# Patient Record
Sex: Female | Born: 1960 | Race: White | Hispanic: No | Smoking: Current every day smoker
Health system: Southern US, Community
[De-identification: ages and names within clinical notes are randomized; demographics above are authoritative.]

## PROBLEM LIST (undated history)

## (undated) DIAGNOSIS — F329 Major depressive disorder, single episode, unspecified: Secondary | ICD-10-CM

## (undated) DIAGNOSIS — G8929 Other chronic pain: Secondary | ICD-10-CM

## (undated) DIAGNOSIS — M549 Dorsalgia, unspecified: Secondary | ICD-10-CM

## (undated) DIAGNOSIS — M810 Age-related osteoporosis without current pathological fracture: Secondary | ICD-10-CM

## (undated) DIAGNOSIS — C539 Malignant neoplasm of cervix uteri, unspecified: Secondary | ICD-10-CM

## (undated) DIAGNOSIS — F32A Depression, unspecified: Secondary | ICD-10-CM

## (undated) DIAGNOSIS — F419 Anxiety disorder, unspecified: Secondary | ICD-10-CM

## (undated) DIAGNOSIS — J387 Other diseases of larynx: Secondary | ICD-10-CM

## (undated) DIAGNOSIS — M797 Fibromyalgia: Secondary | ICD-10-CM

## (undated) DIAGNOSIS — K219 Gastro-esophageal reflux disease without esophagitis: Secondary | ICD-10-CM

## (undated) DIAGNOSIS — K649 Unspecified hemorrhoids: Secondary | ICD-10-CM

## (undated) DIAGNOSIS — M1990 Unspecified osteoarthritis, unspecified site: Secondary | ICD-10-CM

## (undated) DIAGNOSIS — R51 Headache: Secondary | ICD-10-CM

## (undated) DIAGNOSIS — J449 Chronic obstructive pulmonary disease, unspecified: Secondary | ICD-10-CM

## (undated) HISTORY — DX: Unspecified hemorrhoids: K64.9

## (undated) HISTORY — DX: Anxiety disorder, unspecified: F41.9

## (undated) HISTORY — PX: ADENOIDECTOMY: SUR15

## (undated) HISTORY — DX: Malignant neoplasm of cervix uteri, unspecified: C53.9

## (undated) HISTORY — DX: Depression, unspecified: F32.A

## (undated) HISTORY — DX: Chronic obstructive pulmonary disease, unspecified: J44.9

## (undated) HISTORY — DX: Unspecified osteoarthritis, unspecified site: M19.90

## (undated) HISTORY — DX: Other chronic pain: G89.29

## (undated) HISTORY — PX: TONSILLECTOMY: SUR1361

## (undated) HISTORY — DX: Headache: R51

## (undated) HISTORY — DX: Major depressive disorder, single episode, unspecified: F32.9

## (undated) HISTORY — PX: CATARACT EXTRACTION W/ INTRAOCULAR LENS  IMPLANT, BILATERAL: SHX1307

## (undated) HISTORY — DX: Age-related osteoporosis without current pathological fracture: M81.0

## (undated) HISTORY — PX: CERVICAL CONE BIOPSY: SUR198

## (undated) HISTORY — DX: Gastro-esophageal reflux disease without esophagitis: K21.9

## (undated) HISTORY — DX: Dorsalgia, unspecified: M54.9

---

## 1998-01-07 ENCOUNTER — Encounter: Admission: RE | Admit: 1998-01-07 | Discharge: 1998-01-07 | Payer: Self-pay | Admitting: Internal Medicine

## 1998-01-21 ENCOUNTER — Encounter: Admission: RE | Admit: 1998-01-21 | Discharge: 1998-01-21 | Payer: Self-pay | Admitting: Internal Medicine

## 1998-02-06 ENCOUNTER — Encounter: Admission: RE | Admit: 1998-02-06 | Discharge: 1998-02-06 | Payer: Self-pay | Admitting: Internal Medicine

## 1998-03-03 ENCOUNTER — Encounter: Admission: RE | Admit: 1998-03-03 | Discharge: 1998-03-03 | Payer: Self-pay | Admitting: Internal Medicine

## 1998-03-28 ENCOUNTER — Emergency Department (HOSPITAL_COMMUNITY): Admission: EM | Admit: 1998-03-28 | Discharge: 1998-03-28 | Payer: Self-pay | Admitting: Emergency Medicine

## 1998-10-19 ENCOUNTER — Encounter: Admission: RE | Admit: 1998-10-19 | Discharge: 1998-10-19 | Payer: Self-pay | Admitting: Internal Medicine

## 1999-01-20 ENCOUNTER — Encounter: Admission: RE | Admit: 1999-01-20 | Discharge: 1999-01-20 | Payer: Self-pay | Admitting: Hematology and Oncology

## 1999-03-23 ENCOUNTER — Encounter: Admission: RE | Admit: 1999-03-23 | Discharge: 1999-03-23 | Payer: Self-pay | Admitting: Internal Medicine

## 1999-04-08 ENCOUNTER — Encounter: Admission: RE | Admit: 1999-04-08 | Discharge: 1999-04-08 | Payer: Self-pay | Admitting: Internal Medicine

## 1999-08-05 ENCOUNTER — Encounter: Admission: RE | Admit: 1999-08-05 | Discharge: 1999-08-05 | Payer: Self-pay | Admitting: Hematology and Oncology

## 1999-08-12 ENCOUNTER — Encounter: Admission: RE | Admit: 1999-08-12 | Discharge: 1999-08-12 | Payer: Self-pay | Admitting: Hematology and Oncology

## 1999-08-13 ENCOUNTER — Inpatient Hospital Stay (HOSPITAL_COMMUNITY): Admission: AD | Admit: 1999-08-13 | Discharge: 1999-08-14 | Payer: Self-pay | Admitting: Internal Medicine

## 1999-08-13 ENCOUNTER — Encounter: Payer: Self-pay | Admitting: Internal Medicine

## 1999-08-14 ENCOUNTER — Encounter: Payer: Self-pay | Admitting: Internal Medicine

## 1999-08-20 ENCOUNTER — Encounter: Admission: RE | Admit: 1999-08-20 | Discharge: 1999-08-20 | Payer: Self-pay | Admitting: Internal Medicine

## 1999-09-07 ENCOUNTER — Encounter: Admission: RE | Admit: 1999-09-07 | Discharge: 1999-09-07 | Payer: Self-pay | Admitting: Internal Medicine

## 1999-09-14 ENCOUNTER — Encounter: Admission: RE | Admit: 1999-09-14 | Discharge: 1999-09-14 | Payer: Self-pay | Admitting: Internal Medicine

## 1999-11-05 ENCOUNTER — Encounter: Admission: RE | Admit: 1999-11-05 | Discharge: 1999-11-05 | Payer: Self-pay | Admitting: Internal Medicine

## 1999-12-14 ENCOUNTER — Emergency Department (HOSPITAL_COMMUNITY): Admission: EM | Admit: 1999-12-14 | Discharge: 1999-12-14 | Payer: Self-pay | Admitting: *Deleted

## 1999-12-15 ENCOUNTER — Encounter: Admission: RE | Admit: 1999-12-15 | Discharge: 1999-12-15 | Payer: Self-pay | Admitting: Internal Medicine

## 1999-12-25 ENCOUNTER — Encounter: Payer: Self-pay | Admitting: Emergency Medicine

## 1999-12-25 ENCOUNTER — Emergency Department (HOSPITAL_COMMUNITY): Admission: EM | Admit: 1999-12-25 | Discharge: 1999-12-25 | Payer: Self-pay | Admitting: Emergency Medicine

## 2000-01-03 ENCOUNTER — Encounter: Payer: Self-pay | Admitting: Internal Medicine

## 2000-01-03 ENCOUNTER — Inpatient Hospital Stay (HOSPITAL_COMMUNITY): Admission: EM | Admit: 2000-01-03 | Discharge: 2000-01-04 | Payer: Self-pay | Admitting: Emergency Medicine

## 2000-01-04 ENCOUNTER — Encounter: Payer: Self-pay | Admitting: Internal Medicine

## 2000-02-07 ENCOUNTER — Encounter: Admission: RE | Admit: 2000-02-07 | Discharge: 2000-02-07 | Payer: Self-pay | Admitting: Internal Medicine

## 2000-03-14 ENCOUNTER — Encounter: Payer: Self-pay | Admitting: Emergency Medicine

## 2000-03-14 ENCOUNTER — Emergency Department (HOSPITAL_COMMUNITY): Admission: EM | Admit: 2000-03-14 | Discharge: 2000-03-14 | Payer: Self-pay | Admitting: Emergency Medicine

## 2000-04-10 ENCOUNTER — Encounter: Admission: RE | Admit: 2000-04-10 | Discharge: 2000-04-10 | Payer: Self-pay | Admitting: Internal Medicine

## 2000-05-17 ENCOUNTER — Emergency Department (HOSPITAL_COMMUNITY): Admission: EM | Admit: 2000-05-17 | Discharge: 2000-05-17 | Payer: Self-pay | Admitting: Emergency Medicine

## 2000-05-25 ENCOUNTER — Encounter: Admission: RE | Admit: 2000-05-25 | Discharge: 2000-05-25 | Payer: Self-pay

## 2000-07-03 ENCOUNTER — Encounter: Admission: RE | Admit: 2000-07-03 | Discharge: 2000-07-03 | Payer: Self-pay | Admitting: Internal Medicine

## 2000-07-10 ENCOUNTER — Encounter: Admission: RE | Admit: 2000-07-10 | Discharge: 2000-07-10 | Payer: Self-pay | Admitting: Internal Medicine

## 2000-07-28 ENCOUNTER — Emergency Department (HOSPITAL_COMMUNITY): Admission: EM | Admit: 2000-07-28 | Discharge: 2000-07-28 | Payer: Self-pay | Admitting: Emergency Medicine

## 2000-08-14 ENCOUNTER — Encounter: Admission: RE | Admit: 2000-08-14 | Discharge: 2000-08-14 | Payer: Self-pay | Admitting: Internal Medicine

## 2000-09-09 ENCOUNTER — Emergency Department (HOSPITAL_COMMUNITY): Admission: EM | Admit: 2000-09-09 | Discharge: 2000-09-09 | Payer: Self-pay | Admitting: Emergency Medicine

## 2000-09-18 ENCOUNTER — Encounter: Admission: RE | Admit: 2000-09-18 | Discharge: 2000-09-18 | Payer: Self-pay

## 2000-09-20 ENCOUNTER — Encounter: Admission: RE | Admit: 2000-09-20 | Discharge: 2000-09-27 | Payer: Self-pay | Admitting: Infectious Diseases

## 2000-09-28 ENCOUNTER — Encounter: Admission: RE | Admit: 2000-09-28 | Discharge: 2000-09-28 | Payer: Self-pay | Admitting: Hematology and Oncology

## 2000-10-05 ENCOUNTER — Encounter: Admission: RE | Admit: 2000-10-05 | Discharge: 2000-10-05 | Payer: Self-pay | Admitting: Hematology and Oncology

## 2000-11-20 ENCOUNTER — Emergency Department (HOSPITAL_COMMUNITY): Admission: EM | Admit: 2000-11-20 | Discharge: 2000-11-20 | Payer: Self-pay | Admitting: Emergency Medicine

## 2000-12-04 ENCOUNTER — Encounter: Admission: RE | Admit: 2000-12-04 | Discharge: 2000-12-04 | Payer: Self-pay | Admitting: Internal Medicine

## 2000-12-21 ENCOUNTER — Encounter: Admission: RE | Admit: 2000-12-21 | Discharge: 2000-12-21 | Payer: Self-pay | Admitting: Internal Medicine

## 2001-04-05 ENCOUNTER — Encounter: Admission: RE | Admit: 2001-04-05 | Discharge: 2001-04-05 | Payer: Self-pay | Admitting: Internal Medicine

## 2001-05-09 ENCOUNTER — Encounter: Admission: RE | Admit: 2001-05-09 | Discharge: 2001-05-09 | Payer: Self-pay | Admitting: Internal Medicine

## 2001-07-12 ENCOUNTER — Encounter: Admission: RE | Admit: 2001-07-12 | Discharge: 2001-07-12 | Payer: Self-pay | Admitting: Internal Medicine

## 2001-08-17 ENCOUNTER — Encounter: Admission: RE | Admit: 2001-08-17 | Discharge: 2001-08-17 | Payer: Self-pay | Admitting: Internal Medicine

## 2001-09-10 ENCOUNTER — Encounter: Admission: RE | Admit: 2001-09-10 | Discharge: 2001-09-10 | Payer: Self-pay | Admitting: Internal Medicine

## 2001-09-11 ENCOUNTER — Encounter: Admission: RE | Admit: 2001-09-11 | Discharge: 2001-09-11 | Payer: Self-pay | Admitting: Internal Medicine

## 2001-10-01 ENCOUNTER — Encounter: Admission: RE | Admit: 2001-10-01 | Discharge: 2001-10-01 | Payer: Self-pay | Admitting: Internal Medicine

## 2001-10-08 ENCOUNTER — Encounter: Admission: RE | Admit: 2001-10-08 | Discharge: 2001-10-08 | Payer: Self-pay

## 2001-11-15 ENCOUNTER — Encounter: Admission: RE | Admit: 2001-11-15 | Discharge: 2001-11-15 | Payer: Self-pay

## 2002-01-04 ENCOUNTER — Encounter: Admission: RE | Admit: 2002-01-04 | Discharge: 2002-01-04 | Payer: Self-pay | Admitting: Internal Medicine

## 2002-02-04 ENCOUNTER — Encounter: Admission: RE | Admit: 2002-02-04 | Discharge: 2002-02-04 | Payer: Self-pay | Admitting: Internal Medicine

## 2002-03-04 ENCOUNTER — Encounter: Admission: RE | Admit: 2002-03-04 | Discharge: 2002-03-04 | Payer: Self-pay | Admitting: Internal Medicine

## 2002-03-04 ENCOUNTER — Ambulatory Visit (HOSPITAL_COMMUNITY): Admission: RE | Admit: 2002-03-04 | Discharge: 2002-03-04 | Payer: Self-pay | Admitting: Internal Medicine

## 2002-04-04 ENCOUNTER — Emergency Department (HOSPITAL_COMMUNITY): Admission: EM | Admit: 2002-04-04 | Discharge: 2002-04-05 | Payer: Self-pay | Admitting: Emergency Medicine

## 2002-04-10 ENCOUNTER — Encounter: Admission: RE | Admit: 2002-04-10 | Discharge: 2002-04-10 | Payer: Self-pay | Admitting: Internal Medicine

## 2002-05-01 ENCOUNTER — Encounter: Admission: RE | Admit: 2002-05-01 | Discharge: 2002-05-01 | Payer: Self-pay | Admitting: Internal Medicine

## 2002-06-24 ENCOUNTER — Encounter: Admission: RE | Admit: 2002-06-24 | Discharge: 2002-06-24 | Payer: Self-pay | Admitting: Internal Medicine

## 2002-08-09 ENCOUNTER — Encounter: Admission: RE | Admit: 2002-08-09 | Discharge: 2002-08-09 | Payer: Self-pay | Admitting: Internal Medicine

## 2002-08-29 ENCOUNTER — Emergency Department (HOSPITAL_COMMUNITY): Admission: EM | Admit: 2002-08-29 | Discharge: 2002-08-30 | Payer: Self-pay | Admitting: Emergency Medicine

## 2002-09-03 ENCOUNTER — Encounter: Admission: RE | Admit: 2002-09-03 | Discharge: 2002-09-03 | Payer: Self-pay | Admitting: Internal Medicine

## 2002-11-19 ENCOUNTER — Emergency Department (HOSPITAL_COMMUNITY): Admission: EM | Admit: 2002-11-19 | Discharge: 2002-11-19 | Payer: Self-pay | Admitting: *Deleted

## 2002-11-19 ENCOUNTER — Encounter: Payer: Self-pay | Admitting: *Deleted

## 2002-11-19 ENCOUNTER — Encounter: Payer: Self-pay | Admitting: Emergency Medicine

## 2003-01-10 ENCOUNTER — Ambulatory Visit (HOSPITAL_COMMUNITY): Admission: RE | Admit: 2003-01-10 | Discharge: 2003-01-10 | Payer: Self-pay | Admitting: Internal Medicine

## 2003-01-10 ENCOUNTER — Encounter: Admission: RE | Admit: 2003-01-10 | Discharge: 2003-01-10 | Payer: Self-pay | Admitting: Internal Medicine

## 2003-01-10 ENCOUNTER — Encounter: Payer: Self-pay | Admitting: Internal Medicine

## 2003-02-14 ENCOUNTER — Emergency Department (HOSPITAL_COMMUNITY): Admission: EM | Admit: 2003-02-14 | Discharge: 2003-02-14 | Payer: Self-pay | Admitting: Emergency Medicine

## 2003-02-14 ENCOUNTER — Encounter: Payer: Self-pay | Admitting: Emergency Medicine

## 2003-04-02 ENCOUNTER — Encounter: Admission: RE | Admit: 2003-04-02 | Discharge: 2003-04-02 | Payer: Self-pay | Admitting: Internal Medicine

## 2003-04-10 ENCOUNTER — Encounter: Admission: RE | Admit: 2003-04-10 | Discharge: 2003-04-10 | Payer: Self-pay | Admitting: Internal Medicine

## 2003-04-29 ENCOUNTER — Encounter: Admission: RE | Admit: 2003-04-29 | Discharge: 2003-04-29 | Payer: Self-pay | Admitting: Internal Medicine

## 2003-05-08 ENCOUNTER — Encounter: Admission: RE | Admit: 2003-05-08 | Discharge: 2003-05-08 | Payer: Self-pay | Admitting: Internal Medicine

## 2003-06-04 ENCOUNTER — Encounter: Admission: RE | Admit: 2003-06-04 | Discharge: 2003-06-04 | Payer: Self-pay | Admitting: Internal Medicine

## 2003-07-17 ENCOUNTER — Encounter: Admission: RE | Admit: 2003-07-17 | Discharge: 2003-07-17 | Payer: Self-pay | Admitting: Internal Medicine

## 2003-08-18 ENCOUNTER — Encounter: Admission: RE | Admit: 2003-08-18 | Discharge: 2003-08-18 | Payer: Self-pay | Admitting: Internal Medicine

## 2003-09-06 ENCOUNTER — Emergency Department (HOSPITAL_COMMUNITY): Admission: EM | Admit: 2003-09-06 | Discharge: 2003-09-06 | Payer: Self-pay | Admitting: Emergency Medicine

## 2003-10-12 ENCOUNTER — Emergency Department (HOSPITAL_COMMUNITY): Admission: AD | Admit: 2003-10-12 | Discharge: 2003-10-12 | Payer: Self-pay | Admitting: Family Medicine

## 2003-10-14 ENCOUNTER — Encounter: Admission: RE | Admit: 2003-10-14 | Discharge: 2003-10-14 | Payer: Self-pay | Admitting: Internal Medicine

## 2003-11-13 ENCOUNTER — Inpatient Hospital Stay (HOSPITAL_COMMUNITY): Admission: EM | Admit: 2003-11-13 | Discharge: 2003-11-14 | Payer: Self-pay | Admitting: Family Medicine

## 2003-12-11 ENCOUNTER — Encounter: Admission: RE | Admit: 2003-12-11 | Discharge: 2003-12-11 | Payer: Self-pay | Admitting: Internal Medicine

## 2004-01-07 ENCOUNTER — Encounter: Admission: RE | Admit: 2004-01-07 | Discharge: 2004-01-07 | Payer: Self-pay | Admitting: Internal Medicine

## 2004-03-16 ENCOUNTER — Encounter: Admission: RE | Admit: 2004-03-16 | Discharge: 2004-03-16 | Payer: Self-pay | Admitting: Internal Medicine

## 2004-03-16 ENCOUNTER — Ambulatory Visit: Payer: Self-pay | Admitting: Internal Medicine

## 2004-05-25 ENCOUNTER — Ambulatory Visit: Payer: Self-pay | Admitting: Internal Medicine

## 2004-07-10 ENCOUNTER — Emergency Department (HOSPITAL_COMMUNITY): Admission: EM | Admit: 2004-07-10 | Discharge: 2004-07-10 | Payer: Self-pay | Admitting: Family Medicine

## 2004-08-10 ENCOUNTER — Inpatient Hospital Stay (HOSPITAL_COMMUNITY): Admission: AD | Admit: 2004-08-10 | Discharge: 2004-08-12 | Payer: Self-pay | Admitting: Internal Medicine

## 2004-08-10 ENCOUNTER — Ambulatory Visit: Payer: Self-pay | Admitting: Internal Medicine

## 2004-08-23 ENCOUNTER — Ambulatory Visit: Payer: Self-pay | Admitting: Internal Medicine

## 2004-08-30 ENCOUNTER — Ambulatory Visit: Payer: Self-pay | Admitting: Internal Medicine

## 2004-11-16 ENCOUNTER — Ambulatory Visit: Payer: Self-pay | Admitting: Internal Medicine

## 2004-11-25 ENCOUNTER — Ambulatory Visit: Payer: Self-pay | Admitting: Internal Medicine

## 2004-12-18 ENCOUNTER — Emergency Department (HOSPITAL_COMMUNITY): Admission: EM | Admit: 2004-12-18 | Discharge: 2004-12-18 | Payer: Self-pay | Admitting: Family Medicine

## 2004-12-19 ENCOUNTER — Emergency Department (HOSPITAL_COMMUNITY): Admission: EM | Admit: 2004-12-19 | Discharge: 2004-12-19 | Payer: Self-pay | Admitting: Family Medicine

## 2005-01-04 ENCOUNTER — Ambulatory Visit: Payer: Self-pay | Admitting: Internal Medicine

## 2005-02-11 ENCOUNTER — Ambulatory Visit: Payer: Self-pay | Admitting: Internal Medicine

## 2005-02-21 ENCOUNTER — Ambulatory Visit: Payer: Self-pay | Admitting: Internal Medicine

## 2005-03-10 ENCOUNTER — Ambulatory Visit: Payer: Self-pay | Admitting: Internal Medicine

## 2005-03-25 ENCOUNTER — Ambulatory Visit: Payer: Self-pay | Admitting: Internal Medicine

## 2005-04-12 ENCOUNTER — Ambulatory Visit: Payer: Self-pay | Admitting: Internal Medicine

## 2005-05-23 ENCOUNTER — Ambulatory Visit: Payer: Self-pay | Admitting: Internal Medicine

## 2005-05-23 ENCOUNTER — Ambulatory Visit (HOSPITAL_COMMUNITY): Admission: RE | Admit: 2005-05-23 | Discharge: 2005-05-23 | Payer: Self-pay | Admitting: Internal Medicine

## 2005-06-03 ENCOUNTER — Encounter: Payer: Self-pay | Admitting: Cardiology

## 2005-06-03 ENCOUNTER — Ambulatory Visit (HOSPITAL_COMMUNITY): Admission: RE | Admit: 2005-06-03 | Discharge: 2005-06-03 | Payer: Self-pay | Admitting: Internal Medicine

## 2005-06-03 ENCOUNTER — Ambulatory Visit: Payer: Self-pay | Admitting: Cardiology

## 2005-06-10 ENCOUNTER — Ambulatory Visit: Payer: Self-pay | Admitting: Internal Medicine

## 2005-06-27 ENCOUNTER — Ambulatory Visit: Payer: Self-pay | Admitting: Internal Medicine

## 2005-08-01 ENCOUNTER — Emergency Department (HOSPITAL_COMMUNITY): Admission: EM | Admit: 2005-08-01 | Discharge: 2005-08-01 | Payer: Self-pay | Admitting: Family Medicine

## 2005-10-02 ENCOUNTER — Emergency Department (HOSPITAL_COMMUNITY): Admission: AD | Admit: 2005-10-02 | Discharge: 2005-10-02 | Payer: Self-pay | Admitting: Family Medicine

## 2005-10-04 ENCOUNTER — Emergency Department (HOSPITAL_COMMUNITY): Admission: EM | Admit: 2005-10-04 | Discharge: 2005-10-04 | Payer: Self-pay | Admitting: Family Medicine

## 2005-10-11 ENCOUNTER — Ambulatory Visit: Payer: Self-pay | Admitting: Hospitalist

## 2006-03-28 ENCOUNTER — Other Ambulatory Visit: Admission: RE | Admit: 2006-03-28 | Discharge: 2006-03-28 | Payer: Self-pay | Admitting: Obstetrics and Gynecology

## 2006-04-05 ENCOUNTER — Encounter: Admission: RE | Admit: 2006-04-05 | Discharge: 2006-04-05 | Payer: Self-pay | Admitting: Obstetrics and Gynecology

## 2006-04-12 ENCOUNTER — Encounter: Admission: RE | Admit: 2006-04-12 | Discharge: 2006-04-12 | Payer: Self-pay | Admitting: Obstetrics and Gynecology

## 2006-06-09 ENCOUNTER — Ambulatory Visit (HOSPITAL_COMMUNITY): Admission: RE | Admit: 2006-06-09 | Discharge: 2006-06-09 | Payer: Self-pay | Admitting: Obstetrics and Gynecology

## 2006-06-09 ENCOUNTER — Encounter (INDEPENDENT_AMBULATORY_CARE_PROVIDER_SITE_OTHER): Payer: Self-pay | Admitting: Specialist

## 2006-08-15 ENCOUNTER — Emergency Department (HOSPITAL_COMMUNITY): Admission: EM | Admit: 2006-08-15 | Discharge: 2006-08-15 | Payer: Self-pay | Admitting: Emergency Medicine

## 2006-10-11 ENCOUNTER — Encounter: Admission: RE | Admit: 2006-10-11 | Discharge: 2006-10-11 | Payer: Self-pay | Admitting: Obstetrics and Gynecology

## 2006-11-09 ENCOUNTER — Emergency Department (HOSPITAL_COMMUNITY): Admission: EM | Admit: 2006-11-09 | Discharge: 2006-11-09 | Payer: Self-pay | Admitting: Family Medicine

## 2006-11-10 ENCOUNTER — Emergency Department (HOSPITAL_COMMUNITY): Admission: EM | Admit: 2006-11-10 | Discharge: 2006-11-10 | Payer: Self-pay | Admitting: Emergency Medicine

## 2006-12-01 ENCOUNTER — Emergency Department (HOSPITAL_COMMUNITY): Admission: EM | Admit: 2006-12-01 | Discharge: 2006-12-01 | Payer: Self-pay | Admitting: Emergency Medicine

## 2007-01-25 ENCOUNTER — Emergency Department (HOSPITAL_COMMUNITY): Admission: EM | Admit: 2007-01-25 | Discharge: 2007-01-25 | Payer: Self-pay | Admitting: Emergency Medicine

## 2007-02-27 ENCOUNTER — Encounter: Payer: Self-pay | Admitting: Internal Medicine

## 2007-02-27 ENCOUNTER — Ambulatory Visit: Payer: Self-pay | Admitting: Hospitalist

## 2007-02-27 DIAGNOSIS — J449 Chronic obstructive pulmonary disease, unspecified: Secondary | ICD-10-CM | POA: Insufficient documentation

## 2007-02-27 DIAGNOSIS — R51 Headache: Secondary | ICD-10-CM

## 2007-02-27 DIAGNOSIS — Z9189 Other specified personal risk factors, not elsewhere classified: Secondary | ICD-10-CM

## 2007-02-27 DIAGNOSIS — R5383 Other fatigue: Secondary | ICD-10-CM

## 2007-02-27 DIAGNOSIS — F418 Other specified anxiety disorders: Secondary | ICD-10-CM

## 2007-02-27 DIAGNOSIS — R519 Headache, unspecified: Secondary | ICD-10-CM | POA: Insufficient documentation

## 2007-02-27 DIAGNOSIS — R5381 Other malaise: Secondary | ICD-10-CM | POA: Insufficient documentation

## 2007-02-27 LAB — CONVERTED CEMR LAB
Basophils Relative: 1 % (ref 0–1)
Eosinophils Absolute: 0.1 10*3/uL (ref 0.0–0.7)
MCHC: 33.6 g/dL (ref 30.0–36.0)
MCV: 96.2 fL (ref 78.0–100.0)
Neutrophils Relative %: 53 % (ref 43–77)
Platelets: 287 10*3/uL (ref 150–400)
RBC Folate: 529 ng/mL (ref 180–600)
RDW: 12.9 % (ref 11.5–14.0)

## 2007-02-28 ENCOUNTER — Ambulatory Visit: Payer: Self-pay | Admitting: Hospitalist

## 2007-02-28 ENCOUNTER — Ambulatory Visit (HOSPITAL_COMMUNITY): Admission: RE | Admit: 2007-02-28 | Discharge: 2007-02-28 | Payer: Self-pay | Admitting: *Deleted

## 2007-02-28 ENCOUNTER — Encounter: Payer: Self-pay | Admitting: Internal Medicine

## 2007-02-28 DIAGNOSIS — E569 Vitamin deficiency, unspecified: Secondary | ICD-10-CM

## 2007-02-28 DIAGNOSIS — Z860100 Personal history of colon polyps, unspecified: Secondary | ICD-10-CM | POA: Insufficient documentation

## 2007-02-28 DIAGNOSIS — D509 Iron deficiency anemia, unspecified: Secondary | ICD-10-CM

## 2007-02-28 LAB — CONVERTED CEMR LAB
AST: 14 units/L (ref 0–37)
Albumin: 4.6 g/dL (ref 3.5–5.2)
BUN: 15 mg/dL (ref 6–23)
Bilirubin Urine: NEGATIVE
CO2: 25 meq/L (ref 19–32)
Calcium: 9.5 mg/dL (ref 8.4–10.5)
Chloride: 106 meq/L (ref 96–112)
Cholesterol: 190 mg/dL (ref 0–200)
HDL: 58 mg/dL (ref >39)
Nitrite: NEGATIVE
Potassium: 5.1 meq/L (ref 3.5–5.3)
Specific Gravity, Urine: 1.02 (ref 1.005–1.03)
TSH: 1.258 microintl units/mL (ref 0.350–5.50)
Urobilinogen, UA: 1 (ref 0.0–1.0)
Vitamin B-12: 268 pg/mL (ref 211–911)

## 2007-03-02 LAB — CONVERTED CEMR LAB: Homocysteine: 10.1 micromoles/L (ref 4.0–15.4)

## 2007-03-05 LAB — CONVERTED CEMR LAB: OCCULT 1: NEGATIVE

## 2007-03-07 ENCOUNTER — Ambulatory Visit: Payer: Self-pay | Admitting: Internal Medicine

## 2007-03-09 ENCOUNTER — Ambulatory Visit: Payer: Self-pay | Admitting: Internal Medicine

## 2007-03-14 ENCOUNTER — Telehealth (INDEPENDENT_AMBULATORY_CARE_PROVIDER_SITE_OTHER): Payer: Self-pay | Admitting: Pharmacy Technician

## 2007-03-14 ENCOUNTER — Ambulatory Visit (HOSPITAL_COMMUNITY): Admission: RE | Admit: 2007-03-14 | Discharge: 2007-03-14 | Payer: Self-pay | Admitting: Internal Medicine

## 2007-03-20 ENCOUNTER — Encounter: Payer: Self-pay | Admitting: Internal Medicine

## 2007-03-27 ENCOUNTER — Telehealth: Payer: Self-pay | Admitting: *Deleted

## 2007-04-05 ENCOUNTER — Ambulatory Visit: Payer: Self-pay | Admitting: Internal Medicine

## 2007-05-15 ENCOUNTER — Telehealth (INDEPENDENT_AMBULATORY_CARE_PROVIDER_SITE_OTHER): Payer: Self-pay | Admitting: *Deleted

## 2007-06-21 ENCOUNTER — Encounter (INDEPENDENT_AMBULATORY_CARE_PROVIDER_SITE_OTHER): Payer: Self-pay | Admitting: Internal Medicine

## 2007-06-21 ENCOUNTER — Ambulatory Visit: Payer: Self-pay | Admitting: Internal Medicine

## 2007-06-22 LAB — CONVERTED CEMR LAB
HCT: 44.8 % (ref 36.0–46.0)
MCV: 98.7 fL (ref 78.0–100.0)
RBC: 4.54 M/uL (ref 3.87–5.11)
Sed Rate: 16 mm/hr (ref 0–22)
WBC: 6.9 10*3/uL (ref 4.0–10.5)

## 2007-07-16 ENCOUNTER — Ambulatory Visit: Payer: Self-pay | Admitting: Internal Medicine

## 2007-07-19 ENCOUNTER — Ambulatory Visit (HOSPITAL_COMMUNITY): Admission: RE | Admit: 2007-07-19 | Discharge: 2007-07-19 | Payer: Self-pay | Admitting: Infectious Diseases

## 2007-07-19 ENCOUNTER — Telehealth: Payer: Self-pay | Admitting: *Deleted

## 2007-07-19 ENCOUNTER — Ambulatory Visit: Payer: Self-pay | Admitting: Hospitalist

## 2007-08-10 ENCOUNTER — Ambulatory Visit: Payer: Self-pay | Admitting: Internal Medicine

## 2007-08-14 ENCOUNTER — Ambulatory Visit (HOSPITAL_COMMUNITY): Admission: RE | Admit: 2007-08-14 | Discharge: 2007-08-14 | Payer: Self-pay | Admitting: Internal Medicine

## 2007-08-15 ENCOUNTER — Encounter (INDEPENDENT_AMBULATORY_CARE_PROVIDER_SITE_OTHER): Payer: Self-pay | Admitting: Internal Medicine

## 2007-08-15 ENCOUNTER — Ambulatory Visit (HOSPITAL_COMMUNITY): Admission: RE | Admit: 2007-08-15 | Discharge: 2007-08-15 | Payer: Self-pay | Admitting: Internal Medicine

## 2007-08-17 ENCOUNTER — Ambulatory Visit: Payer: Self-pay | Admitting: Internal Medicine

## 2007-09-21 ENCOUNTER — Telehealth: Payer: Self-pay | Admitting: *Deleted

## 2007-10-17 ENCOUNTER — Telehealth (INDEPENDENT_AMBULATORY_CARE_PROVIDER_SITE_OTHER): Payer: Self-pay | Admitting: Internal Medicine

## 2007-11-01 ENCOUNTER — Ambulatory Visit: Payer: Self-pay | Admitting: Internal Medicine

## 2007-11-01 ENCOUNTER — Encounter (INDEPENDENT_AMBULATORY_CARE_PROVIDER_SITE_OTHER): Payer: Self-pay | Admitting: Internal Medicine

## 2007-11-01 DIAGNOSIS — IMO0001 Reserved for inherently not codable concepts without codable children: Secondary | ICD-10-CM

## 2007-11-01 LAB — CONVERTED CEMR LAB
ALT: 10 units/L (ref 0–35)
Albumin: 4.5 g/dL (ref 3.5–5.2)
Alkaline Phosphatase: 77 units/L (ref 39–117)
Glucose, Bld: 82 mg/dL (ref 70–99)
Potassium: 4.9 meq/L (ref 3.5–5.3)
Sodium: 145 meq/L (ref 135–145)
Total Protein: 7.3 g/dL (ref 6.0–8.3)

## 2007-11-06 ENCOUNTER — Telehealth: Payer: Self-pay | Admitting: *Deleted

## 2007-11-16 ENCOUNTER — Ambulatory Visit: Payer: Self-pay | Admitting: Internal Medicine

## 2007-11-16 ENCOUNTER — Encounter: Payer: Self-pay | Admitting: *Deleted

## 2007-11-16 ENCOUNTER — Encounter (INDEPENDENT_AMBULATORY_CARE_PROVIDER_SITE_OTHER): Payer: Self-pay | Admitting: Internal Medicine

## 2007-11-16 ENCOUNTER — Telehealth: Payer: Self-pay | Admitting: *Deleted

## 2007-11-16 ENCOUNTER — Encounter: Admission: RE | Admit: 2007-11-16 | Discharge: 2007-11-16 | Payer: Self-pay | Admitting: Internal Medicine

## 2007-11-19 LAB — CONVERTED CEMR LAB
Cholesterol: 212 mg/dL — ABNORMAL HIGH (ref 0–200)
LDL Cholesterol: 118 mg/dL — ABNORMAL HIGH (ref 0–99)
VLDL: 13 mg/dL (ref 0–40)

## 2007-12-11 ENCOUNTER — Emergency Department (HOSPITAL_COMMUNITY): Admission: EM | Admit: 2007-12-11 | Discharge: 2007-12-11 | Payer: Self-pay | Admitting: Emergency Medicine

## 2007-12-13 ENCOUNTER — Ambulatory Visit: Payer: Self-pay | Admitting: Internal Medicine

## 2007-12-13 ENCOUNTER — Encounter (INDEPENDENT_AMBULATORY_CARE_PROVIDER_SITE_OTHER): Payer: Self-pay | Admitting: Internal Medicine

## 2007-12-13 ENCOUNTER — Encounter: Payer: Self-pay | Admitting: Internal Medicine

## 2007-12-14 ENCOUNTER — Encounter: Payer: Self-pay | Admitting: Internal Medicine

## 2007-12-14 LAB — CONVERTED CEMR LAB
Albumin: 4.7 g/dL (ref 3.5–5.2)
BUN: 21 mg/dL (ref 6–23)
CO2: 28 meq/L (ref 19–32)
Calcium: 9.7 mg/dL (ref 8.4–10.5)
Chloride: 105 meq/L (ref 96–112)
Creatinine, Ser: 0.85 mg/dL (ref 0.40–1.20)
Glucose, Bld: 88 mg/dL (ref 70–99)
Hemoglobin: 13.1 g/dL (ref 12.0–15.0)
Lymphocytes Relative: 42 % (ref 12–46)
Lymphs Abs: 5.1 10*3/uL — ABNORMAL HIGH (ref 0.7–4.0)
MCHC: 32.5 g/dL (ref 30.0–36.0)
Monocytes Absolute: 1 10*3/uL (ref 0.1–1.0)
Monocytes Relative: 8 % (ref 3–12)
Neutro Abs: 6 10*3/uL (ref 1.7–7.7)
Potassium: 4.6 meq/L (ref 3.5–5.3)
RBC: 4.12 M/uL (ref 3.87–5.11)
WBC: 12.2 10*3/uL — ABNORMAL HIGH (ref 4.0–10.5)

## 2007-12-18 ENCOUNTER — Telehealth: Payer: Self-pay | Admitting: *Deleted

## 2008-01-04 ENCOUNTER — Ambulatory Visit: Payer: Self-pay | Admitting: Internal Medicine

## 2008-01-04 ENCOUNTER — Encounter (INDEPENDENT_AMBULATORY_CARE_PROVIDER_SITE_OTHER): Payer: Self-pay | Admitting: *Deleted

## 2008-01-12 ENCOUNTER — Emergency Department (HOSPITAL_COMMUNITY): Admission: EM | Admit: 2008-01-12 | Discharge: 2008-01-12 | Payer: Self-pay | Admitting: Family Medicine

## 2008-01-15 ENCOUNTER — Inpatient Hospital Stay (HOSPITAL_COMMUNITY): Admission: EM | Admit: 2008-01-15 | Discharge: 2008-01-19 | Payer: Self-pay | Admitting: Emergency Medicine

## 2008-01-16 ENCOUNTER — Encounter: Payer: Self-pay | Admitting: Internal Medicine

## 2008-01-18 ENCOUNTER — Encounter: Payer: Self-pay | Admitting: Internal Medicine

## 2008-01-24 ENCOUNTER — Ambulatory Visit: Payer: Self-pay | Admitting: Internal Medicine

## 2008-02-08 ENCOUNTER — Ambulatory Visit: Payer: Self-pay | Admitting: *Deleted

## 2008-02-11 ENCOUNTER — Telehealth: Payer: Self-pay | Admitting: Internal Medicine

## 2008-02-19 ENCOUNTER — Encounter: Payer: Self-pay | Admitting: Internal Medicine

## 2008-03-11 ENCOUNTER — Encounter (INDEPENDENT_AMBULATORY_CARE_PROVIDER_SITE_OTHER): Payer: Self-pay | Admitting: *Deleted

## 2008-03-11 ENCOUNTER — Ambulatory Visit: Payer: Self-pay | Admitting: *Deleted

## 2008-03-11 LAB — CONVERTED CEMR LAB
ALT: 11 U/L
AST: 16 U/L
Albumin: 4.3 g/dL
Alkaline Phosphatase: 78 U/L
BUN: 17 mg/dL
Basophils Absolute: 0 K/uL
Basophils Relative: 0 %
Bilirubin Urine: NEGATIVE
Bilirubin Urine: NEGATIVE
Blood in Urine, dipstick: NEGATIVE
CO2: 26 meq/L
Calcium: 9.4 mg/dL
Chloride: 103 meq/L
Creatinine, Ser: 0.78 mg/dL
Eosinophils Absolute: 0.1 K/uL
Eosinophils Relative: 2 %
Ferritin: 96 ng/mL
Free T4: 1.06 ng/dL
Glucose, Bld: 102 mg/dL — ABNORMAL HIGH
Glucose, Urine, Semiquant: NEGATIVE
HCT: 40.4 %
Hemoglobin, Urine: NEGATIVE
Hemoglobin: 12.8 g/dL
Iron: 75 ug/dL
Ketones, ur: NEGATIVE mg/dL
Ketones, urine, test strip: NEGATIVE
Lymphocytes Relative: 39 %
Lymphs Abs: 2.1 K/uL
MCHC: 31.7 g/dL
MCV: 98.3 fL
Monocytes Absolute: 0.5 K/uL
Monocytes Relative: 9 %
Neutro Abs: 2.6 K/uL
Neutrophils Relative %: 50 %
Nitrite: NEGATIVE
Nitrite: NEGATIVE
Platelets: 287 K/uL
Potassium: 4.4 meq/L
Protein, U semiquant: NEGATIVE
Protein, ur: NEGATIVE mg/dL
RBC / HPF: NONE SEEN
RBC: 4.11 M/uL
RDW: 13.7 %
Saturation Ratios: 24 %
Sodium: 140 meq/L
Specific Gravity, Urine: 1.025
Specific Gravity, Urine: 1.022
TIBC: 312 ug/dL
TSH: 1.031 u[IU]/mL
Total Bilirubin: 0.3 mg/dL
Total Protein: 7.1 g/dL
UIBC: 237 ug/dL
Urine Glucose: NEGATIVE mg/dL
Urobilinogen, UA: 0.2
Urobilinogen, UA: 0.2
Vitamin B-12: 349 pg/mL
WBC: 5.3 10*3/microliter
pH: 6
pH: 6

## 2008-03-18 ENCOUNTER — Telehealth: Payer: Self-pay | Admitting: Internal Medicine

## 2008-03-19 ENCOUNTER — Telehealth: Payer: Self-pay | Admitting: Internal Medicine

## 2008-04-18 ENCOUNTER — Telehealth (INDEPENDENT_AMBULATORY_CARE_PROVIDER_SITE_OTHER): Payer: Self-pay | Admitting: Pharmacy Technician

## 2008-04-30 ENCOUNTER — Ambulatory Visit: Payer: Self-pay | Admitting: Internal Medicine

## 2008-04-30 ENCOUNTER — Encounter: Payer: Self-pay | Admitting: Internal Medicine

## 2008-05-07 ENCOUNTER — Telehealth: Payer: Self-pay | Admitting: Internal Medicine

## 2008-05-09 ENCOUNTER — Encounter (INDEPENDENT_AMBULATORY_CARE_PROVIDER_SITE_OTHER): Payer: Self-pay | Admitting: Internal Medicine

## 2008-05-15 ENCOUNTER — Ambulatory Visit: Payer: Self-pay | Admitting: Infectious Disease

## 2008-05-21 ENCOUNTER — Encounter: Payer: Self-pay | Admitting: Licensed Clinical Social Worker

## 2008-06-03 ENCOUNTER — Telehealth: Payer: Self-pay | Admitting: *Deleted

## 2008-06-12 ENCOUNTER — Encounter: Payer: Self-pay | Admitting: Internal Medicine

## 2008-06-28 ENCOUNTER — Emergency Department (HOSPITAL_COMMUNITY): Admission: EM | Admit: 2008-06-28 | Discharge: 2008-06-28 | Payer: Self-pay | Admitting: Emergency Medicine

## 2008-06-29 ENCOUNTER — Emergency Department (HOSPITAL_COMMUNITY): Admission: EM | Admit: 2008-06-29 | Discharge: 2008-06-29 | Payer: Self-pay | Admitting: Family Medicine

## 2008-07-01 ENCOUNTER — Ambulatory Visit: Payer: Self-pay | Admitting: Internal Medicine

## 2008-07-01 DIAGNOSIS — M542 Cervicalgia: Secondary | ICD-10-CM

## 2008-08-11 ENCOUNTER — Ambulatory Visit: Payer: Self-pay | Admitting: Internal Medicine

## 2008-08-11 DIAGNOSIS — G894 Chronic pain syndrome: Secondary | ICD-10-CM

## 2008-08-25 ENCOUNTER — Encounter: Payer: Self-pay | Admitting: Internal Medicine

## 2008-09-08 ENCOUNTER — Telehealth: Payer: Self-pay | Admitting: Internal Medicine

## 2008-09-10 ENCOUNTER — Encounter: Payer: Self-pay | Admitting: Internal Medicine

## 2008-09-17 ENCOUNTER — Ambulatory Visit: Payer: Self-pay | Admitting: Internal Medicine

## 2008-09-29 ENCOUNTER — Encounter: Payer: Self-pay | Admitting: Internal Medicine

## 2008-10-06 ENCOUNTER — Encounter: Payer: Self-pay | Admitting: Internal Medicine

## 2008-10-20 ENCOUNTER — Encounter: Payer: Self-pay | Admitting: Internal Medicine

## 2008-10-27 ENCOUNTER — Ambulatory Visit: Payer: Self-pay | Admitting: Internal Medicine

## 2008-10-27 ENCOUNTER — Encounter: Payer: Self-pay | Admitting: Internal Medicine

## 2008-10-27 DIAGNOSIS — M7989 Other specified soft tissue disorders: Secondary | ICD-10-CM

## 2008-10-27 LAB — CONVERTED CEMR LAB
ALT: <8 units/L (ref 0–35)
CO2: 27 meq/L (ref 19–32)
Calcium: 9.2 mg/dL (ref 8.4–10.5)
Chloride: 104 meq/L (ref 96–112)
Cholesterol: 186 mg/dL (ref 0–200)
Creatinine, Ser: 0.68 mg/dL (ref 0.40–1.20)
GFR calc Af Amer: >60 mL/min (ref >60)
GFR calc non Af Amer: >60 mL/min (ref >60)
Glucose, Bld: 84 mg/dL (ref 70–99)
LDL Cholesterol: 117 mg/dL — ABNORMAL HIGH (ref 0–99)
Rhuematoid fact SerPl-aCnc: <20 intl units/mL (ref 0–20)
Total Bilirubin: 0.3 mg/dL (ref 0.3–1.2)
VLDL: 26 mg/dL (ref 0–40)

## 2008-11-07 ENCOUNTER — Encounter: Payer: Self-pay | Admitting: Internal Medicine

## 2008-12-01 ENCOUNTER — Ambulatory Visit: Payer: Self-pay | Admitting: Internal Medicine

## 2008-12-18 ENCOUNTER — Encounter: Payer: Self-pay | Admitting: Internal Medicine

## 2008-12-23 ENCOUNTER — Emergency Department (HOSPITAL_COMMUNITY): Admission: EM | Admit: 2008-12-23 | Discharge: 2008-12-23 | Payer: Self-pay | Admitting: Family Medicine

## 2008-12-31 ENCOUNTER — Encounter: Payer: Self-pay | Admitting: Internal Medicine

## 2008-12-31 ENCOUNTER — Ambulatory Visit: Payer: Self-pay | Admitting: Internal Medicine

## 2008-12-31 DIAGNOSIS — R928 Other abnormal and inconclusive findings on diagnostic imaging of breast: Secondary | ICD-10-CM

## 2008-12-31 LAB — CONVERTED CEMR LAB
ALT: <8 units/L (ref 0–35)
Albumin: 4.8 g/dL (ref 3.5–5.2)
Alkaline Phosphatase: 71 units/L (ref 39–117)
Basophils Absolute: 0 10*3/uL (ref 0.0–0.1)
Basophils Relative: 0 % (ref 0–1)
Eosinophils Absolute: 0.2 10*3/uL (ref 0.0–0.7)
Eosinophils Relative: 4 % (ref 0–5)
HCT: 43.9 % (ref 36.0–46.0)
Hemoglobin: 14.8 g/dL (ref 12.0–15.0)
MCHC: 33.7 g/dL (ref 30.0–36.0)
MCV: 93.8 fL (ref 78.0–100.0)
Monocytes Absolute: 0.5 10*3/uL (ref 0.1–1.0)
Potassium: 5 meq/L (ref 3.5–5.3)
RDW: 14 % (ref 11.5–15.5)
Sodium: 141 meq/L (ref 135–145)
Total Bilirubin: 0.4 mg/dL (ref 0.3–1.2)
Total Protein: 7.8 g/dL (ref 6.0–8.3)

## 2009-01-07 ENCOUNTER — Encounter: Admission: RE | Admit: 2009-01-07 | Discharge: 2009-01-07 | Payer: Self-pay | Admitting: Internal Medicine

## 2009-01-08 ENCOUNTER — Ambulatory Visit: Payer: Self-pay | Admitting: Internal Medicine

## 2009-01-08 DIAGNOSIS — R1319 Other dysphagia: Secondary | ICD-10-CM

## 2009-01-08 DIAGNOSIS — K219 Gastro-esophageal reflux disease without esophagitis: Secondary | ICD-10-CM | POA: Insufficient documentation

## 2009-01-12 ENCOUNTER — Ambulatory Visit: Payer: Self-pay | Admitting: Internal Medicine

## 2009-03-05 ENCOUNTER — Ambulatory Visit: Payer: Self-pay | Admitting: Internal Medicine

## 2009-03-05 LAB — CONVERTED CEMR LAB
Cyclic Citrullin Peptide Ab: 0.8 units (ref <7)
Sed Rate: 8 mm/hr (ref 0–22)

## 2009-03-18 ENCOUNTER — Telehealth: Payer: Self-pay | Admitting: Internal Medicine

## 2009-03-24 ENCOUNTER — Telehealth: Payer: Self-pay | Admitting: Internal Medicine

## 2009-03-25 ENCOUNTER — Encounter: Payer: Self-pay | Admitting: Internal Medicine

## 2009-04-17 ENCOUNTER — Ambulatory Visit (HOSPITAL_COMMUNITY): Admission: RE | Admit: 2009-04-17 | Discharge: 2009-04-17 | Payer: Self-pay | Admitting: Infectious Diseases

## 2009-04-17 ENCOUNTER — Ambulatory Visit: Payer: Self-pay | Admitting: Infectious Disease

## 2009-04-17 DIAGNOSIS — R05 Cough: Secondary | ICD-10-CM

## 2009-04-17 LAB — CONVERTED CEMR LAB
Eosinophils Relative: 1 % (ref 0–5)
HCT: 42 % (ref 36.0–46.0)
Hemoglobin: 14.2 g/dL (ref 12.0–15.0)
Lymphocytes Relative: 23 % (ref 12–46)
Lymphs Abs: 2.1 10*3/uL (ref 0.7–4.0)
Platelets: 323 10*3/uL (ref 150–400)
RBC: 4.41 M/uL (ref 3.87–5.11)
WBC: 9.4 10*3/uL (ref 4.0–10.5)

## 2009-04-27 ENCOUNTER — Telehealth: Payer: Self-pay | Admitting: *Deleted

## 2009-05-05 ENCOUNTER — Encounter: Payer: Self-pay | Admitting: Internal Medicine

## 2009-05-26 ENCOUNTER — Encounter: Payer: Self-pay | Admitting: Internal Medicine

## 2009-05-26 ENCOUNTER — Telehealth: Payer: Self-pay | Admitting: *Deleted

## 2009-06-04 ENCOUNTER — Encounter (INDEPENDENT_AMBULATORY_CARE_PROVIDER_SITE_OTHER): Payer: Self-pay | Admitting: Dermatology

## 2009-06-04 ENCOUNTER — Telehealth (INDEPENDENT_AMBULATORY_CARE_PROVIDER_SITE_OTHER): Payer: Self-pay | Admitting: *Deleted

## 2009-06-05 ENCOUNTER — Emergency Department (HOSPITAL_COMMUNITY): Admission: EM | Admit: 2009-06-05 | Discharge: 2009-06-05 | Payer: Self-pay | Admitting: Emergency Medicine

## 2009-06-23 ENCOUNTER — Telehealth (INDEPENDENT_AMBULATORY_CARE_PROVIDER_SITE_OTHER): Payer: Self-pay | Admitting: *Deleted

## 2009-06-23 ENCOUNTER — Encounter: Payer: Self-pay | Admitting: Infectious Disease

## 2009-07-01 ENCOUNTER — Telehealth: Payer: Self-pay | Admitting: Internal Medicine

## 2009-07-02 ENCOUNTER — Encounter: Payer: Self-pay | Admitting: Internal Medicine

## 2009-07-02 ENCOUNTER — Telehealth (INDEPENDENT_AMBULATORY_CARE_PROVIDER_SITE_OTHER): Payer: Self-pay | Admitting: *Deleted

## 2009-07-15 ENCOUNTER — Ambulatory Visit: Payer: Self-pay | Admitting: Internal Medicine

## 2009-07-28 ENCOUNTER — Telehealth: Payer: Self-pay | Admitting: *Deleted

## 2009-08-03 ENCOUNTER — Telehealth: Payer: Self-pay | Admitting: *Deleted

## 2009-08-27 ENCOUNTER — Encounter: Payer: Self-pay | Admitting: Internal Medicine

## 2009-09-07 ENCOUNTER — Encounter: Payer: Self-pay | Admitting: Internal Medicine

## 2009-09-10 ENCOUNTER — Ambulatory Visit: Payer: Self-pay | Admitting: Internal Medicine

## 2009-09-10 DIAGNOSIS — L408 Other psoriasis: Secondary | ICD-10-CM

## 2009-09-11 ENCOUNTER — Encounter: Payer: Self-pay | Admitting: Internal Medicine

## 2009-09-18 ENCOUNTER — Ambulatory Visit (HOSPITAL_COMMUNITY): Admission: RE | Admit: 2009-09-18 | Discharge: 2009-09-18 | Payer: Self-pay | Admitting: Internal Medicine

## 2009-09-18 ENCOUNTER — Encounter: Payer: Self-pay | Admitting: Internal Medicine

## 2009-09-28 ENCOUNTER — Encounter: Payer: Self-pay | Admitting: Internal Medicine

## 2009-09-28 DIAGNOSIS — J069 Acute upper respiratory infection, unspecified: Secondary | ICD-10-CM | POA: Insufficient documentation

## 2009-09-30 ENCOUNTER — Ambulatory Visit: Payer: Self-pay | Admitting: Infectious Diseases

## 2009-10-01 ENCOUNTER — Encounter: Payer: Self-pay | Admitting: Internal Medicine

## 2009-10-02 ENCOUNTER — Ambulatory Visit: Payer: Self-pay | Admitting: Internal Medicine

## 2009-10-02 ENCOUNTER — Telehealth: Payer: Self-pay | Admitting: Internal Medicine

## 2009-10-02 ENCOUNTER — Encounter: Payer: Self-pay | Admitting: Internal Medicine

## 2009-10-02 ENCOUNTER — Observation Stay (HOSPITAL_COMMUNITY): Admission: EM | Admit: 2009-10-02 | Discharge: 2009-10-07 | Payer: Self-pay | Admitting: Emergency Medicine

## 2009-10-05 ENCOUNTER — Encounter: Payer: Self-pay | Admitting: Internal Medicine

## 2009-10-08 ENCOUNTER — Telehealth (INDEPENDENT_AMBULATORY_CARE_PROVIDER_SITE_OTHER): Payer: Self-pay | Admitting: Internal Medicine

## 2009-10-08 ENCOUNTER — Ambulatory Visit: Payer: Self-pay | Admitting: Infectious Diseases

## 2009-10-14 ENCOUNTER — Telehealth: Payer: Self-pay | Admitting: Internal Medicine

## 2009-10-19 ENCOUNTER — Telehealth: Payer: Self-pay | Admitting: Internal Medicine

## 2009-10-22 ENCOUNTER — Encounter: Payer: Self-pay | Admitting: Internal Medicine

## 2009-10-28 ENCOUNTER — Ambulatory Visit: Payer: Self-pay | Admitting: Internal Medicine

## 2009-11-26 ENCOUNTER — Ambulatory Visit: Payer: Self-pay | Admitting: Internal Medicine

## 2010-01-14 ENCOUNTER — Telehealth: Payer: Self-pay | Admitting: Internal Medicine

## 2010-01-14 ENCOUNTER — Ambulatory Visit: Payer: Self-pay | Admitting: Internal Medicine

## 2010-01-14 ENCOUNTER — Ambulatory Visit (HOSPITAL_COMMUNITY): Admission: RE | Admit: 2010-01-14 | Discharge: 2010-01-14 | Payer: Self-pay | Admitting: Internal Medicine

## 2010-01-14 DIAGNOSIS — R0602 Shortness of breath: Secondary | ICD-10-CM | POA: Insufficient documentation

## 2010-01-14 DIAGNOSIS — R300 Dysuria: Secondary | ICD-10-CM

## 2010-01-14 DIAGNOSIS — R197 Diarrhea, unspecified: Secondary | ICD-10-CM | POA: Insufficient documentation

## 2010-01-14 LAB — CONVERTED CEMR LAB
ALT: 9 units/L (ref 0–35)
AST: 16 units/L (ref 0–37)
Albumin: 4.8 g/dL (ref 3.5–5.2)
BUN: 12 mg/dL (ref 6–23)
Basophils Absolute: 0 10*3/uL (ref 0.0–0.1)
Calcium: 10 mg/dL (ref 8.4–10.5)
Casts: NONE SEEN /lpf
Chloride: 103 meq/L (ref 96–112)
Free T4: 1.15 ng/dL (ref 0.80–1.80)
Hemoglobin, Urine: NEGATIVE
Ketones, ur: NEGATIVE mg/dL
Lymphocytes Relative: 41 % (ref 12–46)
Neutro Abs: 3.8 10*3/uL (ref 1.7–7.7)
Neutrophils Relative %: 52 % (ref 43–77)
Nitrite: NEGATIVE
Platelets: 315 10*3/uL (ref 150–400)
Potassium: 3.7 meq/L (ref 3.5–5.3)
RDW: 13.1 % (ref 11.5–15.5)
TSH: 0.676 microintl units/mL (ref 0.350–4.5)
Urobilinogen, UA: 1 (ref 0.0–1.0)

## 2010-03-11 ENCOUNTER — Ambulatory Visit: Payer: Self-pay | Admitting: Internal Medicine

## 2010-03-11 DIAGNOSIS — R599 Enlarged lymph nodes, unspecified: Secondary | ICD-10-CM | POA: Insufficient documentation

## 2010-04-02 ENCOUNTER — Telehealth (INDEPENDENT_AMBULATORY_CARE_PROVIDER_SITE_OTHER): Payer: Self-pay | Admitting: *Deleted

## 2010-04-29 ENCOUNTER — Observation Stay (HOSPITAL_COMMUNITY): Admission: EM | Admit: 2010-04-29 | Discharge: 2010-04-30 | Payer: Self-pay | Admitting: Internal Medicine

## 2010-04-29 ENCOUNTER — Ambulatory Visit: Payer: Self-pay | Admitting: Internal Medicine

## 2010-04-29 ENCOUNTER — Encounter: Payer: Self-pay | Admitting: Internal Medicine

## 2010-04-29 DIAGNOSIS — R079 Chest pain, unspecified: Secondary | ICD-10-CM

## 2010-04-30 ENCOUNTER — Encounter: Payer: Self-pay | Admitting: Internal Medicine

## 2010-05-01 DIAGNOSIS — K219 Gastro-esophageal reflux disease without esophagitis: Secondary | ICD-10-CM

## 2010-05-01 HISTORY — DX: Gastro-esophageal reflux disease without esophagitis: K21.9

## 2010-05-03 ENCOUNTER — Encounter: Payer: Self-pay | Admitting: Internal Medicine

## 2010-05-10 ENCOUNTER — Telehealth: Payer: Self-pay | Admitting: Internal Medicine

## 2010-05-19 ENCOUNTER — Ambulatory Visit: Payer: Self-pay | Admitting: Internal Medicine

## 2010-05-21 ENCOUNTER — Ambulatory Visit (HOSPITAL_COMMUNITY): Admission: RE | Admit: 2010-05-21 | Discharge: 2010-05-21 | Payer: Self-pay | Admitting: Internal Medicine

## 2010-05-26 ENCOUNTER — Telehealth (INDEPENDENT_AMBULATORY_CARE_PROVIDER_SITE_OTHER): Payer: Self-pay

## 2010-05-27 ENCOUNTER — Encounter: Payer: Self-pay | Admitting: Cardiology

## 2010-05-27 ENCOUNTER — Ambulatory Visit: Payer: Self-pay | Admitting: Cardiology

## 2010-05-27 ENCOUNTER — Encounter (HOSPITAL_COMMUNITY)
Admission: RE | Admit: 2010-05-27 | Discharge: 2010-07-31 | Payer: Self-pay | Source: Home / Self Care | Attending: Internal Medicine | Admitting: Internal Medicine

## 2010-05-27 ENCOUNTER — Ambulatory Visit: Payer: Self-pay

## 2010-05-31 ENCOUNTER — Telehealth: Payer: Self-pay | Admitting: Internal Medicine

## 2010-06-21 ENCOUNTER — Telehealth: Payer: Self-pay | Admitting: Internal Medicine

## 2010-07-28 ENCOUNTER — Telehealth: Payer: Self-pay | Admitting: Internal Medicine

## 2010-08-05 ENCOUNTER — Telehealth: Payer: Self-pay | Admitting: Internal Medicine

## 2010-08-05 ENCOUNTER — Encounter: Payer: Self-pay | Admitting: Internal Medicine

## 2010-08-26 ENCOUNTER — Ambulatory Visit: Admit: 2010-08-26 | Payer: Self-pay | Admitting: Internal Medicine

## 2010-08-27 ENCOUNTER — Other Ambulatory Visit: Payer: Self-pay | Admitting: Internal Medicine

## 2010-08-27 ENCOUNTER — Ambulatory Visit
Admission: RE | Admit: 2010-08-27 | Discharge: 2010-08-27 | Payer: Self-pay | Source: Home / Self Care | Attending: Internal Medicine | Admitting: Internal Medicine

## 2010-08-27 DIAGNOSIS — R82998 Other abnormal findings in urine: Secondary | ICD-10-CM | POA: Insufficient documentation

## 2010-08-27 DIAGNOSIS — E8809 Other disorders of plasma-protein metabolism, not elsewhere classified: Secondary | ICD-10-CM | POA: Insufficient documentation

## 2010-08-27 DIAGNOSIS — E0590 Thyrotoxicosis, unspecified without thyrotoxic crisis or storm: Secondary | ICD-10-CM

## 2010-08-27 LAB — CONVERTED CEMR LAB
Albumin ELP: 57 % (ref 55.8–66.1)
Albumin: 5.1 g/dL (ref 3.5–5.2)
Alpha-1-Globulin: 5.2 % — ABNORMAL HIGH (ref 2.9–4.9)
Alpha-2-Globulin: 12.9 % — ABNORMAL HIGH (ref 7.1–11.8)
Blood, UA: NEGATIVE
CO2: 25 meq/L (ref 19–32)
Calcium: 10.1 mg/dL (ref 8.4–10.5)
Chloride: 101 meq/L (ref 96–112)
Cholesterol: 177 mg/dL (ref 0–200)
Eosinophils Relative: 1 % (ref 0–5)
Free T4: 1.01 ng/dL (ref 0.80–1.80)
Glucose, Bld: 95 mg/dL (ref 70–99)
HCT: 45.8 % (ref 36.0–46.0)
Hemoglobin: 15.2 g/dL — ABNORMAL HIGH (ref 12.0–15.0)
Lymphocytes Relative: 40 % (ref 12–46)
Lymphs Abs: 3.6 10*3/uL (ref 0.7–4.0)
Neutro Abs: 4.4 10*3/uL (ref 1.7–7.7)
Platelets: 315 10*3/uL (ref 150–400)
Sodium: 139 meq/L (ref 135–145)
Specific Gravity, Urine: 1.029 (ref 1.005–1.03)
Total Bilirubin: 0.5 mg/dL (ref 0.3–1.2)
Total Protein, Serum Electrophoresis: 8.6 g/dL — ABNORMAL HIGH (ref 6.0–8.3)
Total Protein: 8.6 g/dL — ABNORMAL HIGH (ref 6.0–8.3)
Triglycerides: 58 mg/dL (ref <150)
Urine Glucose: NEGATIVE mg/dL
VLDL: 12 mg/dL (ref 0–40)
Vitamin B-12: >2000 pg/mL — ABNORMAL HIGH (ref 211–911)
WBC: 8.9 10*3/uL (ref 4.0–10.5)
pH: 6 (ref 5.0–8.0)

## 2010-08-31 NOTE — Progress Notes (Signed)
Summary: med refill/gp  Phone Note Refill Request Message from:  Fax from Pharmacy on May 10, 2010 10:56 AM  Refills Requested: Medication #1:  HYDROCODONE-ACETAMINOPHEN 10-325 MG TABS take one tablet every 4 to 6 hours by mouth as needed pain   Last Refilled: 04/30/2010 Last apt. 04/29/10.   Method Requested: Telephone to Pharmacy Initial call taken by: Chinita Pester RN,  May 10, 2010 10:56 AM  Follow-up for Phone Call        will address at her visit Follow-up by: Julaine Fusi  DO,  May 19, 2010 2:06 PM

## 2010-08-31 NOTE — Discharge Summary (Signed)
Summary: Hospital Discharge Update    Hospital Discharge Update:  Date of Admission: 04/29/2010 Date of Discharge: 04/30/2010  Brief Summary:  Pt admitted from clinic with sob, chest pain, diaphoresis and chills.  Afebrile during admission and CXR negative for infiltrates, however d/ced on ZPack given above signs. ACS ruled out with negative cardiac markers and EKG w/o ischemic changes. Sob was concerning for COPD exerc, was on IV solumedrol for 24 days and discharged on Prednisone taper.   Lab or other results pending at discharge:  Barium swallow, 2D Echo, blood cultures  Medication list changes:  Added new medication of HYDROCODONE-ACETAMINOPHEN 10-325 MG TABS (HYDROCODONE-ACETAMINOPHEN) take one tablet every 4 to 6 hours by mouth as needed pain - Signed Added new medication of LORAZEPAM 1 MG TABS (LORAZEPAM) take one tablet by mouth at bedtime only - Signed Rx of HYDROCODONE-ACETAMINOPHEN 10-325 MG TABS (HYDROCODONE-ACETAMINOPHEN) take one tablet every 4 to 6 hours by mouth as needed pain;  #60 x 0;  Signed;  Entered by: Jaci Lazier MD;  Authorized by: Jaci Lazier MD;  Method used: Handwritten Rx of LORAZEPAM 1 MG TABS (LORAZEPAM) take one tablet by mouth at bedtime only;  #10 x 0;  Signed;  Entered by: Jaci Lazier MD;  Authorized by: Jaci Lazier MD;  Method used: Handwritten  The medication, problem, and allergy lists have been updated.  Please see the dictated discharge summary for details.  Discharge medications:  ALPRAZOLAM 1 MG  TABS (ALPRAZOLAM) Take 1 tablet by mouth three times a day as needed for anxiety NEXIUM 40 MG  CPDR (ESOMEPRAZOLE MAGNESIUM) Take 1 tablet by mouth once a day NORCO 10-325 MG TABS (HYDROCODONE-ACETAMINOPHEN) Take 1 tablet by mouth every 6 hours POLYETHYLENE GLYCOL 3350  POWD (POLYETHYLENE GLYCOL 3350) use 17 grams in 10 oz of water once or twice daily for constipation  titrate as needed DURAGESIC-75 75 MCG/HR PT72 (FENTANYL) Apply one patch as  directed every 48 hours (new instructions) MEGACE ES 625 MG/5ML SUSP (MEGESTROL ACETATE) 625mg (5ml) by mouth daily as directed SYMBICORT 160-4.5 MCG/ACT AERO (BUDESONIDE-FORMOTEROL FUMARATE) One puff twice daily as directed SPIRIVA HANDIHALER 18 MCG  CAPS (TIOTROPIUM BROMIDE MONOHYDRATE) contents of one capsule inhaled daily PROAIR HFA 108 (90 BASE) MCG/ACT AERS (ALBUTEROL SULFATE) Take 1-2 puffs every 4 hours as needed for shortness of breath ZYRTEC ALLERGY 10 MG TABS (CETIRIZINE HCL) Take 1 tablet by mouth once a day PROMETHAZINE HCL 25 MG TABS (PROMETHAZINE HCL) Take 1 tablet by mouth four times a day as needed NICODERM CQ 21 MG/24HR PT24 (NICOTINE) One patch a day. Do not smoke while on this patch. PREDNISONE (PAK) 10 MG TABS (PREDNISONE) 6 day taper as directed AZITHROMYCIN 500 MG TABS (AZITHROMYCIN) Take 1 tablet by mouth once a day for 7 days PREDNISONE 10 MG TABS (PREDNISONE) Take three tablets for 3 days, 2 tablets for 2 days and 1 tablet for 2 days then stop HYDROCODONE-ACETAMINOPHEN 10-325 MG TABS (HYDROCODONE-ACETAMINOPHEN) take one tablet every 4 to 6 hours by mouth as needed pain LORAZEPAM 1 MG TABS (LORAZEPAM) take one tablet by mouth at bedtime only  Other patient instructions:  Pls follow-up with Dr. Phillips Odor, the clinic will contact you for an appointment date and time. Call the clinic if you have any other questions or concerns.  Note: Hospital Discharge Medications & Other Instructions handout was printed, one copy for patient and a second copy to be placed in hospital chart.

## 2010-08-31 NOTE — Miscellaneous (Signed)
  Clinical Lists Changes  Medications: Added new medication of CYMBALTA 30 MG CPEP (DULOXETINE HCL) Take one tablet once daily for one week than increase as directed. - Signed Rx of CYMBALTA 30 MG CPEP (DULOXETINE HCL) Take one tablet once daily for one week than increase as directed.;  #60 x 1;  Signed;  Entered by: Julaine Fusi  DO;  Authorized by: Julaine Fusi  DO;  Method used: [facility] [address] [phone]*, [address] [place], [place], [place]  [address], Ph: [phone] or [phone], Fax: [phone]    Prescriptions: CYMBALTA 30 MG CPEP (DULOXETINE HCL) Take one tablet once daily for one week than increase as directed.  #60 x 1   Entered and Authorized by:   Julaine Fusi  DO   Signed by:   Julaine Fusi  DO on 10/01/2009   Method used:   [facility]        [facility] [address] [phone]* (retail)       [address]       [place], [place]  [address]       Ph: [phone] or [phone]       Fax: [phone]   RxID:   [id number]

## 2010-08-31 NOTE — Assessment & Plan Note (Signed)
Summary: CHECKUP/SB.   Vital Signs:  Patient profile:   50 year old female Height:      67 inches (170.18 cm) Weight:      125.0 pounds (56.82 kg) BMI:     19.65 O2 Sat:      93 % on REST Temp:     96.8 degrees F (36 degrees C) oral Pulse rate:   75 / minute BP sitting:   112 / 79  (right arm) Cuff size:   regular  Vitals Entered By: Theotis Barrio NT II (March 11, 2010 10:21 AM)  O2 Flow:  REST CC: SOB / COUGH Is Patient Diabetic? No Pain Assessment Patient in pain? no      Nutritional Status BMI of 19 -24 = normal  Have you ever been in a relationship where you felt threatened, hurt or afraid?No   Does patient need assistance? Functional Status Self care Ambulation Normal    Primary Care Provider:  Julaine Fusi  DO  CC:  SOB / COUGH.  History of Present Illness: Neck pain- chronic at baseline, fairly well controlled Dysphagia- chronic  Dyspnea-worsening with activity. uses inhalers intermittently Depression-worse bcause her dog just died. tearful today. Fatigue- chronic but worsening- associated with her depression but she refuses medications for depression.  Preventive Screening-Counseling & Management  Alcohol-Tobacco     Smoking Status: current     Smoking Cessation Counseling: yes     Packs/Day: 1 ppd     Year Started: AT THE AGE OF [age]  Caffeine-Diet-Exercise     Does Patient Exercise: no  Allergies: 1)  ! * Ambien 2)  ! Lyrica 3)  ! Prednisone  Physical Exam  General:  alert and well-developed.   Head:  scalp psoriasis, no other lesions Mouth:  throat erythema, pnd Neck:  bilateral cervical adenopathy- large node tender, movable Lungs:  normal respiratory effort and scattered wheezes.  Heart:  normal rate, regular rhythm, and no murmur.   Abdomen:  no enlarged spleen Msk:  multiple paravertebral tenderpoints, pos spurling, pain radicualr with extention of neck. Extremities:  mild clubbing in fingers, skin is dusky at baseline Psych:   depressed appearing, no SI   Impression & Recommendations:  Problem # 1:  COPD (ICD-496)  Worsening Chronic Obstructive Lung Disease. Has only been on Spiriva. Difficulty using the Advair-will switch to Symbicort. Also gave her an alburterol rescue inhaler. Minimal activity makes her SOB. Still smoking. Discussed quitting cigarettes and in teh mean time significant reduction. her resting o2 sats are 93%- dropped to 90% with activity. She is likely a good candidate for home O2. I will have HH go out and check her night time o2 sats to see if she qualifies for oxygen. Would like to get a baseline ABG.  Her updated medication list for this problem includes:    Symbicort 160-4.5 Mcg/act Aero (Budesonide-formoterol fumarate) ..... One puff twice daily as directed    Spiriva Handihaler 18 Mcg Caps (Tiotropium bromide monohydrate) .Marland Kitchen... Contents of one capsule inhaled daily    Proair Hfa 108 (90 Base) Mcg/act Aers (Albuterol sulfate) .Marland Kitchen... Take 1-2 puffs every 4 hours as needed for shortness of breath  Her updated medication list for this problem includes:    Advair Diskus 100-50 Mcg/dose Misc (Fluticasone-salmeterol) .Marland Kitchen... 1 puff 2 times daily    Spiriva Handihaler 18 Mcg Caps (Tiotropium bromide monohydrate) .Marland Kitchen... Contents of one capsule inhaled daily    Proair Hfa 108 (90 Base) Mcg/act Aers (Albuterol sulfate) .Marland Kitchen... Take 1-2 puffs every  4 hours as needed for shortness of breath  Orders: Home Health Referral (Home Health)  Problem # 2:  UPPER RESPIRATORY INFECTION (ICD-465.9) No active infection-baseline chronic bronchitis. Her updated medication list for this problem includes:    Zyrtec Allergy 10 Mg Tabs (Cetirizine hcl) .Marland Kitchen... Take 1 tablet by mouth once a day    Promethazine Hcl 25 Mg Tabs (Promethazine hcl) .Marland Kitchen... Take 1 tablet by mouth four times a day as needed  Her updated medication list for this problem includes:    Zyrtec Allergy 10 Mg Tabs (Cetirizine hcl) .Marland Kitchen... Take 1 tablet by  mouth once a day    Promethazine Hcl 25 Mg Tabs (Promethazine hcl) .Marland Kitchen... Take 1 tablet by mouth four times a day as needed  Problem # 3:  LYMPHADENOPATHY (ICD-785.6) I am concerned about her persistent cervical, axillary? adenopathy. Multiple lymph nodes- >1CM palpable and tender. May be coming from her bad scalp psoriasis. With her weight loss and fatigue I have done an extensive w/u for underlying malignancy in the recent past. I am inclined to think these nodes are reactive and I will follow them closely.  My next step will be a CT scan of her neck to characterize the lymph nodes- she does have risk factors for laryngeal cancer.  CT Chest in 08 - negative for cancer CT Abd/Pelvis 09- very small liver lesions non-specific, otehrwise normal Colonoscopy 09- normal  May need upper endo-will get records-patient reports that this has been done already.  Problem # 4:  CHRONIC PAIN SYNDROME (ICD-338.4) Severe cervical spinal stenosis-C6. Patient refusiong surgical evaluation at this point. Requiring high doses of opiates to relieve her pain. No change in meds today.  Problem # 5:  DEPRESSION (ICD-311) Continues to refuse medication treatment. Her updated medication list for this problem includes:    Alprazolam 1 Mg Tabs (Alprazolam) .Marland Kitchen... Take 1 tablet by mouth three times a day as needed for anxiety  Complete Medication List: 1)  Alprazolam 1 Mg Tabs (Alprazolam) .... Take 1 tablet by mouth three times a day as needed for anxiety 2)  Nexium 40 Mg Cpdr (Esomeprazole magnesium) .... Take 1 tablet by mouth once a day 3)  Norco 10-325 Mg Tabs (Hydrocodone-acetaminophen) .... Take 1 tablet by mouth every 6 hours 4)  Polyethylene Glycol 3350 Powd (Polyethylene glycol 3350) .... Use 17 grams in 10 oz of water once or twice daily for constipation  titrate as needed 5)  Duragesic-75 75 Mcg/hr Pt72 (Fentanyl) .... Apply one patch as directed every 48 hours (new instructions) 6)  Megace Es 625 Mg/50ml Susp  (Megestrol acetate) .... 625mg (5ml) by mouth daily as directed 7)  Symbicort 160-4.5 Mcg/act Aero (Budesonide-formoterol fumarate) .... One puff twice daily as directed 8)  Spiriva Handihaler 18 Mcg Caps (Tiotropium bromide monohydrate) .... Contents of one capsule inhaled daily 9)  Proair Hfa 108 (90 Base) Mcg/act Aers (Albuterol sulfate) .... Take 1-2 puffs every 4 hours as needed for shortness of breath 10)  Zyrtec Allergy 10 Mg Tabs (Cetirizine hcl) .... Take 1 tablet by mouth once a day 11)  Promethazine Hcl 25 Mg Tabs (Promethazine hcl) .... Take 1 tablet by mouth four times a day as needed 12)  Nicoderm Cq 21 Mg/24hr Pt24 (Nicotine) .... One patch a day. do not smoke while on this patch. 13)  Prednisone (pak) 10 Mg Tabs (Prednisone) .... 6 day taper as directed  Other Orders: 12 Lead EKG (12 Lead EKG)  Patient Instructions: 1)  Please schedule a follow-up appointment  in apx 1 month with Phillips Odor. Prescriptions: SYMBICORT 160-4.5 MCG/ACT AERO (BUDESONIDE-FORMOTEROL FUMARATE) One puff twice daily as directed  #1 x 6   Entered and Authorized by:   Julaine Fusi  DO   Signed by:   Julaine Fusi  DO on 03/11/2010   Method used:   [facility]        [facility] [address] [phone]* (retail)       [address]       [place], [place]  [address]       Ph: [phone] or [phone]       Fax: [phone]   RxID:   [id number]    Prevention & Chronic Care Immunizations   Influenza vaccine: Fluvax MCR  (04/30/2008)   Influenza vaccine deferral: Refused  (09/10/2009)    Tetanus booster: Not documented   Td booster deferral: Deferred  (09/10/2009)    Pneumococcal vaccine: Not documented  Colorectal Screening   Hemoccult: Not documented    Colonoscopy: Not documented  Other Screening   Pap smear: Not documented    Mammogram: ASSESSMENT: Negative - BI-RADS 1^MM DIGITAL SCREENING  (01/07/2009)   Mammogram due: 11/2008   Smoking status: current   (03/11/2010)   Smoking cessation counseling: yes  (03/11/2010)  Lipids   Total Cholesterol: 186  (10/27/2008)   LDL: 117  (10/27/2008)   LDL Direct: Not documented   HDL: 43  (10/27/2008)   Triglycerides: 132  (10/27/2008)

## 2010-08-31 NOTE — Letter (Signed)
Summary: [facility]  [facility]   Imported By: Florinda Marker 09/23/2009 14:17:03  _____________________________________________________________________  External Attachment:    Type:   Image     Comment:   External Document

## 2010-08-31 NOTE — Letter (Signed)
Summary: [facility] [employer] [employer]  [facility] [employer]   Imported By: Florinda Marker 09/03/2009 09:55:12  _____________________________________________________________________  External Attachment:    Type:   Image     Comment:   External Document

## 2010-08-31 NOTE — Progress Notes (Signed)
Summary: pt cancelled home health services/ hla  Phone Note Other Incoming   Summary of Call: [facility] called to say pt had cancelled all services w/ them. Initial call taken by: Marin Roberts RN,  October 19, 2009 11:09 AM

## 2010-08-31 NOTE — Progress Notes (Signed)
Summary: phone/gg  Phone Note Call from Patient  on June 21, 2010 12:42 PM  Refills Requested: Medication #1:  OXYCODONE-ACETAMINOPHEN 5-325 MG TABS Take 1 tablet by mouth every 6 hours as needed for breakthrough pain. **Pt is requesting hydrocodone instead of oxy.  Will you change back.   Method Requested: Telephone to Pharmacy Initial call taken by: Merrie Roof RN,  June 21, 2010 12:42 PM  Follow-up for Phone Call        ok to call in Hydrocodone. Follow-up by: Julaine Fusi  DO,  June 21, 2010 3:49 PM  Additional Follow-up for Phone Call Additional follow up Details #1::        Rx Called In Additional Follow-up by: Angelina Ok RN,  June 21, 2010 4:16 PM    New/Updated Medications: HYDROCODONE-ACETAMINOPHEN 10-325 MG TABS (HYDROCODONE-ACETAMINOPHEN) Take 1 tablet by mouth every 8 hours as needed for pain Prescriptions: HYDROCODONE-ACETAMINOPHEN 10-325 MG TABS (HYDROCODONE-ACETAMINOPHEN) Take 1 tablet by mouth every 8 hours as needed for pain  #90 x 2   Entered and Authorized by:   Julaine Fusi  DO   Signed by:   Julaine Fusi  DO on 06/21/2010   Method used:   Telephoned to ...       [facility] [address] [phone]* (retail)       [address]       [place], [place]  [address]       Ph: [phone] or [phone]       Fax: [phone]   RxID:   [id number]

## 2010-08-31 NOTE — Progress Notes (Signed)
Summary: HHN report, appt/ hla  Phone Note Other Incoming   Summary of Call: [facility] nurse calls saying she is at pt's home now for home health eval and adm, pt's bp is 86/60 down to 76/40 standing, hr 110-128, needs ensure and nicotine patches. pt does report being anxious. paged dr Phillips Odor, she asks that pt be seen today, dr Devion Chriscoe will see, called hhn she has left pt's home but i did relay plan to her, called pt, she seems to be very lethargic in speech and appears to be very weak, she is ask to come in at 1530, states she does not have a ride, i then told her we would need to get ems to transport her to er and she agreed to call someone, i will check back with her in appr 1 hr and confirm transportation Initial call taken by: Marin Roberts RN,  October 08, 2009 12:11 PM  Follow-up for Phone Call        I suspect that she needs to be admitted. May need to go to ED. Follow-up by: Julaine Fusi  DO,  October 08, 2009 12:23 PM
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
## 2010-08-31 NOTE — Assessment & Plan Note (Signed)
Summary: HFU/gg   Vital Signs:  Patient profile:   50 year old female Height:      67 inches (170.18 cm) Weight:      122.04 pounds (55.47 kg) BMI:     19.18 Temp:     97.2 degrees F (36.22 degrees C) oral Pulse rate:   89 / minute BP sitting:   114 / 78  (left arm)  Vitals Entered By: Angelina Ok RN (May 19, 2010 1:33 PM) CC: Depression Is Patient Diabetic? No Pain Assessment Patient in pain? yes     Location: shoulder, upper back, neck Intensity: 6 Type: aching Onset of pain  Constant Nutritional Status BMI of 19 -24 = normal  Have you ever been in a relationship where you felt threatened, hurt or afraid?No   Does patient need assistance? Functional Status Self care Ambulation Normal Comments HFU.  Feeling better.  Wants some changes in a med.   Primary Care Provider:  Julaine Fusi  DO  CC:  Depression.  History of Present Illness: [name] is doing much better today. Dyspnea has improved. Still has intermittent chest pain and tightness. Often associated with food intake. Mild dysphagia. SOB with exhertion but was able to take her grandaughter fishing last weekend. Nausea Vomitting resolved.  Depression History:      The patient denies a depressed mood most of the day and a diminished interest in her usual daily activities.        The patient denies that she feels like life is not worth living, denies that she wishes that she were dead, and denies that she has thought about ending her life.         Preventive Screening-Counseling & Management  Alcohol-Tobacco     Smoking Status: current     Smoking Cessation Counseling: yes     Packs/Day: 1 ppd     Year Started: AT THE AGE OF [age]  Current Medications (verified): 1)  Alprazolam 1 Mg  Tabs (Alprazolam) .... Take 1 Tablet By Mouth Three Times A Day As Needed For Anxiety 2)  Nexium 40 Mg  Cpdr (Esomeprazole Magnesium) .... Take 1 Tablet By Mouth Once A Day 3)  Polyethylene Glycol 3350  Powd (Polyethylene  Glycol 3350) .... Use 17 Grams in 10 Oz of Water Once or Twice Daily For Constipation  Titrate As Needed 4)  Duragesic-75 75 Mcg/hr Pt72 (Fentanyl) .... Apply One Patch As Directed Every 48 Hours (New Instructions) 5)  Symbicort 160-4.5 Mcg/act Aero (Budesonide-Formoterol Fumarate) .... One Puff Twice Daily As Directed 6)  Spiriva Handihaler 18 Mcg  Caps (Tiotropium Bromide Monohydrate) .... Contents of One Capsule Inhaled Daily 7)  Proair Hfa 108 (90 Base) Mcg/act Aers (Albuterol Sulfate) .... Take 1-2 Puffs Every 4 Hours As Needed For Shortness of Breath 8)  Lorazepam 1 Mg Tabs (Lorazepam) .... Take 1 Tablet By Mouth Once A Day At Bedtime 9)  Cyanocobalamin 1000 Mcg/ml Soln (Cyanocobalamin) .... Qmonthly 10)  Docusate Sodium 100 Mg Tabs (Docusate Sodium) .... Take 1 Tablet By Mouth Two Times A Day 11)  Oxycodone-Acetaminophen 5-325 Mg Tabs (Oxycodone-Acetaminophen) .... Take 1 Tablet By Mouth Every 6 Hours As Needed For Breakthrough Pain  Allergies (verified): 1)  ! * Ambien 2)  ! Lyrica 3)  ! Prednisone  Review of Systems      See HPI   Impression & Recommendations:  Problem # 1:  CHEST PAIN (ICD-786.50) Less intense substernal persistent pain. Associated with severe fatigue and dyspnea. Reflux controlled with PPI. On  her hospital admission she had nausea and vomiting associated with intense CP-Atypical. She does have occasional odynophagia- when swallowing some foods she says she feels a "burning pain like hot coffee".   Will start with a barium swallow evaluation for espophageal dysfunction. She also needs stress testing- multiple risk factors for CAD-tobacco, family history. She ruled out in hospital for ACS. EKGs are normal. Very atypical pain but needs stress test to be complete.  Orders: Cardiology Referral (Cardiology)  Problem # 2:  UPPER RESPIRATORY INFECTION (ICD-465.9) Resolved. Completed Prednisone and Azithromycin. The following medications were removed from the  medication list:    Zyrtec Allergy 10 Mg Tabs (Cetirizine hcl) .Marland Kitchen... Take 1 tablet by mouth once a day    Promethazine Hcl 25 Mg Tabs (Promethazine hcl) .Marland Kitchen... Take 1 tablet by mouth four times a day as needed  Problem # 3:  B12 DEFICIENCY (ICD-266.2) IM injections q monthly.  Problem # 4:  CHRONIC PAIN SYNDROME (ICD-338.4) Overall her functional staus is much better on opiate pain medication. She was able to go fishing last weekend and is more active than usual.She may however be having some GI motility side effects. We will continue her Fentanyl Patch and use oxycodone for breakthrough pain. Sleep is a major issue for her as well. She prefers the Lorazapam only at night for sleep and the alprazolam during the day for anxiety-mood problems.   Problem # 5:  WEIGHT LOSS-ABNORMAL (ICD-783.21)  Complete Medication List: 1)  Alprazolam 1 Mg Tabs (Alprazolam) .... Take 1 tablet by mouth three times a day as needed for anxiety 2)  Nexium 40 Mg Cpdr (Esomeprazole magnesium) .... Take 1 tablet by mouth once a day 3)  Polyethylene Glycol 3350 Powd (Polyethylene glycol 3350) .... Use 17 grams in 10 oz of water once or twice daily for constipation  titrate as needed 4)  Duragesic-75 75 Mcg/hr Pt72 (Fentanyl) .... Apply one patch as directed every 48 hours (new instructions) 5)  Symbicort 160-4.5 Mcg/act Aero (Budesonide-formoterol fumarate) .... One puff twice daily as directed 6)  Spiriva Handihaler 18 Mcg Caps (Tiotropium bromide monohydrate) .... Contents of one capsule inhaled daily 7)  Proair Hfa 108 (90 Base) Mcg/act Aers (Albuterol sulfate) .... Take 1-2 puffs every 4 hours as needed for shortness of breath 8)  Lorazepam 1 Mg Tabs (Lorazepam) .... Take 1 tablet by mouth once a day at bedtime 9)  Cyanocobalamin 1000 Mcg/ml Soln (Cyanocobalamin) .... Qmonthly 10)  Docusate Sodium 100 Mg Tabs (Docusate sodium) .... Take 1 tablet by mouth two times a day 11)  Oxycodone-acetaminophen 5-325 Mg Tabs  (Oxycodone-acetaminophen) .... Take 1 tablet by mouth every 6 hours as needed for breakthrough pain  Other Orders: Radiology other (Radiology Other)  Patient Instructions: 1)  Please schedule a follow-up appointment in 3 months. Prescriptions: OXYCODONE-ACETAMINOPHEN 5-325 MG TABS (OXYCODONE-ACETAMINOPHEN) Take 1 tablet by mouth every 6 hours as needed for breakthrough pain  #90 x 0   Entered and Authorized by:   Julaine Fusi  DO   Signed by:   Julaine Fusi  DO on 05/19/2010   Method used:   Print then Give to Patient   RxID:   [id number] LORAZEPAM 1 MG TABS (LORAZEPAM) Take 1 tablet by mouth once a day at bedtime  #30 x 0   Entered and Authorized by:   Julaine Fusi  DO   Signed by:   Julaine Fusi  DO on 05/19/2010   Method used:   Print then Give to Patient  RxID:   [id number] DOCUSATE SODIUM 100 MG TABS (DOCUSATE SODIUM) Take 1 tablet by mouth two times a day  #60 x 11   Entered and Authorized by:   Julaine Fusi  DO   Signed by:   Julaine Fusi  DO on 05/19/2010   Method used:   [facility] to        [facility] [address] [phone]* (retail)       [address]       [place], [place]  [address]       Ph: [phone] or [phone]       Fax: [phone]   RxID:   [id number] PROAIR HFA 108 (90 BASE) MCG/ACT AERS (ALBUTEROL SULFATE) Take 1-2 puffs every 4 hours as needed for shortness of breath  #1 x 11   Entered and Authorized by:   Julaine Fusi  DO   Signed by:   Julaine Fusi  DO on 05/19/2010   Method used:   Electronically to        [facility] [address] [phone]* (retail)       [address]       [place], [place]  [address]       Ph: [phone] or [phone]       Fax: [phone]   RxID:   [id number]    Orders Added: 1)  Radiology other [Radiology Other] 2)  Cardiology Referral [Cardiology] 3)  Est. Patient Level IV [61607]    Prevention & Chronic Care Immunizations   Influenza vaccine:  Fluvax MCR  (04/30/2008)   Influenza vaccine deferral: Refused  (09/10/2009)    Tetanus booster: Not documented   Td booster deferral: Deferred  (09/10/2009)    Pneumococcal vaccine: Not documented  Colorectal Screening   Hemoccult: Not documented    Colonoscopy: Not documented  Other Screening   Pap smear: Not documented    Mammogram: ASSESSMENT: Negative - BI-RADS 1^MM DIGITAL SCREENING  (01/07/2009)   Mammogram due: 11/2008   Smoking status: current  (05/19/2010)   Smoking cessation counseling: yes  (05/19/2010)  Lipids   Total Cholesterol: 186  (10/27/2008)   LDL: 117  (10/27/2008)   LDL Direct: Not documented   HDL: 43  (10/27/2008)   Triglycerides: 132  (10/27/2008)    Vital Signs:  Patient profile:   50 year old female Height:      67 inches (170.18 cm) Weight:      122.04 pounds (55.47 kg) BMI:     19.18 Temp:     97.2 degrees F (36.22 degrees C) oral Pulse rate:   89 / minute BP sitting:   114 / 78  (left arm)  Vitals Entered By: Angelina Ok RN (May 19, 2010 1:33 PM)

## 2010-08-31 NOTE — Miscellaneous (Signed)
[facility]: [facility]   Imported By: Margie Billet 11/09/2009 11:24:04  _____________________________________________________________________  External Attachment:    Type:   Image     Comment:   External Document

## 2010-08-31 NOTE — Initial Assessments (Signed)
INTERNAL MEDICINE ADMISSION HISTORY AND PHYSICAL  Attending: Dr. Doneen Poisson  First contact: Dr. [name] [phone] Second contact: Dr. Eben Burow [phone] [facility], after-hours: [phone]  PCP: Dr. Phillips Odor  CC: SOB and Chest pain  HPI: Pt is a 50 yo female with PMH of COPD, tobacco abuse, depression/anxiety and chronic back pain who went to [facility] for worsening SOB and chest pain. This started yesterday and she had SOB, chill, cough with very mild clear sputum and chest pain with nausea and diaphoresis. She denies fever, chills or sick contacts. Her chesp pain was located in her left side, constant, 7/10 is the worst severity, deep breath made it better, no radiation. She also had chronic epigastric pain, denies any hematemesis, melena, or hematochezia. She denies leg pain, diarrhea, dysuria. Current smoker, denies current drug abuse. Of note she does not use her COPD meds as instructed.    ALLERGIES: ! * AMBIEN: Rash ! LYRICA: Rash ! PREDNISONE: mental status change  PAST MEDICAL HISTORY: Anxiety Depression Headache Cervical Cancer Hx- treated by Dr. Debby Bud Chronic Back Pain C-Spine radiculopathy, DJD COPD Arthritis Myofascial Pain Syndrome History of cervical cancer s/p cone biopsy   MEDICATIONS: ALPRAZOLAM 1 MG  TABS (ALPRAZOLAM) Take 1 tablet by mouth three times a day as needed for anxiety NEXIUM 40 MG  CPDR (ESOMEPRAZOLE MAGNESIUM) Take 1 tablet by mouth once a day NORCO 10-325 MG TABS (HYDROCODONE-ACETAMINOPHEN) Take 1 tablet by mouth every 6 hours POLYETHYLENE GLYCOL 3350  POWD (POLYETHYLENE GLYCOL 3350) use 17 grams in 10 oz of water once or twice daily for constipation  titrate as needed DURAGESIC-75 75 MCG/HR PT72 (FENTANYL) Apply one patch as directed every 48 hours (new instructions) MEGACE ES 625 MG/5ML SUSP (MEGESTROL ACETATE) 625mg (5ml) by mouth daily as directed SYMBICORT 160-4.5 MCG/ACT AERO (BUDESONIDE-FORMOTEROL FUMARATE) One puff twice daily as  directed SPIRIVA HANDIHALER 18 MCG  CAPS (TIOTROPIUM BROMIDE MONOHYDRATE) contents of one capsule inhaled daily PROAIR HFA 108 (90 BASE) MCG/ACT AERS (ALBUTEROL SULFATE) Take 1-2 puffs every 4 hours as needed for shortness of breath ZYRTEC ALLERGY 10 MG TABS (CETIRIZINE HCL) Take 1 tablet by mouth once a day PROMETHAZINE HCL 25 MG TABS (PROMETHAZINE HCL) Take 1 tablet by mouth four times a day as needed NICODERM CQ 21 MG/24HR PT24 (NICOTINE) One patch a day. Do not smoke while on this patch.    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 Dry mucosa with erythematous pharynx, no exudates. NECK: supple, no JVD, no cervical LN LUNGS: clear to auscultation bilaterally, mild wheezing, no crackles.  CVS: RR, tachycardia, no murmur/rubs/gallops. Left chest tender to deep palpation. ABD: Soft, mild epigastric tenderness, no distension/rebounding/guarding, normal bowel sounds. EXTREMITIES: No edema or cyanosis NEURO: Alert & Oriented x3, no focal motor or sensory deficits.     LABS:  WBC  3.9        l      4.0-10.5         K/uL  RBC                                      4.52              3.87-5.11        MIL/uL  Hemoglobin (HGB)                         14.7              12.0-15.0        g/dL  Hematocrit (HCT)                         42.5              36.0-46.0        %  MCV                                      94.0              78.0-100.0       fL  MCH -                                     32.5              26.0-34.0        pg  MCHC                                     34.6              30.0-36.0        g/dL  RDW                                      12.7              11.5-15.5        %  Platelet Count (PLT)                     234               150-400          K/uL  Neutrophils, %                           56                43-77            %  Lymphocytes, %                           36                12-46            %  Monocytes, %  9                 3-12             %  Eosinophils, %                           0                 0-5              %  Basophils, %                             0                 0-1              %  Neutrophils, Absolute                    2.2               1.7-7.7          K/uL  Lymphocytes, Absolute                    1.4               0.7-4.0          K/uL  Monocytes, Absolute                      0.3               0.1-1.0          K/uL  Eosinophils, Absolute                    0.0               0.0-0.7          K/uL  Basophils, Absolute                      0.0               0.0-0.1          K/uL   ASSESSMENT AND PLAN:  1. SOB: Likely due to COPD exaerbation as she has some wheezing and current smoker. But flu is also possible, though she is not sure if she got flu vaccine. Looking back at clinic orders, she didnt get one this season. May consider flu vaccine at discharge. CHF or PNA is also possible but less likely. PE is less likely.    -- Will start solu-medrol IV and bronchodilators including albuterol and atrovent   -- CXR AP and Lat,  BCx   -- Start ABx of rocephin and azithromycin for presumed community acquired PNA   -- BNP  2. Chest pain: Her chest pain is likely due to COPD exacerbation. But EKG shows some change with tall p wave, no significant ischemic change. Will admit to Tele to r/o ACS. Will order CXR to r/o PNA, PTX.    -- EKG, cycle cardiac enzymes x 3 Q6H.             --  2D-Echo to rule out wall motion abnormality and see EF.             -- Start aspirin, Nitroglycerine, morphine for pain.              --  UDS             -- Will not check TSH and free T4 as recent normal value in 12/2009.  3. Chronic back pain: Will continue fentanyl patch and other pain meds.  4. Epigastric pain: Likely due to GERD. Will continue PPI of nexium.   5. ANXIETY: Continue home medications. Denies SI/HI.   6. Tobacco abuse: Provided smoking cessation conseling and nicotine patch.  7. VTE PROPH: lovenox

## 2010-08-31 NOTE — Miscellaneous (Signed)
Summary: HFU APPT [facility] 10/28/2009 WITH DR [facility] Discharge  Date of admission:10/03/2009  Date of discharge:10/06/2009  Brief reason for admission/active problems: 1. Bronchitis --treateed with Doxycycline. Currently on Day #3/10. Continue with Advair, Spiriva and Ventolin. 2. COPD - stable. However, given her young age, will check alpha-1-antitrypsyn defficiency. 3. Opiate WD --due to a discontinued Fentanyl TDS. Experienced a respiratory suppression due to adjustmment of her dose regimen of opiates.  Patient spent 24 hrs in ICU. Currently is doing well. Restarted on her home dose of opiates. 4. Weight loss/failure to thrive. Unclear etiollogy. Will check her TSH/T4. Consider repeat gastric emptying study to evaluate/ nausea/emesis-1.   Followup needed:Please, f/u on TSH/ T4 and alpha1-antitrypsin level results; consider gastric emptying study.  The medication and problem lists have been updated.  Please see the dictated discharge summary for details.    Medications: Added new medication of DOXYCYCLINE HYCLATE 100 MG CAPS (DOXYCYCLINE HYCLATE) Take 1 tablet by mouth two times a day for 7 more days with meals - Signed Added new medication of PROMETHAZINE HCL 25 MG TABS (PROMETHAZINE HCL) Take 1 tablet by mouth four times a day as needed - Signed Rx of DOXYCYCLINE HYCLATE 100 MG CAPS (DOXYCYCLINE HYCLATE) Take 1 tablet by mouth two times a day for 7 more days with meals;  #14 x 0;  Signed;  Entered by: Deatra Robinson MD;  Authorized by: Deatra Robinson MD;  Method used: [facility] [address] [phone]*, [address] [place], [place], [place]  [address], Ph: [phone] or [phone], Fax: [phone] Rx of PROMETHAZINE HCL 25 MG TABS (PROMETHAZINE HCL) Take 1 tablet by mouth four times a day as needed;  #30 x 0;  Signed;  Entered by: Deatra Robinson MD;  Authorized by: Deatra Robinson MD;  Method used: [facility]ically to [facility] [address] [phone]*,  [address] [place], [place], [place]  [address], Ph: [phone] or [phone], Fax: [phone] Rx of PROMETHAZINE HCL 25 MG TABS (PROMETHAZINE HCL) Take 1 tablet by mouth four times a day as needed;  #30 x 0;  Signed;  Entered by: Deatra Robinson MD;  Authorized by: Deatra Robinson MD;  Method used: Electronically to [facility] [address] [phone]*, [address], [place], [place], [place]  [address], Ph: [phone] or [phone], Fax: [phone] Observations: Added new observation of INSTRUCTIONS: Please come to an appointment at the outpatient clinic at [facility] for a followup visit within 1-2 weeks. Ms. Lissa Hoard will contact you with your appointment. Call the clinic with any questions. Please take your medication as prescribed below. (10/05/2009 9:48)    Prescriptions: PROMETHAZINE HCL 25 MG TABS (PROMETHAZINE HCL) Take 1 tablet by mouth four times a day as needed  #30 x 0   Entered and Authorized by:   Deatra Robinson MD   Signed by:   Deatra Robinson MD on 10/06/2009   Method used:   Electronically to        [facility] [address] [phone]* (retail)       [address]       [place], [place]  [address]       Ph: [phone] or [phone]       Fax: [phone]   RxID:   [id number] PROMETHAZINE HCL 25 MG TABS (PROMETHAZINE HCL) Take 1 tablet by mouth four times a day as needed  #30 x 0   Entered and Authorized by:  Deatra Robinson MD   Signed by:   Deatra Robinson MD on 10/06/2009   Method used:   Electronically to        [facility] [address] [phone]* (retail)       [address]       [place], [place]  [address]       Ph: [phone] or [phone]       Fax: [phone]   RxID:   [id number] DOXYCYCLINE HYCLATE 100 MG CAPS (DOXYCYCLINE HYCLATE) Take 1 tablet by mouth two times a day for 7 more days with meals  #14 x 0   Entered and Authorized by:   Deatra Robinson MD   Signed by:   Deatra Robinson MD on 10/05/2009   Method used:   Electronically to        [facility] [address] [phone]* (retail)       [address]       [place], [place]  [address]       Ph: [phone] or [phone]       Fax: [phone]   RxID:   [id number]    Patient Instructions: 1)  Please come to an appointment at the outpatient clinic at [facility] for a followup visit within 1-2 weeks. Ms. Lissa Hoard will contact you with your appointment. 2)  Call the clinic with any questions. 3)  Please take your medication as prescribed below.

## 2010-08-31 NOTE — Progress Notes (Signed)
Summary: med change/gp  Phone Note Refill Request Message from:  Fax from Pharmacy on January 14, 2010 1:46 PM  Refills Requested: Medication #1:  METHYLPREDNISOLONE (PAK) 4 MG TABS Dose pak- 6 day taper Methylpred Pak is not available; on backorder, but Prednisone pak is available - if you want to change. Send new Rx. Thanks   Method Requested: Electronic Initial call taken by: Chinita Pester RN,  January 14, 2010 1:46 PM  Follow-up for Phone Call        Yes will change patient needs this ASAP. Follow-up by: Julaine Fusi  DO,  January 15, 2010 12:18 AM    New/Updated Medications: PREDNISONE (PAK) 10 MG TABS (PREDNISONE) 6 day taper as directed Prescriptions: PREDNISONE (PAK) 10 MG TABS (PREDNISONE) 6 day taper as directed  #1 pak x 0   Entered and Authorized by:   Julaine Fusi  DO   Signed by:   Julaine Fusi  DO on 01/15/2010   Method used:   Electronically to        [facility] [address] [phone]* (retail)       [address]       [place], [place]  [address]       Ph: [phone] or [phone]       Fax: [phone]   RxID:   [id number]

## 2010-08-31 NOTE — Assessment & Plan Note (Signed)
Summary: EST-CK/FU/MES/CFB   Vital Signs:  Patient profile:   50 year old female Height:      67 inches (170.18 cm) Weight:      115 pounds (52.27 kg) BMI:     18.08 Temp:     96.9 degrees F (36.06 degrees C) Pulse rate:   62 / minute BP sitting:   111 / 71  (left arm)  Vitals Entered By: Blenda Mounts (September 10, 2009 10:19 AM) CC: fu, SOB when walk an goes [place] step, no sleep, neck and shoulder pain, hair loss on scalp, congestion, medication question, Depression Is Patient Diabetic? No Pain Assessment Patient in pain? yes     Location: shoulder and neck Intensity: 7 Type: aching Onset of pain  Constant Nutritional Status BMI of < 19 = underweight  Have you ever been in a relationship where you felt threatened, hurt or afraid?No   Does patient need assistance? Functional Status Self care Ambulation Normal   Primary Care Provider:  Julaine Fusi  DO  CC:  fu, SOB when walk an goes [place] step, no sleep, neck and shoulder pain, hair loss on scalp, congestion, medication question, and Depression.  History of Present Illness: Shantay is here for her routine pain management. She is doing well. The Fentanyl patches have been extremely effective at pain control for her. She still has periods of breakthrough pain with minimal relief from the vicodin. She also complains of a non-healing, scaling lesion on her scalp that has been a problems for over a year now. She has been told in the past that this is psoriasis, but shampoos have not worked.  Depression History:      The patient denies a depressed mood most of the day and a diminished interest in her usual daily activities.        Risk factors for depression include a family history of depression, a family history of alcoholism, and chronic illness.  The patient denies that she feels like life is not worth living, denies that she wishes that she were dead, and denies that she has thought about ending her life.          Preventive Screening-Counseling & Management  Alcohol-Tobacco     Smoking Status: current     Smoking Cessation Counseling: yes     Packs/Day: 1 ppd     Year Started: AT THE AGE OF [age]  Caffeine-Diet-Exercise     Does Patient Exercise: no  Current Medications (verified): 1)  Alprazolam 1 Mg  Tabs (Alprazolam) .... Take 1 Tablet By Mouth Three Times A Day As Needed For Anxiety 2)  Nexium 40 Mg  Cpdr (Esomeprazole Magnesium) .... Take 1 Tablet By Mouth Once A Day 3)  Combivent 103-18 Mcg/act Aero (Ipratropium-Albuterol) .... 2 Puffs Three Times A Day 4)  Norco 10-325 Mg Tabs (Hydrocodone-Acetaminophen) .... Take One Tab By Mouth Every 6 Hours As Needed For Pain 5)  Polyethylene Glycol 3350  Powd (Polyethylene Glycol 3350) .... Use 17 Grams in 10 Oz of Water Once or Twice Daily For Constipation  Titrate As Needed 6)  Duragesic-75 75 Mcg/hr Pt72 (Fentanyl) .... Apply One Patch As Directed Every 48 Hours (New Instructions) 7)  Tens Home Unit  (Nerve Stimulator) .... Use As Directed To Affected Areas Two Times A Day As Needed For Muscle Spasm 8)  Megace Es 625 Mg/15ml Susp (Megestrol Acetate) .... 625mg (5ml) By Mouth Daily As Directed  Allergies: 1)  ! * Ambien 2)  ! Lyrica 3)  !  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    Physical Exam  General:  NAD Lungs:  Course breath sounds, Fair air movement, no intercostal retractions, no accessory muscle use, no crackles, and no wheezes.   Heart:  Regular rate and rhythm; no murmurs, rubs,  or bruits. Abdomen:  soft, non-tender, and normal bowel sounds.   Msk:  Multiple tenderpoints in upper back and neck. Pain on flex/ext  of neck.  Pulses:  normal Extremities:  pale fingertips, dp palpable  Neurologic:  alert & oriented X3, cranial nerves II-XII intact, and strength normal in all extremities.   Skin:  no rashes.   Psych:  Oriented X3, memory intact for recent and remote, normally interactive, good eye contact, and not anxious appearing.     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 The lesion appears to have a very thick scale, and is friable and bleeding around teh edges. She has a history of extensive sun exposure and thin hair so I will send her to derm for a skin evaluation and treatment of teh lesion.  Orders: Dermatology Referral (Derma)  Problem # 2:  COPD (ICD-496) Arsenia has not had recent PFTs and I am concerned that she has worsening of her Chronic Obstructive Lung Disease. She is not on optimal therapy for her disease curently but tells me that she used Advair in the past but got thrush infections. She has decreased tolerance for physical activity. WIll start with PFTs and at her next visit optimze her inhalers in hopes of slwoing her disease progression. She is serious today about smoking cessation. We discussed smoking cessation strategies including medications and behavior modification options.   Her updated medication list for this problem includes:    Combivent 103-18 Mcg/act Aero (Ipratropium-albuterol) .Marland Kitchen... 2 puffs three times a day  Orders: PFT Baseline-Pre/Post Bronchodiolator (PFT Baseline-Pre/Pos)  Problem # 3:  CHRONIC PAIN SYNDROME (ICD-338.4) Asna has chronic msk pain mostly from her C-Spine where she sustain multiple past injuries and has fairly extensive DDD. She has been on Duragesic now with excellent pain control for over a year. We have been changing around her pain medication for breakthrough pain often. Today she says that the vicodin is not helpful. I have agreed to switch it to  oxycodone for one month to see if this helps. I reminded her that she should only use this when needed and to bring her pill bottles to her next visit so we can see how much she is needing and adjust her meds accordingly.  Problem # 4:  ANXIETY (ICD-300.00) Will continue Xanax as needed. I again discussed with her how to take this medication and risks of overuse. Her updated medication list for this problem includes:    Alprazolam 1 Mg Tabs (Alprazolam) .Marland Kitchen... Take 1 tablet by mouth three times a day as needed for anxiety  Complete Medication List: 1)  Alprazolam 1 Mg Tabs (Alprazolam) .... Take 1 tablet by mouth three times a day as needed for anxiety 2)  Nexium 40 Mg Cpdr (Esomeprazole magnesium) .... Take 1 tablet by mouth once a day 3)  Combivent 103-18 Mcg/act Aero (Ipratropium-albuterol) .... 2 puffs three times a day 4)  Oxycodone-acetaminophen 5-325 Mg Tabs (Oxycodone-acetaminophen) .... Take 1-2 tabs by mouth q6 hours as needed for breakthrough pain 5)  Polyethylene Glycol 3350 Powd (Polyethylene glycol 3350) .... Use 17 grams in 10 oz of water once or twice daily for constipation  titrate as needed 6)  Duragesic-75 75 Mcg/hr Pt72 (Fentanyl) .... Apply one patch as directed every 48 hours (new instructions) 7)  Tens Home Unit (nerve Stimulator)  .... Use as directed to affected areas two times a day as needed for muscle spasm 8)  Megace Es 625 Mg/70ml Susp (Megestrol acetate) .... 625mg (5ml) by mouth daily as directed  Patient Instructions: 1)  Please schedule a follow-up appointment in 2 months. Prescriptions: DURAGESIC-75 75 MCG/HR PT72 (FENTANYL) Apply one patch as directed every 48 hours (new instructions)  #16 x 0   Entered and Authorized by:   Julaine Fusi  DO   Signed by:   Julaine Fusi  DO on 09/10/2009   Method used:   Print then Give to Patient   RxID:   [id number] DURAGESIC-75 75 MCG/HR PT72 (FENTANYL) Apply one patch as directed every 48 hours (new instructions)  #16  x 0   Entered and Authorized by:   Julaine Fusi  DO   Signed by:   Julaine Fusi  DO on 09/10/2009   Method used:   Print then Give to Patient   RxID:   [id number] OXYCODONE-ACETAMINOPHEN 5-325 MG TABS (OXYCODONE-ACETAMINOPHEN) Take 1-2 tabs by mouth q6 hours as needed for breakthrough pain  #90 x 0   Entered and Authorized by:   Julaine Fusi  DO   Signed by:   Julaine Fusi  DO on 09/10/2009   Method used:   Print then Give to Patient   RxID:   [id number]    Orders Added: 1)  Dermatology Referral [Derma] 2)  PFT Baseline-Pre/Post Bronchodiolator [PFT Baseline-Pre/Pos] 3)  Est. Patient Level IV [[address]]   Prevention & Chronic Care Immunizations   Influenza vaccine: Fluvax MCR  (04/30/2008)   Influenza vaccine deferral: Refused  (09/10/2009)    Tetanus booster: Not documented   Td booster deferral: Deferred  (09/10/2009)    Pneumococcal vaccine: Not documented  Colorectal Screening   Hemoccult: Not documented    Colonoscopy: Not documented  Other Screening   Pap smear: Not documented    Mammogram: ASSESSMENT: Negative - BI-RADS 1^MM DIGITAL SCREENING  (01/07/2009)   Mammogram due: 11/2008   Smoking status: current  (09/10/2009)   Smoking cessation counseling: yes  (09/10/2009)  Lipids   Total Cholesterol: 186  (10/27/2008)   LDL: 117  (10/27/2008)   LDL Direct: Not documented   HDL: 43  (10/27/2008)   Triglycerides: 132  (10/27/2008)

## 2010-08-31 NOTE — Assessment & Plan Note (Signed)
Summary: per dr Phillips Odor, see note/pcp-golding/hla   Vital Signs:  Patient profile:   50 year old female Height:      67 inches (170.18 cm) Weight:      104.1 pounds (48.27 kg) BMI:     16.36 Temp:     99.2 degrees F (37.33 degrees C) oral Pulse rate:   99 / minute BP sitting:   111 / 74  (right arm) Cuff size:   small  Vitals Entered By: Theotis Barrio NT II (October 08, 2009 3:41 PM) CC: PAIN SHOULDER AND BACK  /  MEDICATION REFILL, Depression Is Patient Diabetic? No Pain Assessment Patient in pain? yes     Location: SHOULDERS/BACK Intensity:     6 Type: ACHIG Onset of pain  Chronic Nutritional Status BMI of < 19 = underweight  Does patient need assistance? Functional Status Self care Ambulation Normal Comments PAIN IN SHOULDERS AND BACK  / MEDICATION REFILL   Primary Care Provider:  Julaine Fusi  DO  CC:  PAIN SHOULDER AND BACK  /  MEDICATION REFILL and Depression.  History of Present Illness: [name] comes to the hospital for an urgent review. She went home yesterday after a recent hospital admission, suppposedly had a "sz" in ER (?opiate withdrawal) and COPD exac. She feels weak but doing "fine". Her HH RN noted low BP and tachycardia and hence she is in here for evaluation. She doesn't have any new complaints, thinks she is getting better, eating okay. she took jello and ice cream.  --feels about 75% of her normal self. her breathing is geting better.  --wants her pain meds refilled --wants some nicotine patch to help quitting  Depression History:      The patient denies a depressed mood most of the day and a diminished interest in her usual daily activities.         Preventive Screening-Counseling & Management  Alcohol-Tobacco     Smoking Status: current     Smoking Cessation Counseling: yes     Packs/Day: 1 ppd     Year Started: AT THE AGE OF [age]  Caffeine-Diet-Exercise     Does Patient Exercise: no  Current Medications (verified): 1)  Alprazolam  1 Mg  Tabs (Alprazolam) .... Take 1 Tablet By Mouth Three Times A Day As Needed For Anxiety 2)  Nexium 40 Mg  Cpdr (Esomeprazole Magnesium) .... Take 1 Tablet By Mouth Once A Day 3)  Combivent 103-18 Mcg/act Aero (Ipratropium-Albuterol) .... 2 Puffs Three Times A Day 4)  Oxycodone-Acetaminophen 5-325 Mg Tabs (Oxycodone-Acetaminophen) .... Take 1-2 Tabs By Mouth Q6 Hours As Needed For Breakthrough Pain 5)  Polyethylene Glycol 3350  Powd (Polyethylene Glycol 3350) .... Use 17 Grams in 10 Oz of Water Once or Twice Daily For Constipation  Titrate As Needed 6)  Duragesic-75 75 Mcg/hr Pt72 (Fentanyl) .... Apply One Patch As Directed Every 48 Hours (New Instructions) 7)  Tens Home Unit  (Nerve Stimulator) .... Use As Directed To Affected Areas Two Times A Day As Needed For Muscle Spasm 8)  Megace Es 625 Mg/85ml Susp (Megestrol Acetate) .... 625mg (5ml) By Mouth Daily As Directed 9)  Advair Diskus 100-50 Mcg/dose Misc (Fluticasone-Salmeterol) .Marland Kitchen.. 1 Puff 2 Times Daily 10)  Spiriva Handihaler 18 Mcg  Caps (Tiotropium Bromide Monohydrate) .... Contents of One Capsule Inhaled Daily 11)  Proair Hfa 108 (90 Base) Mcg/act Aers (Albuterol Sulfate) .... Take 1-2 Puffs Every 4 Hours As Needed For Shortness of Breath 12)  Zyrtec Allergy 10 Mg Tabs (  Cetirizine Hcl) .... Take 1 Tablet By Mouth Once A Day 13)  Cymbalta 30 Mg Cpep (Duloxetine Hcl) .... Take One Tablet Once Daily For One Week Than Increase As Directed. 14)  Doxycycline Hyclate 100 Mg Caps (Doxycycline Hyclate) .... Take 1 Tablet By Mouth Two Times A Day For 7 More Days With Meals 15)  Promethazine Hcl 25 Mg Tabs (Promethazine Hcl) .... Take 1 Tablet By Mouth Four Times A Day As Needed 16)  Nicoderm Cq 21 Mg/24hr Pt24 (Nicotine) .... One Patch A Day. Do Not Smoke While On This Patch.  Allergies (verified): 1)  ! * Ambien 2)  ! Lyrica 3)  ! Prednisone  Past History:  Past Medical History: Last updated:  01/02/2009 Anxiety Depression Headache Cervical Cancer Hx- treated by Dr. 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  Lungs:  normal respiratory effort and normal breath sounds.   Heart:  bordelrine tachy, no murmur.   Abdomen:  soft and non-tender.   Pulses:  normal peripheral pulses  Extremities:  no cyanosis, clubbing or edema  Neurologic:  non focal.  Psych:  normally interactive.     Impression & Recommendations:  Problem # 1:  COPD (ICD-496) Exac resolved. Continue with chronic meds.  Her updated medication list for this problem includes:    Combivent 103-18 Mcg/act Aero (Ipratropium-albuterol) .Marland Kitchen... 2 puffs three times a day    Advair Diskus 100-50 Mcg/dose Misc (Fluticasone-salmeterol) .Marland Kitchen... 1 puff 2 times daily    Spiriva Handihaler 18 Mcg Caps (Tiotropium bromide monohydrate) .Marland Kitchen... Contents of one capsule inhaled daily    Proair Hfa 108 (90 Base) Mcg/act Aers (Albuterol sulfate) .Marland Kitchen... Take 1-2 puffs every 4 hours as needed for shortness of breath  Problem # 2:  CHRONIC PAIN SYNDROME (ICD-338.4) Pain meds refilled. She will be reviewed by PCP soon.    Problem # 3:  WEIGHT LOSS-ABNORMAL (ICD-783.21) Will be reviewed by PCP Mar 30. No changes today.   Problem # 4:  TOBACCO ABUSE (ICD-305.1) Willing to quit. Nicoderm prescribed.  Her updated medication list for this problem includes:    Nicoderm Cq 21 Mg/24hr Pt24 (Nicotine) ..... One patch a day. do not smoke while on this patch.  Complete Medication List: 1)  Alprazolam 1 Mg Tabs (Alprazolam) .... Take 1 tablet by mouth three times a day as needed for anxiety 2)  Nexium 40 Mg Cpdr (Esomeprazole magnesium) .... Take 1 tablet by mouth once a day 3)  Combivent 103-18 Mcg/act Aero (Ipratropium-albuterol) .... 2 puffs three times a day 4)  Oxycodone-acetaminophen 5-325 Mg Tabs (Oxycodone-acetaminophen) .... Take 1-2 tabs by mouth q6 hours as needed for breakthrough pain 5)  Polyethylene Glycol 3350 Powd (Polyethylene glycol 3350) .Marland KitchenMarland KitchenMarland Kitchen  Use 17 grams in 10 oz of water once or twice daily for constipation  titrate as needed 6)  Duragesic-75 75 Mcg/hr Pt72 (Fentanyl) .... Apply one patch as directed every 48 hours (new instructions) 7)  Tens Home Unit (nerve Stimulator)  .... Use as directed to affected areas two times a day as needed for muscle spasm 8)  Megace Es 625 Mg/72ml Susp (Megestrol acetate) .... 625mg (5ml) by mouth daily as directed 9)  Advair Diskus 100-50 Mcg/dose Misc (Fluticasone-salmeterol) .Marland Kitchen.. 1 puff 2 times daily 10)  Spiriva Handihaler 18 Mcg Caps (Tiotropium bromide monohydrate) .... Contents of one capsule inhaled daily 11)  Proair Hfa 108 (90 Base) Mcg/act Aers (Albuterol sulfate) .... Take 1-2 puffs every 4 hours as needed for shortness of breath 12)  Zyrtec Allergy 10 Mg Tabs (Cetirizine hcl) .... Take 1 tablet by mouth once a day 13)  Cymbalta 30 Mg Cpep (Duloxetine hcl) .... Take one tablet once daily for one week than increase as directed. 14)  Doxycycline Hyclate 100 Mg Caps (Doxycycline hyclate) .... Take 1 tablet by mouth two times a day for 7 more days with meals 15)   Promethazine Hcl 25 Mg Tabs (Promethazine hcl) .... Take 1 tablet by mouth four times a day as needed 16)  Nicoderm Cq 21 Mg/24hr Pt24 (Nicotine) .... One patch a day. do not smoke while on this patch.  Patient Instructions: 1)  Please keep up your Mar 30 appointment with Dr. Phillips Odor, @ 3 30 PM. 2)  Do let us know if your problem worsens.   Prescriptions: NICODERM CQ 21 MG/24HR PT24 (NICOTINE) One patch a day. Do not smoke while on this patch.  #15 x 2   Entered and Authorized by:   Latoya Council MD   Signed by:   Latoya Council MD on 10/08/2009   Method used:   Print then Give to Patient   RxID:   [id number]    Prevention & Chronic Care Immunizations   Influenza vaccine: Fluvax MCR  (04/30/2008)   Influenza vaccine deferral: Refused  (09/10/2009)    Tetanus booster: Not documented   Td booster deferral: Deferred  (09/10/2009)    Pneumococcal vaccine: Not documented  Colorectal Screening   Hemoccult: Not documented    Colonoscopy: Not documented  Other Screening   Pap smear: Not documented    Mammogram: ASSESSMENT: Negative - BI-RADS 1^MM DIGITAL SCREENING  (01/07/2009)   Mammogram due: 11/2008   Smoking status: current  (10/08/2009)   Smoking cessation counseling: yes  (10/08/2009)  Lipids   Total Cholesterol: 186  (10/27/2008)   LDL: 117  (10/27/2008)   LDL Direct: Not documented   HDL: 43  (10/27/2008)   Triglycerides: 132  (10/27/2008)

## 2010-08-31 NOTE — Miscellaneous (Signed)
Summary: Hospital Admission  INTERNAL MEDICINE ADMISSION HISTORY AND PHYSICAL PCP:Dr. Phillips Odor  R1:Ataya Murdy #[phone] R2Cena Benton [phone] Attending: Dr. Doneen Poisson   CC:SOB for 1 week  [name] is a 50 year old Female with COPD and 33 pack year history of smoking, presented to ED with increased SOB and dry cough that started 7 days ago.  Has been feeling cold, hot, tired for the past 4-5 days. No sick contact / travel. Dog passed away recently and she is feeling very depressed about all this.  Yesterday she had 100 degrees fever. Has runny nose, and feels real hot and sweaty. No chest pain or shortness of breath. She was given Z-pack on the 09/30/2009. She is on day#3/5 of antibiotic therapy. over the phone two days ago, she is taking them since yesterday. She was also started on Spiriva, Advair and Albuterol since 09/30/2009. Denies CP, palpitations, orthopnea, PND, leg swelling or prolonged immobilization. Received influenza vaccination this season. She also reports poor appetite and a weight loss of 40 lbs since 09/2008. Patient had a full GI workup including EGD and colonoscopy that were normal.  ALLERGIES: * AMBIEN ->hives ! LYRICA ! PREDNISONE  PAST MEDICAL HISTORY: Past Medical History: Anxiety Depression Headache Cervical Cancer Hx- treated by Dr. Debby Bud Chronic Back Pain, goes to see orthopedic doctor, who also prescribes pain medicines to her COPD Arthritis   MEDICATIONS: Current Meds:  ALPRAZOLAM 1 MG  TABS (ALPRAZOLAM) Take 1 tablet by mouth three times a day as needed for anxiety NEXIUM 40 MG  CPDR (ESOMEPRAZOLE MAGNESIUM) Take 1 tablet by mouth once a day COMBIVENT 103-18 MCG/ACT AERO (IPRATROPIUM-ALBUTEROL) 2 puffs three times a day OXYCODONE-ACETAMINOPHEN 5-325 MG TABS (OXYCODONE-ACETAMINOPHEN) Take 1-2 tabs by mouth q6 hours as needed for breakthrough pain POLYETHYLENE GLYCOL 3350  POWD (POLYETHYLENE GLYCOL 3350) use 17 grams in 10 oz of water once  or twice daily for constipation  titrate as needed DURAGESIC-75 75 MCG/HR PT72 (FENTANYL) Apply one patch as directed every 48 hours (new instructions) * TENS HOME UNIT  (NERVE STIMULATOR) Use as directed to affected areas two times a day as needed for muscle spasm MEGACE ES 625 MG/5ML SUSP (MEGESTROL ACETATE) 625mg (5ml) by mouth daily as directed ADVAIR DISKUS 100-50 MCG/DOSE MISC (FLUTICASONE-SALMETEROL) 1 puff 2 times daily SPIRIVA HANDIHALER 18 MCG  CAPS (TIOTROPIUM BROMIDE MONOHYDRATE) contents of one capsule inhaled daily PROAIR HFA 108 (90 BASE) MCG/ACT AERS (ALBUTEROL SULFATE) Take 1-2 puffs every 4 hours as needed for shortness of breath AZITHROMYCIN 250 MG  TABS (AZITHROMYCIN) 2 by  mouth today and then 1 daily for 4 days ZYRTEC ALLERGY 10 MG TABS (CETIRIZINE HCL) Take 1 tablet by mouth once a day CYMBALTA 30 MG CPEP (DULOXETINE HCL) Take one tablet once daily for one week than increase as directed.   SOCIAL HISTORY: Social History: Disabled- Worked at a [employer] for 10 years 10 grade completed Live alone Smoke 1 ppd, 33years No Alcohol use Cocaine abuse- quit 14+ years Active in her Church Daily Caffeine Use: 2 cups daily    FAMILY HISTORY Family History: Mother - Stroke at 56- deceased Brother - Lung Cancer- 66- deceased Father- MI -31- deceased No FH of Colon Cancer: Family History of Diabetes: Mother   ROS: per HPI  VITALS:  T:98.1  P:105  BP:114/73  R:  18 O2SAT: 96% ON:RA PHYSICAL EXAM: General:  alert, well-developed, and cooperative to examination.   Head:  normocephalic and atraumatic.   Eyes:  vision grossly intact, pupils equal,  pupils round, pupils reactive to light, no injection and anicteric.   Mouth:  pharynx pink and moist, no erythema, and no exudates. Poor dentition.  Neck:  supple, full ROM, no thyromegaly, no JVD, and no carotid bruits.   Lungs:  normal respiratory effort, no accessory muscle use, mild expiratory wheezing in upper lobes, no  crackles. CV: RRR, no M, S3, S4, no lifts or rubs. Abdomen: ND; BS+, NTTP, no HSM MSK: FROM of all joints; no joint effusion, erythema or increase in warmth with touch. Neurologic:  alert & oriented X3, cranial nerves II-XII intact, strength normal in all extremities, sensation intact to light touch, and gait normal.   Skin:  turgor normal and no rashes. Right shoulder tattoo.  Psych:  Oriented X3, memory intact for recent and remote, normally interactive, good eye contact, not anxious appearing, and not depressed appearing.  LABS: CKMB, POC                                <1.0       l      1.0-8.0          ng/mL  Troponin I, POC                          <0.05             0.00-0.09        ng/mL  Myoglobin, POC                           69.4              12-200           ng/mL  pH, Blood Gas                            7.450      h      7.350-7.400  pCO2                                     42.8              35.0-45.0        mmHg  pO2, Blood Gas                           172.0      h      80.0-100.0       mmHg  Bicarbonate                              29.8       h      20.0-24.0        mEq/L  TCO2                                     31                0-100            mmol/L  Acid-Base Excess  5.0        h      0.0-2.0          mmol/L  Oxygen Saturation                        100.0                              %  Protime ( Prothrombin Time)              12.5              11.6-15.2        seconds INR                                      0.94              0.00-1.49  WBC                                      5.0               4.0-10.5         K/uL  RBC                                      4.06              3.87-5.11        MIL/uL  Hemoglobin (HGB)                         12.9              12.0-15.0        g/dL  Hematocrit (HCT)                         39.6              36.0-46.0        %  MCV                                      97.7              78.0-100.0       fL  MCHC                                      32.7              30.0-36.0        g/dL  RDW                                      12.7              11.5-15.5        %  Platelet Count (PLT)  280               150-400          K/uL  Neutrophils, %                           53                43-77            %  Lymphocytes, %                           37                12-46            %  Monocytes, %                             8                 3-12             %  Eosinophils, %                           2                 0-5              %  Basophils, %                             1                 0-1              %  Neutrophils, Absolute                    2.6               1.7-7.7          K/uL  Lymphocytes, Absolute                    1.8               0.7-4.0          K/uL  Monocytes, Absolute                      0.4               0.1-1.0          K/uL  Eosinophils, Absolute                    0.1               0.0-0.7          K/uL  Basophils, Absolute                      0.0               0.0-0.1          K/uL   TCO2  30                0-100            mmol/L  Ionized Calcium                          1.15              1.12-1.32        mmol/L  Hemoglobin (HGB)                         12.9              12.0-15.0        g/dL  Hematocrit (HCT)                         38.0              36.0-46.0        %  Sodium (NA)                              140               135-145          mEq/L  Potassium (K)                            3.7               3.5-5.1          mEq/L  Chloride                                 102               96-112           mEq/L  Glucose                                  95                70-99            mg/dL  BUN                                      14                6-23             mg/dL  Creatinine                               0.4               0.4-1.2          mg/dL  CHEST - 2 VIEW    Comparison: Chest x-ray of  04/17/2009    Findings: The lungs remain clear and hyperaerated.  Mediastinal   contours are stable.  The heart is within normal limits in size.   No bony abnormality is seen.    IMPRESSION:   Stable  chest x-ray with hyperaeration.  No active lung disease.  ASSESSMENT AND PLAN: (1) COPD exacerbation, mild -- Duoneb Tx q 6 hrs; continue with Advair, Spiriva. Patient is alergic to steroids. Will start  on doxycycline by mouth. Will recheck ABG's (2) Dehydration. Will replete with IVF. (3) Metabolic alkalosis, likely 2/2 problem #2. will check Urine Chloride. (4) Failure to thrive, unclear etiology. GI work up negative as of 12/2008. Megace for appetitie; high protein diet. (5) Depression/anxiety. Stable. Will continueCymbalta and Xanax.(6)Tobacco abuse: smoking cessation counseling and Nicotine TD. (7) GERD: will treat with Protonix. Continue home medications ()VTE PROPH: lovenox

## 2010-08-31 NOTE — Progress Notes (Signed)
Summary: Nuc. Pre-Procedure  Phone Note Outgoing Call Call back at [facility] Phone [phone]   Call placed by: Irean Hong, RN,  May 26, 2010 1:18 PM Summary of Call: Left message with information on [employer] Information Sheet (see scanned document for details).      Nuclear Med Background Indications for Stress Test: Evaluation for Ischemia, Post Hospital  Indications Comments: 04/29/10 [facility]: CP,SOB (-) enzymes.  History: COPD, Echo  History Comments: 04/30/10 Echo: EF=60-65%.  Symptoms: Chest Pain, SOB    Nuclear Pre-Procedure Cardiac Risk Factors: Family History - CAD, Smoker Height (in): 67

## 2010-08-31 NOTE — Assessment & Plan Note (Signed)
Summary: EST-CK/FU/MEDS/CFB   Vital Signs:  Patient profile:   50 year old female Height:      67 inches (170.18 cm) Weight:      121 pounds (55.00 kg) BMI:     19.02 Temp:     96.9 degrees F (36.06 degrees C) oral Pulse rate:   65 / minute BP sitting:   130 / 81  (right arm)  Vitals Entered By: Angelina Ok RN (January 14, 2010 11:00 AM) CC: Depression Is Patient Diabetic? No Pain Assessment Patient in pain? yes     Location: joints Intensity: 7-8 Type: aching Onset of pain  Constant Nutritional Status BMI of 19 -24 = normal  Have you ever been in a relationship where you felt threatened, hurt or afraid?No   Does patient need assistance? Functional Status Self care Ambulation Normal Comments increased joint pain since Sunday, also complains that lymph nodes feel swollen.  Congestion with cough, none productive.  Has had chills for 2 days.   Primary Care Provider:  Julaine Fusi  DO  CC:  Depression.  History of Present Illness: [name] comes in today c/o chills, cold sweating, and diffuse muscle aches and pains- acute onset Sunday, profuse night sweats, swollen Lymph nodes in neck, diarrhea started yesterday, pain "all over", and weakness. Decreased appetite. Weight loss has stabilized. Was doing well for about the last 2-3 months. Noticed an increase in dyspnea even with short distances. No sick contacts. No recent travel. Until this illness her chronic pain has been well controlled and functional staus improved. She denies any chest pain, dizziness or nausea vommitting. No blood in stools. She has notices swollen lymph nodes in her neck.  Depression History:      The patient denies a depressed mood most of the day but notes a diminished interest in her usual daily activities.        The patient denies that she feels like life is not worth living, denies that she wishes that she were dead, and denies that she has thought about ending her life.        Comments:  due to illness  and pain.   Preventive Screening-Counseling & Management  Alcohol-Tobacco     Smoking Status: current     Smoking Cessation Counseling: yes     Packs/Day: 1 ppd     Year Started: AT THE AGE OF [age]  Current Medications (verified): 1)  Alprazolam 1 Mg  Tabs (Alprazolam) .... Take 1 Tablet By Mouth Three Times A Day As Needed For Anxiety 2)  Nexium 40 Mg  Cpdr (Esomeprazole Magnesium) .... Take 1 Tablet By Mouth Once A Day 3)  Norco 10-325 Mg Tabs (Hydrocodone-Acetaminophen) .... Take 1 Tablet By Mouth Every 6 Hours 4)  Polyethylene Glycol 3350  Powd (Polyethylene Glycol 3350) .... Use 17 Grams in 10 Oz of Water Once or Twice Daily For Constipation  Titrate As Needed 5)  Duragesic-75 75 Mcg/hr Pt72 (Fentanyl) .... Apply One Patch As Directed Every 48 Hours (New Instructions) 6)  Megace Es 625 Mg/84ml Susp (Megestrol Acetate) .... 625mg (5ml) By Mouth Daily As Directed 7)  Advair Diskus 100-50 Mcg/dose Misc (Fluticasone-Salmeterol) .Marland Kitchen.. 1 Puff 2 Times Daily 8)  Spiriva Handihaler 18 Mcg  Caps (Tiotropium Bromide Monohydrate) .... Contents of One Capsule Inhaled Daily 9)  Proair Hfa 108 (90 Base) Mcg/act Aers (Albuterol Sulfate) .... Take 1-2 Puffs Every 4 Hours As Needed For Shortness of Breath 10)  Zyrtec Allergy 10 Mg Tabs (Cetirizine Hcl) .... Take 1  Tablet By Mouth Once A Day 11)  Promethazine Hcl 25 Mg Tabs (Promethazine Hcl) .... Take 1 Tablet By Mouth Four Times A Day As Needed 12)  Nicoderm Cq 21 Mg/24hr Pt24 (Nicotine) .... One Patch A Day. Do Not Smoke While On This Patch. 13)  Avelox 400 Mg Tabs (Moxifloxacin Hcl) .... Take 1 Tablet By Mouth Once A Day 14)  Methylprednisolone (Pak) 4 Mg Tabs (Methylprednisolone) .... Dose Pak- 6 Day Taper, Take As Directed  Allergies (verified): 1)  ! * Ambien 2)  ! Lyrica 3)  ! Prednisone  Past History:  Family History: Last updated: 01/08/2009 Mother - Stroke at 2- deceased Brother - Lung Cancer- 22- deceased Father- MI -39-  deceased No FH of Colon Cancer: Family History of Diabetes: Mother  Past Medical History: Anxiety Depression Headache Cervical Cancer Hx- treated by Dr. Debby Bud Chronic Back Pain C-Spine radiculopathy, DJD COPD Arthritis Myofascial Pain Syndrome  Review of Systems      See HPI  Physical Exam  General:  Wrapped in blanket in exam room. Appears uncomfortable and ill but not in deistress. Head:  normocephalic and no abnormalities observed. Scalp psoraisis.   Nose:  clear Mouth:  Vvery poor dentition, no throat erythema Neck:  Large anterior cervical lymph node on right, small left anterior lymph nodes, mildly tender to palpation, thyroid galnd does not seem enlarged Chest Wall:  tender to palpation diffusely Lungs:  Scattered weezing. Faster than normal respirations. Crakles in right lung base, but cleared with deep coughing and expectoration. sputum light yellow. Heart:  Mily tachy. But regular, no mrg Abdomen:  soft, non-tender, normal bowel sounds, no distention, and no masses.   Msk:  Multiple tenderpoints located in upper back, neck and thorco-scapular region. Pulses:  normal. Extremities:  no cyanosis or pitting edema Neurologic:  alert & oriented X3 and strength normal in all extremities.   Skin:  Dark skin with solar damage, scattered areas of mild psoriasis. No other rashes. Cervical Nodes:  postive see neck exam Axillary Nodes:  none Psych:  depressed appearing   Impression & Recommendations:  Problem # 1:  DYSPNEA (ICD-786.05) This is likely a mild Chronic Obstructive Lung Disease exacerbation with an acute bronchitis. I favor viral etiology given her diffuse muscle pains, diarrhea and otehr systemic complaints. The acute onset suggests infection. I am concerned about her night sweats, chills, and tremulousness today for a more ominous process. She has been losing weight for many months and I have been unable to demonstarte a cause other than prgressive  Chronic Obstructive Lung Disease and Chronic pain/Depression.  She continues to smoke and has a family  history of MI so other sources of her illness such as cardio-pulmonary dx need to be considered. Additionally I will get blood work to check for thyroid dx and also check CBC for anmiea or hematologic problems. May be reasonable to check and HIV, even though patient denies risk factors. Prior CT scanning of chest has been neg for malignancy. Autoimmune disease work-ups have also been negative. Will get CXR to r/o pna. She is not febrile today, and is actually hypotheric by our reading today at 96.7.   O2 Sats are 98 %. Will treat with steroid dose pak, 7 days of avelox to cover for bacterial bronchitis early PNA. F/u in 2 weekis sooner if not better. Will get CXR and lab work stat.   If lab work is unrevealing and she does not improve will need to do more comprehensive ID, automimmune,malignancy  w/u.  Orders: T-CBC w/Diff ([id number]) T-Comprehensive Metabolic Panel ([id number]) T-Chest x-ray, 2 views (30865)  Problem # 2:  COUGH (ICD-786.2) as above.  Problem # 3:  CHRONIC PAIN SYNDROME (ICD-338.4) Refilled her pain meds today. IM Toradol for pain. Orders: Admin of Therapeutic Inj  intramuscular or subcutaneous (78469) Ketorolac-Toradol 15mg  (G2952)  Complete Medication List: 1)  Alprazolam 1 Mg Tabs (Alprazolam) .... Take 1 tablet by mouth three times a day as needed for anxiety 2)  Nexium 40 Mg Cpdr (Esomeprazole magnesium) .... Take 1 tablet by mouth once a day 3)  Norco 10-325 Mg Tabs (Hydrocodone-acetaminophen) .... Take 1 tablet by mouth every 6 hours 4)  Polyethylene Glycol 3350 Powd (Polyethylene glycol 3350) .... Use 17 grams in 10 oz of water once or twice daily for constipation  titrate as needed 5)  Duragesic-75 75 Mcg/hr Pt72 (Fentanyl) .... Apply one patch as directed every 48 hours (new instructions) 6)  Megace Es 625 Mg/33ml Susp (Megestrol acetate) .... 625mg (5ml) by  mouth daily as directed 7)  Advair Diskus 100-50 Mcg/dose Misc (Fluticasone-salmeterol) .Marland Kitchen.. 1 puff 2 times daily 8)  Spiriva Handihaler 18 Mcg Caps (Tiotropium bromide monohydrate) .... Contents of one capsule inhaled daily 9)  Proair Hfa 108 (90 Base) Mcg/act Aers (Albuterol sulfate) .... Take 1-2 puffs every 4 hours as needed for shortness of breath 10)  Zyrtec Allergy 10 Mg Tabs (Cetirizine hcl) .... Take 1 tablet by mouth once a day 11)  Promethazine Hcl 25 Mg Tabs (Promethazine hcl) .... Take 1 tablet by mouth four times a day as needed 12)  Nicoderm Cq 21 Mg/24hr Pt24 (Nicotine) .... One patch a day. do not smoke while on this patch. 13)  Avelox 400 Mg Tabs (Moxifloxacin hcl) .... Take 1 tablet by mouth once a day 14)  Methylprednisolone (pak) 4 Mg Tabs (Methylprednisolone) .... Dose pak- 6 day taper, take as directed  Other Orders: T-Urinalysis ([id number]) T-Culture, Urine ([id number]) T-TSH ([id number]) T-T4, Free (87564-33295) Albuterol Sulfate Sol 1mg  unit dose (J8841) Levaalbuterol inhalation sol. unit dose 0.5 MG (Y6063) Levaalbuterol inhalation sol. unit dose 0.5 MG (K1601) Nebulizer Tx (09323)  Patient Instructions: 1)  Needs re-evaluation in 1-2 weeks if symtoms not improved or condition get worse. 2)  Please schedule a follow-up appointment in 3 months with [facility] for routine care. Prescriptions: NORCO 10-325 MG TABS (HYDROCODONE-ACETAMINOPHEN) Take 1 tablet by mouth every 6 hours  #90 x 0   Entered and Authorized by:   Julaine Fusi  DO   Signed by:   Julaine Fusi  DO on 01/14/2010   Method used:   Print then Give to Patient   RxID:   [id number] DURAGESIC-75 75 MCG/HR PT72 (FENTANYL) Apply one patch as directed every 48 hours (new instructions)  #16 x 0   Entered and Authorized by:   Julaine Fusi  DO   Signed by:   Julaine Fusi  DO on 01/14/2010   Method used:   Print then Give to Patient   RxID:   [id number] AVELOX 400 MG TABS (MOXIFLOXACIN HCL)  Take 1 tablet by mouth once a day  #7 x 0   Entered and Authorized by:   Julaine Fusi  DO   Signed by:   Julaine Fusi  DO on 01/14/2010   Method used:   Electronically to        [facility] [address] [phone]* (retail)       [address]       [place]  [place], [place]  [address]       Ph: [phone] or [phone]       Fax: [phone]   RxID:   [id number] METHYLPREDNISOLONE (PAK) 4 MG TABS (METHYLPREDNISOLONE) Dose pak- 6 day taper, take as directed  #1 pak x 0   Entered and Authorized by:   Julaine Fusi  DO   Signed by:   Julaine Fusi  DO on 01/14/2010   Method used:   [facility] to        [facility] [address] [phone]* (retail)       [address]       [place], [place]  [address]       Ph: [phone] or [phone]       Fax: [phone]   RxID:   [id number] DURAGESIC-75 75 MCG/HR PT72 (FENTANYL) Apply one patch as directed every 48 hours (new instructions)  #16 x 0   Entered and Authorized by:   Julaine Fusi  DO   Signed by:   Julaine Fusi  DO on 01/14/2010   Method used:   Print then Give to Patient   RxID:   [id number] NORCO 10-325 MG TABS (HYDROCODONE-ACETAMINOPHEN) Take 1 tablet by mouth every 6 hours  #90 x 0   Entered and Authorized by:   Julaine Fusi  DO   Signed by:   Julaine Fusi  DO on 01/14/2010   Method used:   Print then Give to Patient   RxID:   [id number] AVELOX 400 MG TABS (MOXIFLOXACIN HCL) Take 1 tablet by mouth once a day  #7 x 0   Entered and Authorized by:   Julaine Fusi  DO   Signed by:   Julaine Fusi  DO on 01/14/2010   Method used:   Print then Give to Patient   RxID:   [id number]   Prevention & Chronic Care Immunizations   Influenza vaccine: Fluvax MCR  (04/30/2008)   Influenza vaccine deferral: Refused  (09/10/2009)    Tetanus booster: Not documented   Td booster deferral: Deferred  (09/10/2009)    Pneumococcal vaccine: Not documented  Colorectal Screening    Hemoccult: Not documented    Colonoscopy: Not documented  Other Screening   Pap smear: Not documented    Mammogram: ASSESSMENT: Negative - BI-RADS 1^MM DIGITAL SCREENING  (01/07/2009)   Mammogram due: 11/2008   Smoking status: current  (01/14/2010)   Smoking cessation counseling: yes  (01/14/2010)  Lipids   Total Cholesterol: 186  (10/27/2008)   LDL: 117  (10/27/2008)   LDL Direct: Not documented   HDL: 43  (10/27/2008)   Triglycerides: 132  (10/27/2008)  Process Orders Check Orders Results:     [facility]: Check successful Tests Sent for requisitioning (January 15, 2010 12:18 AM):     01/14/2010: [facility] -- T-CBC w/Diff [[id number]] (signed)     01/14/2010: [facility] -- T-Comprehensive Metabolic Panel [[id number]] (signed)     01/14/2010: [facility] -- T-Urinalysis [81003-65000] (signed)     01/14/2010: [facility] -- T-Culture, Urine [[id number]] (signed)     01/14/2010: [facility] -- T-TSH [phone] (signed)     01/14/2010: [facility] -- T-T4, Tandy Gaw [[id number]] (signed)     Vital Signs:  Patient profile:   50 year old female Height:      67 inches (170.18 cm) Weight:      121 pounds (55.00 kg) BMI:     19.02 Temp:  96.9 degrees F (36.06 degrees C) oral Pulse rate:   65 / minute BP sitting:   130 / 81  (right arm)  Vitals Entered By: Angelina Ok RN (January 14, 2010 11:00 AM)      Medication Administration  Injection # 1:    Medication: Ketorolac-Toradol 15mg     Diagnosis: CHRONIC PAIN SYNDROME (ICD-338.4)    Route: IM    Site: LUOQ gluteus    Exp Date: 01/2011    Lot #: [id number]    Mfr: Aleene Davidson LTD    Comments: Pt given Toradol 30 mg IM    Patient tolerated injection without complications    Given by: Angelina Ok RN (January 14, 2010 2:18 PM)  Medication # 1:    Medication: Ipratropium inhalation sol. unit dose     Diagnosis: COPD (ICD-496)    Dose: .5 mg    Route: inhaled    Exp Date: 10/2010    Lot #: [id number]    Mfr: Nephron    Patient tolerated medication without complications    Given by: Angelina Ok RN (January 14, 2010 2:22 PM)  Medication # 2:    Medication: Albuterol Sulfate Sol 1mg  unit dose    Diagnosis: COPD (ICD-496)    Dose: 2.5mg /3 ml    Route: inhaled    Exp Date: 06/2011    Lot #: [id number]    Mfr: Nephron    Patient tolerated medication without complications    Given by: Angelina Ok RN (January 14, 2010 2:24 PM)  Orders Added: 1)  T-CBC w/Diff [[id number]] 2)  Est. Patient Level IV [58099] 3)  T-Comprehensive Metabolic Panel [80053-22900] 4)  T-Urinalysis [81003-65000] 5)  T-Culture, Urine [[id number]] 6)  T-TSH [76734-19379] 7)  T-T4, Free [02409-73532] 8)  Admin of Therapeutic Inj  intramuscular or subcutaneous [96372] 9)  Ketorolac-Toradol 15mg  [J1885] 10)  Albuterol Sulfate Sol 1mg  unit dose [J7613] 11)  Levaalbuterol inhalation sol. unit dose 0.5 MG [J7614] 12)  Levaalbuterol inhalation sol. unit dose 0.5 MG [J7614] 13)  Nebulizer Tx [94640] 14)  T-Chest x-ray, 2 views [71020]

## 2010-08-31 NOTE — Assessment & Plan Note (Signed)
Summary: cough, cold, weak, tired x 1 wk/pcp-golding/hla   Vital Signs:  Patient profile:   50 year old female Height:      67 inches (170.18 cm) Weight:      106.2 pounds (52.27 kg) BMI:     16.69 Temp:     97.6 degrees F (36.44 degrees C) oral Pulse rate:   110 / minute BP sitting:   128 / 95  (right arm) Cuff size:   small  Vitals Entered By: Theotis Barrio NT II (September 30, 2009 1:39 PM) CC: DEPRESSED FOR THE LAST TWO DAYS  / COUGH -COLD WITH CONGESTION /  FEELS WEAK AND TIRED Is Patient Diabetic? No Pain Assessment Patient in pain? yes     Location: ALL OVER Intensity:        8 Type: ACHE Onset of pain  SINCE LAST WEEK Nutritional Status BMI of < 19 = underweight  Have you ever been in a relationship where you felt threatened, hurt or afraid?No   Does patient need assistance? Functional Status Self care Ambulation Normal Comments DEPRESSED FOR THE LAST TWO DAYS / COUGH - COLD WITH CONGESTION / FEELS TIRES AND WEAK   Primary Care Provider:  Julaine Fusi  DO  CC:  DEPRESSED FOR THE LAST TWO DAYS  / COUGH -COLD WITH CONGESTION /  FEELS WEAK AND TIRED.  History of Present Illness: [name] is a 50 year old Female with PMH/problems as outlined in the EMR.   Has been feeling cold, hot, tired for the past 4-5 days. No sick contact / travel. Dog passed away recently. Her dog had heart worm and she is feeling very depressed about all this.  Yesterday she had 100 degrees fever. Has runny nose, and feels real hot and sweaty. No chest pain or shortness of breath. She was given some meds over the phone two days ago, she is taking them since yesterday. She is taking the abx pills, but she is confused about how to do the inhaler.     Depression History:      The patient is having a depressed mood most of the day and has a diminished interest in her usual daily activities.        The patient denies that she feels like life is not worth living, denies that she wishes that she  were dead, and denies that she has thought about ending her life.        Comments:  FOR THE LAST TWO DAYS.   Preventive Screening-Counseling & Management  Alcohol-Tobacco     Smoking Status: current     Smoking Cessation Counseling: yes     Packs/Day: 1 ppd     Year Started: AT THE AGE OF [age]  Caffeine-Diet-Exercise     Does Patient Exercise: no  Current Medications (verified): 1)  Alprazolam 1 Mg  Tabs (Alprazolam) .... Take 1 Tablet By Mouth Three Times A Day As Needed For Anxiety 2)  Nexium 40 Mg  Cpdr (Esomeprazole Magnesium) .... Take 1 Tablet By Mouth Once A Day 3)  Combivent 103-18 Mcg/act Aero (Ipratropium-Albuterol) .... 2 Puffs Three Times A Day 4)  Oxycodone-Acetaminophen 5-325 Mg Tabs (Oxycodone-Acetaminophen) .... Take 1-2 Tabs By Mouth Q6 Hours As Needed For Breakthrough Pain 5)  Polyethylene Glycol 3350  Powd (Polyethylene Glycol 3350) .... Use 17 Grams in 10 Oz of Water Once or Twice Daily For Constipation  Titrate As Needed 6)  Duragesic-75 75 Mcg/hr Pt72 (Fentanyl) .... Apply One Patch As Directed  Every 48 Hours (New Instructions) 7)  Tens Home Unit  (Nerve Stimulator) .... Use As Directed To Affected Areas Two Times A Day As Needed For Muscle Spasm 8)  Megace Es 625 Mg/52ml Susp (Megestrol Acetate) .... 625mg (5ml) By Mouth Daily As Directed 9)  Advair Diskus 100-50 Mcg/dose Misc (Fluticasone-Salmeterol) .Marland Kitchen.. 1 Puff 2 Times Daily 10)  Spiriva Handihaler 18 Mcg  Caps (Tiotropium Bromide Monohydrate) .... Contents of One Capsule Inhaled Daily 11)  Proair Hfa 108 (90 Base) Mcg/act Aers (Albuterol Sulfate) .... Take 1-2 Puffs Every 4 Hours As Needed For Shortness of Breath 12)  Azithromycin 250 Mg  Tabs (Azithromycin) .... 2 By  Mouth Today and Then 1 Daily For 4 Days 13)  Zyrtec Allergy 10 Mg Tabs (Cetirizine Hcl) .... Take 1 Tablet By Mouth Once A Day  Allergies: 1)  ! * Ambien 2)  ! Lyrica 3)  ! Prednisone  Past History:  Past Medical History: Last updated:  01/02/2009 Anxiety Depression Headache Cervical Cancer Hx- treated by Dr. 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  The patient denies chest pain, syncope, dyspnea on exertion, peripheral edema, headaches, hemoptysis, abdominal pain, melena, hematochezia, hematuria, incontinence, and abnormal bleeding.    Physical Exam  General:  alert and underweight appearing.   Head:  normocephalic and atraumatic.   Eyes:  vision grossly intact, pupils equal, pupils round, and pupils reactive to light.   Nose:  congested Mouth:  pharynx pink and moist, coated tongue.  Neck:  supple.   Lungs:  normal respiratory effort and normal breath sounds.   Heart:  normal rate and regular rhythm.   Abdomen:  soft and non-tender.   Pulses:  normal peripheral pulses  Neurologic:  non focal Psych:  normally interactive, mildly anxious, denies suicidal ideation   Impression & Recommendations:  Problem # 1:  UPPER RESPIRATORY INFECTION (ICD-465.9) Her symptomatology is consistent with uri, advised her to continue with the abx prescribed earlier. I will give her zyrtec to help with her nasal secretions. We  also helped her with the inhaler techniques.   Her updated medication list for this problem includes:    Zyrtec Allergy 10 Mg Tabs (Cetirizine hcl) .Marland Kitchen... Take 1 tablet by mouth once a day  Problem # 2:  DEPRESSION (ICD-311) No suicidal ideation. She is going through bereavement. Will review her on next visit.  Her updated medication list for this problem includes:    Alprazolam 1 Mg Tabs (Alprazolam) .Marland Kitchen... Take 1 tablet by mouth three times a day as needed for anxiety  Problem # 3:  WEIGHT LOSS-ABNORMAL (ICD-783.21) I reviewed notes from Dr. Phillips Odor and Dr. Meredith Pel. Her weight is further down  ~10lb since last visit  last month. Certainly her recent bereavement could have a role. She will be reviewed in this regard on follow up visit after this acute illness and bereavement have settled.   Complete Medication List: 1)  Alprazolam 1 Mg Tabs (Alprazolam) .... Take 1 tablet by mouth three times a day as needed for anxiety 2)  Nexium 40 Mg Cpdr (Esomeprazole magnesium) .... Take 1 tablet by mouth once a day 3)  Combivent 103-18 Mcg/act Aero (Ipratropium-albuterol) .... 2 puffs three times a day 4)  Oxycodone-acetaminophen 5-325 Mg Tabs (Oxycodone-acetaminophen) .... Take 1-2 tabs by mouth q6 hours as needed for breakthrough pain 5)  Polyethylene Glycol 3350 Powd (Polyethylene glycol 3350) .... Use 17 grams in 10 oz of water once or twice daily for constipation  titrate as needed 6)  Duragesic-75 75 Mcg/hr Pt72 (Fentanyl) .... Apply one patch as directed every 48 hours (new instructions) 7)  Tens Home Unit (nerve Stimulator)  .... Use as directed to affected areas two times a day as needed for muscle spasm 8)  Megace Es 625 Mg/76ml Susp (Megestrol acetate) .... 625mg (5ml) by mouth daily as directed 9)  Advair Diskus 100-50 Mcg/dose Misc (Fluticasone-salmeterol) .Marland Kitchen.. 1 puff 2 times daily 10)  Spiriva Handihaler 18 Mcg Caps (Tiotropium bromide monohydrate) .... Contents of one capsule inhaled daily 11)   Proair Hfa 108 (90 Base) Mcg/act Aers (Albuterol sulfate) .... Take 1-2 puffs every 4 hours as needed for shortness of breath 12)  Azithromycin 250 Mg Tabs (Azithromycin) .... 2 by  mouth today and then 1 daily for 4 days 13)  Zyrtec Allergy 10 Mg Tabs (Cetirizine hcl) .... Take 1 tablet by mouth once a day  Patient Instructions: 1)  Please keep the follow up appointment as previously arranged. 2)  Do let us know if your problem worsens.   Prescriptions: ZYRTEC ALLERGY 10 MG TABS (CETIRIZINE HCL) Take 1 tablet by mouth once a day  #7 x 1   Entered and Authorized by:   Zara Council MD   Signed by:   Zara Council MD on 09/30/2009   Method used:   Electronically to        [facility] [address] [phone]* (retail)       [address]       [place], [place]  [address]       Ph: [phone] or [phone]       Fax: [phone]   RxID:   [id number]    Prevention & Chronic Care Immunizations   Influenza vaccine: Fluvax MCR  (04/30/2008)   Influenza vaccine deferral: Refused  (09/10/2009)    Tetanus booster: Not documented   Td booster deferral: Deferred  (09/10/2009)    Pneumococcal vaccine: Not documented  Colorectal Screening   Hemoccult: Not documented    Colonoscopy: Not documented  Other Screening   Pap smear: Not documented    Mammogram: ASSESSMENT: Negative - BI-RADS 1^MM DIGITAL SCREENING  (01/07/2009)   Mammogram due: 11/2008   Smoking status: current  (09/30/2009)   Smoking cessation counseling: yes  (09/30/2009)  Lipids   Total Cholesterol: 186  (10/27/2008)   LDL: 117  (10/27/2008)   LDL Direct: Not documented   HDL: 43  (10/27/2008)   Triglycerides: 132  (10/27/2008)

## 2010-08-31 NOTE — Assessment & Plan Note (Signed)
Summary: EST-1 MONTH RECHECK OB PER Sakai Wolford/[facility]   Vital Signs:  Patient profile:   50 year old female Height:      67 inches (170.18 cm) Weight:      122.06 pounds (55.48 kg) BMI:     19.19 Temp:     98.7 degrees F (37.06 degrees C) oral Pulse rate:   84 / minute BP sitting:   109 / 74  (left arm)  Vitals Entered By: Angelina Ok RN (November 26, 2009 9:50 AM) Is Patient Diabetic? No Pain Assessment Patient in pain? no      Nutritional Status BMI of < 19 = underweight  Have you ever been in a relationship where you felt threatened, hurt or afraid?No   Does patient need assistance? Functional Status Self care Ambulation Normal Comments Check up.  Forms   Primary Care Provider:  Julaine Fusi  DO   History of Present Illness: Aden come in today for routine follow-up and chronic pain management. Her pain levels are very well controlled on current dose of fentanyl and Hydrocodone for break through pain. Her depression as improved since teh winter and she is able to get outside more. She still complains of worsening SOB with exhertion but obverall her tolerance for physical actitivty is imporved. She enjoys spending time with her grandaughter.   Depression History:      The patient denies a depressed mood most of the day and a diminished interest in her usual daily activities.        The patient denies that she feels like life is not worth living, denies that she wishes that she were dead, and denies that she has thought about ending her life.         Preventive Screening-Counseling & Management  Alcohol-Tobacco     Smoking Status: current     Smoking Cessation Counseling: yes     Packs/Day: 1 ppd     Year Started: AT THE AGE OF [age]  Current Medications (verified): 1)  Alprazolam 1 Mg  Tabs (Alprazolam) .... Take 1 Tablet By Mouth Three Times A Day As Needed For Anxiety 2)  Nexium 40 Mg  Cpdr (Esomeprazole Magnesium) .... Take 1 Tablet By Mouth Once A Day 3)  Norco  10-325 Mg Tabs (Hydrocodone-Acetaminophen) .... Take 1 Tablet By Mouth Every 6 Hours 4)  Polyethylene Glycol 3350  Powd (Polyethylene Glycol 3350) .... Use 17 Grams in 10 Oz of Water Once or Twice Daily For Constipation  Titrate As Needed 5)  Duragesic-75 75 Mcg/hr Pt72 (Fentanyl) .... Apply One Patch As Directed Every 48 Hours (New Instructions) 6)  Megace Es 625 Mg/66ml Susp (Megestrol Acetate) .... 625mg (5ml) By Mouth Daily As Directed 7)  Advair Diskus 100-50 Mcg/dose Misc (Fluticasone-Salmeterol) .Marland Kitchen.. 1 Puff 2 Times Daily 8)  Spiriva Handihaler 18 Mcg  Caps (Tiotropium Bromide Monohydrate) .... Contents of One Capsule Inhaled Daily 9)  Proair Hfa 108 (90 Base) Mcg/act Aers (Albuterol Sulfate) .... Take 1-2 Puffs Every 4 Hours As Needed For Shortness of Breath 10)  Zyrtec Allergy 10 Mg Tabs (Cetirizine Hcl) .... Take 1 Tablet By Mouth Once A Day 11)  Promethazine Hcl 25 Mg Tabs (Promethazine Hcl) .... Take 1 Tablet By Mouth Four Times A Day As Needed 12)  Nicoderm Cq 21 Mg/24hr Pt24 (Nicotine) .... One Patch A Day. Do Not Smoke While On This Patch.  Allergies (verified): 1)  ! * Ambien 2)  ! Lyrica 3)  ! Prednisone  Review of Systems  See HPI  Physical Exam  General:  alert and underweight appearing, not in distress.  Neck:  supple.   Lungs:  normal respiratory effort and normal breath sounds-scattered wheezes-late.   Heart:  bordelrine tachy, no murmur.   Abdomen:  soft and non-tender.   Msk:  Multiple trigger points in trap/C-Spine upper back. SI joint tenderness.  Extremities:  no edema Skin:  scalp rash has improved. Psych:  Oriented X3, memory intact for recent and remote, normally interactive, not anxious appearing, and not suicidal.     Impression & Recommendations:  Problem # 1:  COPD (ICD-496) Will luikely need repeat PFTs to document diosease progression- discussed using inhalers daily despite symptoms.  Her updated medication list for this problem includes:     Advair Diskus 100-50 Mcg/dose Misc (Fluticasone-salmeterol) .Marland Kitchen... 1 puff 2 times daily    Spiriva Handihaler 18 Mcg Caps (Tiotropium bromide monohydrate) .Marland Kitchen... Contents of one capsule inhaled daily    Proair Hfa 108 (90 Base) Mcg/act Aers (Albuterol sulfate) .Marland Kitchen... Take 1-2 puffs every 4 hours as needed for shortness of breath  Problem # 2:  CHRONIC PAIN SYNDROME (ICD-338.4) Will continue wcurrent pain regimen. Highly functional and controlled with current dosing and delivery.  Problem # 3:  GERD (ICD-530.81) Continue PPI. Her updated medication list for this problem includes:    Nexium 40 Mg Cpdr (Esomeprazole magnesium) .Marland Kitchen... Take 1 tablet by mouth once a day  Problem # 4:  WEIGHT LOSS-ABNORMAL (ICD-783.21) Improved -weight gain better.  Complete Medication List: 1)  Alprazolam 1 Mg Tabs (Alprazolam) .... Take 1 tablet by mouth three times a day as needed for anxiety 2)  Nexium 40 Mg Cpdr (Esomeprazole magnesium) .... Take 1 tablet by mouth once a day 3)  Norco 10-325 Mg Tabs (Hydrocodone-acetaminophen) .... Take 1 tablet by mouth every 6 hours 4)  Polyethylene Glycol 3350 Powd (Polyethylene glycol 3350) .... Use 17 grams in 10 oz of water once or twice daily for constipation  titrate as needed 5)  Duragesic-75 75 Mcg/hr Pt72 (Fentanyl) .... Apply one patch as directed every 48 hours (new instructions) 6)  Megace Es 625 Mg/40ml Susp (Megestrol acetate) .... 625mg (5ml) by mouth daily as directed 7)  Advair Diskus 100-50 Mcg/dose Misc (Fluticasone-salmeterol) .Marland Kitchen.. 1 puff 2 times daily 8)  Spiriva Handihaler 18 Mcg Caps (Tiotropium bromide monohydrate) .... Contents of one capsule inhaled daily 9)  Proair Hfa 108 (90 Base) Mcg/act Aers (Albuterol sulfate) .... Take 1-2 puffs every 4 hours as needed for shortness of breath 10)  Zyrtec Allergy 10 Mg Tabs (Cetirizine hcl) .... Take 1 tablet by mouth once a day 11)  Promethazine Hcl 25 Mg Tabs (Promethazine hcl) .... Take 1 tablet by mouth four  times a day as needed 12)  Nicoderm Cq 21 Mg/24hr Pt24 (Nicotine) .... One patch a day. do not smoke while on this patch.  Patient Instructions: 1)  Please schedule a follow-up appointment in 3 months. Prescriptions: NORCO 10-325 MG TABS (HYDROCODONE-ACETAMINOPHEN) Take 1 tablet by mouth every 6 hours  #90 x 0   Entered and Authorized by:   Julaine Fusi  DO   Signed by:   Julaine Fusi  DO on 11/26/2009   Method used:   Print then Give to Patient   RxID:   [id number] ALPRAZOLAM 1 MG  TABS (ALPRAZOLAM) Take 1 tablet by mouth three times a day as needed for anxiety  #90 x 3   Entered and Authorized by:   Julaine Fusi  DO  Signed by:   Julaine Fusi  DO on 11/26/2009   Method used:   Print then Give to Patient   RxID:   [id number] DURAGESIC-75 75 MCG/HR PT72 (FENTANYL) Apply one patch as directed every 48 hours (new instructions)  #16 x 0   Entered and Authorized by:   Julaine Fusi  DO   Signed by:   Julaine Fusi  DO on 11/26/2009   Method used:   Print then Give to Patient   RxID:   [id number]   Prevention & Chronic Care Immunizations   Influenza vaccine: Fluvax MCR  (04/30/2008)   Influenza vaccine deferral: Refused  (09/10/2009)    Tetanus booster: Not documented   Td booster deferral: Deferred  (09/10/2009)    Pneumococcal vaccine: Not documented  Colorectal Screening   Hemoccult: Not documented    Colonoscopy: Not documented  Other Screening   Pap smear: Not documented    Mammogram: ASSESSMENT: Negative - BI-RADS 1^MM DIGITAL SCREENING  (01/07/2009)   Mammogram due: 11/2008   Smoking status: current  (11/26/2009)   Smoking cessation counseling: yes  (11/26/2009)  Lipids   Total Cholesterol: 186  (10/27/2008)   LDL: 117  (10/27/2008)   LDL Direct: Not documented   HDL: 43  (10/27/2008)   Triglycerides: 132  (10/27/2008)    Vital Signs:  Patient profile:   50 year old female Height:      67 inches (170.18 cm) Weight:      122.06 pounds  (55.48 kg) BMI:     19.19 Temp:     98.7 degrees F (37.06 degrees C) oral Pulse rate:   84 / minute BP sitting:   109 / 74  (left arm)  Vitals Entered By: Angelina Ok RN (November 26, 2009 9:50 AM)

## 2010-08-31 NOTE — Medication Information (Signed)
Summary: Tax adviser   Imported By: Florinda Marker 09/07/2009 15:21:09  _____________________________________________________________________  External Attachment:    Type:   Image     Comment:   External Document

## 2010-08-31 NOTE — Progress Notes (Signed)
Summary: refill/ hla  Phone Note Refill Request Message from:  Patient on August 03, 2009 4:16 PM  Refills Requested: Medication #1:  valtrex Initial call taken by: Marin Roberts RN,  August 03, 2009 4:16 PM  Follow-up for Phone Call        Refill approved-nurse to complete Follow-up by: Julaine Fusi  DO,  August 06, 2009 3:12 PM    Prescriptions: VALTREX 1 GM TABS (VALACYCLOVIR HCL) Take 1 tablet by mouth once a day  #5 x 2   Entered and Authorized by:   Julaine Fusi  DO   Signed by:   Julaine Fusi  DO on 08/06/2009   Method used:   [facility]ron[facility] to        [facility] [address] [phone]* (retail)       [address]       [place], [place]  [address]       Ph: [phone] or [phone]       Fax: [phone]   RxID:   [id number]

## 2010-08-31 NOTE — Assessment & Plan Note (Signed)
Summary: Cardiology Nuclear Testing  Nuclear Med Background Indications for Stress Test: Evaluation for Ischemia, Post Hospital  Indications Comments: 04/29/10 [facility]: CP,SOB (-) enzymes.  History: COPD, Echo  History Comments: 04/30/10 Echo: EF=60-65%.  Symptoms: Chest Pain, DOE, Palpitations, SOB    Nuclear Pre-Procedure Cardiac Risk Factors: Family History - CAD, Smoker Caffeine/Decaff Intake: none NPO After: 8:00 PM Lungs: clear IV 0.9% NS with Angio Cath: 22g     IV Site: R Hand IV Started by: Cathlyn Parsons, RN Chest Size (in) 32     Cup Size A     Height (in): 67 Weight (lb): 122 BMI: 19.18 Tech Comments: This patient started on the treadmill and was unable to keep up. Lexiscan was given while walking.  Nuclear Med Study 1 or 2 day study:  1 day     Stress Test Type:  Treadmill/Lexiscan Reading MD:  Olga Millers, MD     Referring MD:  E.Golding Resting Radionuclide:  Technetium 84m Tetrofosmin     Resting Radionuclide Dose:  11 mCi  Stress Radionuclide:  Technetium 71m Tetrofosmin     Stress Radionuclide Dose:  33 mCi   Stress Protocol Exercise Time (min):  3:15 min     Max HR:  144 bpm     Predicted Max HR:  169 bpm  Max Systolic BP: 150 mm Hg     Percent Max HR:  85.21 %     METS: 4.40 Rate Pressure Product:  11914  Lexiscan: 0.4 mg   Stress Test Technologist:  Milana Na, EMT-P     Nuclear Technologist:  Domenic Polite, CNMT  Rest Procedure  Myocardial perfusion imaging was performed at rest 45 minutes following the intravenous administration of Technetium 40m Tetrofosmin.  Stress Procedure  The patient received IV Lexiscan 0.4 mg over 15-seconds with concurrent low level exercise and then Technetium 25m Tetrofosmin was injected at 30-seconds while the patient continued walking one more minute.  There were non specific changes with Lexiscan.  Quantitative spect images were obtained after a 45 minute delay.  QPS Raw Data Images:  Acquisition  technically good; normal left ventricular size. Stress Images:  Normal homogeneous uptake in all areas of the myocardium. Rest Images:  Normal homogeneous uptake in all areas of the myocardium. Subtraction (SDS):  No evidence of ischemia. Transient Ischemic Dilatation:  1.04  (Normal <1.22)  Lung/Heart Ratio:  .32  (Normal <0.45)  Quantitative Gated Spect Images QGS EDV:  61 ml QGS ESV:  18 ml QGS EF:  71 % QGS cine images:  Normal wall motion.   Overall Impression  Exercise Capacity: Lexiscan with no exercise. BP Response: Normal blood pressure response. Clinical Symptoms: No chest pain ECG Impression: No significant ST segment change suggestive of ischemia. Overall Impression: Normal stress nuclear study with no ischemia or infarction.

## 2010-08-31 NOTE — Progress Notes (Signed)
Summary: BP questions/ hla  Phone Note Call from Patient   Summary of Call: rec'd message from [facility] that pt had called and left voicemail for her about her bp, i called pt, she was concerned about normal bp and hr, she stated the hhn had taken them and said they" were a little low" ask her about symptoms, denied except for some weakness but that it was better than last week, encouraged her to keep her appt, to rest, not move suddenly and to call if she felt her condition had changed for the worse, she is agreeable Initial call taken by: Marin Roberts RN,  October 14, 2009 3:57 PM  Follow-up for Phone Call        Evaluated patient. Follow-up by: Julaine Fusi  DO,  October 29, 2009 1:30 PM

## 2010-08-31 NOTE — Miscellaneous (Signed)
[facility]: [facility]   Imported By: Shon Hough 05/03/2010 17:04:20  _____________________________________________________________________  External Attachment:    Type:   Image     Comment:   External Document

## 2010-08-31 NOTE — Miscellaneous (Signed)
Clinical Lists Changes Spoke with patient re: Spirometry results. WIll start her meds for severe Chronic Obstructive Lung Disease. She also c/o URI. WIll treat. Told her to go to ED if symptoms got worse-cough and SOB.  Problems: Added new problem of UPPER RESPIRATORY INFECTION (ICD-465.9) Assessed COPD as comment only - Advanced disease. FEV1 09/2009 is 36% indicating severe obstruction. Will start on and Spiriva with rescue inhaler.   Her updated medication list for this problem includes:    Combivent 103-18 Mcg/act Aero (Ipratropium-albuterol) .Marland Kitchen... 2 puffs three times a day    Advair Diskus 100-50 Mcg/dose Misc (Fluticasone-salmeterol) .Marland Kitchen... 1 puff 2 times daily    Spiriva Handihaler 18 Mcg Caps (Tiotropium bromide monohydrate) .Marland Kitchen... Contents of one capsule inhaled daily    Proair Hfa 108 (90 Base) Mcg/act Aers (Albuterol sulfate) .Marland Kitchen... Take 1-2 puffs every 4 hours as needed for shortness of breath  Assessed UPPER RESPIRATORY INFECTION as comment only - Spoke with patient today who has had worsening respiratory symptoms for 4- days. Concern for Chronic Obstructive Lung Disease exac with URI will call in abx and inhalers. She is refusing steroids. She has an appt tomorrow in clinic. She will need to be assessed for wheezing to determine if steroids are needed. Medications: Added new medication of ADVAIR DISKUS 100-50 MCG/DOSE MISC (FLUTICASONE-SALMETEROL) 1 puff 2 times daily - Signed Added new medication of SPIRIVA HANDIHALER 18 MCG  CAPS (TIOTROPIUM BROMIDE MONOHYDRATE) contents of one capsule inhaled daily - Signed Added new medication of PROAIR HFA 108 (90 BASE) MCG/ACT AERS (ALBUTEROL SULFATE) Take 1-2 puffs every 4 hours as needed for shortness of breath - Signed Added new medication of AZITHROMYCIN 250 MG  TABS (AZITHROMYCIN) 2 by  mouth today and then 1 daily for 4 days - Signed Rx of ADVAIR DISKUS 100-50 MCG/DOSE MISC (FLUTICASONE-SALMETEROL) 1 puff 2 times daily;  #1 x 3;  Signed;   Entered by: Julaine Fusi  DO;  Authorized by: Julaine Fusi  DO;  Method used: [facility] [address] [phone]*, [address] [place], [place], [place]  [address], Ph: [phone] or [phone], Fax: [phone] Rx of SPIRIVA HANDIHALER 18 MCG  CAPS (TIOTROPIUM BROMIDE MONOHYDRATE) contents of one capsule inhaled daily;  #1 x 3;  Signed;  Entered by: Julaine Fusi  DO;  Authorized by: Julaine Fusi  DO;  Method used: [facility] to [facility] [address] [phone]*, [address] [place], [place], [place]  [address], Ph: [phone] or [phone], Fax: [phone] Rx of PROAIR HFA 108 (90 BASE) MCG/ACT AERS (ALBUTEROL SULFATE) Take 1-2 puffs every 4 hours as needed for shortness of breath;  #1 x 11;  Signed;  Entered by: Julaine Fusi  DO;  Authorized by: Julaine Fusi  DO;  Method used: Electronically to [facility] [address] [phone]*, [address] [place], [place], [place]  [address], Ph: [phone] or [phone], Fax: [phone] Rx of AZITHROMYCIN 250 MG  TABS (AZITHROMYCIN) 2 by  mouth today and then 1 daily for 4 days;  #6 x 0;  Signed;  Entered by: Julaine Fusi  DO;  Authorized by: Julaine Fusi  DO;  Method used: Electronically to [facility] [address] [phone]*, [address], [place], [place], [place]  [address], Ph: [phone] or [phone], Fax: [phone]    Prescriptions: AZITHROMYCIN 250 MG  TABS (AZITHROMYCIN) 2 by  mouth today and then 1 daily for 4 days  #6 x 0   Entered and Authorized by:   Julaine Fusi  DO   Signed by:   Julaine Fusi  DO on 09/29/2009   Method used:   Electronically to        [facility] [address] [phone]* (retail)       [address]       [place], [place]  [address]       Ph: [phone] or [phone]       Fax: [phone]   RxID:   [id number] PROAIR HFA 108 (90 BASE) MCG/ACT AERS (ALBUTEROL SULFATE) Take 1-2 puffs every 4 hours as needed for shortness of breath  #1 x 11   Entered  and Authorized by:   Julaine Fusi  DO   Signed by:   Julaine Fusi  DO on 09/29/2009   Method used:   Electronically to        [facility] [address] [phone]* (retail)       [address]       [place], [place]  [address]       Ph: [phone] or [phone]       Fax: [phone]   RxID:   [id number] SPIRIVA HANDIHALER 18 MCG  CAPS (TIOTROPIUM BROMIDE MONOHYDRATE) contents of one capsule inhaled daily  #1 x 3   Entered and Authorized by:   Julaine Fusi  DO   Signed by:   Julaine Fusi  DO on 09/29/2009   Method used:   Electronically to        [facility] [address] [phone]* (retail)       [address]       [place], [place]  [address]       Ph: [phone] or [phone]       Fax: [phone]   RxID:   [id number] ADVAIR DISKUS 100-50 MCG/DOSE MISC (FLUTICASONE-SALMETEROL) 1 puff 2 times daily  #1 x 3   Entered and Authorized by:   Julaine Fusi  DO   Signed by:   Julaine Fusi  DO on 09/29/2009   Method used:   Electronically to        [facility] [address] [phone]* (retail)       [address]       [place], [place]  [address]       Ph: [phone] or [phone]       Fax: [phone]   RxID:   [id number]    Assessment & Plan:  Status of Existing Problems: 1)  Assessed Copd As Comment Only - Julaine Fusi DO  2)  Assessed Upper Respiratory Infection As Comment Only - Julaine Fusi DO   Medical Problems Added: 1)  Dx of Upper Respiratory Infection  (ICD-465.9)  Updated Medical Problems: 1)  Dx of Upper Respiratory Infection  (ICD-465.9) 2)  Dx of Psoriasis, Scalp  (ICD-696.1) 3)  Dx of Cough  (ICD-786.2) 4)  Dx of Weight Loss-abnormal  (ICD-783.21) 5)  Dx of Dysphagia  (ICD-787.29) 6)  Dx of Gerd  (ICD-530.81) 7)  Dx of Dysphagia, Oropharyngeal Phase  (NLZ-767.34) 8)  Dx of Other Abnormal Finding Radiological Exam Breast  (ICD-793.89) 9)  Dx of Swelling of Limb  (ICD-729.81) 10)  Dx of  Pain Management Contract  (10/27/2008) 11)  Dx of Chronic Pain Syndrome  (ICD-338.4) 12)  Dx of Neck Pain  (ICD-723.1) 13)  Dx of Health Maintenance  Exam  (ICD-V70.0) 14)  Dx of Fibromyalgia  (ICD-729.1) 15)  Dx of B12 Deficiency  (ICD-266.2) 16)  Dx of Cervical Cancer, Hx of  (ICD-V10.41) 17)  Dx of Colonic Polyps, Hx of  (ICD-V12.72) 18)  Dx of Anemia-iron Deficiency  (ICD-280.9) 19)  Dx of Chest Pain, Atypical, Hx of  (ICD-V15.89) 20)  Dx of COPD  (ICD-496) 21)  Dx of Fatigue, Acute  (ICD-780.79) 22)  Dx of Tobacco Abuse  (ICD-305.1) 23)  Dx of Headache  (ICD-784.0) 24)  Dx of Depression  (ICD-311) 25)  Dx of Anxiety  (ICD-300.00)  New Prescriptions/Refills: 1)  Advair Diskus 100-50 Mcg/dose Misc (Fluticasone-salmeterol) .Marland Kitchen.. 1 puff 2 times daily 2)  Spiriva Handihaler 18 Mcg Caps (Tiotropium bromide monohydrate) .... Contents of one capsule inhaled daily 3)  Proair Hfa 108 (90 Base) Mcg/act Aers (Albuterol sulfate) .... Take 1-2 puffs every 4 hours as needed for shortness of breath 4)  Azithromycin 250 Mg Tabs (Azithromycin) .... 2 by  mouth today and then 1 daily for 4 days  Current Medication List: 1)  Alprazolam 1 Mg Tabs (Alprazolam) .... Take 1 tablet by mouth three times a day as needed for anxiety 2)  Nexium 40 Mg Cpdr (Esomeprazole magnesium) .... Take 1 tablet by mouth once a day 3)  Combivent 103-18 Mcg/act Aero (Ipratropium-albuterol) .... 2 puffs three times a day 4)  Oxycodone-acetaminophen 5-325 Mg Tabs (Oxycodone-acetaminophen) .... Take 1-2 tabs by mouth q6 hours as needed for breakthrough pain 5)  Polyethylene Glycol 3350 Powd (Polyethylene glycol 3350) .... Use 17 grams in 10 oz of water once or twice daily for constipation  titrate as needed 6)  Duragesic-75 75 Mcg/hr Pt72 (Fentanyl) .... Apply one patch as directed every 48 hours (new instructions) 7)  Tens Home Unit (nerve Stimulator)  .... Use as directed to affected areas two times a day as needed for muscle  spasm 8)  Megace Es 625 Mg/11ml Susp (Megestrol acetate) .... 625mg (5ml) by mouth daily as directed 9)  Advair Diskus 100-50 Mcg/dose Misc (Fluticasone-salmeterol) .Marland Kitchen.. 1 puff 2 times daily 10)  Spiriva Handihaler 18 Mcg Caps (Tiotropium bromide monohydrate) .... Contents of one capsule inhaled daily 11)  Proair Hfa 108 (90 Base) Mcg/act Aers (Albuterol sulfate) .... Take 1-2 puffs every 4 hours as needed for shortness of breath 12)  Azithromycin 250 Mg Tabs (Azithromycin) .... 2 by  mouth today and then 1 daily for 4 days  Impression & Recommendations:  Problem # 1:  COPD (ICD-496) Advanced disease. FEV1 09/2009 is 36% indicating severe obstruction. Will start on and Spiriva with rescue inhaler.   Her updated medication list for this problem includes:    Combivent 103-18 Mcg/act Aero (Ipratropium-albuterol) .Marland Kitchen... 2 puffs three times a day    Advair Diskus 100-50 Mcg/dose Misc (Fluticasone-salmeterol) .Marland Kitchen... 1 puff 2 times daily    Spiriva Handihaler 18 Mcg Caps (Tiotropium bromide monohydrate) .Marland Kitchen... Contents of one capsule inhaled daily    Proair Hfa 108 (90 Base) Mcg/act Aers (Albuterol sulfate) .Marland Kitchen... Take 1-2 puffs every 4 hours as needed for shortness of breath  Problem # 2:  UPPER RESPIRATORY INFECTION (ICD-465.9) Spoke with patient today who has had worsening respiratory symptoms for 4- days. Concern for Chronic Obstructive Lung Disease exac with URI will call in abx and inhalers. She is refusing steroids. She has an appt tomorrow in clinic. She will need to be assessed for wheezing to determine if steroids are needed.  Complete Medication List: 1)  Alprazolam 1 Mg Tabs (  Alprazolam) .... Take 1 tablet by mouth three times a day as needed for anxiety 2)  Nexium 40 Mg Cpdr (Esomeprazole magnesium) .... Take 1 tablet by mouth once a day 3)  Combivent 103-18 Mcg/act Aero (Ipratropium-albuterol) .... 2 puffs three times a day 4)  Oxycodone-acetaminophen 5-325 Mg Tabs (Oxycodone-acetaminophen)  .... Take 1-2 tabs by mouth q6 hours as needed for breakthrough pain 5)  Polyethylene Glycol 3350 Powd (Polyethylene glycol 3350) .... Use 17 grams in 10 oz of water once or twice daily for constipation  titrate as needed 6)  Duragesic-75 75 Mcg/hr Pt72 (Fentanyl) .... Apply one patch as directed every 48 hours (new instructions) 7)  Tens Home Unit (nerve Stimulator)  .... Use as directed to affected areas two times a day as needed for muscle spasm 8)  Megace Es 625 Mg/80ml Susp (Megestrol acetate) .... 625mg (5ml) by mouth daily as directed 9)  Advair Diskus 100-50 Mcg/dose Misc (Fluticasone-salmeterol) .Marland Kitchen.. 1 puff 2 times daily 10)  Spiriva Handihaler 18 Mcg Caps (Tiotropium bromide monohydrate) .... Contents of one capsule inhaled daily 11)  Proair Hfa 108 (90 Base) Mcg/act Aers (Albuterol sulfate) .... Take 1-2 puffs every 4 hours as needed for shortness of breath 12)  Azithromycin 250 Mg Tabs (Azithromycin) .... 2 by  mouth today and then 1 daily for 4 days

## 2010-08-31 NOTE — Assessment & Plan Note (Signed)
Summary: 81month f/u/est/vs   Vital Signs:  Patient profile:   50 year old female Height:      67 inches (170.18 cm) Weight:      115.01 pounds (52.28 kg) BMI:     18.08 O2 Sat:      99 % Temp:     97.3 degrees F (36.28 degrees C) oral Pulse rate:   107 / minute BP sitting:   130 / 76  (right arm)  Vitals Entered By: Angelina Ok RN (October 28, 2009 2:17 PM) CC: Depression Is Patient Diabetic? No Pain Assessment Patient in pain? yes     Location: back Intensity: 8 Type: aching Onset of pain  Constant Nutritional Status BMI of < 19 = underweight  Have you ever been in a relationship where you felt threatened, hurt or afraid?No   Does patient need assistance? Functional Status Self care Ambulation Normal Comments 3-4 weeks ago went for Chect congestion.  Told them about the [facility] Patch.  Something like a seizure happened.  Said she was unresponsive.  Went to ICU.  2-3 days did not know what was going on.  Father had seizures.  Worried it will happen again.  Has not been right in the past 2-3 weeks.   Primary Care Provider:  Julaine Fusi  DO  CC:  Depression.  History of Present Illness: [name] comes in for routine follow-up on her depression, weight-loss, and chronic pain management. She stopped taking the Cymbalta several weeks ago because she had suicidal ideation and "thought about death more than usual". She has been acutely worse in terms of her depression since her Dog died several months ago and tells me that "she just cant shake it". She is feeling better in terms of her severe UTI/COPD exacerbation that landed her in the hospitral several weeks ago. He pain is unchanged and steady at about a 3-4/10 which is pretty good for her- she reports she is funtional this level. Still coomplains of severe fatigue.  Depression History:      The patient is having a depressed mood most of the day but denies diminished interest in her usual daily activities.  Positive alarm  features for depression include significant weight loss, insomnia, psychomotor agitation, fatigue (loss of energy), feelings of worthlessness (guilt), impaired concentration (indecisiveness), and recurrent thoughts of death or suicide.        Psychosocial stress factors include a recent traumatic event and major life changes.  Risk factors for depression include a family history of depression, a family history of alcoholism, a personal history of depression, a recent loss, and chronic illness.  Suicide risk questions reveal that she wishes that she were dead.  The patient denies that she feels like life is not worth living and denies that she has thought about ending her life.        Comments:  Cymbalta made it worse.  Has stopped taking it.  Crying off and on.  Last couple of weeks has been rough. No energy.  Unable to go to [place].  Thinks it is having idle time.  .   Preventive Screening-Counseling & Management  Alcohol-Tobacco     Smoking Status: current     Smoking Cessation Counseling: yes     Packs/Day: 1 ppd     Year Started: AT THE AGE OF [age]  Comments: Tries to slow down on the smoking.  Current Medications (verified): 1)  Alprazolam 1 Mg  Tabs (Alprazolam) .... Take 1 Tablet By Mouth Three Times  A Day As Needed For Anxiety 2)  Nexium 40 Mg  Cpdr (Esomeprazole Magnesium) .... Take 1 Tablet By Mouth Once A Day 3)  Norco 10-325 Mg Tabs (Hydrocodone-Acetaminophen) .... Take 1 Tablet By Mouth Every 6 Hours 4)  Polyethylene Glycol 3350  Powd (Polyethylene Glycol 3350) .... Use 17 Grams in 10 Oz of Water Once or Twice Daily For Constipation  Titrate As Needed 5)  Duragesic-75 75 Mcg/hr Pt72 (Fentanyl) .... Apply One Patch As Directed Every 48 Hours (New Instructions) 6)  Megace Es 625 Mg/52ml Susp (Megestrol Acetate) .... 625mg (5ml) By Mouth Daily As Directed 7)  Advair Diskus 100-50 Mcg/dose Misc (Fluticasone-Salmeterol) .Marland Kitchen.. 1 Puff 2 Times Daily 8)  Spiriva Handihaler 18 Mcg  Caps  (Tiotropium Bromide Monohydrate) .... Contents of One Capsule Inhaled Daily 9)  Proair Hfa 108 (90 Base) Mcg/act Aers (Albuterol Sulfate) .... Take 1-2 Puffs Every 4 Hours As Needed For Shortness of Breath 10)  Zyrtec Allergy 10 Mg Tabs (Cetirizine Hcl) .... Take 1 Tablet By Mouth Once A Day 11)  Promethazine Hcl 25 Mg Tabs (Promethazine Hcl) .... Take 1 Tablet By Mouth Four Times A Day As Needed 12)  Nicoderm Cq 21 Mg/24hr Pt24 (Nicotine) .... One Patch A Day. Do Not Smoke While On This Patch.  Allergies (verified): 1)  ! * Ambien 2)  ! Lyrica 3)  ! Prednisone  Review of Systems      See HPI  Physical Exam  General:  alert and underweight appearing, not in distress.  Neck:  supple.   Lungs:  normal respiratory effort and normal breath sounds.   Heart:  bordelrine tachy, no murmur.   Abdomen:  soft and non-tender.   Msk:  Multiple tenderpoints in upper back and neck. Pain on flex/ext of neck.  Pulses:  R and L carotid,radial,femoral,dorsalis pedis and posterior tibial pulses are full and equal bilaterally Extremities:  No clubbing, cyanosis, edema, or deformity noted with normal full range of motion of all joints.   Neurologic:  alert & oriented X3, cranial nerves II-XII intact, strength normal in all extremities, and sensation intact to light touch.   Skin:  no rashes.   Psych:  depressed affect.  tearful. poor posture but makes good eye contact-apeas to have PSMR-mild.   Impression & Recommendations:  Problem # 1:  DEPRESSION (ICD-311) Cannot tolerate Cymbalta. Irritability. I have recmmended counseling, yoga and mediation-long discussion on non-medical treatment options. Essentially her depression is med resistant. History of past SI on meds. I have encouraged her to see a psychitrist but she ADAMANTLY refuses.  The following medications were removed from the medication list:    Cymbalta 30 Mg Cpep (Duloxetine hcl) .Marland Kitchen... Take one tablet once daily for one week than increase as  directed. Her updated medication list for this problem includes:    Alprazolam 1 Mg Tabs (Alprazolam) .Marland Kitchen... Take 1 tablet by mouth three times a day as needed for anxiety  Problem # 2:  CHRONIC PAIN SYNDROME (ICD-338.4) Multiple factors- Anaijah's physical pain is made much worse by her inability to treat and deal with her depression. She has come a long way since I first met her in terms of dealing with her pain. I have treated her with OMT on several occasion with good relief but switching her to long acting transdermal opiate has been a good choice for her. She does require q 48 hour dosing of Duragesic- at 72 hours she looses her pain control and then has to rely heavilty on breakthrough  medications.  Problem # 3:  WEIGHT LOSS-ABNORMAL (ICD-783.21) Imporved on Megace. I think its a combination of Severe Depression and Chronic Obstructive Lung Disease.  Complete Medication List: 1)  Alprazolam 1 Mg Tabs (Alprazolam) .... Take 1 tablet by mouth three times a day as needed for anxiety 2)  Nexium 40 Mg Cpdr (Esomeprazole magnesium) .... Take 1 tablet by mouth once a day 3)  Norco 10-325 Mg Tabs (Hydrocodone-acetaminophen) .... Take 1 tablet by mouth every 6 hours 4)  Polyethylene Glycol 3350 Powd (Polyethylene glycol 3350) .... Use 17 grams in 10 oz of water once or twice daily for constipation  titrate as needed 5)  Duragesic-75 75 Mcg/hr Pt72 (Fentanyl) .... Apply one patch as directed every 48 hours (new instructions) 6)  Megace Es 625 Mg/8ml Susp (Megestrol acetate) .... 625mg (5ml) by mouth daily as directed 7)  Advair Diskus 100-50 Mcg/dose Misc (Fluticasone-salmeterol) .Marland Kitchen.. 1 puff 2 times daily 8)  Spiriva Handihaler 18 Mcg Caps (Tiotropium bromide monohydrate) .... Contents of one capsule inhaled daily 9)  Proair Hfa 108 (90 Base) Mcg/act Aers (Albuterol sulfate) .... Take 1-2 puffs every 4 hours as needed for shortness of breath 10)  Zyrtec Allergy 10 Mg Tabs (Cetirizine hcl) .... Take 1  tablet by mouth once a day 11)  Promethazine Hcl 25 Mg Tabs (Promethazine hcl) .... Take 1 tablet by mouth four times a day as needed 12)  Nicoderm Cq 21 Mg/24hr Pt24 (Nicotine) .... One patch a day. do not smoke while on this patch.  Patient Instructions: 1)  Please schedule a follow-up appointment in 1 month ok to add on to Golding's schedule. Prescriptions: NORCO 10-325 MG TABS (HYDROCODONE-ACETAMINOPHEN) Take 1 tablet by mouth every 6 hours  #90 x 0   Entered and Authorized by:   Julaine Fusi  DO   Signed by:   Julaine Fusi  DO on 10/28/2009   Method used:   Print then Give to Patient   RxID:   [id number] DURAGESIC-75 75 MCG/HR PT72 (FENTANYL) Apply one patch as directed every 48 hours (new instructions)  #16 x 0   Entered and Authorized by:   Julaine Fusi  DO   Signed by:   Julaine Fusi  DO on 10/28/2009   Method used:   Print then Give to Patient   RxID:   [id number]   Prevention & Chronic Care Immunizations   Influenza vaccine: Fluvax MCR  (04/30/2008)   Influenza vaccine deferral: Refused  (09/10/2009)    Tetanus booster: Not documented   Td booster deferral: Deferred  (09/10/2009)    Pneumococcal vaccine: Not documented  Colorectal Screening   Hemoccult: Not documented    Colonoscopy: Not documented  Other Screening   Pap smear: Not documented    Mammogram: ASSESSMENT: Negative - BI-RADS 1^MM DIGITAL SCREENING  (01/07/2009)   Mammogram due: 11/2008   Smoking status: current  (10/28/2009)   Smoking cessation counseling: yes  (10/28/2009)  Lipids   Total Cholesterol: 186  (10/27/2008)   LDL: 117  (10/27/2008)   LDL Direct: Not documented   HDL: 43  (10/27/2008)   Triglycerides: 132  (10/27/2008)     Vital Signs:  Patient profile:   50 year old female Height:      67 inches (170.18 cm) Weight:      115.01 pounds (52.28 kg) BMI:     18.08 O2 Sat:      99 % Temp:     97.3 degrees F (36.28 degrees C) oral Pulse rate:   107 /  minute BP  sitting:   130 / 76  (right arm)  Vitals Entered By: Angelina Ok RN (October 28, 2009 2:17 PM)

## 2010-08-31 NOTE — Progress Notes (Signed)
Summary: Refill/gh  Phone Note Refill Request Message from:  Fax from Pharmacy on May 31, 2010 11:24 AM  Refills Requested: Medication #1:  HYDROCODONE-ACETAMINOPHEN 10-325 MG TABS take one tablet every 4 to 6 hours by mouth as needed pain   Last Refilled: 04/30/2010 Last office vivit was 05/19/2010.  Recent Hospitalization.   Method Requested: Electronic Initial call taken by: Angelina Ok RN,  May 31, 2010 11:24 AM  Follow-up for Phone Call        Rx denied because Dr. Phillips Odor just provided oxycodone-ACTMN 5/325 #90 on 05/19/10.  Follow-up by: Mariea Stable MD,  May 31, 2010 11:30 AM     Appended Document: [facility] per Dr. Onalee Hua.

## 2010-08-31 NOTE — Progress Notes (Signed)
Summary: Call fr appointment  Phone Note Call from Patient   Caller: Patient Call For: Latoya Fusi  DO Summary of Call: Call from pt stating that she spoke with Dr. Phillips Odor who advised her to come to the Clinics for Admission if she continuues to have shortness of breath Initial call taken by: Angelina Ok RN,  October 02, 2009 10:02 AM

## 2010-08-31 NOTE — Progress Notes (Signed)
Summary: Refill/gh  Phone Note Refill Request Message from:  Fax from Pharmacy on April 02, 2010 12:56 PM  Refills Requested: Medication #1:  ALPRAZOLAM 1 MG  TABS Take 1 tablet by mouth three times a day as needed for anxiety   Last Refilled: 03/02/2010 Last office visit 03/11/2010.  Last labs were 01/14/2010.   Method Requested: Electronic Initial call taken by: Angelina Ok RN,  April 02, 2010 12:56 PM  Follow-up for Phone Call       Follow-up by: Blanch Media MD,  April 02, 2010 1:56 PM  Additional Follow-up for Phone Call Additional follow up Details #1::        Rx called to pharmacy Additional Follow-up by: Angelina Ok RN,  April 02, 2010 2:44 PM    Prescriptions: ALPRAZOLAM 1 MG  TABS (ALPRAZOLAM) Take 1 tablet by mouth three times a day as needed for anxiety  #90 x 3   Entered and Authorized by:   Blanch Media MD   Signed by:   Blanch Media MD on 04/02/2010   Method used:   [facility]phoned to ...       [facility] [address] [phone]* (retail)       [address]       [place], [place]  [address]       Ph: [phone] or [phone]       Fax: [phone]   RxID:   [id number]

## 2010-08-31 NOTE — Progress Notes (Signed)
Summary: REfill/gh  Phone Note Refill Request Message from:  Fax from Pharmacy on June 21, 2010 2:53 PM  Refills Requested: Medication #1:  LORAZEPAM 1 MG TABS Take 1 tablet by mouth once a day at bedtime   Last Refilled: 05/19/2010  Method Requested: Electronic Initial call taken by: Angelina Ok RN,  June 21, 2010 2:53 PM  Follow-up for Phone Call        Refill approved-nurse to complete Follow-up by: Julaine Fusi  DO,  June 21, 2010 3:35 PM  Additional Follow-up for Phone Call Additional follow up Details #1::        Rx faxed to pharmacy Additional Follow-up by: Angelina Ok RN,  June 21, 2010 3:57 PM    Prescriptions: LORAZEPAM 1 MG TABS (LORAZEPAM) Take 1 tablet by mouth once a day at bedtime  #30 x 2   Entered and Authorized by:   Julaine Fusi  DO   Signed by:   Julaine Fusi  DO on 06/21/2010   Method used:   Telephoned to ...       [facility] [address] [phone][facility] (retail)       [address]       [place], [place]  [address]       Ph: [phone] or [phone]       Fax: [phone]   RxID:   [id number]

## 2010-09-02 NOTE — Progress Notes (Signed)
Summary: Refill/gh  Phone Note Refill Request Message from:  Fax from Pharmacy on July 28, 2010 11:24 AM  Refills Requested: Medication #1:  ALPRAZOLAM 1 MG  TABS Take 1 tablet by mouth three times a day as needed for anxiety   Last Refilled: 06/29/2010  Method Requested: Electronic Initial call taken by: Angelina Ok RN,  July 28, 2010 11:24 AM  Follow-up for Phone Call        Refill approved-nurse to complete Follow-up by: Julaine Fusi  DO,  July 29, 2010 2:02 PM  Additional Follow-up for Phone Call Additional follow up Details #1::        Rx called to pharmacy Additional Follow-up by: Angelina Ok RN,  July 29, 2010 2:29 PM    Prescriptions: ALPRAZOLAM 1 MG  TABS (ALPRAZOLAM) Take 1 tablet by mouth three times a day as needed for anxiety  #90 x 3   Entered and Authorized by:   Julaine Fusi  DO   Signed by:   Julaine Fusi  DO on 07/29/2010   Method used:   Telephoned to ...       [facility] [address] [phone]* (retail)       [address]       [place], [place]  [address]       Ph: [phone] or [phone]       Fax: [phone]   RxID:   [id number]

## 2010-09-02 NOTE — Progress Notes (Signed)
Summary: refill/gg  Phone Note Refill Request  on August 05, 2010 10:28 AM  Refills Requested: Medication #1:  DURAGESIC-75 48 MCG/HR PT72 Apply one patch as directed every 48 hours (new instructions)   Last Refilled: 04/30/2010 call pt when ready [phone]   Method Requested: Pick up at Office Initial call taken by: Merrie Roof RN,  August 05, 2010 10:28 AM  Follow-up for Phone Call        Refill approved-nurse to complete Follow-up by: Julaine Fusi  DO,  August 05, 2010 2:19 PM  Additional Follow-up for Phone Call Additional follow up Details #1::        pt informed Rx is ready Additional Follow-up by: Merrie Roof RN,  August 05, 2010 5:30 PM    Prescriptions: DURAGESIC-75 75 MCG/HR PT72 (FENTANYL) Apply one patch as directed every 48 hours (new instructions)  #16 x 0   Entered and Authorized by:   Julaine Fusi  DO   Signed by:   Julaine Fusi  DO on 08/05/2010   Method used:   Print then Give to Patient   RxID:   [id number]

## 2010-09-02 NOTE — Medication Information (Signed)
Summary: DURAGESIC/  DURAGESIC/   Imported By: Margie Billet 08/11/2010 11:34:52  _____________________________________________________________________  External Attachment:    Type:   Image     Comment:   External Document

## 2010-09-08 ENCOUNTER — Other Ambulatory Visit: Payer: Self-pay | Admitting: Internal Medicine

## 2010-09-10 ENCOUNTER — Other Ambulatory Visit: Payer: Self-pay | Admitting: *Deleted

## 2010-09-10 ENCOUNTER — Telehealth: Payer: Self-pay | Admitting: *Deleted

## 2010-09-10 DIAGNOSIS — M79606 Pain in leg, unspecified: Secondary | ICD-10-CM

## 2010-09-10 MED ORDER — DICLOFENAC SODIUM 1 % TD GEL: TRANSDERMAL | Status: DC

## 2010-09-10 NOTE — Telephone Encounter (Signed)
[name] called and states pharmacy called her and said that diclofenac sodium (VOLTAREN) 1 % GEL Is on backorder.  Can you please call in something else?

## 2010-09-10 NOTE — Telephone Encounter (Signed)
Script sent electronically 

## 2010-09-10 NOTE — Telephone Encounter (Signed)
Will write her for Voltaren Gel. Can you find out if it is muscle spasm, deep pain?, or and lesions. Let her know she may have to be seen by one of the residents while I am here in clinic as an attending.

## 2010-09-10 NOTE — Telephone Encounter (Signed)
Called pt, informed her to pick up at [facility]

## 2010-09-10 NOTE — Telephone Encounter (Signed)
Pt calls and c/o bil upper leg pain for several days, would like for dr Phillips Odor to order her something or see her. Please advise

## 2010-09-10 NOTE — Telephone Encounter (Signed)
I am not going to go up on her opiates. Have her try heat- if it is spasm we could try a low dose med for that. Probably

## 2010-09-15 NOTE — Telephone Encounter (Signed)
Pt called and the pain in legs has improved.  She will try heat if it returns.

## 2010-09-16 ENCOUNTER — Ambulatory Visit (HOSPITAL_COMMUNITY)
Admission: RE | Admit: 2010-09-16 | Discharge: 2010-09-16 | Disposition: A | Discharge status: Home / Self Care | Payer: Medicaid Other | Source: Ambulatory Visit | Attending: Internal Medicine | Admitting: Internal Medicine

## 2010-09-16 DIAGNOSIS — E0590 Thyrotoxicosis, unspecified without thyrotoxic crisis or storm: Secondary | ICD-10-CM

## 2010-09-17 ENCOUNTER — Ambulatory Visit (HOSPITAL_COMMUNITY)
Admission: RE | Admit: 2010-09-17 | Discharge: 2010-09-17 | Disposition: A | Discharge status: Home / Self Care | Payer: Medicare Other | Source: Ambulatory Visit | Attending: Internal Medicine | Admitting: Internal Medicine

## 2010-09-17 DIAGNOSIS — E0590 Thyrotoxicosis, unspecified without thyrotoxic crisis or storm: Secondary | ICD-10-CM | POA: Insufficient documentation

## 2010-09-17 MED ORDER — SODIUM IODIDE I 131 CAPSULE: 0.0100 | Freq: Once | INTRAVENOUS | Status: AC | PRN

## 2010-09-17 MED ORDER — SODIUM PERTECHNETATE TC 99M INJECTION
10.0000 | Freq: Once | INTRAVENOUS | Status: AC | PRN
  Administered 2010-09-17: 10 via INTRAVENOUS

## 2010-09-22 NOTE — Assessment & Plan Note (Signed)
Summary: EST-CK/FU/MEDS/CFB   Vital Signs:  Patient profile:   50 year old female Height:      67 inches (170.18 cm) Weight:      120.2 pounds (54.64 kg) BMI:     18.89 Temp:     97.9 degrees F (36.61 degrees C) oral Pulse rate:   96 / minute BP sitting:   128 / 80  (left arm)  Vitals Entered By: Stanton Kidney Ditzler RN (August 27, 2010 9:53 AM) Is Patient Diabetic? No Pain Assessment Patient in pain? yes     Location: neck and shoulders Intensity: 9 Type: aching Onset of pain  long time Nutritional Status BMI of < 19 = underweight Nutritional Status Detail appetite down  Have you ever been in a relationship where you felt threatened, hurt or afraid?denies   Does patient need assistance? Functional Status Self care Ambulation Normal Comments Discuss neck and shoulder pain.   Primary Care Provider:  Julaine Fusi  DO   History of Present Illness: [name] comes in with continued chronic pain complaints. Flare of upper back pain, tenderpoints. Has not been sleeping well. More SOB with exertion. Generally fatigued. Depression stable no major changes.   Depression History:      The patient denies a depressed mood most of the day and a diminished interest in her usual daily activities.         Preventive Screening-Counseling & Management  Alcohol-Tobacco     Smoking Status: current     Smoking Cessation Counseling: yes     Packs/Day: 1 ppd     Year Started: AT THE AGE OF [age]  Caffeine-Diet-Exercise     Does Patient Exercise: no     Exercise (avg: min/session): 3:15  Allergies: 1)  ! * Ambien 2)  ! Lyrica 3)  ! Prednisone  Physical Exam  General:  alert and well-developed.  Mildly uncomfortable appearing. Head:  Scalp lesion improved. Neck:  calcified right cervical lymph node, stable. Lungs:  normal respiratory effort and scattered wheezes.  Heart:  normal rate, regular rhythm, and no murmur.   Abdomen:  soft and non-tender.   Msk:  Multipl tenderpoints worse  in scapulaothoracic region. Pulses:  R and L carotid,radial,femoral,dorsalis pedis and posterior tibial pulses are full and equal bilaterally Extremities:  mild clubbing in fingers, skin is dusky at baseline Neurologic:  alert & oriented X3 and strength normal in all extremities.   Skin:  Dark skin with solar damage, scattered areas of mild psoriasis. No other rashes. Psych:  depressed appearing, no SI   Impression & Recommendations:  Problem # 1:  LYMPHADENOPATHY (ICD-785.6) Stable no change.  Problem # 2:  HYPERTHYROIDISM (ICD-242.90) Will repeat thyroid studies. She has an elevated T4-borderline- she is symptomatic so I will proceed with uptake scan. Orders: i-131 Thyroid uptake and scan (Capsule & Scan) Nuc Med Diagnostic (thyroid uptake & sca) T-Comprehensive Metabolic Panel (09811-91478) T-Lipid Profile (29562-13086) T-CBC w/Diff (57846-96295) T-TSH (28413-24401) T-T4, Free (02725-36644)  Problem # 3:  VAGINAL DISCHARGE (ICD-623.5) Will check UA. Mild symptoms reported. Orders: T-Urinalysis (03474-25956)  Problem # 4:  DYSPNEA (ICD-786.05) PFTs ordered-will f/u results. Chronic Obstructive Lung Disease.  Problem # 5:  WEIGHT LOSS-ABNORMAL (ICD-783.21) Elevated serum protien. Will check SPEP. Occult malignancy still high on differential.  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   Orders: T-Prot. Elect. (Serum) [phone])  Problem # 7:  GERD (ICD-530.81) Refilled her meds. May need repeat endoscopy. Her updated medication list for this problem  includes:    Nexium 40 Mg Cpdr (Esomeprazole magnesium) .Marland Kitchen... Take 1 tablet by mouth once a day  Problem # 8:  CHRONIC PAIN SYNDROME (ICD-338.4) May be underlying rhuematoid problem. consistently ANA negative, RA w/u negative.  Complete Medication List: 1)  Alprazolam 1 Mg Tabs (Alprazolam) .... Take 1 tablet by mouth three times a day as needed for anxiety 2)  Nexium 40 Mg  Cpdr (Esomeprazole magnesium) .... Take 1 tablet by mouth once a day 3)  Polyethylene Glycol 3350 Powd (Polyethylene glycol 3350) .... Use 17 grams in 10 oz of water once or twice daily for constipation  titrate as needed 4)  Fentanyl 100 Mcg/hr Pt72 (Fentanyl) .... Apply one patch every 48 hours as directed 5)  Symbicort 160-4.5 Mcg/act Aero (Budesonide-formoterol fumarate) .... One puff twice daily as directed 6)  Spiriva Handihaler 18 Mcg Caps (Tiotropium bromide monohydrate) .... Contents of one capsule inhaled daily 7)  Proair Hfa 108 (90 Base) Mcg/act Aers (Albuterol sulfate) .... Take 1-2 puffs every 4 hours as needed for shortness of breath 8)  Lorazepam 1 Mg Tabs (Lorazepam) .... Take 1 tablet by mouth once a day at bedtime 9)  Cyanocobalamin 1000 Mcg/ml Soln (Cyanocobalamin) .... Qmonthly 10)  Docusate Sodium 100 Mg Tabs (Docusate sodium) .... Take 1 tablet by mouth two times a day 11)  Hydrocodone-acetaminophen 10-325 Mg Tabs (Hydrocodone-acetaminophen) .... Take 1 tablet by mouth every 8 hours as needed for pain 12)  Clonidine Hcl 0.1 Mg Tabs (Clonidine hcl) .... Take 1 tablet by mouth once a day at bedtime  Other Orders: Admin of Therapeutic Inj  intramuscular or subcutaneous (04540) Vit B12 1000 mcg (J8119) T-Culture, Urine (14782-95621) T-Vitamin B12 (30865-78469)   Patient Instructions: 1)  Please schedule a follow-up appointment in 3 months. Prescriptions: CLONIDINE HCL 0.1 MG TABS (CLONIDINE HCL) Take 1 tablet by mouth once a day at bedtime  #30 x 0   Entered and Authorized by:   Julaine Fusi  DO   Signed by:   Julaine Fusi  DO on 08/30/2010   Method used:   Electronically to        [facility] [address] [phone]* (retail)       [address]       [place], [place]  [address]       Ph: [phone] or [phone]       Fax: [phone]   RxID:   [id number] HYDROCODONE-ACETAMINOPHEN 10-325 MG TABS (HYDROCODONE-ACETAMINOPHEN) Take 1 tablet by  mouth every 8 hours as needed for pain  #90 x 3   Entered and Authorized by:   Julaine Fusi  DO   Signed by:   Julaine Fusi  DO on 08/27/2010   Method used:   Print then Give to Patient   RxID:   [id number] FENTANYL 100 MCG/HR PT72 (FENTANYL) Apply one patch every 48 hours as directed  #15 x 0   Entered and Authorized by:   Julaine Fusi  DO   Signed by:   Julaine Fusi  DO on 08/27/2010   Method used:   Print then Give to Patient   RxID:   [id number]    Medication Administration  Injection # 1:    Medication: Vit B12 1000 mcg    Diagnosis: B12 DEFICIENCY (ICD-266.2)    Route: IM    Site: L deltoid    Exp Date: 01/2012    Lot #: [id number]    Mfr: [employer]  Patient tolerated injection without complications    Given by: Stanton Kidney Ditzler RN (August 27, 2010 12:13 PM)  Orders Added: 1)  Est. Patient Level V [54098] 2)  Admin of Therapeutic Inj  intramuscular or subcutaneous [96372] 3)  Vit B12 1000 mcg [J3420] 4)  i-131 Thyroid uptake and scan (Capsule & Scan) Nuc Med Diagnostic [thyroid uptake & sca] 5)  T-Culture, Urine [11914-78295] 6)  T-Comprehensive Metabolic Panel [80053-22900] 7)  T-Lipid Profile [80061-22930] 8)  T-CBC w/Diff [62130-86578] 9)  T-Vitamin B12 [82607-23330] 10)  T-Urinalysis [81003-65000] 11)  T-TSH [46962-95284] 12)  T-T4, Free [13244-01027] 13)  T-Prot. Elect. (Serum) [[id number]]   Process Orders Check Orders Results:     [facility]: Check successful Tests Sent for requisitioning (September 13, 2010 10:34 AM):     08/27/2010: [facility] -- T-Culture, Urine [[id number]] (signed)     08/27/2010: [facility] -- T-Comprehensive Metabolic Panel [80053-22900] (signed)     08/27/2010: [facility] -- T-Lipid Profile [phone] (signed)     08/27/2010: [facility] -- T-CBC w/Diff [84166-06301] (signed)     08/27/2010: [facility] --  T-Vitamin B12 [phone] (signed)     08/27/2010: [facility] -- T-Urinalysis [81003-65000] (signed)     08/27/2010: [facility] -- T-TSH [phone] (signed)     08/27/2010: [facility] -- [place], [place] [[id number]] (signed)     08/27/2010: [facility] -- T-Prot. Elect. (Serum) [id number] (signed)     Prevention & Chronic Care Immunizations   Influenza vaccine: Fluvax MCR  (04/30/2008)   Influenza vaccine deferral: Refused  (09/10/2009)    Tetanus booster: Not documented   Td booster deferral: Deferred  (09/10/2009)    Pneumococcal vaccine: Not documented  Colorectal Screening   Hemoccult: Not documented    Colonoscopy: Not documented  Other Screening   Pap smear: Not documented    Mammogram: ASSESSMENT: Negative - BI-RADS 1^MM DIGITAL SCREENING  (01/07/2009)   Mammogram due: 11/2008   Smoking status: current  (08/27/2010)   Smoking cessation counseling: yes  (08/27/2010)  Lipids   Total Cholesterol: 186  (10/27/2008)   LDL: 117  (10/27/2008)   LDL Direct: Not documented   HDL: 43  (10/27/2008)   Triglycerides: 132  (10/27/2008)      Resource handout printed.  Process Orders Check Orders Results:     [facility]: Check successful Tests Sent for requisitioning (September 13, 2010 10:34 AM):     08/27/2010: [facility] -- T-Culture, Urine [[id number]] (signed)     08/27/2010: [facility] -- T-Comprehensive Metabolic Panel [80053-22900] (signed)     08/27/2010: [facility] -- T-Lipid Profile [phone] (signed)     08/27/2010: [facility] -- T-CBC w/Diff [81829-93716] (signed)     08/27/2010: [facility] -- T-Vitamin B12 [phone] (signed)     08/27/2010: [facility] -- T-Urinalysis [81003-65000] (signed)     08/27/2010: [facility] -- T-TSH [id number]  (signed)     08/27/2010: [facility] -- [place], [place] [[id number]] (signed)     08/27/2010: [facility] -- T-Prot. Elect. (Serum) [phone] (signed)     Medication Administration  Injection # 1:    Medication: Vit B12 1000 mcg    Diagnosis: B12 DEFICIENCY (ICD-266.2)    Route: IM    Site: L deltoid    Exp Date: 01/2012    Lot #: [id number]    Mfr: [employer]    Patient tolerated injection without complications  Given by: Stanton Kidney Ditzler RN (August 27, 2010 12:13 PM)  Orders Added: 1)  Est. Patient Level V [09811] 2)  Admin of Therapeutic Inj  intramuscular or subcutaneous [96372] 3)  Vit B12 1000 mcg [J3420] 4)  i-131 Thyroid uptake and scan (Capsule & Scan) Nuc Med Diagnostic [thyroid uptake & sca] 5)  T-Culture, Urine [91478-29562] 6)  T-Comprehensive Metabolic Panel [80053-22900] 7)  T-Lipid Profile [80061-22930] 8)  T-CBC w/Diff [13086-57846] 9)  T-Vitamin B12 [82607-23330] 10)  T-Urinalysis [81003-65000] 11)  T-TSH [96295-28413] 12)  T-T4, Free [24401-02725] 13)  T-Prot. Elect. (Serum) [[id number]]

## 2010-09-24 ENCOUNTER — Telehealth: Payer: Self-pay | Admitting: *Deleted

## 2010-09-24 ENCOUNTER — Encounter: Payer: Self-pay | Admitting: Internal Medicine

## 2010-09-24 ENCOUNTER — Ambulatory Visit: Payer: Self-pay | Admitting: Internal Medicine

## 2010-09-24 DIAGNOSIS — F419 Anxiety disorder, unspecified: Secondary | ICD-10-CM

## 2010-09-24 MED ORDER — LORAZEPAM 1 MG PO TABS: 1.0000 mg | ORAL_TABLET | Freq: Every day | ORAL | Status: DC

## 2010-09-24 NOTE — Telephone Encounter (Signed)
Approved. See prior request.

## 2010-09-27 ENCOUNTER — Other Ambulatory Visit: Payer: Self-pay | Admitting: *Deleted

## 2010-09-27 NOTE — Telephone Encounter (Signed)
Called to pharm 

## 2010-09-27 NOTE — Telephone Encounter (Signed)
I will need to see her to before I make a change like that.

## 2010-09-27 NOTE — Telephone Encounter (Signed)
Last refill of fentanyl 1/27,  Pt is asking for 100 mcg.

## 2010-09-28 ENCOUNTER — Other Ambulatory Visit: Payer: Self-pay | Admitting: Internal Medicine

## 2010-09-28 ENCOUNTER — Telehealth: Payer: Self-pay | Admitting: Internal Medicine

## 2010-09-28 DIAGNOSIS — F419 Anxiety disorder, unspecified: Secondary | ICD-10-CM

## 2010-09-28 MED ORDER — LORAZEPAM 1 MG PO TABS: 1.0000 mg | ORAL_TABLET | Freq: Every day | ORAL | Status: AC

## 2010-09-28 MED ORDER — FENTANYL 100 MCG/HR TD PT72: 1.0000 | MEDICATED_PATCH | TRANSDERMAL | Status: DC

## 2010-09-28 NOTE — Telephone Encounter (Signed)
Message copied by Anderson Malta on Tue Sep 28, 2010  2:11 PM ------      Message from: Lucrezia Europe [facility])      Created: Tue Sep 28, 2010 12:07 PM      Contact: Patient       Patient needs more lorazepam and fentanyl.  Please confirm [phone]

## 2010-09-28 NOTE — Telephone Encounter (Signed)
I got a note that her insurance does not cover lorazepam so I sent a phone script note to rx nurse for diazepam which is covered.

## 2010-09-28 NOTE — Telephone Encounter (Signed)
Message copied by Anderson Malta on Tue Sep 28, 2010  2:16 PM ------      Message from: Lucrezia Europe [facility])      Created: Tue Sep 28, 2010 12:07 PM      Contact: Patient       Patient needs more lorazepam and fentanyl.  Please confirm [phone]

## 2010-09-28 NOTE — Telephone Encounter (Signed)
Ok to call in Lorazepam- I got a note that her insurance would not cover it and they would only cover diazepam. I can call in either.

## 2010-09-28 NOTE — Telephone Encounter (Signed)
i have called the pharm again, and it is ready to be picked up

## 2010-10-14 LAB — CARDIAC PANEL(CRET KIN+CKTOT+MB+TROPI)
CK, MB: 0.6 ng/mL (ref 0.3–4.0)
CK, MB: 0.6 ng/mL (ref 0.3–4.0)
Relative Index: INVALID (ref 0.0–2.5)
Relative Index: INVALID (ref 0.0–2.5)
Relative Index: INVALID (ref 0.0–2.5)
Total CK: 46 U/L (ref 7–177)
Total CK: 51 U/L (ref 7–177)
Troponin I: 0.01 ng/mL (ref 0.00–0.06)

## 2010-10-14 LAB — OPIATE, QUANTITATIVE, URINE
Hydrocodone: 1379 NG/ML — ABNORMAL HIGH
Hydromorphone GC/MS Conf: NEGATIVE NG/ML
Morphine, Confirm: NEGATIVE NG/ML
Oxycodone, ur: NEGATIVE NG/ML

## 2010-10-14 LAB — URINALYSIS, ROUTINE W REFLEX MICROSCOPIC
Bilirubin Urine: NEGATIVE
Glucose, UA: NEGATIVE mg/dL
Hgb urine dipstick: NEGATIVE
Ketones, ur: NEGATIVE mg/dL
Nitrite: NEGATIVE
Protein, ur: NEGATIVE mg/dL
Specific Gravity, Urine: 1.022 (ref 1.005–1.030)
Urobilinogen, UA: 1 mg/dL (ref 0.0–1.0)
pH: 6 (ref 5.0–8.0)

## 2010-10-14 LAB — CULTURE, BLOOD (SINGLE)
Culture  Setup Time: 201109300139
Culture: NO GROWTH

## 2010-10-14 LAB — COMPREHENSIVE METABOLIC PANEL
ALT: 11 U/L (ref 0–35)
AST: 21 U/L (ref 0–37)
Albumin: 4.1 g/dL (ref 3.5–5.2)
Calcium: 9.6 mg/dL (ref 8.4–10.5)
Creatinine, Ser: 0.73 mg/dL (ref 0.4–1.2)
GFR calc Af Amer: >60 mL/min (ref >60)
Sodium: 138 mEq/L (ref 135–145)
Total Protein: 7.7 g/dL (ref 6.0–8.3)

## 2010-10-14 LAB — MAGNESIUM: Magnesium: 2.2 mg/dL (ref 1.5–2.5)

## 2010-10-14 LAB — BENZODIAZEPINE, QUANTITATIVE, URINE
Alprazolam (GC/LC/MS), ur confirm: 545 ng/mL — ABNORMAL HIGH
Flurazepam GC/MS Conf: NEGATIVE ng/mL
Lorazepam UR QT: NEGATIVE ng/mL
Nordiazepam GC/MS Conf: NEGATIVE ng/mL
Oxazepam GC/MS Conf: NEGATIVE ng/mL
Temazepam GC/MS Conf: NEGATIVE ng/mL

## 2010-10-14 LAB — CBC
HCT: 42.5 % (ref 36.0–46.0)
Hemoglobin: 14.7 g/dL (ref 12.0–15.0)
MCH: 32.5 pg (ref 26.0–34.0)
MCHC: 34.6 g/dL (ref 30.0–36.0)

## 2010-10-14 LAB — URINE DRUGS OF ABUSE SCREEN W ALC, ROUTINE (REF LAB)
Amphetamine Screen, Ur: NEGATIVE
Barbiturate Quant, Ur: NEGATIVE
Benzodiazepines.: POSITIVE — AB
Cocaine Metabolites: NEGATIVE
Marijuana Metabolite: POSITIVE — AB
Phencyclidine (PCP): NEGATIVE
Propoxyphene: NEGATIVE

## 2010-10-14 LAB — DIFFERENTIAL
Basophils Relative: 0 % (ref 0–1)
Eosinophils Absolute: 0 10*3/uL (ref 0.0–0.7)
Monocytes Absolute: 0.3 10*3/uL (ref 0.1–1.0)
Monocytes Relative: 9 % (ref 3–12)
Neutrophils Relative %: 56 % (ref 43–77)

## 2010-10-14 LAB — T4, FREE: Free T4: 1.11 ng/dL (ref 0.80–1.80)

## 2010-10-14 LAB — TECHNOLOGIST SMEAR REVIEW

## 2010-10-14 LAB — VITAMIN B12: Vitamin B-12: 308 pg/mL (ref 211–911)

## 2010-10-14 LAB — THC (MARIJUANA), URINE, CONFIRMATION: Marijuana, Ur-Confirmation: 689 ng/mL — ABNORMAL HIGH

## 2010-10-14 LAB — PROTIME-INR
INR: 0.95 (ref 0.00–1.49)
Prothrombin Time: 12.9 s (ref 11.6–15.2)

## 2010-10-14 LAB — BRAIN NATRIURETIC PEPTIDE: Pro B Natriuretic peptide (BNP): 30 pg/mL (ref 0.0–100.0)

## 2010-10-25 LAB — POCT I-STAT 3, ART BLOOD GAS (G3+)
Acid-Base Excess: 1 mmol/L (ref 0.0–2.0)
Acid-base deficit: 8 mmol/L — ABNORMAL HIGH (ref 0.0–2.0)
Bicarbonate: 29.8 mEq/L — ABNORMAL HIGH (ref 20.0–24.0)
O2 Saturation: 100 %
pCO2 arterial: 23 mmHg — ABNORMAL LOW (ref 35.0–45.0)
pCO2 arterial: 42.8 mmHg (ref 35.0–45.0)
pH, Arterial: 7.411 — ABNORMAL HIGH (ref 7.350–7.400)
pH, Arterial: 7.476 — ABNORMAL HIGH (ref 7.350–7.400)
pO2, Arterial: 120 mmHg — ABNORMAL HIGH (ref 80.0–100.0)
pO2, Arterial: 172 mmHg — ABNORMAL HIGH (ref 80.0–100.0)
pO2, Arterial: 175 mmHg — ABNORMAL HIGH (ref 80.0–100.0)

## 2010-10-25 LAB — LIPASE, BLOOD: Lipase: 17 U/L (ref 11–59)

## 2010-10-25 LAB — CBC
HCT: 39.6 % (ref 36.0–46.0)
Hemoglobin: 11.5 g/dL — ABNORMAL LOW (ref 12.0–15.0)
Hemoglobin: 12.9 g/dL (ref 12.0–15.0)
MCHC: 32.7 g/dL (ref 30.0–36.0)
MCHC: 33.9 g/dL (ref 30.0–36.0)
MCV: 97.7 fL (ref 78.0–100.0)
MCV: 97.7 fL (ref 78.0–100.0)
Platelets: 254 10*3/uL (ref 150–400)
Platelets: 280 10*3/uL (ref 150–400)
RBC: 3.46 MIL/uL — ABNORMAL LOW (ref 3.87–5.11)
RDW: 12.7 % (ref 11.5–15.5)
RDW: 12.7 % (ref 11.5–15.5)
WBC: 11.1 10*3/uL — ABNORMAL HIGH (ref 4.0–10.5)

## 2010-10-25 LAB — POCT I-STAT, CHEM 8
Calcium, Ion: 1.15 mmol/L (ref 1.12–1.32)
Creatinine, Ser: 0.4 mg/dL (ref 0.4–1.2)
Glucose, Bld: 95 mg/dL (ref 70–99)
HCT: 38 % (ref 36.0–46.0)
Hemoglobin: 12.9 g/dL (ref 12.0–15.0)
Potassium: 3.7 mEq/L (ref 3.5–5.1)

## 2010-10-25 LAB — TSH: TSH: 1.153 u[IU]/mL (ref 0.350–4.500)

## 2010-10-25 LAB — BASIC METABOLIC PANEL
BUN: 9 mg/dL (ref 6–23)
BUN: 9 mg/dL (ref 6–23)
Calcium: 9 mg/dL (ref 8.4–10.5)
Calcium: 9.6 mg/dL (ref 8.4–10.5)
Creatinine, Ser: 0.66 mg/dL (ref 0.4–1.2)
Creatinine, Ser: 0.73 mg/dL (ref 0.4–1.2)
GFR calc Af Amer: >60 mL/min (ref >60)
GFR calc non Af Amer: >60 mL/min (ref >60)
GFR calc non Af Amer: >60 mL/min (ref >60)
Glucose, Bld: 115 mg/dL — ABNORMAL HIGH (ref 70–99)
Potassium: 4.4 mEq/L (ref 3.5–5.1)
Sodium: 132 mEq/L — ABNORMAL LOW (ref 135–145)

## 2010-10-25 LAB — COMPREHENSIVE METABOLIC PANEL
BUN: 14 mg/dL (ref 6–23)
CO2: 27 mEq/L (ref 19–32)
Calcium: 9.3 mg/dL (ref 8.4–10.5)
Creatinine, Ser: 0.57 mg/dL (ref 0.4–1.2)
GFR calc non Af Amer: >60 mL/min (ref >60)
Glucose, Bld: 134 mg/dL — ABNORMAL HIGH (ref 70–99)

## 2010-10-25 LAB — BLOOD GAS, ARTERIAL
Acid-base deficit: 0.4 mmol/L (ref 0.0–2.0)
Bicarbonate: 23.4 mEq/L (ref 20.0–24.0)
FIO2: 0.21 %
O2 Saturation: 98.8 %
Patient temperature: 98.6
pO2, Arterial: 116 mmHg — ABNORMAL HIGH (ref 80.0–100.0)

## 2010-10-25 LAB — GLUCOSE, CAPILLARY
Glucose-Capillary: 114 mg/dL — ABNORMAL HIGH (ref 70–99)
Glucose-Capillary: 156 mg/dL — ABNORMAL HIGH (ref 70–99)

## 2010-10-25 LAB — DIFFERENTIAL
Basophils Absolute: 0 10*3/uL (ref 0.0–0.1)
Basophils Relative: 1 % (ref 0–1)
Eosinophils Absolute: 0.1 10*3/uL (ref 0.0–0.7)
Eosinophils Relative: 2 % (ref 0–5)
Lymphocytes Relative: 37 % (ref 12–46)
Monocytes Absolute: 0.4 10*3/uL (ref 0.1–1.0)

## 2010-10-25 LAB — POCT CARDIAC MARKERS
CKMB, poc: <1 ng/mL — ABNORMAL LOW (ref 1.0–8.0)
CKMB, poc: <1 ng/mL — ABNORMAL LOW (ref 1.0–8.0)
Myoglobin, poc: 69.4 ng/mL (ref 12–200)
Troponin i, poc: <0.05 ng/mL (ref 0.00–0.09)

## 2010-10-25 LAB — ALPHA-1-ANTITRYPSIN: A-1 Antitrypsin, Ser: 162 mg/dL (ref 83–200)

## 2010-10-25 LAB — PROTIME-INR: Prothrombin Time: 12.5 seconds (ref 11.6–15.2)

## 2010-10-25 LAB — MRSA PCR SCREENING: MRSA by PCR: NEGATIVE

## 2010-10-26 ENCOUNTER — Other Ambulatory Visit: Payer: Self-pay | Admitting: *Deleted

## 2010-10-26 DIAGNOSIS — G8929 Other chronic pain: Secondary | ICD-10-CM

## 2010-10-26 NOTE — Telephone Encounter (Signed)
Last filled 2/28

## 2010-10-26 NOTE — Telephone Encounter (Signed)
[name],   I cannot tell from the EPIC or Centricity notes why this patient is on a huge opiate dose.  All I could find was "chronic pain syndrome; may be rheumatologic but labs are negative".

## 2010-10-28 MED ORDER — FENTANYL 100 MCG/HR TD PT72: 1.0000 | MEDICATED_PATCH | TRANSDERMAL | Status: DC

## 2010-10-28 NOTE — Telephone Encounter (Signed)
I am trying to refer this to Dr. Phillips Odor.  I cannot find a reason for this med at this dose.

## 2010-10-28 NOTE — Telephone Encounter (Signed)
Pt calling again, upset med has not been refilled

## 2010-10-28 NOTE — Telephone Encounter (Signed)
If she is literally out, and we cannot get an answer from [facility] , I will refill for 4 days (2 patches).

## 2010-10-28 NOTE — Telephone Encounter (Signed)
Reviewed patient records. Dose increased on 09/10/2010 due to worsening pain control. C-Spine radiculopathy, OA, myofascial syndrome. Will refill until I can see her in follow-up. Will continue to do opiate/chronic pain education and management. Random UDS today.

## 2010-11-17 ENCOUNTER — Telehealth: Payer: Self-pay | Admitting: *Deleted

## 2010-11-17 NOTE — Telephone Encounter (Addendum)
I called patient  She is on Fentanyl Patches, hydrocodone, and I advised her we really were not going to add any additional pain medications-use heat, rest, let it get better on its own  Additionaly, on brief review of med-problem list a little difficult to sort out why she is on such large doses of chronic narcotics (presume her DDD of C-spine?)

## 2010-11-17 NOTE — Telephone Encounter (Signed)
Pt called with c/o hip pain and  Yesterday she was mowing and pulled muscle in rt hip.  Pain was immediate. She c/o pain level 9/10. Hurts to cough or get up and down and is continuous. She wants more pain meds.  She is on fentanyl  and she tried vicodin without relief.  Can she get something else?  Muscle relaxant?  Or do you want her to be seen? She slept on heating pad without relief.  Pt # [id number]

## 2010-11-24 ENCOUNTER — Other Ambulatory Visit: Payer: Self-pay | Admitting: *Deleted

## 2010-11-24 DIAGNOSIS — G8929 Other chronic pain: Secondary | ICD-10-CM

## 2010-11-24 NOTE — Telephone Encounter (Signed)
I will approve. Will drop of script in clinic as soon as possible.

## 2010-11-25 ENCOUNTER — Other Ambulatory Visit: Payer: Self-pay | Admitting: *Deleted

## 2010-11-26 MED ORDER — FENTANYL 100 MCG/HR TD PT72: 1.0000 | MEDICATED_PATCH | TRANSDERMAL | Status: DC

## 2010-11-26 MED ORDER — ALPRAZOLAM 1 MG PO TABS: 1.0000 mg | ORAL_TABLET | Freq: Three times a day (TID) | ORAL | Status: DC | PRN

## 2010-11-26 NOTE — Telephone Encounter (Signed)
Attending filled Rx as it's Friday and pt needs meds for week end

## 2010-11-26 NOTE — Telephone Encounter (Signed)
Rx given to pt. 

## 2010-12-09 ENCOUNTER — Ambulatory Visit (INDEPENDENT_AMBULATORY_CARE_PROVIDER_SITE_OTHER): Payer: Medicare Other | Admitting: Internal Medicine

## 2010-12-09 ENCOUNTER — Ambulatory Visit: Payer: Self-pay | Admitting: Internal Medicine

## 2010-12-09 ENCOUNTER — Encounter: Payer: Self-pay | Admitting: Internal Medicine

## 2010-12-09 VITALS — BP 101/62 | HR 79 | Temp 98.7°F | Ht 67.0 in | Wt 127.5 lb

## 2010-12-09 DIAGNOSIS — G8929 Other chronic pain: Secondary | ICD-10-CM

## 2010-12-09 MED ORDER — FENTANYL 100 MCG/HR TD PT72: 1.0000 | MEDICATED_PATCH | TRANSDERMAL | Status: DC

## 2010-12-09 MED ORDER — OXYMORPHONE HCL 10 MG PO TABS: 10.0000 mg | ORAL_TABLET | Freq: Four times a day (QID) | ORAL | Status: DC | PRN

## 2010-12-09 MED ORDER — ALPRAZOLAM 1 MG PO TABS: 1.0000 mg | ORAL_TABLET | Freq: Three times a day (TID) | ORAL | Status: DC | PRN

## 2010-12-10 ENCOUNTER — Telehealth: Payer: Self-pay | Admitting: *Deleted

## 2010-12-10 NOTE — Telephone Encounter (Signed)
Pt called left message that she needed to explain about her opana, called the # left [phone], no answer, vmail full.

## 2010-12-13 NOTE — Telephone Encounter (Signed)
I will contact her-should be used for breakthrough pain. Not an extended release med.

## 2010-12-14 ENCOUNTER — Telehealth: Payer: Self-pay | Admitting: Internal Medicine

## 2010-12-14 MED ORDER — LORAZEPAM 2 MG PO TABS: ORAL_TABLET | ORAL | Status: DC

## 2010-12-14 NOTE — Telephone Encounter (Signed)
Called to pharmacy 

## 2010-12-14 NOTE — Assessment & Plan Note (Signed)
[facility]                         GASTROENTEROLOGY OFFICE NOTE   NAME:[name], [name]                      MRN:          [id number]  DATE:08/10/2007                            DOB:          [birth year]    REASON FOR CONSULTATION:  Chronic constipation, request colonoscopy.   HISTORY:  This is a 50 year old white female with a history of anxiety  disorder, chronic back pain, and chronic constipation.  She is referred  regarding the later and request of colonoscopy.  The patient reports a  many year history of problems with constipation.  She states she cannot  have a bowel movement without taking over-the-counter products.  Her  last bowel movement was 4 days ago.  She had been prescribed MiraLax  previously but had only been using is sporadically.  She also complains  of bloating and gas.  When her constipation is bad, she will have some  abdominal discomfort which she describes as spasm.  She has had no  nausea, vomiting, melena, hematochezia, or weight loss.  Actually, she  has had some weight gain.  She thinks she may have had a history of  ulcer disease remotely and undergone upper endoscopy remotely.  No  records available for review.   PAST MEDICAL HISTORY:  1. Anxiety/depression.  2. Remote history of cervical cancer, treated with local measures.  3. Chronic back pain.   ALLERGIES:  AMBIEN CAUSES HIVES.   CURRENT MEDICATIONS:  1. Alprazolam 1 mg t.i.d.  2. Symbicort 80/4.5 two puffs daily.  3. Flexeril 10 mg at night.  4. Also multiple laxatives and stool softeners p.r.n.   FAMILY HISTORY:  No family history of gastrointestinal malignancy.   SOCIAL HISTORY:  The patient is divorced with 1 daughter.  She lives  alone, 10th grade education, disabled due to psychiatric and back  issues.  She smokes a pack of cigarettes per day, does not use alcohol.   REVIEW OF SYSTEMS:  Per diagnostic evaluation form.   PHYSICAL EXAMINATION:   GENERAL:  A well-appearing female in no acute  distress.  VITAL SIGNS:  Blood pressure is 102/64, heart rate is 80 and regular,  weight 149.2 pounds.  She is 5 feet 7 inches in height.  HEENT:  Sclerae are anicteric.  Conjunctivae are pink.  Oral mucosa is  intact.  No adenopathy.  LUNGS:  Clear.  HEART:  Regular.  ABDOMEN:  Soft and minimally protuberant.  No mass felt.  Good bowel  sounds heard.  No tenderness.  EXTREMITIES:  Without edema.   IMPRESSION:  1. Chronic constipation.  2. Bloating, gas, abdominal discomfort due to the same.  3. Colon cancer screening baseline risk.   RECOMMENDATIONS:  1. Recommend MiraLax daily for constipation.  She may titrate to need.  2. Scheduled colonoscopy, principally to provide colorectal neoplasia      screening and to provide reassurance regarding symptoms.  The      nature of the procedure as well as the risks, benefits, and      alternatives have been reviewed.  She understood and agrees to      proceed.  3. Ongoing general medical care with the outpatient clinic and Dr.      Phillips Odor.     Wilhemina Bonito. Marina Goodell, MD  Electronically Signed    JNP/MedQ  DD: 08/10/2007  DT: 08/10/2007  Job #: [id number]   cc:   Edsel Petrin, D.O.

## 2010-12-15 ENCOUNTER — Ambulatory Visit (HOSPITAL_COMMUNITY)
Admission: RE | Admit: 2010-12-15 | Discharge: 2010-12-15 | Disposition: A | Discharge status: Home / Self Care | Payer: Medicare Other | Source: Ambulatory Visit | Attending: Internal Medicine | Admitting: Internal Medicine

## 2010-12-15 ENCOUNTER — Other Ambulatory Visit: Payer: Self-pay | Admitting: Internal Medicine

## 2010-12-15 DIAGNOSIS — M5020 Other cervical disc displacement, unspecified cervical region: Secondary | ICD-10-CM | POA: Insufficient documentation

## 2010-12-15 DIAGNOSIS — M542 Cervicalgia: Secondary | ICD-10-CM | POA: Insufficient documentation

## 2010-12-15 DIAGNOSIS — M47812 Spondylosis without myelopathy or radiculopathy, cervical region: Secondary | ICD-10-CM | POA: Insufficient documentation

## 2010-12-15 DIAGNOSIS — G8929 Other chronic pain: Secondary | ICD-10-CM

## 2010-12-15 DIAGNOSIS — M5030 Other cervical disc degeneration, unspecified cervical region: Secondary | ICD-10-CM | POA: Insufficient documentation
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
## 2010-12-17 NOTE — Op Note (Signed)
[name], [name]               ACCOUNT NO.:  [id number]   MEDICAL RECORD NO.:  [id number]          PATIENT TYPE:  AMB   LOCATION:  SDC                           FACILITY:  [facility]   PHYSICIAN:  Naima A. Dillard, M.D. DATE OF BIRTH:  10-16-1960   DATE OF PROCEDURE:  06/09/2006  DATE OF DISCHARGE:                                 OPERATIVE REPORT   PREOPERATIVE DIAGNOSIS:  Postmenopausal vaginal bleeding.   POSTOPERATIVE DIAGNOSIS:  Postmenopausal vaginal bleeding.   OPERATION/PROCEDURE:  1. Fractional dilatation and curettage.  2. Endometrial biopsy.  3. Attempted hysteroscopy.  4. Cervical polypectomy.   SURGEON:  Naima A. Normand Sloop, M.D.   ANESTHESIA:  General.   SPECIMENS:  Endometrial curettings and endocervical curettings, endometrial  biopsies sent to pathology.   ESTIMATED BLOOD LOSS:  Minimal.   COMPLICATIONS:  None.   FLUID DEFICIT:  110 mL of 3% sorbitol.   DISPOSITION:  The patient went to PACU in stable condition.   DESCRIPTION OF PROCEDURE:  The patient was taken to the operating room where  she was given general anesthesia, placed in the dorsal lithotomy position,  prepped and draped in the normal sterile fashion.  The patient was given  Cytotec four hours before the procedure.  The patient was noted to be  anteverted with no adnexal masses on exam.  A bivalve speculum was placed  into the vagina.  A single-tooth tenaculum was placed on the anterior lip of  the cervix.  There was a small polyp. This small polyp in the cervix was  removed and sent to pathology.  The cervix appeared dilated.  When I tried  to sound it, it was not so I did cervical dilation and then I was able to  put the sound in to 6.5 cm.  Cervix was dilated with Pratt dilators to 21.  Hysteroscope was placed in the cervix but because of the angle, I could not  get the hysteroscope into the canal.  I then put the tenaculum on the  posterior side of the cervix and further dilated up to 25, but  still was  unable to see inside the uterus.  I then tried to dilate again and still was  unable to do it.  I then obtained endocervical curettings and did obtain  some endometrial curettings.  Because of the angle of the cerival uterine  junction I was only able to use a small curett.  I just did an endometrial  biopsy with three passes with the endometrial Pipelle and sent that to  pathology.  I then tried to hook up the hysteroscope again and dilated with  25 again and still was unable to look into the uterine cavity.  At this  point in order to avoid injury or perforation, procedure was stopped.  All  instruments were removed.  Tenaculum removed.  Vagina with good hemostasis.  There was 110 mL of deficit of 3% sorbitol.  The patient went to the  recovery room in stable condition.   Before the procedure, the patient said that she was interested in hormone  replacement therapy for  hot flashes with health initiative with increased  risk of breast cancer and DVT and PE for review with the patient in detail,  especially with her history of smoking.  The patient understood the risks  and still wanted to proceed.  She was prescribed Femhrt 2.5 and 0.5. She  will follow up with me in two weeks.      Naima A. Normand Sloop, M.D.  Electronically Signed     NAD/MEDQ  D:  06/09/2006  T:  06/09/2006  Job:  [id number]

## 2010-12-17 NOTE — H&P (Signed)
[name], [name]               ACCOUNT NO.:  [id number]   MEDICAL RECORD NO.:  [id number]          PATIENT TYPE:  AMB   LOCATION:  SDC                           FACILITY:  [facility]   PHYSICIAN:  Naima A. Dillard, M.D. DATE OF BIRTH:  October 15, 1960   DATE OF ADMISSION:  DATE OF DISCHARGE:                                HISTORY & PHYSICAL   CHIEF COMPLAINT:  Postmenopausal vaginal bleeding.  The patient is a 50-year-  old Caucasian female, gravida 1, para 1, whose last normal menstrual period  was in 2001.  She presented to me in August of 2007 stating that she had  some vaginal bleeding twice.  Each episode lasted about three days.  She  denied being on any blood thinners or having any bleeding disorders.  The  patient denied having any fibroids.  She has not been on any contraception  or hormone therapy.  She does have mood swings, hot flashes, and vaginal  dryness.  The patient denies having any vaginal discharge, abdominal pain,  or increased stress.  She just started a new medication and when she first  saw me she started Celestone about one week ago.   PAST MEDICAL HISTORY:  1. Memory loss.  2. Panic attacks.  3. Depression.   FAMILY HISTORY:  Significant for mother with diabetes.  No GYN CA.   PAST GYN HISTORY:  The patient reports that she has a history of cervical  cancer, had a cone biopsy.   SOCIAL HISTORY:  She smokes a pack a day for 20 years, denies any alcohol or  illicit drug use.   ALLERGIES:  AMBIEN.   PAST SURGICAL HISTORY:  1. Tonsils.  2. Cone biopsy.   REVIEW OF SYSTEMS:  CARDIOVASCULAR:  No complications.  PSYCHIATRIC:  Significant for panic disorder and depression.  GASTROINTESTINAL:  No  irritable bowel.  MUSCULOSKELETAL:  No weakness.  NEURO:  Significant for  memory loss.  ENDOCRINE:  Unremarkable.   PHYSICAL EXAMINATION:  VITAL SIGNS:  The patient weighs 129 pounds, blood  pressure is 112/62.  HEENT:  Pupils are equal.  Hearing is normal.  Throat  is clear.  NECK:  Thyroid is not enlarged.  HEART:  Has regular rate and rhythm.  CHEST:  Clear to auscultation bilaterally.  BREASTS:  Have no masses, discharge, bilaterally.  BACK:  No CVA tenderness.  ABDOMEN:  Nontender without any masses or organomegaly.  EXTREMITIES:  Have no cyanosis, clubbing, or edema.  NEUROLOGIC:  Within normal limits.  PELVIC:  vulva and vagina within normal limits.  Cervix is nontender without  any lesions.  Uterus is normal shape, size, and consistency and nontender.  Adnexa has no masses.   PERTINENT LABORATORY DATA:  fsh 62.1.  She did actually have a small  cervical polyp but her cervix was biopsied and looked to be normal.  On  ultrasound on April 25, 2006 her uterus measured 6.08 x 2.44 x 4.21 with  normal-appearing ovaries.  The patient had a normal Pap smear on March 28, 2006 and GC and Chlamydia were negative.   ASSESSMENT:  Postmenopausal  vaginal bleeding.   PLAN:  Tried to attempt to do an endometrial biopsy but the patient did not  tolerate the dilation of the cervix.  She decided to proceed with a  D&C/hysteroscopy.  She understands the risk of bleeding, infection,  perforation of the uterus, damage to internal organs.  Her daughter was  present at the time of preoperative visit.      Naima A. Normand Sloop, M.D.  Electronically Signed     NAD/MEDQ  D:  06/08/2006  T:  06/09/2006  Job:  [id number]

## 2010-12-17 NOTE — Discharge Summary (Signed)
NAMEBRENA, [name] NO.:  [id number]   MEDICAL RECORD NO.:  [id number]          PATIENT TYPE:  INP   LOCATION:  5123                         FACILITY:  [facility]   PHYSICIAN:  Eliseo Gum, M.D.   DATE OF BIRTH:  [birth year]   DATE OF ADMISSION:  01/15/2008  DATE OF DISCHARGE:  01/19/2008                               DISCHARGE SUMMARY   CONTINUITY DOCTOR:  Hartley Barefoot, MD   CONSULTANTS:  Wilhemina Bonito. Marina Goodell, MD, gastroenterologist/Carl Sena Slate, MD,  [facility]   DISCHARGE DIAGNOSES:  1. Chest pain of unclear etiology.  2. Duodenitis.  3. History of chronic constipation.  4. History of depression.  5. History of cervical cancer.  6. Chronic obstructive pulmonary disease.  7. Substance abuse.  8. Tobacco abuse.   DISCHARGE MEDICATIONS:  1. Nexium 40 mg by mouth twice a day.  2. Alprazolam 1 mg t.i.d. as needed for anxiety.  3. Combivent 120/21 two puffs inhale daily as needed.  4. Amoxicillin 500 mg by mouth twice a day.  5. MiraLax 1 p.o. daily for constipation as needed.  6. Percocet 5/325 one p.o. every 6 hours for pain as needed.   DISPOSITION AND FOLLOW UP:  The Outpatient Clinic will call her to  schedule an appointment.  She was discharged over the weekend. The  resolution or improvement of her chest pain need to be follow up.   PROCEDURES PERFORMED:  1. CT pelvis.  No acute pelvic CT finding.  2. CT abdomen.  Pancreas is normal.  The adrenal glands are normal.      The spleen is normal.  Kidneys are normal.  No free fluid or      abnormal fluid collection.  The liver has small hypodensity area.  3. Abdominal ultrasound with no cholelithiasis, bile duct normal in      caliber.Two small hyperechoic focal areas are seen within the      liver.  4. Endoscopy which show duodenitis.   BRIEF HISTORY OF PRESENT ILLNESS:  Ms. Gorgas is a 50 year old woman  with past medical history significant for substance abuse.  She has been  clean for the last 14  years.  She has a history of duodenal ulcer as per  the patient.  She also has a history of COPD.  She is presently  complaining of epigastric pain radiating to the chest and to her back.  She relates the pain is burning in quality 9/10, then was 7/10  intermittent last 3-4 hours, worse with food.  She has been taking  amoxicillin and ibuprofen for the last 3 days after a tooth abscess.  She also relates nausea but denies vomiting or any change in the color  of her stool.  She relates some odynophagia for the last month.   PHYSICAL EXAMINATION:  VITAL SIGNS:  Temperature 97.2, blood pressure  130/68, pulse 59, O2 saturation 99% on room air, and respirations 20.  GENERAL:  She was in no acute distress.  RESPIRATORY:  Clear breath sounds.  CARDIOVASCULAR:  S1 and S2 normal.  Regular rhythm and rate.  GASTROINTESTINAL:  Bowel sounds  are positive and hyperactive.  Mild  epigastric tenderness.  No guarding.  No rigidity.  No Murphy sign.  EXTREMITIES:  Pulse positive.  No edema.   LABORATORY DATA:  Sodium 140, potassium 3.9, chloride 103, bicarb 27,  BUN 15, creatinine 0.84, and glucose 88.  White blood cell of 7.1,  hemoglobin 14.8, hematocrit 42.8, and platelets 380.  ANC 4.4 and MCV  94.  She has a lipase of 157.  UA was negative.  Chest x-ray was normal.  Anion gap 0.9, bilirubin 0.9, alkaline phosphatase 89, AST 26, ALT 12,  protein 7.8, albumin 4.3, and calcium 9.8.   HOSPITAL COURSE:  1. Epigastric/chest pain.  We admitted her to the regular floor.  She      was n.p.o.  CT abdomen was ordered to check for pancreatitis.  Her      lipase was slightly elevated at 154. We also order a lipid profile      to check for hypertriglyceridemia, it was normal, 100 triglyceride.      and calcium was also normal at 9.1.  UDS was positive for opiates      and benzodiazepines.  We were thinking that she might have some mild pancreatitis, but her  symptoms did not improve with fluid, pain  medication, and bowel rest.  So, we consulted GI for an endoscopy because she was complaining of  odynophagia also.  An endoscopy was performed and it was negative for  esophagitis, negative for peptic ulcer disease, or gastritis.  She has  had a mild duodenitis, but this was not consistent with her symptoms of  chest  and epigastric pain.  During hospitalization, her chest pain  improved and later we continued with Protonix twice a day.  She was also  started on regular diet and she was able to tolerate it.  We do not have  a clear reason for her chest pain and may be it was anxiety related.  Cardiac enzimes times 3 negative and EKG was normal. unlikely  pericarditis no difuse ST elevation, no relieved by sitting foward.   1. COPD.  During hospitalization, she was in no distress.  Her COPD      was controlled.  We will continue with Combivent.   1. She has a history of constipation.  She was supposed to have a      colonoscopy, but this could not be performed because of poor bowel      preparation at that time. She will need a colonoscopy outpatien.      We started Fleet Enema and these have helped her.   On the day of discharge, Ms. Schubach was tolerating fluids.  Her chest  pain was a little bit better.  She was still complaining of this, but  she wanted to go home also on pain medication.  Her vitals were stable.  Her temperature 98.5, blood pressure 129/90, pulse 83, respirations 20,  and saturation 98% on room air.  White blood cell 5.0,  hemoglobin 12.7, hematocrit 37.2, and platelets 244.  Sodium 140,  potassium 3.9, chloride 102, bicarb 29, BUN 2, and creatinine 0.87.  HIV  was nonreactive.  Repeated lipase was 14.   She was discharged in a stable condition.      Hartley Barefoot, MD  Electronically Signed      Eliseo Gum, M.D.  Electronically Signed    BR/MEDQ  D:  01/21/2008  T:  01/22/2008  Job:  [id number]

## 2010-12-25 ENCOUNTER — Other Ambulatory Visit: Payer: Self-pay | Admitting: Internal Medicine

## 2010-12-30 ENCOUNTER — Telehealth: Payer: Self-pay | Admitting: *Deleted

## 2011-01-03 ENCOUNTER — Other Ambulatory Visit: Payer: Self-pay | Admitting: *Deleted

## 2011-01-03 NOTE — Telephone Encounter (Signed)
Pt states it is due Sunday 6/10; wants to pick up rx Friday.  Thanks

## 2011-01-04 NOTE — Telephone Encounter (Signed)
I will print and sign script this am

## 2011-01-07 MED ORDER — OXYMORPHONE HCL 10 MG PO TABS: 10.0000 mg | ORAL_TABLET | Freq: Four times a day (QID) | ORAL | Status: DC | PRN

## 2011-01-07 NOTE — Telephone Encounter (Signed)
Tried to call pt, vmail had not been setup, unable to leave message or speak w/ her

## 2011-01-31 ENCOUNTER — Other Ambulatory Visit: Payer: Self-pay | Admitting: Internal Medicine

## 2011-02-07 ENCOUNTER — Other Ambulatory Visit: Payer: Self-pay | Admitting: *Deleted

## 2011-02-07 MED ORDER — ESOMEPRAZOLE MAGNESIUM 40 MG PO CPDR: 40.0000 mg | DELAYED_RELEASE_CAPSULE | Freq: Every day | ORAL | Status: DC

## 2011-02-07 MED ORDER — OXYMORPHONE HCL 10 MG PO TABS: 10.0000 mg | ORAL_TABLET | Freq: Four times a day (QID) | ORAL | Status: DC | PRN

## 2011-02-07 NOTE — Progress Notes (Signed)
  Subjective:    Patient ID: [name], female    DOB: 1960/08/03, 50 y.o.   MRN: [id number]  HPI    Review of Systems     Objective:   Physical Exam        Assessment & Plan:

## 2011-02-07 NOTE — Telephone Encounter (Signed)
Pt # [id number] Pt is out of meds. Lat opana 01/03/11

## 2011-02-07 NOTE — Telephone Encounter (Signed)
Pt informed Rx is ready 

## 2011-02-17 ENCOUNTER — Telehealth: Payer: Self-pay | Admitting: *Deleted

## 2011-02-17 NOTE — Telephone Encounter (Signed)
Pt called states she has had a hard time getting in to see Dr Phillips Odor. This pt refuses to see anyone else. She request that Dr Phillips Odor call her at [phone] any time to talk about  The anxiety she is having.  I told pt I would send this to Dr Phillips Odor but to please call back anytime and I  Will set up appointment with clinic doctor.  Pt voices understanding.

## 2011-02-23 NOTE — Telephone Encounter (Signed)
Schedule her in my next clinic

## 2011-02-24 ENCOUNTER — Encounter: Payer: Self-pay | Admitting: Internal Medicine

## 2011-02-24 ENCOUNTER — Ambulatory Visit (INDEPENDENT_AMBULATORY_CARE_PROVIDER_SITE_OTHER): Payer: Medicare Other | Admitting: Internal Medicine

## 2011-02-24 VITALS — BP 114/79 | HR 110 | Temp 97.0°F | Ht 67.0 in | Wt 109.4 lb

## 2011-02-24 DIAGNOSIS — M542 Cervicalgia: Secondary | ICD-10-CM

## 2011-02-24 DIAGNOSIS — G8929 Other chronic pain: Secondary | ICD-10-CM

## 2011-02-24 DIAGNOSIS — E0590 Thyrotoxicosis, unspecified without thyrotoxic crisis or storm: Secondary | ICD-10-CM

## 2011-02-24 DIAGNOSIS — J449 Chronic obstructive pulmonary disease, unspecified: Secondary | ICD-10-CM

## 2011-02-24 DIAGNOSIS — F329 Major depressive disorder, single episode, unspecified: Secondary | ICD-10-CM

## 2011-02-24 DIAGNOSIS — E538 Deficiency of other specified B group vitamins: Secondary | ICD-10-CM

## 2011-02-24 LAB — CBC WITH DIFFERENTIAL/PLATELET
Basophils Relative: 0 % (ref 0–1)
Eosinophils Absolute: 0.2 10*3/uL (ref 0.0–0.7)
Eosinophils Relative: 3 % (ref 0–5)
Hemoglobin: 16.4 g/dL — ABNORMAL HIGH (ref 12.0–15.0)
Lymphs Abs: 2.1 10*3/uL (ref 0.7–4.0)
MCH: 33.3 pg (ref 26.0–34.0)
MCHC: 33.5 g/dL (ref 30.0–36.0)
MCV: 99.6 fL (ref 78.0–100.0)
Monocytes Relative: 8 % (ref 3–12)
Platelets: 275 10*3/uL (ref 150–400)
RBC: 4.92 MIL/uL (ref 3.87–5.11)

## 2011-02-24 LAB — COMPREHENSIVE METABOLIC PANEL
AST: 14 U/L (ref 0–37)
Albumin: 4.9 g/dL (ref 3.5–5.2)
Alkaline Phosphatase: 82 U/L (ref 39–117)
Glucose, Bld: 81 mg/dL (ref 70–99)
Potassium: 4.1 mEq/L (ref 3.5–5.3)
Sodium: 138 mEq/L (ref 135–145)
Total Bilirubin: 0.6 mg/dL (ref 0.3–1.2)
Total Protein: 8.6 g/dL — ABNORMAL HIGH (ref 6.0–8.3)

## 2011-02-24 MED ORDER — FENTANYL 100 MCG/HR TD PT72: 1.0000 | MEDICATED_PATCH | TRANSDERMAL | Status: DC

## 2011-02-24 MED ORDER — OXYMORPHONE HCL 10 MG PO TABS: 10.0000 mg | ORAL_TABLET | ORAL | Status: DC | PRN

## 2011-02-24 MED ORDER — KETOROLAC TROMETHAMINE 30 MG/ML IJ SOLN
30.0000 mg | Freq: Once | INTRAMUSCULAR | Status: AC
  Administered 2011-02-24: 30 mg via INTRAMUSCULAR

## 2011-02-24 MED ORDER — AZITHROMYCIN 250 MG PO TABS: 250.0000 mg | ORAL_TABLET | Freq: Every day | ORAL | Status: DC

## 2011-02-24 MED ORDER — ALPRAZOLAM 1 MG PO TABS: 1.0000 mg | ORAL_TABLET | Freq: Three times a day (TID) | ORAL | Status: DC | PRN

## 2011-02-24 MED ORDER — KETOROLAC TROMETHAMINE 30 MG/ML IJ SOLN: 30.0000 mg | Freq: Once | INTRAMUSCULAR | Status: DC

## 2011-02-24 MED ORDER — ARIPIPRAZOLE 15 MG PO TABS: 15.0000 mg | ORAL_TABLET | Freq: Every day | ORAL | Status: DC

## 2011-02-24 MED ORDER — OXYMORPHONE HCL 10 MG PO TABS: 10.0000 mg | ORAL_TABLET | Freq: Four times a day (QID) | ORAL | Status: DC | PRN

## 2011-02-24 MED ORDER — CYANOCOBALAMIN 1000 MCG/ML IJ SOLN
1000.0000 ug | INTRAMUSCULAR | Status: DC
  Administered 2011-02-24: 1000 ug via INTRAMUSCULAR

## 2011-02-24 NOTE — Progress Notes (Signed)
  Subjective:    Patient ID: [name], female    DOB: 11-07-1960, 50 y.o.   MRN: [id number]  HPI    Review of Systems     Objective:   Physical Exam        Assessment & Plan:

## 2011-03-10 ENCOUNTER — Ambulatory Visit: Payer: Medicare Other | Admitting: Internal Medicine

## 2011-03-11 ENCOUNTER — Ambulatory Visit (INDEPENDENT_AMBULATORY_CARE_PROVIDER_SITE_OTHER): Payer: Medicare Other | Admitting: Internal Medicine

## 2011-03-11 ENCOUNTER — Encounter: Payer: Self-pay | Admitting: Internal Medicine

## 2011-03-11 DIAGNOSIS — M542 Cervicalgia: Secondary | ICD-10-CM

## 2011-03-11 DIAGNOSIS — F329 Major depressive disorder, single episode, unspecified: Secondary | ICD-10-CM

## 2011-03-11 DIAGNOSIS — J449 Chronic obstructive pulmonary disease, unspecified: Secondary | ICD-10-CM

## 2011-03-11 NOTE — Assessment & Plan Note (Signed)
Will check a TSH.

## 2011-03-11 NOTE — Assessment & Plan Note (Signed)
Worsening depression. She has been resistant to starting SSRI so I have offered Abilify today as an alternative. Will need to bring her back in 2-3 weeks we discussed discontinuation and immediate help if she develops any suicidal ideation while taking the new medication. Depression is a major player in her pain and coping with chronic disease.

## 2011-03-11 NOTE — Progress Notes (Signed)
  Subjective:    Patient ID: [name], female    DOB: Dec 31, 1960, 50 y.o.   MRN: [id number]  HPI  The patient comes in today for followup and medication adjustments. Had major problems with the Abilify-took for three days. Had dizziness black outs and low motivation. Did not tolerate well. Pain slightly increased today but she relates it to the weather and coldness. Chronic pain as an ongoing problem. Pain is made worse by depression. She has been refractory to SSRIs and other treatments. Pain consists of multiple trigger points tender points and areas of joint involvement. She's been on chronic opiate therapy for several years. I have not recently escalated her dose. She's also have worsening fatigue and shortness of breath with activity. Has knon baseline COPD and continues to smoke. Recurrent upper respiratory infections and chronic bronchitis major feature. Associated weight loss-likely multifactorial.  Review of Systems Per HPI    Objective:   Physical Exam  Nursing note and vitals reviewed. Constitutional: She is oriented to person, place, and time. She appears well-developed. No distress.       Thin, dark sun-damaged skin, tobacco stains   HENT:  Head: Normocephalic and atraumatic.       Deep course voice, no overt horeseness  Neck:       Chronic calcified right SM lymph node. Non-tender.  Cardiovascular: Normal rate, regular rhythm and normal heart sounds.   Pulmonary/Chest: Effort normal. She has wheezes.       Thin, barrel chest  Abdominal: Soft. Bowel sounds are normal. She exhibits no distension. There is no tenderness.  Musculoskeletal: She exhibits no edema and no tenderness.       Multiple tenderpoints and trigger points, scapulothoracic joints, trapezius and paraspinals.  Neurological: She is alert and oriented to person, place, and time.  Skin: Skin is warm and dry. No rash noted. No erythema.  Psychiatric: She has a normal mood and affect. Her behavior is normal.            Assessment & Plan:

## 2011-03-11 NOTE — Assessment & Plan Note (Addendum)
Her COPD is clearly progressing probably contributing to fatigue and weight loss as well. She keeps a chronic cough and have frequent low level exacerbations. I will do a trial of once daily azithromycin and see if this improves things. We can consider repeat PFTs at next viosit. Continue spiriva and symbicort

## 2011-03-11 NOTE — Assessment & Plan Note (Signed)
IM today 

## 2011-03-11 NOTE — Assessment & Plan Note (Signed)
Will continue current pain regimen. Her functional status is stable. No red flag behavior.

## 2011-03-15 ENCOUNTER — Encounter: Payer: Self-pay | Admitting: Internal Medicine

## 2011-03-15 NOTE — Assessment & Plan Note (Addendum)
COPD is clearly playing a major role in her illness. I think this can attribute to her fatigue her reduced functional status and overall poor health. I reiterated today that she must consider smoking cessation is being her top priority. I think we need to consider getting overnight O2 sats as per and always appear somewhat hypoxic to me although on her SpO2 testing this is not indicated. Her skin is usually very dark and gray. She has reduced capillary refill. No anemia. I will try to obtain her last pulmonary function tests and we can reconsider repeating these as an indication of her disease progression. She is currently on both Spiriva and symbicort. I again discussed today appropriate use of her inhalers. Place a referral for advanced care to do a respiratory overnight O2 sat monitoring and consider for home O2. I have elected to keep her on daily azithromycin for chronic bronchitis recurrence.

## 2011-03-15 NOTE — Assessment & Plan Note (Signed)
Did not tolerate Abilify we'll stop this medication. She has not been able to tolerate any trial of SSRI it is associated with mood swings and worsening of her depression as well as suicidal ideation. I have offered counseling and nonpharmacologic options to her in the past I reoffered these today and she is going to consider this.
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
## 2011-03-15 NOTE — Progress Notes (Signed)
  Subjective:    Patient ID: [name], female    DOB: 11/27/1960, 50 y.o.   MRN: [id number]  HPI    Review of Systems     Objective:   Physical Exam        Assessment & Plan:   

## 2011-03-18 ENCOUNTER — Other Ambulatory Visit: Payer: Self-pay | Admitting: Internal Medicine

## 2011-03-18 ENCOUNTER — Telehealth: Payer: Self-pay | Admitting: *Deleted

## 2011-03-18 DIAGNOSIS — J449 Chronic obstructive pulmonary disease, unspecified: Secondary | ICD-10-CM
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
## 2011-03-18 NOTE — Assessment & Plan Note (Signed)
Order put in on prescription for [facility] for overnight 02 saturation monitoring in consideration for home 02.

## 2011-03-18 NOTE — Telephone Encounter (Signed)
I agree

## 2011-03-21 ENCOUNTER — Telehealth: Payer: Self-pay | Admitting: *Deleted

## 2011-03-21 NOTE — Telephone Encounter (Signed)
Pt calls and can barely talk, very hoarse, also states she has not been seen for the fall yet, will see ortho in am, she was offered 3 appts this week in clinic but refuses, states if she changes her mind tomorrow she will call back

## 2011-03-24 ENCOUNTER — Other Ambulatory Visit: Payer: Self-pay | Admitting: Specialist

## 2011-03-24 DIAGNOSIS — R531 Weakness: Secondary | ICD-10-CM

## 2011-03-24 DIAGNOSIS — M545 Low back pain: Secondary | ICD-10-CM

## 2011-03-26 ENCOUNTER — Ambulatory Visit
Admission: RE | Admit: 2011-03-26 | Discharge: 2011-03-26 | Disposition: A | Discharge status: Home / Self Care | Payer: Medicare Other | Source: Ambulatory Visit | Attending: Specialist | Admitting: Specialist

## 2011-03-26 DIAGNOSIS — R531 Weakness: Secondary | ICD-10-CM

## 2011-03-26 DIAGNOSIS — M545 Low back pain: Secondary | ICD-10-CM

## 2011-03-31 ENCOUNTER — Other Ambulatory Visit: Payer: Self-pay | Admitting: Specialist

## 2011-03-31 DIAGNOSIS — M5126 Other intervertebral disc displacement, lumbar region: Secondary | ICD-10-CM

## 2011-04-01 ENCOUNTER — Ambulatory Visit
Admission: RE | Admit: 2011-04-01 | Discharge: 2011-04-01 | Disposition: A | Discharge status: Home / Self Care | Payer: Medicare Other | Source: Ambulatory Visit | Attending: Specialist | Admitting: Specialist

## 2011-04-01 DIAGNOSIS — M5126 Other intervertebral disc displacement, lumbar region: Secondary | ICD-10-CM

## 2011-04-01 MED ORDER — IOHEXOL 180 MG/ML  SOLN
1.0000 mL | Freq: Once | INTRAMUSCULAR | Status: AC | PRN
  Administered 2011-04-01: 1 mL via EPIDURAL

## 2011-04-01 MED ORDER — METHYLPREDNISOLONE ACETATE 40 MG/ML INJ SUSP (RADIOLOG
120.0000 mg | Freq: Once | INTRAMUSCULAR | Status: AC
  Administered 2011-04-01: 120 mg via EPIDURAL

## 2011-04-14 ENCOUNTER — Other Ambulatory Visit: Payer: Self-pay | Admitting: Specialist

## 2011-04-14 DIAGNOSIS — M549 Dorsalgia, unspecified: Secondary | ICD-10-CM

## 2011-04-15 ENCOUNTER — Ambulatory Visit: Admission: RE | Admit: 2011-04-15 | Payer: Medicare Other | Source: Ambulatory Visit

## 2011-04-15 ENCOUNTER — Other Ambulatory Visit: Payer: Self-pay | Admitting: Specialist

## 2011-04-15 ENCOUNTER — Ambulatory Visit
Admission: RE | Admit: 2011-04-15 | Discharge: 2011-04-15 | Disposition: A | Discharge status: Home / Self Care | Payer: Medicare Other | Source: Ambulatory Visit | Attending: Specialist | Admitting: Specialist

## 2011-04-15 DIAGNOSIS — M549 Dorsalgia, unspecified: Secondary | ICD-10-CM

## 2011-04-15 MED ORDER — IOHEXOL 180 MG/ML  SOLN
1.0000 mL | Freq: Once | INTRAMUSCULAR | Status: AC | PRN
  Administered 2011-04-15: 1 mL via EPIDURAL

## 2011-04-15 MED ORDER — METHYLPREDNISOLONE ACETATE 40 MG/ML INJ SUSP (RADIOLOG
120.0000 mg | Freq: Once | INTRAMUSCULAR | Status: AC
  Administered 2011-04-15: 120 mg via EPIDURAL

## 2011-04-21 ENCOUNTER — Ambulatory Visit (INDEPENDENT_AMBULATORY_CARE_PROVIDER_SITE_OTHER): Payer: Medicare Other | Admitting: Internal Medicine

## 2011-04-21 ENCOUNTER — Encounter: Payer: Self-pay | Admitting: Internal Medicine

## 2011-04-21 VITALS — BP 107/68 | HR 69 | Temp 98.4°F | Ht 67.0 in | Wt 108.9 lb

## 2011-04-21 DIAGNOSIS — M5106 Intervertebral disc disorders with myelopathy, lumbar region: Secondary | ICD-10-CM

## 2011-04-21 DIAGNOSIS — G8929 Other chronic pain: Secondary | ICD-10-CM

## 2011-04-21 DIAGNOSIS — J449 Chronic obstructive pulmonary disease, unspecified: Secondary | ICD-10-CM

## 2011-04-21 MED ORDER — OXYMORPHONE HCL 10 MG PO TABS: 10.0000 mg | ORAL_TABLET | ORAL | Status: DC | PRN

## 2011-04-21 MED ORDER — FENTANYL 100 MCG/HR TD PT72: 1.0000 | MEDICATED_PATCH | TRANSDERMAL | Status: DC

## 2011-04-21 MED ORDER — ALPRAZOLAM 1 MG PO TABS: 2.0000 mg | ORAL_TABLET | Freq: Three times a day (TID) | ORAL | Status: DC | PRN

## 2011-04-21 MED ORDER — ALPRAZOLAM 2 MG PO TABS: 2.0000 mg | ORAL_TABLET | Freq: Three times a day (TID) | ORAL | Status: DC | PRN

## 2011-04-28 LAB — COMPREHENSIVE METABOLIC PANEL
ALT: 11
ALT: 12
AST: 14
AST: 18
AST: 20
AST: 26
Albumin: 3.3 — ABNORMAL LOW
Albumin: 4.3
Alkaline Phosphatase: 67
BUN: 11
CO2: 27
CO2: 27
CO2: 28
Calcium: 9.1
Calcium: 9.2
Chloride: 103
Chloride: 105
Chloride: 106
Creatinine, Ser: 0.78
Creatinine, Ser: 0.81
GFR calc Af Amer: >60
GFR calc Af Amer: >60
GFR calc Af Amer: >60
GFR calc Af Amer: >60
GFR calc non Af Amer: >60
GFR calc non Af Amer: >60
GFR calc non Af Amer: >60
GFR calc non Af Amer: >60
Glucose, Bld: 104 — ABNORMAL HIGH
Potassium: 3.9
Sodium: 139
Sodium: 140
Total Bilirubin: 0.6
Total Bilirubin: 0.6
Total Bilirubin: 0.9

## 2011-04-28 LAB — APTT: aPTT: 28

## 2011-04-28 LAB — CBC
HCT: 35.4 — ABNORMAL LOW
HCT: 37.2
Hemoglobin: 12.5
Hemoglobin: 12.7
MCHC: 35
MCHC: 35
MCV: 93.9
MCV: 95
Platelets: 246
Platelets: 380
RBC: 3.93
RBC: 3.96
RBC: 4.52
RDW: 12.8
RDW: 12.9
WBC: 5
WBC: 5.4
WBC: 7.1

## 2011-04-28 LAB — LIPID PANEL
HDL: 43
LDL Cholesterol: 117 — ABNORMAL HIGH
Triglycerides: 100

## 2011-04-28 LAB — LACTATE DEHYDROGENASE: LDH: 122

## 2011-04-28 LAB — POCT CARDIAC MARKERS
CKMB, poc: <1 — ABNORMAL LOW
Troponin i, poc: <0.05

## 2011-04-28 LAB — BASIC METABOLIC PANEL
GFR calc non Af Amer: >60
Potassium: 3.9
Sodium: 140

## 2011-04-28 LAB — DIFFERENTIAL
Basophils Absolute: 0
Basophils Absolute: 0
Basophils Absolute: 0.1
Basophils Relative: 1
Eosinophils Absolute: 0.1
Eosinophils Absolute: 0.2
Eosinophils Relative: 2
Eosinophils Relative: 3
Eosinophils Relative: 3
Lymphocytes Relative: 55 — ABNORMAL HIGH
Lymphs Abs: 2.1
Lymphs Abs: 2.3
Lymphs Abs: 2.5
Monocytes Absolute: 0.4
Monocytes Absolute: 0.5
Neutro Abs: 1.5 — ABNORMAL LOW

## 2011-04-28 LAB — URINALYSIS, ROUTINE W REFLEX MICROSCOPIC
Bilirubin Urine: NEGATIVE
Nitrite: NEGATIVE
Specific Gravity, Urine: 1.024
Urobilinogen, UA: 1
pH: 5.5

## 2011-04-28 LAB — LIPASE, BLOOD
Lipase: 14
Lipase: 157 — ABNORMAL HIGH

## 2011-04-28 LAB — TROPONIN I: Troponin I: <0.01

## 2011-04-28 LAB — RAPID URINE DRUG SCREEN, HOSP PERFORMED
Amphetamines: NOT DETECTED
Opiates: POSITIVE — AB
Tetrahydrocannabinol: POSITIVE — AB

## 2011-04-28 LAB — HIV ANTIBODY (ROUTINE TESTING W REFLEX): HIV: NONREACTIVE

## 2011-04-28 LAB — URINE MICROSCOPIC-ADD ON

## 2011-04-28 LAB — CARDIAC PANEL(CRET KIN+CKTOT+MB+TROPI)
Relative Index: INVALID
Relative Index: INVALID
Total CK: 93
Troponin I: <0.01

## 2011-04-28 LAB — ETHANOL: Alcohol, Ethyl (B): <5

## 2011-04-28 LAB — CK TOTAL AND CKMB (NOT AT ARMC)
CK, MB: 0.9
Relative Index: 0.8
Total CK: 109

## 2011-04-28 LAB — PROTIME-INR: Prothrombin Time: 12.3

## 2011-04-30 ENCOUNTER — Other Ambulatory Visit: Payer: Self-pay | Admitting: Internal Medicine

## 2011-05-02 ENCOUNTER — Other Ambulatory Visit: Payer: Self-pay | Admitting: *Deleted

## 2011-05-02 MED ORDER — VALACYCLOVIR HCL 1 G PO TABS: 1000.0000 mg | ORAL_TABLET | Freq: Two times a day (BID) | ORAL | Status: DC

## 2011-05-02 NOTE — Telephone Encounter (Signed)
Also would like zovirax cream

## 2011-05-06 MED ORDER — ACYCLOVIR 5 % EX OINT: TOPICAL_OINTMENT | CUTANEOUS | Status: DC

## 2011-05-18 LAB — I-STAT 8, (EC8 V) (CONVERTED LAB)
Bicarbonate: 23.4
HCT: 40
Hemoglobin: 13.6
Operator id: 272551
Sodium: 139
TCO2: 24
pCO2, Ven: 34.2 — ABNORMAL LOW

## 2011-05-18 LAB — CBC
HCT: 36.2
MCHC: 33.9
MCV: 94.2
RBC: 3.84 — ABNORMAL LOW
WBC: 5.9

## 2011-05-18 LAB — POCT CARDIAC MARKERS
CKMB, poc: <1 — ABNORMAL LOW
Troponin i, poc: <0.05

## 2011-05-18 LAB — POCT I-STAT CREATININE: Creatinine, Ser: 0.8

## 2011-07-21 ENCOUNTER — Encounter: Payer: Self-pay | Admitting: Internal Medicine

## 2011-07-21 ENCOUNTER — Ambulatory Visit (INDEPENDENT_AMBULATORY_CARE_PROVIDER_SITE_OTHER): Payer: Medicare Other | Admitting: Internal Medicine

## 2011-07-21 VITALS — BP 108/94 | HR 80 | Temp 97.3°F | Ht 67.0 in | Wt 116.6 lb

## 2011-07-21 DIAGNOSIS — G8929 Other chronic pain: Secondary | ICD-10-CM

## 2011-07-21 MED ORDER — OXYMORPHONE HCL 10 MG PO TABS: 10.0000 mg | ORAL_TABLET | ORAL | Status: DC | PRN

## 2011-07-21 MED ORDER — FENTANYL 100 MCG/HR TD PT72: 1.0000 | MEDICATED_PATCH | TRANSDERMAL | Status: DC

## 2011-07-21 MED ORDER — ALPRAZOLAM 2 MG PO TABS: 2.0000 mg | ORAL_TABLET | Freq: Three times a day (TID) | ORAL | Status: DC | PRN

## 2011-07-21 MED ORDER — TAPENTADOL HCL 50 MG PO TABS: 50.0000 mg | ORAL_TABLET | Freq: Four times a day (QID) | ORAL | Status: DC | PRN

## 2011-07-28 ENCOUNTER — Other Ambulatory Visit: Payer: Self-pay | Admitting: Internal Medicine

## 2011-08-17 ENCOUNTER — Other Ambulatory Visit: Payer: Self-pay | Admitting: Internal Medicine

## 2011-08-19 ENCOUNTER — Encounter: Payer: Self-pay | Admitting: Internal Medicine

## 2011-08-19 NOTE — Progress Notes (Signed)
[name] comes in for routine pain management appointment and chronic disease management. Current level of pain control is adequate. She has a new lumbar disc hernation last month and has been getting epidual injections, she has a new foot drop, requiring a splint. No new complaints. She continues to smoke and has had increasing dyspnea, limiting her functional status. Has been contemplating quitting smoking.

## 2011-08-19 NOTE — Assessment & Plan Note (Signed)
Still needs home eval for O2, COPD progressing. Requested last PFTs for review.

## 2011-09-19 ENCOUNTER — Encounter: Payer: Self-pay | Admitting: *Deleted

## 2011-09-19 ENCOUNTER — Other Ambulatory Visit: Payer: Medicare Other

## 2011-09-19 ENCOUNTER — Other Ambulatory Visit: Payer: Self-pay | Admitting: Internal Medicine

## 2011-09-19 DIAGNOSIS — F329 Major depressive disorder, single episode, unspecified: Secondary | ICD-10-CM

## 2011-09-19 MED ORDER — CLONAZEPAM 1 MG PO TABS: 1.0000 mg | ORAL_TABLET | Freq: Two times a day (BID) | ORAL | Status: DC | PRN

## 2011-09-19 NOTE — Progress Notes (Signed)
[name] came to me regarding medicaid no longer paying for benzos. Medicare will pay for three benzos - none of which is Xanax. Since [name] on for long time, will change to clonazepam 1 mg BID. [name] will instruct [name] not to expect the immediate rush like with Xanax. [name] also on Duragesic. Will check UDS. [name] also on Megace with a h/o cancer - high VTE risk. [name] has appt with Dr Manson Passey end of March that she needs to keep as she has a lot of chronic issues that need investigating. Gavce enough to last until appt.

## 2011-09-19 NOTE — Progress Notes (Unsigned)
Pt presents at front desk c/o that her insurance would not pay for her xanax, after speaking w/ [facility] twice and speaking w/ another person re: medicare and medicaid and the payment process this year i called [facility] and spoke w/ another pharmacist, she ran the medication through and asked for additional information, her particular medicare plan will only pay for diazepam, clonazepam or clorazepate. This was discussed w/ dr Midwife and decided that today uds would be done, pt will be given clonazepam for 1 month and she must keep her march appt w/ dr brown. uds was obtained and script was given, explained to pt that effects may not be so immediate but it should keep her from withdrawal and help with anxiety. She was agreeable.

## 2011-09-20 ENCOUNTER — Telehealth: Payer: Self-pay | Admitting: *Deleted

## 2011-09-20 ENCOUNTER — Other Ambulatory Visit: Payer: Self-pay | Admitting: Internal Medicine

## 2011-09-20 LAB — PRESCRIPTION ABUSE MONITORING 15P, URINE
Amphetamine/Meth: NEGATIVE ng/mL
Barbiturate Screen, Urine: NEGATIVE ng/mL
Benzodiazepine Screen, Urine: POSITIVE ng/mL — ABNORMAL HIGH
Cannabinoid Scrn, Ur: POSITIVE ng/mL — ABNORMAL HIGH
Carisoprodol, Urine: NEGATIVE ng/mL
Fentanyl, Ur: POSITIVE ng/mL — ABNORMAL HIGH
Meperidine, Ur: NEGATIVE ng/mL
Opiate Screen, Urine: NEGATIVE ng/mL
Tramadol Scrn, Ur: NEGATIVE ng/mL
Zolpidem, Urine: NEGATIVE ng/mL

## 2011-09-20 MED ORDER — DIAZEPAM 5 MG PO TABS: 5.0000 mg | ORAL_TABLET | Freq: Three times a day (TID) | ORAL | Status: DC | PRN

## 2011-09-20 NOTE — Telephone Encounter (Signed)
Pt calls and states since insurance will not pay for xanax anymore she needs diazepam called to the pharm, when she was informed that she was given a script yesterday for clonazepam she states " oh, yeah but those aren't for the same thing, the pharmacist even told me they are not even close", i informed her that i gave her the script yesterday and explained that they would not give her the immediate "rush, kick, reaction" as the xanax but they should help with her anxiety and that she could discuss w/ dr brown at her appt the direction her care would need to go and what meds would be best for her but that the clonazepam would need to be used until the appt. She then stated that she knew diazepam would be a lot better, i informed her i would send this note to the physician. She then said ok and hung up

## 2011-09-20 NOTE — Progress Notes (Signed)
Patient contacted me regarding concerns about her transition of care. I have been managing Kristyana's pain and anxiety for several years now and she has been on a fairly stable regimen of Fentanyl Patch, Opana for breakthrough and alprazolam. No red flag behaviors and I usually gave her 3 months of scripts in advance. She does endorse the use of marijuana, no illicit drugs. I have educated on MJ use, and advised her to stop using- I personally never considered MJ in her urine to be a deal breaker for her pain and anxiety management but I did inform her of our clinic policy about drug screening and controlled prescription drugs.  She is unable to see Dr. Manson Passey until March. I have also discussed her care and this transition with him in person. Preeya has high anxiety about changing doctors and has anticipatory anxiety about losing her pain and anxiety medications. I provided reassurance that her pain and anxiety meds would be continued as long as there were no problems with illicit drugs, inappropriate refills or behavior- she has had past bad experiences when transitioning between residents in the clinic.   IIt is my opinion that having her on a stable pain and anxiety regimen over the past few years has improved her functional status and overall QOL.   Until she can be seen by Dr. Manson Passey I called in Diazapam 5mg  #120 to her pharmacy with no refills, awaiting xanax authorization.Klonopin has not been effective for her in the past as I have tried previous benzo-descalations which have led to seizure and near psychosis/suicidal ideation.  Please page me if I can be of assistance or provide additional information in this care transition.

## 2011-09-20 NOTE — Telephone Encounter (Signed)
Noted. Dr Manson Passey has a full sch otherwise I would try to get two appts together as this is shaping up to be an interesting visit. Prelim UDS results ABNL. Will wait for final results.

## 2011-09-22 ENCOUNTER — Encounter: Payer: Self-pay | Admitting: Internal Medicine

## 2011-09-22 DIAGNOSIS — F12980 Cannabis use, unspecified with anxiety disorder: Secondary | ICD-10-CM | POA: Insufficient documentation

## 2011-09-22 LAB — CANNABANOIDS (GC/LC/MS), URINE: THC-COOH (GC/LC/MS), ur confirm: 2966 NG/ML — ABNORMAL HIGH

## 2011-09-22 LAB — BENZODIAZEPINES (GC/LC/MS), URINE
Clonazepam metabolite (GC/LC/MS), ur confirm: NEGATIVE NG/ML
Diazepam (GC/LC/MS), ur confirm: NEGATIVE NG/ML
Estazolam (GC/LC/MS), ur confirm: NEGATIVE NG/ML
Lorazepam (GC/LC/MS), ur confirm: NEGATIVE NG/ML
Midazolam (GC/LC/MS), ur confirm: NEGATIVE NG/ML
Nordiazepam (GC/LC/MS), ur confirm: NEGATIVE NG/ML
Oxazepam (GC/LC/MS), ur confirm: NEGATIVE NG/ML
Triazolam metabolite (GC/LC/MS), ur confirm: NEGATIVE NG/ML

## 2011-09-22 LAB — OPIATES/OPIOIDS (LC/MS-MS)
Codeine Urine: NEGATIVE NG/ML
Hydrocodone: NEGATIVE NG/ML
Hydromorphone: NEGATIVE NG/ML
Oxycodone, ur: NEGATIVE NG/ML
Oxymorphone: 8430 NG/ML — ABNORMAL HIGH

## 2011-09-22 LAB — METHADONE (GC/LC/MS), URINE: EDDP (GC/LC/MS), ur confirm: 2120 NG/ML — ABNORMAL HIGH

## 2011-09-23 ENCOUNTER — Telehealth: Payer: Self-pay | Admitting: *Deleted

## 2011-09-23 ENCOUNTER — Encounter: Payer: Medicare Other | Admitting: Internal Medicine

## 2011-09-23 ENCOUNTER — Ambulatory Visit (INDEPENDENT_AMBULATORY_CARE_PROVIDER_SITE_OTHER): Payer: Medicare Other | Admitting: Internal Medicine

## 2011-09-23 ENCOUNTER — Encounter: Payer: Self-pay | Admitting: Internal Medicine

## 2011-09-23 VITALS — BP 123/78 | HR 90 | Temp 97.6°F | Ht 67.0 in | Wt 108.5 lb

## 2011-09-23 DIAGNOSIS — F411 Generalized anxiety disorder: Secondary | ICD-10-CM

## 2011-09-23 DIAGNOSIS — F419 Anxiety disorder, unspecified: Secondary | ICD-10-CM

## 2011-09-23 MED ORDER — ALPRAZOLAM 1 MG PO TABS: 1.0000 mg | ORAL_TABLET | Freq: Three times a day (TID) | ORAL | Status: DC | PRN
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
## 2011-09-23 NOTE — Telephone Encounter (Signed)
We have appt opening this afternoon. Please have her make appt and bring bottles. This appt is NOT to go over chronic pain, anxiety, depression. It is just to address and document this acute issue. She needs to bring back both valium and clonazepam in their original bottles. Please do not inform her of the abnl UDS result.

## 2011-09-23 NOTE — Telephone Encounter (Signed)
Thank you :)

## 2011-09-23 NOTE — Patient Instructions (Addendum)
1. Restart the Xanax 1 mg tablets.  Take 1 tablet 3 times daily for your anxiety  2.  Make sure you keep your appointment with Dr. Manson Passey on 10/19/11 so you can discuss your long term medications.

## 2011-09-23 NOTE — Progress Notes (Signed)
Diazepam - 108 tablets disposed of in sharps container; witnessed by Trixie Dredge.

## 2011-09-23 NOTE — Telephone Encounter (Signed)
Pt states she lives in [place] and will not be to get here until about 4:00PM. Last appt is 3:45PM. Pt finally agreed to 3:15PM appt.

## 2011-09-23 NOTE — Telephone Encounter (Signed)
Call from pt. States she is "shaking all over" and "itching" from the Diazepam 5mg .  She wants to be changed back to Xanax 2mg .  And her insurance will pay for the Xanax now. Also she is willing to bring in the bottle of remaining Diazepam tablets. Please advise.  Thanks

## 2011-10-14 ENCOUNTER — Other Ambulatory Visit: Payer: Self-pay | Admitting: *Deleted

## 2011-10-14 ENCOUNTER — Emergency Department (HOSPITAL_COMMUNITY)
Admission: EM | Admit: 2011-10-14 | Discharge: 2011-10-14 | Disposition: A | Discharge status: Home / Self Care | Payer: Medicare Other | Attending: Emergency Medicine | Admitting: Emergency Medicine

## 2011-10-14 DIAGNOSIS — F419 Anxiety disorder, unspecified: Secondary | ICD-10-CM

## 2011-10-14 DIAGNOSIS — Z0389 Encounter for observation for other suspected diseases and conditions ruled out: Secondary | ICD-10-CM | POA: Insufficient documentation

## 2011-10-14 NOTE — Telephone Encounter (Signed)
Last refill 2/22

## 2011-10-14 NOTE — ED Notes (Addendum)
Pt hasn't felt well x 1 week.  Reports dry cough, nausea, vomited this am, and no BM x 2-3 days.  Has fentanyl patch on for chronic back pain.  Reports not having eaten x few days.  124/70, 112, 99% RA.

## 2011-10-17 MED ORDER — ALPRAZOLAM 1 MG PO TABS: 1.0000 mg | ORAL_TABLET | Freq: Three times a day (TID) | ORAL | Status: DC | PRN

## 2011-10-17 NOTE — Telephone Encounter (Signed)
Rx called in 

## 2011-10-19 ENCOUNTER — Encounter: Payer: Self-pay | Admitting: Internal Medicine

## 2011-10-19 ENCOUNTER — Ambulatory Visit (INDEPENDENT_AMBULATORY_CARE_PROVIDER_SITE_OTHER): Payer: Medicare Other | Admitting: Internal Medicine

## 2011-10-19 VITALS — BP 129/78 | HR 86 | Temp 97.5°F | Ht 67.0 in | Wt 107.7 lb

## 2011-10-19 DIAGNOSIS — Z0000 Encounter for general adult medical examination without abnormal findings: Secondary | ICD-10-CM

## 2011-10-19 DIAGNOSIS — F419 Anxiety disorder, unspecified: Secondary | ICD-10-CM

## 2011-10-19 DIAGNOSIS — G894 Chronic pain syndrome: Secondary | ICD-10-CM

## 2011-10-19 DIAGNOSIS — F411 Generalized anxiety disorder: Secondary | ICD-10-CM

## 2011-10-19 MED ORDER — FENTANYL 100 MCG/HR TD PT72: 1.0000 | MEDICATED_PATCH | TRANSDERMAL | Status: DC

## 2011-10-19 MED ORDER — OXYMORPHONE HCL 10 MG PO TABS: 10.0000 mg | ORAL_TABLET | ORAL | Status: DC | PRN

## 2011-10-19 NOTE — Assessment & Plan Note (Addendum)
The patient's current back pain likely reflects a combination of true musculoskeletal back pain (given disc fragment, disc protrusion, and cord flattening on cervical and lumbar MRI), plus aspects of chronic pain syndrome and narcotic medication tolerance (given her long-term use of narcotics, gradually escalating doses, history of substance addiction).  I am highly concerned about the use of Methadone along with her current medication regimen, and we discussed at length that another documented use of non-prescribed methadone will result in immediate termination of the patient's pain contract.  The patient expressed understanding, and stated that it was a "stupid mistake" that she promises not to make again.  I am less concerned about the patient's use of marijuana, but we discussed at length that a positive UDS for marijuana can prevent her from accessing other resources such as a Pain Clinic that could help her symptoms, and that using any further sedating substance with her already large doses of narcotics can be hazardous to her health.   However, I cannot deny that the patient has received significant benefit from her current pain regimen, crafted with great care and deliberation by Dr. Phillips Odor.  With Fentanyl and Hydromorphone, the patient is able to perform ADL's, run errands, and play with her granddaughter.  Equally importantly in my mind, through the use of these medications and a therapeutic relationship with her PCP, we have been able to treat the patient's other significant health problems, such as COPD, without just wasting time arguing over pain medications at each office visit.  As physicians, we try to prolong life and improve quality of life, and I feel that we have been able to do these things for this patient because of the way we've managed her pain medications to date.  I've decided to treat her use of Methadone as a violation of her pain contract with Dr. Phillips Odor, resulting in termination  of that contract.  Since the patient will henceforth be under my care, I have decided to give her a second chance to uphold a pain contract with our clinic, and I have signed a new pain contract with the patient for Fentanyl 100 mcg q48hrs, 15 patches/month, and Oxymorphone 10 mg q4hrs prn, 120 tabs/month (ie her current medication dosages).  Since these medication doses are so high, and since the patient has benefited from procedural interventions such as nerve blocks in the past, I am requiring that the patient establish care with a Pain Clinic as well.  The patient is reluctant to take this step, citing the prohibitively high cost of gas to drive to [facility] from [employer], but agrees to this measure if it is a requirement to continue receiving her medications.  A Pain Clinic may not accept her, or may determine that there is nothing else they would do for her, but I will be calling Clinics tomorrow to try to get her an appointment.
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
## 2011-10-19 NOTE — Patient Instructions (Signed)
We will call you with an appointment for the pain clinic.  It is very important to keep this visit.  We discussed the new pain medication contract at this visit.  Please keep a copy for your records, and understand the the terms of the contract are absolute.  Please return in 1 month for a follow-up visit about your other health issues.

## 2011-10-19 NOTE — Assessment & Plan Note (Signed)
I believe the patient's current symptoms of anxiety stem from a combination of true underlying Axis I anxiety disorder, plus aspects of benzodiazepine dependence and tolerance.  The patient has previously tried SSRI's (Paxil, Lexapro), SNRI's (Effexor, Cymbalta), and an A-2 antagonist (Remeron), but was unable to tolerate side effects, including increased suicidal ideation.  Additionally, the patient has listed allergies to Abilify (SI) and Pregabalin (hives), and should not be prescribed TCA's due to her high risk of suicidal ideation.  Furthermore, the patient appears very mistrustful of psychiatrists, and after our conversation I get the unclear feeling that perhaps seeing a psychiatrist produces some feelings of mental conflict of religion (ie if she sees this as showing mistrust in [employer] plan?) vs social stigma.  The patient has a history of substance abuse, and as such benzos are a poor choice of treatment.  However, the patient is already taking Xanax, and has a history of severe adverse withdrawal symptoms.  I have decided to sign a new medication contract with the patient for Xanax, 2 mg TID prn, 90 tabs/month  This quantity should not be increased in the future.  I would ideally also like to have the patient see a psychologist or psychiatrist, but given the prohibitive cost of transportation, and her strong feelings of mistrust towards such providers, added to the fact that we are already requiring her to seek care with a pain clinic, I feel that making this a requirement for her at this time would be setting her up for failure.  In the future, we can discuss such referrals, and the possibility of adding Buspirone (which I believe she has not yet tried) to her anxiety regimen.

## 2011-10-19 NOTE — Assessment & Plan Note (Signed)
We were unable to discuss any other aspects of the patient's health today, due to the need to clarify the patient's controlled substance use.  I hope that with this plan in place, we will now be able to move forward with our care for this patient.

## 2011-11-07 ENCOUNTER — Telehealth: Payer: Self-pay | Admitting: *Deleted

## 2011-11-07 NOTE — Telephone Encounter (Signed)
Pt called with c/o cough and feeling tired.  She wants to be seen. I called pt back and no answer.  No voice mail.  Will try again.

## 2011-11-07 NOTE — Telephone Encounter (Signed)
If [name] feels she needs to be seen I agree with the above recommendation.  It sounds like she has a viral illness that she is spontaneously recovering from and she should conitinue with symptomatic therapy.  She is probably a better candidate for Urgent Care than the Emergency Department given the description of her symptoms.

## 2011-11-07 NOTE — Telephone Encounter (Signed)
Pt called with c/o 4 days of  Fever, ( states she was reaching for things that were not there). She also fell off bed. This lasted for 2 days, now better. She still has productive cough and congestion. She took meds, OTC for cough and decongestant. She is able to drink some. No fever at present. She feels she is dehydrated, aches and no energy.  Has been in bed for 3 days.  We have no appointments today or tomorrow so I advised UCC or ED today for evaluation today. Pt voices understanding and will get a ride.

## 2011-11-14 ENCOUNTER — Encounter: Payer: Self-pay | Admitting: Physical Medicine & Rehabilitation

## 2011-11-15 ENCOUNTER — Telehealth: Payer: Self-pay | Admitting: *Deleted

## 2011-11-15 NOTE — Telephone Encounter (Signed)
Pt called asking for you to write a new Rx for a small oxygen tank to use during the day.  She uses oxygen at night and has a large tank. During the day she gets out of breath with ambulation. [facility] #  is [phone]  Would you like the pt to come in for evaluation for this?

## 2011-11-17 ENCOUNTER — Telehealth: Payer: Self-pay | Admitting: *Deleted

## 2011-11-17 NOTE — Telephone Encounter (Signed)
Pt called again today to see if she could get a smaller portable O2 tank. Dr. Manson Passey spoke with pt about the fact that insurance might not cover a small tank just at night. Dr. Manson Passey then spoke to [facility] and determined pt could be evaluated for the smaller portable tank, written Rx for this evaluation faxed approx. 12 noon to [facility] - pt notified that order had been sent from Dr. Manson Passey to [facility] and that [facility] would evaluate her needs and appropriate treatment, including insurance coverage and Dr. Manson Passey or the clinic would be back in touch with her after [facility] eval completed and sent to her dr. Pt verbalized understanding of this plan.

## 2011-11-17 NOTE — Telephone Encounter (Signed)
Spoke with patient.  She has recently had a viral URI, and still notes some dyspnea.  She has been using oxygen during the day as needed to cope with these symptoms, but the only oxygen tanks she has are the large tanks that she uses at night while sleeping.  I told her it was fine to use oxygen for symptomatic relief during the day as she recovers from this URI.  I called [facility], and I'll fax over an order for a portable oxygen evaluation, to see if she qualifies for a portable tank.

## 2011-11-21 ENCOUNTER — Telehealth: Payer: Self-pay | Admitting: *Deleted

## 2011-11-21 NOTE — Telephone Encounter (Signed)
[facility] needs an order how to use O2 during the day - [facility] has order to elvalute for portable O2. Needs to be written so we can fax Rx to [facility]. Stanton Kidney Rollyn Scialdone RN 11/21/11 3:40PM

## 2011-11-22 ENCOUNTER — Telehealth: Payer: Self-pay | Admitting: *Deleted

## 2011-11-22 NOTE — Telephone Encounter (Signed)
Talked with pt - potable O2 is only for use during the day  while getting over URI per Dr Manson Passey. Has Appt 12/07/11 - if pt needs daytime O2 _ will elvalute at next appt. Stanton Kidney Batoul Limes RN 11/22/11 3PM

## 2011-11-22 NOTE — Telephone Encounter (Signed)
Notes faxed to [facility]  - att: Jessica  on pt 11/15/11, 11/17/11 and office visit 07/21/11 - looking for PSO2. Still trying to address getting portable O2.

## 2011-11-23 NOTE — Telephone Encounter (Signed)
Stanton Kidney and I spoke in triage about this issue yesterday.  The patient does not meet oxygen desaturation criteria to qualify for continuous oxygen usage during the day.  She has been requesting a portable oxygen tank to help her symptomatically get over a viral URI.  I wrote a prescription detailing the situation, which Stanton Kidney faxed to home health.  If they are able to provide this for her, I believe it would give her symptomatic relief.  If they are unable to do so, and the patient believes her symptoms are appropriately severe, the next step would be to get her a clinic visit, walk with and without supplemental oxygen, and record her oxygen saturation levels.

## 2011-12-03 NOTE — Assessment & Plan Note (Signed)
She has been on Xanax for sometime and has several previous SSRIs listed on her list but state that none work for her.  She has not seen her PCP Dr. Manson Passey before so we will refill her medication today and have her follow up with him for long term control of her anxiety since she was added to my schedule today for a 15 min acute visit and this will likely take much longer to work out.

## 2011-12-07 ENCOUNTER — Encounter: Payer: Medicare Other | Admitting: Internal Medicine

## 2011-12-07 ENCOUNTER — Encounter: Payer: Self-pay | Admitting: Internal Medicine

## 2011-12-07 ENCOUNTER — Ambulatory Visit (INDEPENDENT_AMBULATORY_CARE_PROVIDER_SITE_OTHER): Payer: Medicare Other | Admitting: Internal Medicine

## 2011-12-07 VITALS — BP 146/83 | HR 116 | Temp 97.3°F | Ht 67.0 in | Wt 102.2 lb

## 2011-12-07 DIAGNOSIS — Z1231 Encounter for screening mammogram for malignant neoplasm of breast: Secondary | ICD-10-CM

## 2011-12-07 DIAGNOSIS — F17200 Nicotine dependence, unspecified, uncomplicated: Secondary | ICD-10-CM

## 2011-12-07 DIAGNOSIS — Z720 Tobacco use: Secondary | ICD-10-CM

## 2011-12-07 DIAGNOSIS — Z0000 Encounter for general adult medical examination without abnormal findings: Secondary | ICD-10-CM

## 2011-12-07 DIAGNOSIS — Z1239 Encounter for other screening for malignant neoplasm of breast: Secondary | ICD-10-CM

## 2011-12-07 DIAGNOSIS — F1910 Other psychoactive substance abuse, uncomplicated: Secondary | ICD-10-CM

## 2011-12-07 DIAGNOSIS — J449 Chronic obstructive pulmonary disease, unspecified: Secondary | ICD-10-CM

## 2011-12-07 DIAGNOSIS — Z8541 Personal history of malignant neoplasm of cervix uteri: Secondary | ICD-10-CM

## 2011-12-07 DIAGNOSIS — Z780 Asymptomatic menopausal state: Secondary | ICD-10-CM

## 2011-12-07 DIAGNOSIS — R636 Underweight: Secondary | ICD-10-CM

## 2011-12-07 DIAGNOSIS — G894 Chronic pain syndrome: Secondary | ICD-10-CM

## 2011-12-07 DIAGNOSIS — F1190 Opioid use, unspecified, uncomplicated: Secondary | ICD-10-CM

## 2011-12-07 MED ORDER — FENTANYL 100 MCG/HR TD PT72: 1.0000 | MEDICATED_PATCH | TRANSDERMAL | Status: DC

## 2011-12-07 MED ORDER — DRONABINOL 2.5 MG PO CAPS: 2.5000 mg | ORAL_CAPSULE | Freq: Two times a day (BID) | ORAL | Status: AC

## 2011-12-07 MED ORDER — OXYMORPHONE HCL 10 MG PO TABS: 10.0000 mg | ORAL_TABLET | ORAL | Status: DC | PRN

## 2011-12-07 MED ORDER — NICOTINE POLACRILEX 2 MG MT GUM: 2.0000 mg | CHEWING_GUM | OROMUCOSAL | Status: DC | PRN

## 2011-12-07 NOTE — Patient Instructions (Signed)
Given your history of cervical cancer, it is absolutely essential that you get a pap smear as soon as you are able.  We are referring you to OB/GYN, they will call you with an appointment.  We are scheduling you for a Mammogram as well.  We will call you with this appointment.  Quitting smoking is the best thing you can do for your health at this time.  We are prescribing you nicotine gum.  Chew 1 piece whenever you get a craving, up to every 1-2 hours.  The cravings should go away over the course of 3 months.  If you relapse, we can set up an appointment with our Social Worker to give you tips on smoking cessation.  For your appetite, we are prescribing Marinol.  Take 1 tablet twice per day before meals (before lunch and dinner).

## 2011-12-08 ENCOUNTER — Telehealth: Payer: Self-pay | Admitting: *Deleted

## 2011-12-08 DIAGNOSIS — Z720 Tobacco use: Secondary | ICD-10-CM | POA: Insufficient documentation

## 2011-12-08 DIAGNOSIS — R634 Abnormal weight loss: Secondary | ICD-10-CM | POA: Insufficient documentation

## 2011-12-08 LAB — PRESCRIPTION ABUSE MONITORING 15P, URINE
Barbiturate Screen, Urine: NEGATIVE ng/mL
Buprenorphine, Urine: NEGATIVE ng/mL
Cocaine Metabolites: NEGATIVE ng/mL
Creatinine, Urine: 86.85 mg/dL (ref >20.0)
Meperidine, Ur: NEGATIVE ng/mL
Methadone Screen, Urine: NEGATIVE ng/mL
Opiate Screen, Urine: NEGATIVE ng/mL
Oxycodone Screen, Ur: POSITIVE ng/mL — ABNORMAL HIGH
Propoxyphene: NEGATIVE ng/mL

## 2011-12-08 NOTE — Progress Notes (Signed)
HPI The patient is a 51 yo woman, history of anxiety, depression, cervical cancer, COPD, and GERD, presenting for a follow-up visit.  The patient notes a reported history of cervical cancer "10-15 years ago", s/p what she describes as a likely LEEP procedure, with no known recurrence.  However, the patient notes she has not had a pap smear in at least 5 years.  The patient notes that she would be amenable to pap smear, but refuses a pap smear by a female practitioner.  She notes that she has previously been seen by OB/GYN.  The patient notes a history of tobacco abuse.  She started smoking at age 44, has tried to quit several times, and now tries to quit "about every month", with attempts lasting 1-3 days.  Triggers include anxiety and craving a morning cigarette.  She has tried using nicotine patches in the past, with little success.  Now she just "prays to God" to give her strength.  She is very suspicious of the medical establishment, and states that she doesn't want help quitting, relying predominantly on faith.  The patient is underweight.  She notes that she has had a low appetite for years.  Chart review reveals that her weight has fluctuated between 100-130 pounds over the last 2 years, though with a downward trend.  She notes no dysphagia, odynophagia, abdominal pain, or night sweats.  She has tried megace in the past with some success and asks for a refill.  The patient has a history of COPD.  She uses O2 at night, and requests a portable tank to use as needed during the day.  Nurse Joyce Gross walked with the patient on room air, and documented O2 sats as low as 91% on RA with ambulation.  She is currently managed on advair and spiriva, using albuterol 1-2 times/month.  ROS: General: no fevers, chills, changes in appetite Skin: no rash HEENT: no blurry vision, hearing changes, sore throat Pulm: see HPI CV: no chest pain, palpitations, shortness of breath Abd: no abdominal pain, nausea/vomiting,  diarrhea/constipation GU: no dysuria, hematuria, polyuria Ext: no arthralgias, myalgias Neuro: no weakness, numbness, or tingling  Filed Vitals:   12/07/11 1658  BP:   Pulse: 116  Temp:     PEX General: alert, cooperative, and in no apparent distress HEENT: pupils equal round and reactive to light, vision grossly intact, oropharynx clear and non-erythematous  Neck: supple, no lymphadenopathy Lungs: clear to ascultation bilaterally, normal work of respiration, no wheezes, rales, ronchi Heart: regular rate and rhythm, no murmurs, gallops, or rubs Abdomen: soft, non-tender, non-distended, normal bowel sounds Msk: no joint edema, warmth, or erythema Extremities: no cyanosis, clubbing, or edema Neurologic: alert & oriented X3, cranial nerves II-XII intact, strength grossly intact, sensation intact to light touch  Assessment/Plan:

## 2011-12-08 NOTE — Assessment & Plan Note (Addendum)
See prior office visit for extensive discussion of chronic pain.  Patient is thinking about changing to [facility] pharmacy, as she believes the [facility] staff is "judging her".  She asks my permission to change pharmacies.  I grant it.  I let her know that if she does change pharmacies, she'll need to stick with that one, and we will need to sign a new medication contract at her next visit. -UDS today -medication refills -I'll be monitoring the narcotics database to ensure no abuse

## 2011-12-08 NOTE — Assessment & Plan Note (Addendum)
History of COPD, well-controlled on advair and spiriva.  Patient wants portable O2 for comfort, but does not qualify via ambulatory O2 sats of 91%. -continue advair and spiriva, with albuterol prn -PFT's located in PFT labs, being faxed to clinic now

## 2011-12-08 NOTE — Assessment & Plan Note (Signed)
-  last mammogram result we have was normal in 2010.  Chart review reveals possible mammogram 1 year ago, but result not in system.  Will refer for mammogram -patient reports colonoscopy within the last 1-2 years.  Will attempt to obtain records.  If unable, will address colonoscopy vs FOBC's at next visit

## 2011-12-08 NOTE — Assessment & Plan Note (Signed)
Patient's weight has fluctuated, but has generally trended down over the last 2 years.  She has tried megace for appetite stimulation, but also has DVT risk factors of smoking and prior cancer, so I will discontinue megace today.  Cancer screening for her will be key given her weight loss and cancer history (see above).  Normal EGD in 2010. -discontinue megace -start marinol for appetite stimulation

## 2011-12-08 NOTE — Assessment & Plan Note (Addendum)
The patient has a self-reported history of cervical cancer (no records available), but has had no PAP smear in several years.  She has weight loss as described, but no night sweats, no vaginal pain/discharge.  I am VERY concerned about cervical cancer screening in this patient, given her history.  The patient declines a PAP today, preferring a female provider.  Since I am the patient's PCP (and a female), I will refer to OB/GYN, as the patient reports being seen by OB/GYN in the past, and given the patient's history of prior cancer. -referral to OB/GYN for pap smear -I will continue to be this patient's PCP for the next 2 years.  When I leave the program, I will request that the patient be assigned a female PCP.  I believe it would be detrimental to the patient's care to transfer care to one of my female colleagues at this time, given the patient's difficulties with her controlled substances when transitioning care in the past.

## 2011-12-08 NOTE — Assessment & Plan Note (Signed)
The patient has a history of tobacco abuse, and currently wants to quit.  She appears mistrustful of medical interventions, refusing oral medications to help her quit.  She has tried nicotine patches in the past without success.  She agrees to try nicotine gum.  The patient was made aware of our Social Worker and her ability to counsel the patient on smoking cessation, as well as medications that may be useful for smoking cessation. -prescribed nicotine gum -follow-up at next appointment

## 2011-12-08 NOTE — Telephone Encounter (Signed)
There is a fax note from [facility] that Dronabinol is not covered by pt's Medicare part D, so Medicaid will also not pay. Are there any alternative med, same applies for nicotine gum. Stanton Kidney Cordie Buening RN 12/08/11 4PM

## 2011-12-12 ENCOUNTER — Telehealth: Payer: Self-pay | Admitting: Licensed Clinical Social Worker

## 2011-12-12 ENCOUNTER — Encounter: Payer: Self-pay | Admitting: Obstetrics and Gynecology

## 2011-12-12 LAB — OPIATES/OPIOIDS (LC/MS-MS)
Heroin (6-AM), UR: NEGATIVE NG/ML
Hydrocodone: NEGATIVE NG/ML
Oxymorphone: 8050 NG/ML — ABNORMAL HIGH

## 2011-12-12 LAB — BENZODIAZEPINES (GC/LC/MS), URINE
Alprazolam metabolite (GC/LC/MS), ur confirm: 1870 NG/ML — ABNORMAL HIGH
Clonazepam metabolite (GC/LC/MS), ur confirm: NEGATIVE NG/ML
Estazolam (GC/LC/MS), ur confirm: NEGATIVE NG/ML
Flurazepam metabolite (GC/LC/MS), ur confirm: NEGATIVE NG/ML
Midazolam (GC/LC/MS), ur confirm: NEGATIVE NG/ML
Oxazepam (GC/LC/MS), ur confirm: NEGATIVE NG/ML
Temazepam (GC/LC/MS), ur confirm: NEGATIVE NG/ML

## 2011-12-12 LAB — CANNABANOIDS (GC/LC/MS), URINE: THC-COOH (GC/LC/MS), ur confirm: 21 NG/ML — ABNORMAL HIGH

## 2011-12-12 LAB — FENTANYL (GC/LC/MS), URINE: Norfentanyl, confirm: 522 NG/ML — ABNORMAL HIGH

## 2011-12-12 MED ORDER — NICOTINE POLACRILEX 2 MG MT GUM: 2.0000 mg | CHEWING_GUM | OROMUCOSAL | Status: AC | PRN

## 2011-12-12 NOTE — Telephone Encounter (Signed)
Spoke with Clinic Social Worker, [place].  Per [facility], we believe the specific nicotine gum that I prescribed was not covered under insurance, but that other brands of nicotine gum will be covered.  I prescribed one of the gum prescriptions from the [facility] list of acceptable nicotine gum prescriptions, which will hopefully work.  I will follow-up to ensure pharmacy was able to fill prescription.  In terms of Marinol, we will have to try another medication for appetite stimulation.  Megace is a poor choice, given her history of smoking and prior reported cancer (DVT risk).  We will reassess at next appointment.

## 2011-12-12 NOTE — Progress Notes (Signed)
Addended by: Linward Headland on: 12/12/2011 04:11 PM   Modules accepted: Orders

## 2011-12-12 NOTE — Telephone Encounter (Signed)
Pt referred to CSW for smoking cessation and discounts for Nicotine Replacement Therapy, gum.  Pt's pharmacy informed RN, pt's Medicare D will not cover current Rx for NRT and therefore Medicaid will not.  CSW placed call to pt to discuss smoking cessation and offer CSW services.  Pt declines at the time, but would like to utilized NRT.  CSW informed pt there are specifics NRT Medicaid will cover and will need to have RN/MD confirm her Rx is on that listing.  CSW also informed pt, [place] 800-QUIT-NOW line is currently offering NRT free or discounted on a first come/first serve basis.

## 2011-12-16 ENCOUNTER — Other Ambulatory Visit: Payer: Self-pay | Admitting: *Deleted

## 2011-12-16 ENCOUNTER — Encounter: Payer: Self-pay | Admitting: Internal Medicine

## 2011-12-16 DIAGNOSIS — F1190 Opioid use, unspecified, uncomplicated: Secondary | ICD-10-CM

## 2011-12-16 MED ORDER — OXYMORPHONE HCL 10 MG PO TABS: 10.0000 mg | ORAL_TABLET | ORAL | Status: DC | PRN

## 2011-12-16 MED ORDER — FENTANYL 100 MCG/HR TD PT72: 1.0000 | MEDICATED_PATCH | TRANSDERMAL | Status: DC

## 2011-12-16 NOTE — Telephone Encounter (Signed)
Pt aware Rx to be picked up.
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
## 2011-12-16 NOTE — Telephone Encounter (Signed)
I discussed with patient's PCP Dr. Manson Passey (see his note) and refilled #40 Opana and #3 Fentanyl patches to cover for loss of meds.  Prescriptions printed and signed - nurse to complete.  (Please note that I had to reprint both prescriptions; I destroyed the first copy of each.)

## 2011-12-28 ENCOUNTER — Telehealth: Payer: Self-pay | Admitting: Licensed Clinical Social Worker

## 2011-12-28 ENCOUNTER — Encounter: Payer: Medicare Other | Attending: Physical Medicine & Rehabilitation | Admitting: Physical Medicine & Rehabilitation

## 2011-12-28 NOTE — Telephone Encounter (Signed)
CSW placed call to [facility] to inquire if pt has been opened by their agency.  Left message indicated referral was faxed on 5/16, pt in need of assistance from pharmacist.  CSW will await return call from [facility].

## 2011-12-29 NOTE — Progress Notes (Signed)
Addended by: Neomia Dear on: 12/29/2011 08:09 PM   Modules accepted: Orders

## 2012-01-02 NOTE — Telephone Encounter (Signed)
Refaxed referral to [facility] as note from Ms. Stang stated referral was not received.

## 2012-01-06 ENCOUNTER — Ambulatory Visit (HOSPITAL_COMMUNITY): Payer: Medicare Other

## 2012-01-06 ENCOUNTER — Ambulatory Visit (HOSPITAL_COMMUNITY): Admission: RE | Admit: 2012-01-06 | Payer: Medicare Other | Source: Ambulatory Visit

## 2012-01-09 ENCOUNTER — Telehealth: Payer: Self-pay | Admitting: *Deleted

## 2012-01-09 ENCOUNTER — Encounter: Payer: Medicare Other | Admitting: Physician Assistant

## 2012-01-09 NOTE — Telephone Encounter (Signed)
Contacted [employer] after receiving prior authorization request from [employer].  At this time request has been denied as pt does not have a current cancer diagnosis, no anorexia with aids, or surgery or chemo induced nausea and vomiting.  Monia Pouch was informed that pt has been slowing losing weight over the past 2 years, and has a BMI of 16. She has tried megace for appetite stimulation, but also has DVT risk factors of smoking and prior cancer, so MD discontinued it.  Again medication has been denied, no alternatives was given, but it will be sent to a review board where the final decision will be made.  Monia Pouch was also made aware of pt's mental health problems (depression, anxiety) in an effort to get medication approved.  Review may take up to 3 days.  Will await final decision.Criss Alvine, Chastelyn Athens Cassady6/10/20139:21 AM

## 2012-01-13 ENCOUNTER — Encounter: Payer: Medicare Other | Admitting: Obstetrics and Gynecology

## 2012-01-18 NOTE — Addendum Note (Signed)
Addended by: Neomia Dear on: 01/18/2012 05:38 PM   Modules accepted: Orders

## 2012-01-23 ENCOUNTER — Other Ambulatory Visit: Payer: Self-pay | Admitting: *Deleted

## 2012-01-23 MED ORDER — ESOMEPRAZOLE MAGNESIUM 40 MG PO CPDR: 40.0000 mg | DELAYED_RELEASE_CAPSULE | Freq: Every day | ORAL | Status: DC

## 2012-01-31 NOTE — Progress Notes (Signed)
Patient ID: [name], female   DOB: April 05, 1961, 51 y.o.   MRN: [id number] Patient in for follow up on chronic pain. Ain in upper back, neck, also has worsening lumbar radiculopathy and foot drop. High levels of anxiety at baseline. On high dose chronic opiate therapy for pain. We discussed our new prescription drug monitoring program in the clinic. Her breathing is stable but she has moderate COPD, continues to smoke.  General appearance: NAD, conversant  Eyes: anicteric sclerae, moist conjunctivae; no lid-lag; PERRLA HENT: Atraumatic; oropharynx clear with moist mucous membranes and no mucosal ulcerations; normal hard and soft palate Neck: Trachea midline; FROM, supple, no thyromegaly or lymphadenopathy Lungs: CTA, with normal respiratory effort and no intercostal retractions CV: RRR, no MRGs  Abdomen: Soft, non-tender; no masses or HSM Extremities: No peripheral edema or extremity lymphadenopathy Skin: Normal temperature, turgor and texture; no rash, ulcers or subcutaneous nodules Psych: Appropriate affect, alert and oriented to person, place and time  A/P: see problem based charting

## 2012-02-01 NOTE — Telephone Encounter (Signed)
Contacted [employer] to get final decision on dronabinol rx.  Once again I was informed that pt did not meet criteria for this medication and it has been denied.  Will forward decision to pcp and inform pt of decision.Latoya Spittle Cassady7/3/20139:42 AM

## 2012-02-10 ENCOUNTER — Telehealth: Payer: Self-pay | Admitting: *Deleted

## 2012-02-10 NOTE — Telephone Encounter (Signed)
Patient should be seen and evaluated for nausea; the best option is urgent care.

## 2012-02-10 NOTE — Telephone Encounter (Signed)
Pt called with c/o nausea each time she eats.  Onset 4 days ago.  No vomiting. She is asking for phenergan, states she has taken in past.  I don't find it listed on med list. No other c/o.  Good BM today.  Pt goes to [employer].

## 2012-02-10 NOTE — Telephone Encounter (Signed)
Pt informed and voices understanding 

## 2012-02-21 ENCOUNTER — Ambulatory Visit (HOSPITAL_COMMUNITY)
Admission: RE | Admit: 2012-02-21 | Discharge: 2012-02-21 | Disposition: A | Discharge status: Home / Self Care | Payer: Medicare Other | Source: Ambulatory Visit | Attending: Internal Medicine | Admitting: Internal Medicine

## 2012-02-21 DIAGNOSIS — Z13820 Encounter for screening for osteoporosis: Secondary | ICD-10-CM | POA: Insufficient documentation

## 2012-02-21 DIAGNOSIS — Z1231 Encounter for screening mammogram for malignant neoplasm of breast: Secondary | ICD-10-CM | POA: Insufficient documentation

## 2012-02-21 DIAGNOSIS — Z780 Asymptomatic menopausal state: Secondary | ICD-10-CM | POA: Insufficient documentation

## 2012-03-07 ENCOUNTER — Encounter: Payer: Medicare Other | Admitting: Internal Medicine

## 2012-03-07 ENCOUNTER — Ambulatory Visit (INDEPENDENT_AMBULATORY_CARE_PROVIDER_SITE_OTHER): Payer: Medicare Other | Admitting: Internal Medicine

## 2012-03-07 ENCOUNTER — Encounter: Payer: Self-pay | Admitting: Internal Medicine

## 2012-03-07 ENCOUNTER — Other Ambulatory Visit (HOSPITAL_COMMUNITY)
Admission: RE | Admit: 2012-03-07 | Discharge: 2012-03-07 | Disposition: A | Discharge status: Home / Self Care | Payer: Medicare Other | Source: Ambulatory Visit | Attending: Internal Medicine | Admitting: Internal Medicine

## 2012-03-07 VITALS — BP 108/74 | HR 97 | Temp 97.2°F | Wt 94.0 lb

## 2012-03-07 DIAGNOSIS — Z0000 Encounter for general adult medical examination without abnormal findings: Secondary | ICD-10-CM

## 2012-03-07 DIAGNOSIS — G894 Chronic pain syndrome: Secondary | ICD-10-CM

## 2012-03-07 DIAGNOSIS — F1910 Other psychoactive substance abuse, uncomplicated: Secondary | ICD-10-CM

## 2012-03-07 DIAGNOSIS — F411 Generalized anxiety disorder: Secondary | ICD-10-CM

## 2012-03-07 DIAGNOSIS — F419 Anxiety disorder, unspecified: Secondary | ICD-10-CM

## 2012-03-07 DIAGNOSIS — Z124 Encounter for screening for malignant neoplasm of cervix: Secondary | ICD-10-CM | POA: Insufficient documentation

## 2012-03-07 DIAGNOSIS — K5900 Constipation, unspecified: Secondary | ICD-10-CM

## 2012-03-07 DIAGNOSIS — N760 Acute vaginitis: Secondary | ICD-10-CM | POA: Insufficient documentation

## 2012-03-07 DIAGNOSIS — N898 Other specified noninflammatory disorders of vagina: Secondary | ICD-10-CM

## 2012-03-07 DIAGNOSIS — F1190 Opioid use, unspecified, uncomplicated: Secondary | ICD-10-CM

## 2012-03-07 MED ORDER — FENTANYL 100 MCG/HR TD PT72: 1.0000 | MEDICATED_PATCH | TRANSDERMAL | Status: DC

## 2012-03-07 MED ORDER — ALPRAZOLAM 2 MG PO TABS: 2.0000 mg | ORAL_TABLET | Freq: Three times a day (TID) | ORAL | Status: DC | PRN

## 2012-03-07 MED ORDER — OXYMORPHONE HCL 10 MG PO TABS: 10.0000 mg | ORAL_TABLET | ORAL | Status: DC | PRN

## 2012-03-07 NOTE — Progress Notes (Signed)
Subjective:   Patient ID: [name] female   DOB: July 07, 1961 51 y.o.   MRN: [id number]  HPI: [name] is a 51 y.o. woman with history of anxiety, depression, chronic back pain, COPD, arthritis, and cervical cancer status post cone resection. She presents today for Pap smear, as she was not comfortable with a female physician for this exam. Given her low weight and history of cervical cancer, it was very important to have a Pap smear done.  She reports a irritating discharge that she's had for some time, but cannot describe the color. She has not been sexually active for the last 5 years. No recent antibiotics. She is postmenopausal.  She also complains of nausea, and requests Phenergan. She reports she has been nauseated for the last one to 2 weeks. She is on extensive pain medication regimen, and does not have regular bowel movements. She reports one to 2 bowel movements per week, which she has to strain for many times. She reports that her bowel regimen is MiraLAX, Senokot, and prune juice.  Past Medical History  Diagnosis Date  . Anxiety   . Depression   . Cervical cancer      treated by Dr. Normand Sloop  . Chronic back pain      her CT of the head without contrast June 28, 2008- moderate to large right paracentral disc protrusion at C4-C5 resulting in a right C5 neuroforaminal stenosis and probably some degree of spinal stenosis. C. bilateral C6 neuroforaminal stenosis related to  degenerative hypertrophy, no acute fracture or loose pieces identifying the cervical spine, ligamentous injury is not excluded.    Marland Kitchen Headache   . COPD (chronic obstructive pulmonary disease)   . Arthritis   . GERD (gastroesophageal reflux disease)  October 2011     Williams, esophagogram - tiny amount of gastroesophageal reflux otherwise unremarkable exam, done by Dr. Tinnie Gens   Current Outpatient Prescriptions  Medication Sig Dispense Refill  . ALPRAZolam (XANAX) 2 MG tablet Take 1 tablet (2 mg  total) by mouth 3 (three) times daily as needed for anxiety.  90 tablet  2  . esomeprazole (NEXIUM) 40 MG capsule Take 1 capsule (40 mg total) by mouth at bedtime.  30 capsule  6  . fentaNYL (DURAGESIC - DOSED MCG/HR) 100 MCG/HR Place 1 patch (100 mcg total) onto the skin every other day.  15 patch  0  . oxymorphone (OPANA) 10 MG tablet Take 1 tablet (10 mg total) by mouth every 4 (four) hours as needed for pain.  120 tablet  0  . Polyethylene Glycol 3350 POWD Take 17 g by mouth daily as needed. In 10 ounces of water once or twice daily for constipation. Titrate as needed      . SYMBICORT 160-4.5 MCG/ACT inhaler ONE PUFF TWICE DAILY AS DIRECTED  1 Inhaler  11  . tiotropium (SPIRIVA) 18 MCG inhalation capsule Place 18 mcg into inhaler and inhale daily.       Marland Kitchen DISCONTD: ALPRAZolam (XANAX) 1 MG tablet Take 1 tablet (1 mg total) by mouth 3 (three) times daily as needed for anxiety.  90 tablet  0  . DISCONTD: fentaNYL (DURAGESIC - DOSED MCG/HR) 100 MCG/HR Place 1 patch (100 mcg total) onto the skin every other day.  3 patch  0  . DISCONTD: fentaNYL (DURAGESIC - DOSED MCG/HR) 100 MCG/HR Place 1 patch (100 mcg total) onto the skin every other day.  3 patch  0  . DISCONTD: fentaNYL (DURAGESIC - DOSED MCG/HR)  100 MCG/HR Place 1 patch (100 mcg total) onto the skin every other day.  3 patch  0  . DISCONTD: fentaNYL (DURAGESIC - DOSED MCG/HR) 100 MCG/HR Place 1 patch (100 mcg total) onto the skin every other day.  15 patch  0  . DISCONTD: fentaNYL (DURAGESIC - DOSED MCG/HR) 100 MCG/HR Place 1 patch (100 mcg total) onto the skin every other day.  15 patch  0  . DISCONTD: oxymorphone (OPANA) 10 MG tablet Take 1 tablet (10 mg total) by mouth every 6 (six) hours as needed for pain. Take for BREAKTHROUGH PAIN only  90 tablet  0  . DISCONTD: oxymorphone (OPANA) 10 MG tablet Take 1 tablet (10 mg total) by mouth every 4 (four) hours as needed for pain.  30 tablet  0  . DISCONTD: oxymorphone (OPANA) 10 MG tablet Take 1  tablet (10 mg total) by mouth every 4 (four) hours as needed for pain.  30 tablet  0  . DISCONTD: oxymorphone (OPANA) 10 MG tablet Take 1 tablet (10 mg total) by mouth every 4 (four) hours as needed for pain.  90 tablet  0  . DISCONTD: oxymorphone (OPANA) 10 MG tablet Take 1 tablet (10 mg total) by mouth every 4 (four) hours as needed for pain.  120 tablet  0  . DISCONTD: oxymorphone (OPANA) 10 MG tablet Take 1 tablet (10 mg total) by mouth every 4 (four) hours as needed for pain.  120 tablet  0  . DISCONTD: oxymorphone (OPANA) 10 MG tablet Take 1 tablet (10 mg total) by mouth every 4 (four) hours as needed for pain.  40 tablet  0  . DISCONTD: oxymorphone (OPANA) 10 MG tablet Take 1 tablet (10 mg total) by mouth every 4 (four) hours as needed for pain.  120 tablet  0  . DISCONTD: oxymorphone (OPANA) 10 MG tablet Take 1 tablet (10 mg total) by mouth every 4 (four) hours as needed for pain.  120 tablet  0   Current Facility-Administered Medications  Medication Dose Route Frequency Provider Last Rate Last Dose  . cyanocobalamin ((VITAMIN B-12)) injection 1,000 mcg  1,000 mcg Intramuscular Q30 days Edsel Petrin, DO   1,000 mcg at 02/24/11 1218   Family History  Problem Relation Age of Onset  . Stroke Mother   . Diabetes Mother   . Heart disease Father   . Lung cancer Daughter    History   Social History  . Marital Status: Single    Spouse Name: N/A    Number of Children: N/A  . Years of Education: N/A   Social History Main Topics  . Smoking status: Current Everyday Smoker -- 1.0 packs/day    Types: Cigarettes  . Smokeless tobacco: None  . Alcohol Use: No  . Drug Use: No  . Sexually Active: None   Other Topics Concern  . None   Social History Narrative   10th grade education. Married then divorced mid 2's. One daughter. Crack cocaine about 1995 ish. One Willy Eddy admit 2001 for SI. Deemed disabled 04/01/2004 By [facility] due to documented health and mental issues.   Review of  Systems: As per history of present illness   Objective:  Physical Exam: Filed Vitals:   03/07/12 1412  BP: 108/74  Pulse: 97  Temp: 97.2 F (36.2 C)  TempSrc: Oral  Weight: 94 lb (42.638 kg)   Constitutional: Vital signs reviewed.  Patient is a well-developed and well-nourished woman in no acute distress and cooperative with exam.  Eyes: PERRL, EOMI, conjunctivae normal, No scleral icterus.  Cardiovascular: RRR, S1 normal, S2 normal, no MRG, pulses symmetric and intact bilaterally Pulmonary/Chest: CTAB, no wheezes, rales, or rhonchi Abdominal: Soft. Non-tender, non-distended, bowel sounds are normal, no masses, organomegaly, or guarding present.  Pelvic : Increased size of cervical os (about 1 cm); no obvious ulcerations or erythema on cervix, small vaginal vault, significant leukorrhea, no palpable adnexal masses laterally, no inguinal adenopathy  Neurological: A&O x3 Skin: Warm, dry and intact. No rash, cyanosis, or clubbing.  Psychiatric: Normal mood and affect. speech and behavior is normal. Judgment and thought content normal. Cognition and memory are normal.   Assessment & Plan:   Case and care discussed with Dr. Rogelia Boga. Given extensive pain regimen, patient to follow with Dr. Manson Passey at his next available appointment. Please see problem-oriented charting for further details.

## 2012-03-07 NOTE — Assessment & Plan Note (Signed)
This is most likely the cause of her nausea, and she only has one to 2 bowel movements per week. Constipation is most likely due to her extensive narcotic regimen. Her bowel regimen includes MiraLAX, Senokot, and prune juice. Apparently she's only been taking MiraLAX 1-2 times per week. I urged her to use a glycerin suppository or enema to clean herself out once, and then start using MiraLAX more regularly. If she continues to remain constipated, may consider adding sorbitol to her regimen.

## 2012-03-07 NOTE — Patient Instructions (Addendum)
-  Please be sure to try a mineral oil enema or glycerin suppository to help you have a bowel movement.  I suspect your nausea will improve if you have a bowel movement.  -We will call you with results of your exam today.  Please be sure to bring all of your medications with you to every visit.  Should you have any new or worsening symptoms, please be sure to call the clinic at [phone].

## 2012-03-07 NOTE — Assessment & Plan Note (Signed)
Panel 15 collected today. Patient to followup with Dr. Manson Passey at his next available appointment to monitor narcotic use.

## 2012-03-07 NOTE — Assessment & Plan Note (Signed)
Pap smear done today. We will call her with the results of the exam. Given leukorrhea on exam, I also collected a wet prep.

## 2012-03-07 NOTE — Progress Notes (Signed)
Prescriptions for fentanyl and opana was given to patient for August, September, and October refills. Copy made and placed in medication folder in triage. Rxs approved by Dr. Manson Passey.Kingsley Spittle Cassady8/7/20132:17 PM

## 2012-03-08 ENCOUNTER — Encounter: Payer: Medicare Other | Admitting: Internal Medicine

## 2012-03-08 LAB — PRESCRIPTION ABUSE MONITORING 15P, URINE
Amphetamine/Meth: NEGATIVE ng/mL
Barbiturate Screen, Urine: NEGATIVE ng/mL
Buprenorphine, Urine: NEGATIVE ng/mL
Cannabinoid Scrn, Ur: NEGATIVE ng/mL
Cocaine Metabolites: NEGATIVE ng/mL
Creatinine, Urine: 131.62 mg/dL (ref >20.0)
Meperidine, Ur: NEGATIVE ng/mL
Methadone Screen, Urine: NEGATIVE ng/mL
Tramadol Scrn, Ur: NEGATIVE ng/mL
Zolpidem, Urine: NEGATIVE ng/mL

## 2012-03-09 ENCOUNTER — Other Ambulatory Visit: Payer: Self-pay | Admitting: Internal Medicine

## 2012-03-09 ENCOUNTER — Encounter: Payer: Self-pay | Admitting: Internal Medicine

## 2012-03-09 DIAGNOSIS — B9689 Other specified bacterial agents as the cause of diseases classified elsewhere: Secondary | ICD-10-CM

## 2012-03-09 MED ORDER — METRONIDAZOLE 500 MG PO TABS: 500.0000 mg | ORAL_TABLET | Freq: Two times a day (BID) | ORAL | Status: AC

## 2012-03-09 NOTE — Progress Notes (Signed)
Attempted to phone patient to share results of vaginal exam, which was positive for gardnerella (bacterial vaginosis.)  I dialed the number on file, but there was no ring.  I tried multiple times.  I am sending a letter with results and instructions to take Metronidazole 500mg  PO BID x 7d and to abstain from EtOH while taking this medication.  I will also flag the front desk to try to reach patient as well.

## 2012-03-10 LAB — OPIATES/OPIOIDS (LC/MS-MS)
Heroin (6-AM), UR: NEGATIVE ng/mL
Hydromorphone: NEGATIVE ng/mL
Morphine Urine: NEGATIVE ng/mL
Oxycodone, ur: NEGATIVE ng/mL

## 2012-03-10 LAB — BENZODIAZEPINES (GC/LC/MS), URINE
Alprazolam metabolite (GC/LC/MS), ur confirm: 1275 ng/mL — ABNORMAL HIGH
Estazolam (GC/LC/MS), ur confirm: NEGATIVE ng/mL
Flunitrazepam metabolite (GC/LC/MS), ur confirm: NEGATIVE ng/mL
Flurazepam metabolite (GC/LC/MS), ur confirm: NEGATIVE ng/mL
Temazepam (GC/LC/MS), ur confirm: NEGATIVE ng/mL

## 2012-03-11 ENCOUNTER — Encounter: Payer: Self-pay | Admitting: Internal Medicine

## 2012-03-11 ENCOUNTER — Other Ambulatory Visit: Payer: Self-pay | Admitting: Internal Medicine

## 2012-03-11 DIAGNOSIS — M810 Age-related osteoporosis without current pathological fracture: Secondary | ICD-10-CM | POA: Insufficient documentation

## 2012-03-11 MED ORDER — CALCIUM CARBONATE-VITAMIN D 600-400 MG-UNIT PO TABS: 2.0000 | ORAL_TABLET | Freq: Every day | ORAL | Status: DC

## 2012-03-11 MED ORDER — ALENDRONATE SODIUM 70 MG PO TABS
70.0000 mg | ORAL_TABLET | ORAL | Status: DC
Start: 2012-03-11 — End: 2012-06-05

## 2012-03-11 NOTE — Progress Notes (Signed)
Called patient to inform her of results of recent pap smear and DEXA scan.  The patient's pap smear was normal.  DEXA showed new osteoporosis, with DEXA = -2.6 on L femur.  We will start calcium/vit D daily and alendronate weekly.

## 2012-03-12 ENCOUNTER — Telehealth: Payer: Self-pay | Admitting: *Deleted

## 2012-03-12 NOTE — Telephone Encounter (Signed)
Called [facility] to clarify.  Spoke with the pharmacist.  Pharmacist informed me that Fosamax is ready for pick-up, and that there was no problem with the medication.  She also informed me that although Ca-vit D is OTC, it is <$10 for a bottle of 100 pills, so about $4-5/month as well.  I called patient and informed her of this.  She thought fosamax was once/month instead of once/week, perhaps I misspoke when I called her about it yesterday (though I do specifically remember giving the example of picking 1 day a week, such as Monday, and taking it every Monday, but who knows).  Patient expressed understanding, and states she will pick up these medications.

## 2012-03-12 NOTE — Telephone Encounter (Signed)
Pt calls and states dr brown called her and told her he was starting 2 meds for her, fosamax and vitd, she states they were to be scripts, that the pharmacist told her one was wrong where the dosing was concerned and the other was otc and she states she cannot afford any otc's. She would like dr brown to call her back . Please call her at  [phone].

## 2012-04-09 NOTE — Progress Notes (Signed)
This encounter was created in error - please disregard.

## 2012-04-16 ENCOUNTER — Telehealth: Payer: Self-pay | Admitting: *Deleted

## 2012-04-16 NOTE — Telephone Encounter (Signed)
Pt called stating she needs to have all her teeth extracted and has home questions about the medication she is on.  Returned call to pt and no answer. Will wait for her to call back.  Pt # [phone]

## 2012-04-16 NOTE — Telephone Encounter (Signed)
Pt called back and the question is about fosamax interfering with the healing process. Pt states she will be admitted to hospital for the extractions. The DDS will be calling us.  She would like Dr Manson Passey to please call her at [phone]

## 2012-04-18 ENCOUNTER — Telehealth: Payer: Self-pay | Admitting: *Deleted

## 2012-04-18 NOTE — Telephone Encounter (Signed)
Pt calls and states she would like dr brown to call her RE: multiple tooth extraction Please call [phone]

## 2012-04-18 NOTE — Telephone Encounter (Signed)
Returned patient's phone call.  Patient states she has been seen by a dentist and needs several teeth extracted.  She noted apprehension about the procedure, stating that "you hear people get put to sleep and never wake up".  I listened with empathy, and provided emotional support.  She also noted that the dentist would be getting in touch with me prior to the procedure, though she was unclear why this may be needed (?preop clearance).  I checked my mailbox and currently do not have any correspondence regarding this matter.  The patient thanked me for my time.

## 2012-04-19 ENCOUNTER — Other Ambulatory Visit: Payer: Self-pay | Admitting: *Deleted

## 2012-04-21 MED ORDER — POLYETHYLENE GLYCOL 3350 POWD: 17.0000 g | Freq: Every day | Status: DC | PRN

## 2012-05-04 ENCOUNTER — Telehealth: Payer: Self-pay | Admitting: *Deleted

## 2012-05-04 NOTE — Telephone Encounter (Signed)
Pt checked to see if we have papers for clearence for dentist on pt. Found paper scan into Epic under media. Printed copy was handed to Dr Manson Passey. Pt aware and will address  an answer soon. Stanton Kidney Mate Alegria RN 05/04/12 1:50PM

## 2012-05-09 ENCOUNTER — Encounter: Payer: Self-pay | Admitting: Internal Medicine

## 2012-05-09 ENCOUNTER — Ambulatory Visit (INDEPENDENT_AMBULATORY_CARE_PROVIDER_SITE_OTHER): Payer: Medicare Other | Admitting: Internal Medicine

## 2012-05-09 VITALS — BP 108/77 | HR 103 | Temp 98.8°F | Ht 67.0 in | Wt 92.7 lb

## 2012-05-09 DIAGNOSIS — Z23 Encounter for immunization: Secondary | ICD-10-CM

## 2012-05-09 DIAGNOSIS — R109 Unspecified abdominal pain: Secondary | ICD-10-CM

## 2012-05-09 DIAGNOSIS — F419 Anxiety disorder, unspecified: Secondary | ICD-10-CM

## 2012-05-09 DIAGNOSIS — Z299 Encounter for prophylactic measures, unspecified: Secondary | ICD-10-CM

## 2012-05-09 DIAGNOSIS — Z419 Encounter for procedure for purposes other than remedying health state, unspecified: Secondary | ICD-10-CM

## 2012-05-09 DIAGNOSIS — F411 Generalized anxiety disorder: Secondary | ICD-10-CM

## 2012-05-09 DIAGNOSIS — Z720 Tobacco use: Secondary | ICD-10-CM

## 2012-05-09 DIAGNOSIS — J449 Chronic obstructive pulmonary disease, unspecified: Secondary | ICD-10-CM

## 2012-05-09 DIAGNOSIS — J4489 Other specified chronic obstructive pulmonary disease: Secondary | ICD-10-CM

## 2012-05-09 DIAGNOSIS — Z01818 Encounter for other preprocedural examination: Secondary | ICD-10-CM

## 2012-05-09 DIAGNOSIS — F17200 Nicotine dependence, unspecified, uncomplicated: Secondary | ICD-10-CM

## 2012-05-09 LAB — CBC WITH DIFFERENTIAL/PLATELET
Eosinophils Relative: 1 % (ref 0–5)
HCT: 44 % (ref 36.0–46.0)
Lymphocytes Relative: 33 % (ref 12–46)
Lymphs Abs: 2 10*3/uL (ref 0.7–4.0)
MCV: 94.4 fL (ref 78.0–100.0)
Monocytes Absolute: 0.4 10*3/uL (ref 0.1–1.0)
Neutro Abs: 3.5 10*3/uL (ref 1.7–7.7)
RBC: 4.66 MIL/uL (ref 3.87–5.11)
WBC: 5.9 10*3/uL (ref 4.0–10.5)

## 2012-05-09 LAB — COMPLETE METABOLIC PANEL WITH GFR
AST: 18 U/L (ref 0–37)
Alkaline Phosphatase: 79 U/L (ref 39–117)
BUN: 11 mg/dL (ref 6–23)
Creat: 0.62 mg/dL (ref 0.50–1.10)
GFR, Est Non African American: >89 mL/min
Total Bilirubin: 0.4 mg/dL (ref 0.3–1.2)

## 2012-05-09 LAB — APTT: aPTT: 31.4 seconds (ref 24–37)

## 2012-05-09 MED ORDER — OXYMORPHONE HCL 10 MG PO TABS: 10.0000 mg | ORAL_TABLET | ORAL | Status: DC | PRN

## 2012-05-09 MED ORDER — FENTANYL 100 MCG/HR TD PT72: 1.0000 | MEDICATED_PATCH | TRANSDERMAL | Status: DC

## 2012-05-09 MED ORDER — ALPRAZOLAM 2 MG PO TABS: 2.0000 mg | ORAL_TABLET | Freq: Three times a day (TID) | ORAL | Status: DC | PRN

## 2012-05-09 MED ORDER — ALBUTEROL SULFATE HFA 108 (90 BASE) MCG/ACT IN AERS: 1.0000 | INHALATION_SPRAY | RESPIRATORY_TRACT | Status: DC | PRN

## 2012-05-09 NOTE — Assessment & Plan Note (Signed)
The patient has a history of anxiety, and continues to note frequent panic attacks, but remains resistant to evaluation by a Psychiatrist or Clinical Psychologist -continue Xanax -continue to offer psych services, as I believe patient would greatly benefit from these services

## 2012-05-09 NOTE — Assessment & Plan Note (Signed)
COPD at baseline -continue symbicort, spiriva -continue albuterol prn

## 2012-05-09 NOTE — Assessment & Plan Note (Signed)
The patient presents today for pre-operative evaluation before planned removal of 16 teeth under general anesthesia.  History and Physical was performed as above.  The planned procedure is a low-risk surgery.  The patient's Revised Goldman Cardiac Risk Index score is 0, representing a 0.4% risk of cardiac death, nonfatal MI, and nonfatal cardiac arrest (the lowest possible category).  Based on the ACC/AHA guidelines, the patient does not require further cardiac work-up, such as EKG or stress test.  The patient notes that she can undertake activity equivalent to 4 mets (walking up a hill), though with some shortness of breath after completing this activity.  The patient's greatest risk factor for perioperative complication is her COPD, GOLD stage 3, increases her risk for perioperative pulmonary complications.  If intubation and general anesthesia could be avoided, the risk for perioperative pulmonary complications would likely decrease, though this may not be possible.  The patient was advised to quit smoking with nicotine replacement products, and was offered further counseling or medications if her initial attempt is unsuccessful.  She was given a flu shot today.  The patient is on chronic opiate and benzodiazepine treatment for chronic pain and anxiety.  We recommend continuing these medications (xanax, opana, fentanyl) in the periop period to avoid withdrawal.  However, the dose of general anesthesia used during this procedure should be carefully determined, taking the use of these medications under consideration.  The patient may discontinue Fosamax in anticipation of this surgery, and can resume this medication after surgery (ok to wait a couple of weeks after surgery to restart Fosamax if desired by surgeon).  In summary, the patient is undergoing low-risk surgery.  The risks of surgery include an increased risk of perioperative pulmonary complications due to COPD, as well as the general risks associated  with general anesthesia and this procedure.  The patient's opiates and benzodiazepines should be continued in the perioperative period.  The patient can discontinue Fosamax until the surgery takes place, then resume after surgery.  The patient was advised to quit smoking, and was given a flu shot.

## 2012-05-09 NOTE — Patient Instructions (Addendum)
You were seen today for a pre-operative evaluation for your dental procedure.  I will be sending today's note and labs to your dentist, as requested. -It is important to quit smoking from now until the time of your surgery, to decrease your risk of lung complications from the procedure.  As discussed, try the nicotine gum you have at home, but if you are having difficulty quitting, please call our office and we will set up a meeting with our Social Worker to discuss smoking cessation.  You received the Flu shot today.  You may experience some arm soreness for the next 1-2 days, which is normal.  Please return for your regular follow-up with me in 3-4 months

## 2012-05-09 NOTE — Assessment & Plan Note (Signed)
Patient currently desires to quit smoking, and was advised to do so.  Patient declines meeting with social worker at this time to discuss smoking cessation -will start with nicotine gum -if unsuccessful, can try nicotine patch or chantix -Child psychotherapist available for further counseling if patient desires

## 2012-05-09 NOTE — Progress Notes (Signed)
HPI The patient is a 51 y.o. yo female with a history of COPD (GOLD stage 3), chronic pain, and peridontitis, presenting for a pre-op evaluation for an upcoming dental procedure.  The patient presents for evaluation for pre-op clearance for a dental procedure.  The patient has a history of COPD (GOLD stage 3), anxiety, depression, osteoporosis, and chronic back pain.  The patient notes no chest pain, but occasionally experiences discrete episodes of chest tightness, anxiety, and palpitations, attributed to panic attacks.  The patient notes some baseline dyspnea/SOB, using oxygen at night.  The patient is able to walk around her house without difficulty, and do housework with minimal difficulty.  She notes SOB after walking up a flight of stairs or up a hill, and believes she could not jog or participate in sports due to SOB.  She notes no wheezing or coughing.  The patient notes continued anxiety.  She notes one panic attack last week, and typically has panic attacks as frequently as once per day.  She notes that her TID xanax helps with her symptoms.  The patient states she is not interested in seeing a psychiatrist or psychologist at this time, which she has also noted on several occasions in the past.  The patient is currently still smoking 1 PPD.  She states that she has nicotine gum at home, but has not been using it.  She notes cravings as her major barrier to smoking cessation.  ROS: General: no fevers, chills, +poor appetite (chronic) Skin: no rash HEENT: no blurry vision, hearing changes, sore throat Pulm: no coughing, wheezing CV: no chest pain, palpitations Abd: no abdominal pain, nausea/vomiting, diarrhea/constipation GU: no dysuria, hematuria, polyuria Ext: no arthralgias, myalgias Neuro: no weakness, numbness, or tingling Psych: +anxiety/depression  Filed Vitals:   05/09/12 1537  BP: 108/77  Pulse: 103  Temp: 98.8 F (37.1 C)    PEX General: alert, cooperative, appears  mildly anxious HEENT: PERRL, EOMI, oropharynx non-erythematous, multiple teeth with Dustine Bertini-black discoloration Neck: supple, no lymphadenopathy, JVD, or carotid bruits Lungs: clear to ascultation bilaterally, normal work of respiration, no wheezes, rales, ronchi Heart: regular rate and rhythm, no murmurs, gallops, or rubs Abdomen: soft, non-tender, non-distended, normal bowel sounds Extremities: no cyanosis, clubbing, or edema Neurologic: alert & oriented X3, slow speech, cranial nerves II-XII intact, strength 5/5 throughout, sensation intact to light touch  Current Outpatient Prescriptions on File Prior to Visit  Medication Sig Dispense Refill  . alendronate (FOSAMAX) 70 MG tablet Take 1 tablet (70 mg total) by mouth every 7 (seven) days. Take with a full glass of water on an empty stomach.  4 tablet  11  . ALPRAZolam (XANAX) 2 MG tablet Take 1 tablet (2 mg total) by mouth 3 (three) times daily as needed for anxiety.  90 tablet  2  . Calcium Carbonate-Vitamin D (CALCIUM 600/VITAMIN D) 600-400 MG-UNIT per tablet Take 2 tablets by mouth daily.  60 tablet  11  . esomeprazole (NEXIUM) 40 MG capsule Take 1 capsule (40 mg total) by mouth at bedtime.  30 capsule  6  . fentaNYL (DURAGESIC - DOSED MCG/HR) 100 MCG/HR Place 1 patch (100 mcg total) onto the skin every other day.  15 patch  0  . oxymorphone (OPANA) 10 MG tablet Take 1 tablet (10 mg total) by mouth every 4 (four) hours as needed for pain.  120 tablet  0  . Polyethylene Glycol 3350 POWD Take 17 g by mouth daily as needed. In 10 ounces of water once or  twice daily for constipation. Titrate as needed  1 Bottle  11  . SYMBICORT 160-4.5 MCG/ACT inhaler ONE PUFF TWICE DAILY AS DIRECTED  1 Inhaler  11  . tiotropium (SPIRIVA) 18 MCG inhalation capsule Place 18 mcg into inhaler and inhale daily.        Current Facility-Administered Medications on File Prior to Visit  Medication Dose Route Frequency Provider Last Rate Last Dose  . cyanocobalamin  ((VITAMIN B-12)) injection 1,000 mcg  1,000 mcg Intramuscular Q30 days Edsel Petrin, DO   1,000 mcg at 02/24/11 1218    Assessment/Plan

## 2012-05-10 ENCOUNTER — Encounter: Payer: Self-pay | Admitting: Internal Medicine

## 2012-05-15 ENCOUNTER — Telehealth: Payer: Self-pay | Admitting: *Deleted

## 2012-05-15 NOTE — Telephone Encounter (Signed)
Pt calls and states she is awaiting approval of marinol by her insurance company, i don't see it on her med list and i am almost sure insurance will not approve it. Did you want her to use marinol?

## 2012-05-15 NOTE — Telephone Encounter (Signed)
i spoke w/ dr Midwife and the pt, i explained that the insurance company denied paying marinol, pt was agreeable

## 2012-05-21 ENCOUNTER — Other Ambulatory Visit (HOSPITAL_COMMUNITY): Payer: Self-pay | Admitting: Oral Surgery

## 2012-05-30 ENCOUNTER — Telehealth: Payer: Self-pay | Admitting: *Deleted

## 2012-05-30 NOTE — Telephone Encounter (Signed)
Pt calls and states she switched insurance carriers- from [place] to [employer] and they will pay for marinol. i also rec'd a call from [facility] for [facility] script with a diag code of an eating disorder per the pt, i do not see a diag code for an eating disorder. As of 7/3 insurance refused after going for a review unless pt had aids or cancer. If you would like to send a new script please add the diag code

## 2012-05-31 ENCOUNTER — Telehealth: Payer: Self-pay | Admitting: *Deleted

## 2012-05-31 MED ORDER — DRONABINOL 2.5 MG PO CAPS: 2.5000 mg | ORAL_CAPSULE | Freq: Two times a day (BID) | ORAL | Status: DC

## 2012-05-31 NOTE — Telephone Encounter (Signed)
Opened in error

## 2012-05-31 NOTE — Telephone Encounter (Signed)
Called [facility].  Called in order for prescription, and gave diagnosis codes 783.0, 783.22, 783.21.  Pharmacist says he will check with patient's insurance, and call us back to let us know whether this will be possible.  He brought up 2 other good points - her insurance may change on the 1st of the month; if so he will hold on to the prescription and re-try tomorrow.  Also this prescription may be regulated under Medicare part B, which has specific pharmacies, and [facility] is not a part B pharmacy.  Will await return call.

## 2012-06-05 ENCOUNTER — Encounter (HOSPITAL_COMMUNITY): Payer: Self-pay | Admitting: Pharmacy Technician

## 2012-06-06 ENCOUNTER — Encounter: Payer: Self-pay | Admitting: Internal Medicine

## 2012-06-06 ENCOUNTER — Ambulatory Visit (INDEPENDENT_AMBULATORY_CARE_PROVIDER_SITE_OTHER): Payer: Medicare Other | Admitting: Internal Medicine

## 2012-06-06 VITALS — BP 108/69 | HR 87 | Temp 97.9°F | Ht 67.0 in | Wt 95.4 lb

## 2012-06-06 DIAGNOSIS — F17200 Nicotine dependence, unspecified, uncomplicated: Secondary | ICD-10-CM

## 2012-06-06 DIAGNOSIS — W19XXXA Unspecified fall, initial encounter: Secondary | ICD-10-CM | POA: Insufficient documentation

## 2012-06-06 DIAGNOSIS — R490 Dysphonia: Secondary | ICD-10-CM | POA: Insufficient documentation

## 2012-06-06 DIAGNOSIS — Z720 Tobacco use: Secondary | ICD-10-CM

## 2012-06-06 MED ORDER — GUAIFENESIN-DM 100-10 MG/5ML PO SYRP: 5.0000 mL | ORAL_SOLUTION | Freq: Three times a day (TID) | ORAL | Status: DC | PRN

## 2012-06-06 MED ORDER — VARENICLINE TARTRATE 0.5 MG PO TABS: ORAL_TABLET | ORAL | Status: DC

## 2012-06-06 MED ORDER — VARENICLINE TARTRATE 1 MG PO TABS: 1.0000 mg | ORAL_TABLET | Freq: Two times a day (BID) | ORAL | Status: DC

## 2012-06-06 NOTE — Assessment & Plan Note (Addendum)
Patient continues to desire to quit smoking, but has been unsuccessful with nicotine gum or patches.  We discuss other modalities of smoking cessation.  She cites concerns about side effects from medications, but we discuss lung cancer as a side effect of smoking. -will try Chantix x3 months.  Patient given hand-out on medication side effects -I would still prefer for the patient to quit smoking prior to her tooth extraction procedure

## 2012-06-06 NOTE — Assessment & Plan Note (Signed)
The patient notes a 6-day history of hoarseness, likely due to viral URI.  However, given patient's smoking history, laryngeal/esophageal cancer is also on the differential.  The patient has a history of weight loss over the last 2 years with no clear etiology, but she has actually started to regain weight over the last month after cutting back on tobacco usage. -supportive therapy -if symptoms do not resolve within 1-2 weeks, will consider further work-up for mass

## 2012-06-06 NOTE — Patient Instructions (Signed)
Your horseness is likely caused by an upper respiratory infection.  Symptoms should resolve within 1-2 weeks.  If symptoms do not resolve, call our office; we may need to schedule a CT scan of your neck. -I've sent in a prescription for robitussin cough syrup  For smoking cessation, we are prescribing Chantix. -start by taking 0.5 mg once per day for 3 days -then take 0.5 mg twice per day for 4 days -then take 1 mg twice per day for 3 months.  Please return for a follow-up visit in 2-3 months.

## 2012-06-06 NOTE — Assessment & Plan Note (Signed)
The patient notes a fall 1 week ago on uneven ground with no concerning associated symptoms.  Likely mechanical in nature, though may be related to high dose narcotics that the patient is on for chronic pain syndrome. -continue to monitor -consider decreasing pain medications if episode recurs

## 2012-06-06 NOTE — Progress Notes (Signed)
HPI The patient is a 51 y.o. female with a history of COPD, chronic pain, osteoporosis, presenting for a routine follow-up appointment.  The patient notes a 6-day history of hoarseness, with associated symptoms of dry cough and rhinorrhea.  No sore throat, fevers, chills, nausea, or vomiting.  No sick contacts.  Symptoms appear to be stable over this time period.  The patient notes a fall when walking back from her mailbox 1 week ago.  She notes no LH, dizziness, auditory/visual disturbances, cp, palpitations, or SOB prior to fall.  She notes no LOC following the fall.  She does not think that she tripped on anything, but notes that her driveway is gravel with an incline.  She notes no other falls recently.  The patient notes that she has cut back on her cigarette usage to a little over 1/2 pack per day, but has not been able to quit completely.  She does not note much success with nicotine patches or gum.  She has gained 3 pounds since her last visit.  We discuss smoking cessation modalities, but she is concerned about the side effects of medications.  ROS: General: no fevers, chills, changes in appetite Skin: no rash HEENT: no blurry vision, hearing changes Pulm: no dyspnea, coughing, wheezing CV: no chest pain, palpitations, shortness of breath Abd: no abdominal pain, nausea/vomiting, diarrhea/constipation GU: no dysuria, hematuria, polyuria Ext: no arthralgias, myalgias Neuro: no weakness, numbness, or tingling  Filed Vitals:   06/06/12 1508  BP: 108/69  Pulse: 87  Temp: 97.9 F (36.6 C)    PEX General: alert, cooperative, and in no apparent distress HEENT: pupils equal round and reactive to light, vision grossly intact, posterior oropharynx mildly erythematous Neck: supple, 1 left and 1 right anterior cervical enlarged non-tender lymph node Lungs: clear to ascultation bilaterally, normal work of respiration, no wheezes, rales, ronchi Heart: regular rate and rhythm, no murmurs,  gallops, or rubs Abdomen: soft, non-tender, non-distended, normal bowel sounds Extremities: no cyanosis, clubbing, or edema Neurologic: alert & oriented X3, cranial nerves II-XII intact, strength grossly intact, sensation intact to light touch  Current Outpatient Prescriptions on File Prior to Visit  Medication Sig Dispense Refill  . alprazolam (XANAX) 2 MG tablet Take 1 tablet (2 mg total) by mouth 3 (three) times daily as needed for anxiety.  90 tablet  2  . Calcium Carbonate-Vitamin D (CALCIUM 600/VITAMIN D) 600-400 MG-UNIT per tablet Take 2 tablets by mouth daily.  60 tablet  11  . esomeprazole (NEXIUM) 10 MG packet Take 10 mg by mouth daily before breakfast.      . esomeprazole (NEXIUM) 40 MG capsule Take 40 mg by mouth daily before breakfast.      . fentaNYL (DURAGESIC - DOSED MCG/HR) 100 MCG/HR Place 1 patch (100 mcg total) onto the skin every other day.  15 patch  0  . oxymorphone (OPANA) 10 MG tablet Take 1 tablet (10 mg total) by mouth every 4 (four) hours as needed for pain.  120 tablet  0  . oxymorphone (OPANA) 10 MG tablet Take 10 mg by mouth 4 (four) times daily. Take four times a day per patient      . Polyethylene Glycol 3350 POWD Take 17 g by mouth daily as needed. In 10 ounces of water once or twice daily for constipation. Titrate as needed  1 Bottle  11  . SYMBICORT 160-4.5 MCG/ACT inhaler ONE PUFF TWICE DAILY AS DIRECTED  1 Inhaler  11   Current Facility-Administered Medications on File  Prior to Visit  Medication Dose Route Frequency Provider Last Rate Last Dose  . cyanocobalamin ((VITAMIN B-12)) injection 1,000 mcg  1,000 mcg Intramuscular Q30 days Edsel Petrin, DO   1,000 mcg at 02/24/11 1218    Assessment/Plan

## 2012-06-08 ENCOUNTER — Telehealth: Payer: Self-pay | Admitting: *Deleted

## 2012-06-08 NOTE — Telephone Encounter (Signed)
A gentleman identifying himself as adam from [employer] and stating he was assisting the pt and dr brown needed to complete, i called the number and answered the questions, a review board will send an answer within 24hrs,  Georgia [phone]

## 2012-06-08 NOTE — Telephone Encounter (Signed)
Pt calls and states she found someone at a drug store that says all she needs to get marinol is for you to give them a prior auth, i explained to the pt that it was not something you the physician approves it is something that we ask the insurance company to consider, and that the clinic had exhausted the appeals for this medicine.  She would like for you to call her asap.

## 2012-06-11 NOTE — Telephone Encounter (Signed)
I returned the patient's phone call.  She notes feelings of anxiety, getting "mad" at people, and wanting to "throw my medicine in the trash" for the last 2 days.  She attributes these feelings to the new medication, Chantix, which was recently started 3 days.  She again notes that she would just like to try smoking cessation by "praying about it".  I suggested the patient re-try the nicotine gum, to which she is amenable.  She states she will start with some nicotine gum packets she has at home from her last smoking cessation attempt, and will call us for refills if she finds these helpful.  The patient was informed to stop Chantix now, due to side effects.  In terms of the Marinol medication, I again called [facility], who stated that they could provide the medication only with a prior authorization.  I called [facility] for prior authorization again, as myself and other staff members of our clinic have done several times already, and they again informed me that the diagnosis codes that the patient has are not adequate to approve this medication, and that her prior authorization is again denied.  I informed the patient of this, and asked her to focus on the fact that she has now started to re-gain some weight after cutting back on her smoking.  I believe full smoking cessation will help most to prevent weight loss.  The patient reports that her symptoms of hoarseness have improved significantly, and nearly resolved.

## 2012-06-12 ENCOUNTER — Telehealth: Payer: Self-pay | Admitting: *Deleted

## 2012-06-12 NOTE — Telephone Encounter (Signed)
Ms Damas called and left this message to call jeremy, i called and gave him the pa # and ask him to call the PA dept at [employer] for more info

## 2012-06-13 ENCOUNTER — Encounter: Payer: Medicare Other | Admitting: Internal Medicine

## 2012-06-13 ENCOUNTER — Encounter (HOSPITAL_COMMUNITY)
Admission: RE | Admit: 2012-06-13 | Discharge: 2012-06-13 | Disposition: A | Discharge status: Home / Self Care | Payer: PRIVATE HEALTH INSURANCE | Source: Ambulatory Visit | Attending: Oral Surgery | Admitting: Oral Surgery

## 2012-06-13 ENCOUNTER — Ambulatory Visit (HOSPITAL_COMMUNITY)
Admission: RE | Admit: 2012-06-13 | Discharge: 2012-06-13 | Disposition: A | Discharge status: Home / Self Care | Payer: PRIVATE HEALTH INSURANCE | Source: Ambulatory Visit | Attending: Anesthesiology | Admitting: Anesthesiology

## 2012-06-13 ENCOUNTER — Other Ambulatory Visit (HOSPITAL_COMMUNITY): Payer: Self-pay | Admitting: Oral Surgery

## 2012-06-13 ENCOUNTER — Encounter (HOSPITAL_COMMUNITY): Payer: Self-pay

## 2012-06-13 DIAGNOSIS — Z01818 Encounter for other preprocedural examination: Secondary | ICD-10-CM | POA: Insufficient documentation

## 2012-06-13 HISTORY — DX: Fibromyalgia: M79.7

## 2012-06-13 LAB — COMPREHENSIVE METABOLIC PANEL
ALT: 20 U/L (ref 0–35)
Albumin: 4.2 g/dL (ref 3.5–5.2)
Alkaline Phosphatase: 78 U/L (ref 39–117)
CO2: 31 mEq/L (ref 19–32)
Calcium: 10 mg/dL (ref 8.4–10.5)
Creatinine, Ser: 0.6 mg/dL (ref 0.50–1.10)
Glucose, Bld: 87 mg/dL (ref 70–99)
Total Bilirubin: 0.4 mg/dL (ref 0.3–1.2)

## 2012-06-13 LAB — CBC
MCH: 32.3 pg (ref 26.0–34.0)
MCHC: 34.1 g/dL (ref 30.0–36.0)
MCV: 94.7 fL (ref 78.0–100.0)
Platelets: 261 10*3/uL (ref 150–400)
RDW: 13.5 % (ref 11.5–15.5)
WBC: 8.3 10*3/uL (ref 4.0–10.5)

## 2012-06-13 NOTE — Pre-Procedure Instructions (Addendum)
20 CHIRSTINA METTEN  06/13/2012   Your procedure is scheduled on:  06/18/12  Report to [facility] at 800 AM.  Call this number if you have problems the morning of surgery: [phone]   Remember:   Do not eat food or drink:After Midnight.    Take these medicines the morning of surgery with A SIP OF WATER: inhaler, xanax, nexium,duragesic patch,pain med,  Do not wear jewelry, make-up or nail polish.  Do not wear lotions, powders, or perfumes. .  Do not shave 48 hours prior to surgery. Men may shave face and neck.  Do not bring valuables to the hospital.  Contacts, dentures or bridgework may not be worn into surgery.  Leave suitcase in the car. After surgery it may be brought to your room.  For patients admitted to the hospital, checkout time is 11:00 AM the day of discharge.   Patients discharged the day of surgery will not be allowed to drive home.  Name and phone number of your driver: Lucita Lora [phone]  Special Instructions: Shower using CHG 2 nights before surgery and the night before surgery.  If you shower the day of surgery use CHG.  Use special wash - you have one bottle of CHG for all showers.  You should use approximately 1/3 of the bottle for each shower.   Please read over the following fact sheets that you were given: Pain Booklet, Coughing and Deep Breathing and Surgical Site Infection Prevention

## 2012-06-18 ENCOUNTER — Encounter (HOSPITAL_COMMUNITY): Payer: Self-pay | Admitting: *Deleted

## 2012-06-18 ENCOUNTER — Ambulatory Visit (HOSPITAL_COMMUNITY)
Admission: RE | Admit: 2012-06-18 | Discharge: 2012-06-18 | Disposition: A | Discharge status: Home / Self Care | Payer: PRIVATE HEALTH INSURANCE | Source: Ambulatory Visit | Attending: Oral Surgery | Admitting: Oral Surgery

## 2012-06-18 ENCOUNTER — Encounter (HOSPITAL_COMMUNITY): Payer: Self-pay | Admitting: Anesthesiology

## 2012-06-18 ENCOUNTER — Encounter (HOSPITAL_COMMUNITY)
Admission: RE | Disposition: A | Discharge status: Home / Self Care | Payer: Self-pay | Source: Ambulatory Visit | Attending: Oral Surgery

## 2012-06-18 ENCOUNTER — Ambulatory Visit (HOSPITAL_COMMUNITY): Payer: PRIVATE HEALTH INSURANCE | Admitting: Anesthesiology

## 2012-06-18 DIAGNOSIS — W19XXXA Unspecified fall, initial encounter: Secondary | ICD-10-CM

## 2012-06-18 DIAGNOSIS — J4489 Other specified chronic obstructive pulmonary disease: Secondary | ICD-10-CM

## 2012-06-18 DIAGNOSIS — F329 Major depressive disorder, single episode, unspecified: Secondary | ICD-10-CM

## 2012-06-18 DIAGNOSIS — Z0000 Encounter for general adult medical examination without abnormal findings: Secondary | ICD-10-CM

## 2012-06-18 DIAGNOSIS — F3289 Other specified depressive episodes: Secondary | ICD-10-CM

## 2012-06-18 DIAGNOSIS — F419 Anxiety disorder, unspecified: Secondary | ICD-10-CM

## 2012-06-18 DIAGNOSIS — K219 Gastro-esophageal reflux disease without esophagitis: Secondary | ICD-10-CM

## 2012-06-18 DIAGNOSIS — G894 Chronic pain syndrome: Secondary | ICD-10-CM

## 2012-06-18 DIAGNOSIS — K029 Dental caries, unspecified: Secondary | ICD-10-CM

## 2012-06-18 DIAGNOSIS — J449 Chronic obstructive pulmonary disease, unspecified: Secondary | ICD-10-CM

## 2012-06-18 DIAGNOSIS — K089 Disorder of teeth and supporting structures, unspecified: Secondary | ICD-10-CM | POA: Insufficient documentation

## 2012-06-18 DIAGNOSIS — M278 Other specified diseases of jaws: Secondary | ICD-10-CM | POA: Insufficient documentation

## 2012-06-18 DIAGNOSIS — R490 Dysphonia: Secondary | ICD-10-CM

## 2012-06-18 DIAGNOSIS — E538 Deficiency of other specified B group vitamins: Secondary | ICD-10-CM

## 2012-06-18 DIAGNOSIS — M810 Age-related osteoporosis without current pathological fracture: Secondary | ICD-10-CM

## 2012-06-18 DIAGNOSIS — R636 Underweight: Secondary | ICD-10-CM

## 2012-06-18 DIAGNOSIS — Z720 Tobacco use: Secondary | ICD-10-CM

## 2012-06-18 HISTORY — PX: MULTIPLE EXTRACTIONS WITH ALVEOLOPLASTY: SHX5342

## 2012-06-18 LAB — GLUCOSE, CAPILLARY: Glucose-Capillary: 99 mg/dL (ref 70–99)

## 2012-06-18 SURGERY — MULTIPLE EXTRACTION WITH ALVEOLOPLASTY
Anesthesia: General | Site: Mouth | Laterality: Bilateral | Wound class: Clean Contaminated

## 2012-06-18 MED ORDER — LIDOCAINE-EPINEPHRINE 2 %-1:100000 IJ SOLN
INTRAMUSCULAR | Status: AC
  Filled 2012-06-18: qty 1

## 2012-06-18 MED ORDER — PROPOFOL 10 MG/ML IV BOLUS
INTRAVENOUS | Status: DC | PRN
  Administered 2012-06-18: 150 mg via INTRAVENOUS

## 2012-06-18 MED ORDER — LACTATED RINGERS IV SOLN
INTRAVENOUS | Status: DC
  Administered 2012-06-18: 11:00:00 via INTRAVENOUS

## 2012-06-18 MED ORDER — OXYCODONE-ACETAMINOPHEN 5-325 MG PO TABS: 1.0000 | ORAL_TABLET | ORAL | Status: DC | PRN

## 2012-06-18 MED ORDER — ROCURONIUM BROMIDE 100 MG/10ML IV SOLN
INTRAVENOUS | Status: DC | PRN
  Administered 2012-06-18: 30 mg via INTRAVENOUS

## 2012-06-18 MED ORDER — OXYCODONE HCL 5 MG PO TABS: 5.0000 mg | ORAL_TABLET | Freq: Once | ORAL | Status: DC | PRN

## 2012-06-18 MED ORDER — OXYCODONE HCL 5 MG/5ML PO SOLN: 5.0000 mg | Freq: Once | ORAL | Status: DC | PRN

## 2012-06-18 MED ORDER — SODIUM CHLORIDE 0.9 % IR SOLN
Status: DC | PRN
  Administered 2012-06-18: 1

## 2012-06-18 MED ORDER — FENTANYL CITRATE 0.05 MG/ML IJ SOLN
INTRAMUSCULAR | Status: DC | PRN
  Administered 2012-06-18: 150 ug via INTRAVENOUS

## 2012-06-18 MED ORDER — MIDAZOLAM HCL 5 MG/5ML IJ SOLN
INTRAMUSCULAR | Status: DC | PRN
  Administered 2012-06-18: 2 mg via INTRAVENOUS

## 2012-06-18 MED ORDER — GLYCOPYRROLATE 0.2 MG/ML IJ SOLN
INTRAMUSCULAR | Status: DC | PRN
  Administered 2012-06-18: 0.6 mg via INTRAVENOUS

## 2012-06-18 MED ORDER — OXYMETAZOLINE HCL 0.05 % NA SOLN
NASAL | Status: AC
  Filled 2012-06-18: qty 15

## 2012-06-18 MED ORDER — ONDANSETRON HCL 4 MG/2ML IJ SOLN
INTRAMUSCULAR | Status: DC | PRN
  Administered 2012-06-18: 4 mg via INTRAVENOUS

## 2012-06-18 MED ORDER — CEFAZOLIN SODIUM 1-5 GM-% IV SOLN
INTRAVENOUS | Status: DC | PRN
  Administered 2012-06-18: 1 g via INTRAVENOUS

## 2012-06-18 MED ORDER — PHENYLEPHRINE HCL 10 MG/ML IJ SOLN
INTRAMUSCULAR | Status: DC | PRN
  Administered 2012-06-18: 40 ug via INTRAVENOUS

## 2012-06-18 MED ORDER — CEFAZOLIN SODIUM 1-5 GM-% IV SOLN
INTRAVENOUS | Status: AC
  Filled 2012-06-18: qty 50

## 2012-06-18 MED ORDER — ONDANSETRON HCL 4 MG/2ML IJ SOLN: 4.0000 mg | Freq: Once | INTRAMUSCULAR | Status: DC | PRN

## 2012-06-18 MED ORDER — MEPERIDINE HCL 25 MG/ML IJ SOLN: 6.2500 mg | INTRAMUSCULAR | Status: DC | PRN

## 2012-06-18 MED ORDER — LIDOCAINE HCL (CARDIAC) 20 MG/ML IV SOLN
INTRAVENOUS | Status: DC | PRN
  Administered 2012-06-18: 50 mg via INTRAVENOUS

## 2012-06-18 MED ORDER — NEOSTIGMINE METHYLSULFATE 1 MG/ML IJ SOLN
INTRAMUSCULAR | Status: DC | PRN
  Administered 2012-06-18: 3 mg via INTRAVENOUS

## 2012-06-18 MED ORDER — HYDROMORPHONE HCL PF 1 MG/ML IJ SOLN: 0.2500 mg | INTRAMUSCULAR | Status: DC | PRN

## 2012-06-18 MED ORDER — LIDOCAINE-EPINEPHRINE 2 %-1:100000 IJ SOLN
INTRAMUSCULAR | Status: DC | PRN
  Administered 2012-06-18: 17 mL

## 2012-06-18 MED ORDER — LACTATED RINGERS IV SOLN
INTRAVENOUS | Status: DC | PRN
  Administered 2012-06-18: 12:00:00 via INTRAVENOUS

## 2012-06-18 SURGICAL SUPPLY — 27 items
BUR CROSS CUT FISSURE 1.6 (BURR) ×2 IMPLANT
BUR EGG ELITE 4.0 (BURR) ×2 IMPLANT
CANISTER SUCTION 2500CC (MISCELLANEOUS) ×2 IMPLANT
CLOTH BEACON ORANGE TIMEOUT ST (SAFETY) ×2 IMPLANT
COVER SURGICAL LIGHT HANDLE (MISCELLANEOUS) ×2 IMPLANT
CRADLE DONUT ADULT HEAD (MISCELLANEOUS) ×2 IMPLANT
DECANTER SPIKE VIAL GLASS SM (MISCELLANEOUS) ×2 IMPLANT
GAUZE PACKING FOLDED 2  STR (GAUZE/BANDAGES/DRESSINGS) ×1
GAUZE PACKING FOLDED 2 STR (GAUZE/BANDAGES/DRESSINGS) ×1 IMPLANT
GLOVE BIO SURGEON STRL SZ 6.5 (GLOVE) ×2 IMPLANT
GLOVE BIO SURGEON STRL SZ7.5 (GLOVE) ×2 IMPLANT
GLOVE BIOGEL PI IND STRL 7.0 (GLOVE) ×1 IMPLANT
GLOVE BIOGEL PI INDICATOR 7.0 (GLOVE) ×1
GOWN STRL NON-REIN LRG LVL3 (GOWN DISPOSABLE) ×2 IMPLANT
GOWN STRL REIN XL XLG (GOWN DISPOSABLE) ×2 IMPLANT
KIT BASIN OR (CUSTOM PROCEDURE TRAY) ×2 IMPLANT
KIT ROOM TURNOVER OR (KITS) ×2 IMPLANT
NEEDLE 22X1 1/2 (OR ONLY) (NEEDLE) ×2 IMPLANT
NS IRRIG 1000ML POUR BTL (IV SOLUTION) ×2 IMPLANT
PAD ARMBOARD 7.5X6 YLW CONV (MISCELLANEOUS) ×4 IMPLANT
SUT CHROMIC 3 0 PS 2 (SUTURE) ×2 IMPLANT
SYR CONTROL 10ML LL (SYRINGE) ×2 IMPLANT
TOWEL OR 17X26 10 PK STRL BLUE (TOWEL DISPOSABLE) ×2 IMPLANT
TRAY ENT MC OR (CUSTOM PROCEDURE TRAY) ×2 IMPLANT
TUBING IRRIGATION (MISCELLANEOUS) ×2 IMPLANT
WATER STERILE IRR 1000ML POUR (IV SOLUTION) IMPLANT
YANKAUER SUCT BULB TIP NO VENT (SUCTIONS) ×2 IMPLANT

## 2012-06-18 NOTE — Op Note (Addendum)
06/18/2012  12:48 PM  PATIENT:  [name]  51 y.o. female  PRE-OPERATIVE DIAGNOSIS:  Nonrestorable teeth #'s 4, 5, 6, 7, 8, 9, 10, 11, 12, 13, 19, 20, 21, 22, 23, 24, 25, 26, 27, 28, 29, 31  POST-OPERATIVE DIAGNOSIS:  SAME + Right maqndibulor lingual torus  PROCEDURE:  Procedure(s): MULTIPLE EXTRACTION teeth #'s 4, 5, 6, 7, 8, 9, 10, 11, 12, 13, 19, 20, 21, 22, 23, 24, 25, 26, 27, 28, 29, 31  WITH ALVEOLOPLASTY, Removal right mandibular lingual torus.  SURGEON:  Surgeon(s): Georgia Lopes, DDS  ANESTHESIA:   local and general  EBL:  minimal  DRAINS: none   SPECIMEN:  No Specimen  COUNTS:  YES  PLAN OF CARE: Discharge to home after PACU  PATIENT DISPOSITION:  PACU - hemodynamically stable.   PROCEDURE DETAILS: Dictation # [id number] Addendum # [id number] Georgia Lopes, DMD 06/18/2012 12:48 PM

## 2012-06-18 NOTE — Anesthesia Preprocedure Evaluation (Signed)
Anesthesia Evaluation  Patient identified by MRN, date of birth, ID band Patient awake    Reviewed: Allergy & Precautions, H&P , NPO status , Patient's Chart, lab work & pertinent test results  Airway Mallampati: I TM Distance: >3 FB Neck ROM: Full    Dental   Pulmonary          Cardiovascular     Neuro/Psych Anxiety Depression    GI/Hepatic   Endo/Other    Renal/GU      Musculoskeletal   Abdominal   Peds  Hematology   Anesthesia Other Findings   Reproductive/Obstetrics                           Anesthesia Physical Anesthesia Plan  ASA: II  Anesthesia Plan: General   Post-op Pain Management:    Induction: Intravenous  Airway Management Planned: Nasal ETT  Additional Equipment:   Intra-op Plan:   Post-operative Plan: Extubation in OR  Informed Consent: I have reviewed the patients History and Physical, chart, labs and discussed the procedure including the risks, benefits and alternatives for the proposed anesthesia with the patient or authorized representative who has indicated his/her understanding and acceptance.     Plan Discussed with: CRNA and Surgeon  Anesthesia Plan Comments:         Anesthesia Quick Evaluation  

## 2012-06-18 NOTE — Anesthesia Postprocedure Evaluation (Signed)
Anesthesia Post Note  Patient: [name]  Procedure(s) Performed: Procedure(s) (LRB): MULTIPLE EXTRACION WITH ALVEOLOPLASTY (Bilateral)  Anesthesia type: general  Patient location: PACU  Post pain: Pain level controlled  Post assessment: Patient's Cardiovascular Status Stable  Last Vitals:  Filed Vitals:   06/18/12 1400  BP:   Pulse: 106  Temp:   Resp: 18    Post vital signs: Reviewed and stable  Level of consciousness: sedated  Complications: No apparent anesthesia complications

## 2012-06-18 NOTE — Transfer of Care (Signed)
Immediate Anesthesia Transfer of Care Note  Patient: [name]  Procedure(s) Performed: Procedure(s) (LRB) with comments: MULTIPLE EXTRACION WITH ALVEOLOPLASTY (Bilateral)  Patient Location: PACU  Anesthesia Type:General  Level of Consciousness: awake and alert   Airway & Oxygen Therapy: Patient Spontanous Breathing and Patient connected to face mask oxygen  Post-op Assessment: Report given to PACU RN and Post -op Vital signs reviewed and stable  Post vital signs: Reviewed and stable  Complications: No apparent anesthesia complications

## 2012-06-18 NOTE — H&P (Signed)
HISTORY AND PHYSICAL  [name] is a 51 y.o. female patient with CC: Bad teeth.  No diagnosis found.  Past Medical History  Diagnosis Date  . Anxiety   . Depression   . Cervical cancer     Discovered by pap smear, treated by Dr. Normand Sloop with LEEP procedure.  Pap since that time has been normal.  . Chronic back pain      her CT of the head without contrast June 28, 2008- moderate to large right paracentral disc protrusion at C4-C5 resulting in a right C5 neuroforaminal stenosis and probably some degree of spinal stenosis. C. bilateral C6 neuroforaminal stenosis related to  degenerative hypertrophy, no acute fracture or loose pieces identifying the cervical spine, ligamentous injury is not excluded.    Marland Kitchen Headache   . COPD (chronic obstructive pulmonary disease)   . Arthritis   . GERD (gastroesophageal reflux disease)  October 2011     Williams, esophagogram - tiny amount of gastroesophageal reflux otherwise unremarkable exam, done by Dr. Tinnie Gens  . Osteoporosis     T score L femur neck = -2.6 (02/21/12)  . Fibromyalgia     Current Facility-Administered Medications  Medication Dose Route Frequency Provider Last Rate Last Dose  . lactated ringers infusion   Intravenous Continuous Judie Petit, MD 50 mL/hr at 06/18/12 1031     Allergies  Allergen Reactions  . Pregabalin Hives    On trunk of body  . Aripiprazole     Suicidal ideation, also with SSRI  . Zolpidem Tartrate   . Cymbalta (Duloxetine Hcl) Other (See Comments)    Irritability. 3/11   Active Problems:  * No active hospital problems. *   Vitals: There were no vitals taken for this visit. Lab results: Results for orders placed during the hospital encounter of 06/18/12 (from the past 24 hour(s))  GLUCOSE, CAPILLARY     Status: Normal   Collection Time   06/18/12  8:33 AM      Component Value Range   Glucose-Capillary 87  70 - 99 mg/dL  GLUCOSE, CAPILLARY     Status: Normal   Collection Time   06/18/12  10:35 AM      Component Value Range   Glucose-Capillary 77  70 - 99 mg/dL   Radiology Results: No results found. General appearance: cooperative and no distress Head: Normocephalic, without obvious abnormality, atraumatic Eyes: negative Ears: normal TM's and external ear canals both ears Nose: Nares normal. Septum midline. Mucosa normal. No drainage or sinus tenderness. Throat: Dental caries, periodontal disease teeth #'s 4, 5, 6, 7, 8, 9, 10, 11, 12, 13, 19, 20, 21, 22, 23, 24, 25, 26, 27, 28, 29, 31 Neck: no adenopathy, supple, symmetrical, trachea midline and thyroid not enlarged, symmetric, no tenderness/mass/nodules Resp: clear to auscultation bilaterally Cardio: regular rate and rhythm, S1, S2 normal, no murmur, click, rub or gallop  Assessment: 51 WF COPD, GERD, Chronic Pain, Depression, Fibromyalgia with  teeth #'s 4, 5, 6, 7, 8, 9, 10, 11, 12, 13, 19, 20, 21, 22, 23, 24, 25, 26, 27, 28, 29, 31.  Plan: Extractteeth #'s 4, 5, 6, 7, 8, 9, 10, 11, 12, 13, 19, 20, 21, 22, 23, 24, 25, 26, 27, 28, 29, 31. Alveoloplasty. General anesthesia. Day surgery.    Georgia Lopes 06/18/2012

## 2012-06-18 NOTE — Preoperative (Signed)
Beta Blockers   Reason not to administer Beta Blockers:Not Applicable 

## 2012-06-18 NOTE — Progress Notes (Signed)
After multiple attempts at offering fluids, (water & gingerale) pt. Continues to say she cannot swallow.  Pt. States "its burning".  Call to Dr. Barbette Merino, reported pt. inability to swallow.  No new orders.  Will send pt. Home to rest & attempt to swallow sips of fluid. Emphasize no smoking, family member to be with her for 24 hrs.

## 2012-06-19 ENCOUNTER — Encounter (HOSPITAL_COMMUNITY): Payer: Self-pay | Admitting: Oral Surgery

## 2012-06-19 NOTE — Op Note (Signed)
[name], [name] NO.:  [id number]  MEDICAL RECORD NO.:  [id number]  LOCATION:  [facility]                         FACILITY:  [facility]  PHYSICIAN:  Georgia Lopes, M.D.  DATE OF BIRTH:  November 06, 1960  DATE OF PROCEDURE: DATE OF DISCHARGE:  06/18/2012                              OPERATIVE REPORT   ADDENDUM  POSTOPERATIVE DIAGNOSIS:  Plus right mandibular lingual torus.  PROCEDURE:  Removal of right mandibular lingual torus.  PROCEDURE:  During surgery, after the right mandibular teeth were removed, it was noted that there was approximately 1 cm x 0.5 cm torus on the lingual aspect of the right mandible.  This was smoothed by reflecting the periosteum with a periosteal elevator and using a Seldin retractor for retraction.  Then, the bone file and egg-shaped bur was used to perform the alveoplasty in this area.     Georgia Lopes, M.D.     SMJ/MEDQ  D:  06/18/2012  T:  06/19/2012  Job:  [id number]

## 2012-06-19 NOTE — Op Note (Signed)
NAMEBENJIE, WORTHAN NO.:  [id number]  MEDICAL RECORD NO.:  [id number]  LOCATION:  [facility]                         FACILITY:  [facility]  PHYSICIAN:  Georgia Lopes, M.D.  DATE OF BIRTH:  1960/08/29  DATE OF PROCEDURE:  06/18/2012 DATE OF DISCHARGE:  06/18/2012                              OPERATIVE REPORT   PREOPERATIVE DIAGNOSIS:  Nonrestorable teeth #4, #5, #6, #7, #8, #9, #10, #11, #12, #13, #19, #20, #21, #22, #23, #24, #25, #26, #27, #28, #29, #31.  POSTOPERATIVE DIAGNOSIS:  Nonrestorable teeth #4, #5, #6, #7, #8, #9, #10, #11, #12, #13, #19, #20, #21, #22, #23, #24, #25, #26, #27, #28, #29, #31.  PROCEDURE:  Removal of teeth #4, #5, #6, #7, #8, #9, #10, #11, #12, #13, #19, #20, #21, #22, #23, #24, #25, #26, #27, #28, #29, #31, alveoplasty right and left maxilla and mandible.  SURGEON:  Georgia Lopes, M.D.  ANESTHESIA:  General nasal, Dr. Michelle Piper attending.  INDICATIONS FOR PROCEDURE:  Joyanna is a 51 year old female who is referred to my office by her general dentist for removal of all remaining teeth.  Because of the number of teeth to be removed and extensiveness of the procedure, it was felt that the patient would be better served by having general anesthesia rather than outpatient sedation.  PROCEDURE:  The patient was taken to the operating room, placed on the table in supine position.  General anesthesia was administered intravenously and a nasal endotracheal tube was placed and secured.  The eyes were protected.  The patient was draped for the procedure.  Time- out was performed.  Posterior pharynx was suctioned and the Yankauer suction was used for this and then the throat pack was placed.  2% lidocaine with 1:100,000 epinephrine was infiltrated in an inferior alveolar block on the right and left side and buccal and palatal infiltration around the teeth to be removed.  Total of 16 mL was utilized.  A bite block was placed on the right side of  the mouth and then a #15 blade was used to make a full-thickness incision around the buccal and lingual aspects of teeth #19, #20, #21, #22, #23, #24, #25, #26 in the mandible and around teeth #7, #8, #9, #10, #11, #12, #13 in the maxilla on the buccal and palatal aspects.  The periosteum was reflected, then the teeth were elevated with a 301 elevator.  The lower teeth were removed from the mouth with the Ash forceps and the upper teeth were removed with a #150 forceps.  The sockets were then curetted and the periosteum was reflected to expose the alveolar crest in the maxilla and mandible.  Then, the egg-shaped bur and bone file were used to perform the alveoplasty.  The areas were then irrigated and closed with 3-0 chromic.  A bite block was repositioned to the other side of the mouth and a sweetheart retractor was used to retract the tongue.  A 15-blade was used to make a buccal and lingual incision around teeth #27, #28, #29, and #31 in the mandible and the palatal and buccal incision around teeth #4, #5, #6.  Then the periosteum was reflected with a periosteal  elevator.  The teeth were elevated with a 301 elevator and the upper teeth were removed with the #150 forceps.  The lower teeth were removed with the Ash forceps.  Tooth #31 required sectioning with the handpiece and fissure bur under irrigation.  Then, the roots were removed independently.  Then the sockets were curetted.  The periosteum was reflected and the alveoplasty was performed with the egg-shaped bur and bone file.  Then, the areas were irrigated again and suctioned and closed with 3-0 chromic.  Oral cavity was inspected, found to have good contour, hemostasis, and closure.  The oral cavity was then irrigated 1 final time and suctioned.  The throat pack was removed.  The patient was awakened, taken to the recovery room breathing spontaneously in good condition.  EBL minimum.  Complications none.  Specimens  none.     Georgia Lopes, M.D.     SMJ/MEDQ  D:  06/18/2012  T:  06/19/2012  Job:  [id number]

## 2012-06-22 ENCOUNTER — Other Ambulatory Visit: Payer: Self-pay | Admitting: *Deleted

## 2012-06-22 MED ORDER — BUDESONIDE-FORMOTEROL FUMARATE 160-4.5 MCG/ACT IN AERO: 2.0000 | INHALATION_SPRAY | Freq: Two times a day (BID) | RESPIRATORY_TRACT | Status: DC

## 2012-08-08 ENCOUNTER — Other Ambulatory Visit: Payer: Self-pay | Admitting: *Deleted

## 2012-08-08 ENCOUNTER — Encounter: Payer: Medicare Other | Admitting: Internal Medicine

## 2012-08-08 DIAGNOSIS — F419 Anxiety disorder, unspecified: Secondary | ICD-10-CM

## 2012-08-22 MED ORDER — OXYMORPHONE HCL 10 MG PO TABS: 10.0000 mg | ORAL_TABLET | Freq: Four times a day (QID) | ORAL | Status: DC | PRN

## 2012-08-22 MED ORDER — BUDESONIDE-FORMOTEROL FUMARATE 160-4.5 MCG/ACT IN AERO: 2.0000 | INHALATION_SPRAY | Freq: Two times a day (BID) | RESPIRATORY_TRACT | Status: DC

## 2012-08-22 MED ORDER — ALPRAZOLAM 2 MG PO TABS: 2.0000 mg | ORAL_TABLET | Freq: Three times a day (TID) | ORAL | Status: DC | PRN

## 2012-08-22 MED ORDER — FENTANYL 100 MCG/HR TD PT72: 1.0000 | MEDICATED_PATCH | TRANSDERMAL | Status: DC

## 2012-08-22 MED ORDER — ESOMEPRAZOLE MAGNESIUM 40 MG PO CPDR: 40.0000 mg | DELAYED_RELEASE_CAPSULE | Freq: Every day | ORAL | Status: DC

## 2012-08-29 ENCOUNTER — Encounter: Payer: Medicare Other | Admitting: Internal Medicine

## 2012-08-31 ENCOUNTER — Telehealth: Payer: Self-pay | Admitting: *Deleted

## 2012-08-31 NOTE — Telephone Encounter (Signed)
Pt called with constipation.  Last BM about a week ago.  Last 2 days she tried mineral oil, and an enema. She is straining and not able to pass stool.  She is having pressure feeling with ambulation. She is having pain to rt side of rib cage from straining. She has polyethylene Glycol ordered bid which she has not been taking.  She will take from now on. What else can she do.  Pt # [id number]

## 2012-08-31 NOTE — Telephone Encounter (Signed)
Pt has been informed and will let us know how she is doing.

## 2012-08-31 NOTE — Telephone Encounter (Signed)
[name] is doing what she can from home -   She can increase ambulation, take the miralax as prescribed, attempt another enema if tolerated.  If she does not have a bowel movement over the weekend, she can come in to the clinic to be seen and possibly try magnesium citrate or golytely for the acute issue.  If she develops worsening abdominal pain, decreased flatus, she should likely go in to the ED to be seen over the weekend.    She should take her miralax daily from now on because of the high dose of narcotics she is on.

## 2012-10-17 ENCOUNTER — Ambulatory Visit (INDEPENDENT_AMBULATORY_CARE_PROVIDER_SITE_OTHER): Payer: Medicare Other | Admitting: Internal Medicine

## 2012-10-17 ENCOUNTER — Encounter: Payer: Self-pay | Admitting: Internal Medicine

## 2012-10-17 VITALS — BP 119/74 | HR 92 | Temp 97.4°F | Ht 67.0 in | Wt 108.4 lb

## 2012-10-17 DIAGNOSIS — F17200 Nicotine dependence, unspecified, uncomplicated: Secondary | ICD-10-CM

## 2012-10-17 DIAGNOSIS — G894 Chronic pain syndrome: Secondary | ICD-10-CM

## 2012-10-17 DIAGNOSIS — F411 Generalized anxiety disorder: Secondary | ICD-10-CM

## 2012-10-17 DIAGNOSIS — F419 Anxiety disorder, unspecified: Secondary | ICD-10-CM

## 2012-10-17 DIAGNOSIS — J449 Chronic obstructive pulmonary disease, unspecified: Secondary | ICD-10-CM

## 2012-10-17 DIAGNOSIS — M2550 Pain in unspecified joint: Secondary | ICD-10-CM | POA: Insufficient documentation

## 2012-10-17 DIAGNOSIS — Z720 Tobacco use: Secondary | ICD-10-CM

## 2012-10-17 MED ORDER — FENTANYL 100 MCG/HR TD PT72: 1.0000 | MEDICATED_PATCH | TRANSDERMAL | Status: DC

## 2012-10-17 MED ORDER — OXYMORPHONE HCL 10 MG PO TABS: 10.0000 mg | ORAL_TABLET | Freq: Four times a day (QID) | ORAL | Status: DC | PRN

## 2012-10-17 MED ORDER — ALPRAZOLAM 2 MG PO TABS: 2.0000 mg | ORAL_TABLET | Freq: Three times a day (TID) | ORAL | Status: DC | PRN

## 2012-10-17 NOTE — Assessment & Plan Note (Signed)
The patient continues to desire to quit tobacco usage.  She stopped Chantix due to side effects, but is currently using nicotine patch and gum, though she also continues to smoke.  We discussed the importance of stopping smoking while on these treatments.  The patient expresses interest in speaking with CSW regarding smoking cessation. -referral made to CSW to discuss smoking cessation -continue nicotine patches and gum

## 2012-10-17 NOTE — Progress Notes (Signed)
HPI The patient is a 52 y.o. female with a history of COPD, chronic pain, anxiety, presenting for a routine follow-up visit.  The patient had a dental procedure for tooth extraction, and notes resolution of tooth pain since that time.  She has had trouble getting her upper and lower dentures fitted, but is following with her dentist to correct this problem.  The patient continues to smoke.  She notes craving the menthol flavor of cigarettes.  The patient tried chantix, but had to discontinue the medication after a couple of days due to side effects of irritability and depressed mood.  The patient is now using the nicotine patch and nicotine gum.  She is still smoking about 1 pack/day, though she claims she only smokes a few "puffs" of each cigarette.  The patient notes generalized "siffness" of her joints for the last 2 months, including shoulders, wrists, elbows, fingers, hips, knees, and feet.  Stretching exercises and walking improve the pain.  She believes it is due to the "weather".   The patient has a history of COPD.  She uses oxygen at night.  The patient has been trying to gain weight.  She has gained 14 pounds since her last visit!  ROS: General: no fevers, chills, changes in weight, changes in appetite Skin: no rash HEENT: no blurry vision, hearing changes, sore throat Pulm: no dyspnea, coughing, wheezing CV: no chest pain, palpitations, shortness of breath Abd: no abdominal pain, nausea/vomiting, diarrhea/constipation GU: no dysuria, hematuria, polyuria Ext: no myalgias Neuro: no weakness, numbness, or tingling  Filed Vitals:   10/17/12 1428  BP: 119/74  Pulse: 92  Temp: 97.4 F (36.3 C)    PEX General: alert, cooperative, and in no apparent distress HEENT: pupils equal round and reactive to light, vision grossly intact, oropharynx clear and non-erythematous  Neck: supple, no lymphadenopathy Lungs: clear to ascultation bilaterally, normal work of respiration, no  wheezes, rales, ronchi Heart: regular rate and rhythm, no murmurs, gallops, or rubs Abdomen: soft, non-tender, non-distended, normal bowel sounds Extremities: no cyanosis, clubbing, or edema.  Joints of bilaterally upper and lower extremities non-tender to palpation.  Full active and passive ROM in bilateral UE and LE. Neurologic: alert & oriented X3, cranial nerves II-XII intact, strength 5/5 throughout, sensation intact to light touch  Current Outpatient Prescriptions on File Prior to Visit  Medication Sig Dispense Refill  . albuterol (PROVENTIL HFA;VENTOLIN HFA) 108 (90 BASE) MCG/ACT inhaler Inhale 2 puffs into the lungs every 6 (six) hours as needed.      Marland Kitchen alprazolam (XANAX) 2 MG tablet Take 1 tablet (2 mg total) by mouth 3 (three) times daily as needed for anxiety.  90 tablet  2  . budesonide-formoterol (SYMBICORT) 160-4.5 MCG/ACT inhaler Inhale 2 puffs into the lungs 2 (two) times daily.  1 Inhaler  11  . Calcium Carbonate-Vitamin D (CALCIUM 600/VITAMIN D) 600-400 MG-UNIT per tablet Take 2 tablets by mouth daily.  60 tablet  11  . esomeprazole (NEXIUM) 40 MG capsule Take 1 capsule (40 mg total) by mouth daily before breakfast.  30 capsule  11  . guaiFENesin-dextromethorphan (ROBITUSSIN DM) 100-10 MG/5ML syrup Take 5 mLs by mouth 3 (three) times daily as needed for cough.  118 mL  0  . Polyethylene Glycol 3350 POWD Take 17 g by mouth daily as needed. In 10 ounces of water once or twice daily for constipation. Titrate as needed  1 Bottle  11  . varenicline (CHANTIX) 0.5 MG tablet Take 1 tablet once per  day for 3 days, followed by 1 tablet twice per day for 4 days.  Use this prescription first.  11 tablet  0  . varenicline (CHANTIX) 1 MG tablet Take 1 tablet (1 mg total) by mouth 2 (two) times daily.  60 tablet  2   Current Facility-Administered Medications on File Prior to Visit  Medication Dose Route Frequency Provider Last Rate Last Dose  . cyanocobalamin ((VITAMIN B-12)) injection 1,000  mcg  1,000 mcg Intramuscular Q30 days Edsel Petrin, DO   1,000 mcg at 02/24/11 1218    Assessment/Plan

## 2012-10-17 NOTE — Assessment & Plan Note (Signed)
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 -continue fentanyl and opana per pain contract

## 2012-10-17 NOTE — Assessment & Plan Note (Signed)
The patient has a history of GOLD stage 3 COPD.  She notes that her SOB is at its baseline.  She continues to use oxygen prn at night. -continue inhalers -paperwork filled out to continue home O2

## 2012-10-17 NOTE — Assessment & Plan Note (Signed)
The patient notes generalized joint "discomfort", improved with stretching.  This may represent mild changes of arthritis.  Etiology is likely benign, given patient's description of symptoms and timecourse.  No prolonged morning stiffness to suggest RA -will start a trial of aleve.  If unsuccessful, can pursue further work-up

## 2012-10-17 NOTE — Patient Instructions (Addendum)
General Instructions: To help you quit smoking, we will ask our Social Worker to give you a call to give you some tips on smoking cessation.  We have also given you paperwork about the [place] [facility].  For your home oxygen, we are filling out the required paperwork to renew this service.  [facility] will contact you directly to coordinate this.  For your joint stiffness, start using Aleve as needed.  Take this medication for only 1 week, and then we will assess response.  Please return for a follow-up visit in 3-4 months.  Treatment Goals:  Goals (1 Years of Data) as of 10/17/12   None      Progress Toward Treatment Goals:  Treatment Goal 10/17/2012  Stop smoking smoking the same amount    Self Care Goals & Plans:  Self Care Goal 10/17/2012  Manage my medications take my medicines as prescribed; refill my medications on time  Monitor my health keep track of my weight  Stop smoking (No Data)       Care Management & Community Referrals:  Referral 10/17/2012  Referrals made for care management support none needed

## 2012-10-23 ENCOUNTER — Telehealth: Payer: Self-pay | Admitting: Licensed Clinical Social Worker

## 2012-10-23 NOTE — Telephone Encounter (Signed)
Ms. Uresti was referred to CSW for telephonic smoking cessation.  CSW discussed referral to [facility], as they provide telephonic coach service.  Pt in agreement.  CSW will mail information agreement for out and request Ms. Ebright to send back to CSW to fax in to [facility].  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  Pt states prayer helped her quit drugs and stay clean.  Ms. Salsberry is hopeful prayer will help her quit smoking.  Pt states the gum and patch did not offer any assistance and Chantix changed her mood for the worse.  Pt feels she is not addicted to the nicotine but the habit of smoking due to her anxiety.  CSW inquired if pt was interested in attending counseling sessions to address anxiety, pt denied and states it's not the anxiety that makes her smoke it the habit of smoking.  CSW will encourage Ms. Fofana to utilize [employer] when "bored or wanting to smoke", pt hesitant that someone over the phone can make her not want to smoke but willing to try.  Pt aware of all the downsides to smoking and voices guilt over continuing.

## 2012-10-29 ENCOUNTER — Telehealth: Payer: Self-pay | Admitting: *Deleted

## 2012-10-29 NOTE — Telephone Encounter (Signed)
Thank you for this note.  Per our records, it looks like I gave her the paper script for March at our visit in January.  She recently had another office visit in March, where I have her scripts for April, May, and June.  She has been filling these prescriptions consistently, and hasn't had any red flag behavior lately.  If her last fill was 2/29 (or 2/28), it's ok to give a verbal phone order to fill her Fentanyl (100 mcg, #15 patches) and Opana (10 mg, #120 tabs) now, as they appear in her "medications" tab in epic, then her next fill will be 4/29 (for which she has the paper prescription).

## 2012-10-29 NOTE — Telephone Encounter (Signed)
Someone from [facility] calls to advocate for pt stating she does not have a script for march, there are 2 scripts in pt's med folder for 3/29 also 2 for 2/29 but pt did have scripts filled 2/28??? According to [place] narcotics reporting system. Does she have multiple scripts? Please advise

## 2012-11-23 ENCOUNTER — Telehealth: Payer: Self-pay | Admitting: *Deleted
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
## 2013-01-09 ENCOUNTER — Encounter: Payer: Self-pay | Admitting: Internal Medicine

## 2013-01-09 ENCOUNTER — Ambulatory Visit (INDEPENDENT_AMBULATORY_CARE_PROVIDER_SITE_OTHER): Payer: Medicare Other | Admitting: Internal Medicine

## 2013-01-09 VITALS — BP 110/73 | HR 95 | Temp 97.3°F | Ht 67.0 in | Wt 97.3 lb

## 2013-01-09 DIAGNOSIS — F17200 Nicotine dependence, unspecified, uncomplicated: Secondary | ICD-10-CM

## 2013-01-09 DIAGNOSIS — G894 Chronic pain syndrome: Secondary | ICD-10-CM

## 2013-01-09 DIAGNOSIS — F419 Anxiety disorder, unspecified: Secondary | ICD-10-CM

## 2013-01-09 DIAGNOSIS — Z720 Tobacco use: Secondary | ICD-10-CM

## 2013-01-09 DIAGNOSIS — F411 Generalized anxiety disorder: Secondary | ICD-10-CM

## 2013-01-09 MED ORDER — OXYMORPHONE HCL 10 MG PO TABS: 10.0000 mg | ORAL_TABLET | Freq: Four times a day (QID) | ORAL | Status: DC | PRN

## 2013-01-09 MED ORDER — ALPRAZOLAM 2 MG PO TABS: 2.0000 mg | ORAL_TABLET | Freq: Four times a day (QID) | ORAL | Status: DC | PRN

## 2013-01-09 MED ORDER — ALPRAZOLAM 2 MG PO TABS: 2.0000 mg | ORAL_TABLET | Freq: Three times a day (TID) | ORAL | Status: DC | PRN

## 2013-01-09 MED ORDER — FENTANYL 100 MCG/HR TD PT72: 1.0000 | MEDICATED_PATCH | TRANSDERMAL | Status: DC
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
## 2013-01-09 NOTE — Progress Notes (Signed)
Case discussed with Dr. Brown at the time of the visit.  We reviewed the resident's history and exam and pertinent patient test results.  I agree with the assessment, diagnosis and plan of care documented in the resident's note. 
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
## 2013-01-09 NOTE — Assessment & Plan Note (Addendum)
The patient's medications were refilled.  [place] narcotics database review revealed only the prescriptions we have been prescribing. -continue fentanyl, opana -check UDS at next visit, if not sooner

## 2013-01-23 ENCOUNTER — Telehealth: Payer: Self-pay | Admitting: *Deleted

## 2013-01-23 NOTE — Telephone Encounter (Signed)
Problems with constipation. Pt would never answer question - last BM. States doing Miralax daily. Has tried laxative and mineral oil. Suggest for quick results - try fleets enema. If problem cont with no results - call [facility] back. Stanton Kidney Briggette Najarian RN 01/23/13 2:30PM

## 2013-04-10 ENCOUNTER — Ambulatory Visit (INDEPENDENT_AMBULATORY_CARE_PROVIDER_SITE_OTHER): Payer: PRIVATE HEALTH INSURANCE | Admitting: Internal Medicine

## 2013-04-10 ENCOUNTER — Encounter: Payer: Self-pay | Admitting: Internal Medicine

## 2013-04-10 VITALS — BP 103/71 | HR 76 | Temp 97.7°F | Ht 67.0 in | Wt 94.5 lb

## 2013-04-10 DIAGNOSIS — R636 Underweight: Secondary | ICD-10-CM

## 2013-04-10 DIAGNOSIS — K5903 Drug induced constipation: Secondary | ICD-10-CM | POA: Insufficient documentation

## 2013-04-10 DIAGNOSIS — G894 Chronic pain syndrome: Secondary | ICD-10-CM

## 2013-04-10 DIAGNOSIS — R634 Abnormal weight loss: Secondary | ICD-10-CM

## 2013-04-10 DIAGNOSIS — F329 Major depressive disorder, single episode, unspecified: Secondary | ICD-10-CM

## 2013-04-10 DIAGNOSIS — F17200 Nicotine dependence, unspecified, uncomplicated: Secondary | ICD-10-CM

## 2013-04-10 DIAGNOSIS — Z720 Tobacco use: Secondary | ICD-10-CM

## 2013-04-10 DIAGNOSIS — Z23 Encounter for immunization: Secondary | ICD-10-CM

## 2013-04-10 DIAGNOSIS — K5900 Constipation, unspecified: Secondary | ICD-10-CM

## 2013-04-10 MED ORDER — OXYMORPHONE HCL 10 MG PO TABS: 10.0000 mg | ORAL_TABLET | Freq: Four times a day (QID) | ORAL | Status: DC | PRN

## 2013-04-10 MED ORDER — FENTANYL 100 MCG/HR TD PT72: 1.0000 | MEDICATED_PATCH | TRANSDERMAL | Status: DC

## 2013-04-10 MED ORDER — LACTULOSE 20 GM/30ML PO SOLN: 20.0000 g | Freq: Every day | ORAL | Status: DC | PRN

## 2013-04-10 MED ORDER — MEGESTROL ACETATE 625 MG/5ML PO SUSP: 625.0000 mg | Freq: Every day | ORAL | Status: DC

## 2013-04-10 MED ORDER — SENNOSIDES-DOCUSATE SODIUM 8.6-50 MG PO TABS: 1.0000 | ORAL_TABLET | Freq: Two times a day (BID) | ORAL | Status: DC

## 2013-04-10 NOTE — Assessment & Plan Note (Signed)
The patient has a history of chronic pain, on large dosages of narcotic pain medications for several years.  However, despite this, she continues to note significant pain.  Given that these medications are likely worsening her other medical problems above, we may need to start downtitrating these medications, though this will undoubtedly worsen the patient's symptoms of pain. -will consider decreasing dose of pain medications if symptoms not improving at next office visit -check UDS at next visit

## 2013-04-10 NOTE — Patient Instructions (Addendum)
General Instructions: For your constipation: 1. Drink plenty of water throughout the day 2. Take Senokot-S, 1 tablet twice per day every day 3. Take Lactulose, 20 g once per day as needed  For your weight loss: 1. We are referring you to Gastroenterology for a colonoscopy 2. We are prescribing megace to improve your appetite  Your weight loss and constipation may also be a side effect of your Narcotic pain medications.  If your symptoms do not improve, we may need to decrease your dose of these medications.  Your symptoms may also represent symptoms of Depression.  We strongly recommend seeking help with a counselor.  Please return for a follow-up visit in 3 months.   Treatment Goals:  Goals (1 Years of Data) as of 04/10/13   None      Progress Toward Treatment Goals:  Treatment Goal 04/10/2013  Stop smoking smoking the same amount  Prevent falls unchanged    Self Care Goals & Plans:  Self Care Goal 04/10/2013  Manage my medications take my medicines as prescribed; refill my medications on time; bring my medications to every visit  Monitor my health keep track of my weight  Eat healthy foods eat baked foods instead of fried foods; eat more vegetables  Be physically active (No Data)  Stop smoking -       Care Management & Community Referrals:  Referral 04/10/2013  Referrals made for care management support none needed

## 2013-04-10 NOTE — Assessment & Plan Note (Signed)
The patient notes symptoms consistent with major depression.  She adamantly refuses pharmacologic therapy, or seeing a psychiatrist/psychologist, due to stigma of treatment, and cost, stating that "the Shaune Pollack" will help her instead.  She notes suicidal ideation with zoloft and prozac in the past. -a large amount of time was spent attempting to convince the patient that this was a significant problem.  At this time, with her history of SI with prior antidepressants, I feel that a psychiatrist is needed to help recommend an antidepressant that would be beneficial for this patient.  Will continue to address at future visits.

## 2013-04-10 NOTE — Assessment & Plan Note (Signed)
  Assessment: Progress toward smoking cessation:  smoking the same amount Barriers to progress toward smoking cessation:  none Comments: Patient not interested in trying smoking cessation aids at this time  Plan: Instruction/counseling given:  I counseled patient on the dangers of tobacco use, advised patient to stop smoking, and reviewed strategies to maximize success. Educational resources provided:    Self management tools provided:    Medications to assist with smoking cessation:  None Patient agreed to the following self-care plans for smoking cessation:    Other plans: Read[employer] at next visit

## 2013-04-10 NOTE — Assessment & Plan Note (Addendum)
The patient has had weight fluctuations for the last several years, most recently with a trend towards weight loss.  She notes no nausea, abd pain, or dysphagia.  EGD 2010 revealed no upper GI cause of weight loss.  For cancer screening, she is up to date on mammography and pap smear.  Per a note from Dr. Marina Goodell from 2010, the patient had a colonoscopy in Jan 2009, c/b poor preparation, though with a follow-up air-contrast barium enema which was negative.  Contributors to the patient's weight loss likely include major depressive disorder, constipation, narcotic side effects, smoking.  Unlikely cancer, though with suboptimal quality of colonoscopy in 2009, pt may need repeat colonoscopy to r/o colon cancer. -will refer back to  GI, for consideration for repeat colonoscopy to r/o colon cancer -will address constipation per above -will restart megace, which the patient has taken in the past -pt is severely resistant to treatment for MDD, see below -if above unsuccessful, will start down-titrating narcotics
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
## 2013-04-10 NOTE — Assessment & Plan Note (Signed)
The patient notes significant constipation, likely as a side effect of narcotic pain medications, resistant to PEG. -drink plenty of water -start docusate-senna, 1 tab BID -start Lactulose, per pt preference, daily prn -if the above regimen fails to improve constipation, we will likely need to decrease patient's narcotic usage

## 2013-04-11 ENCOUNTER — Encounter: Payer: Self-pay | Admitting: Internal Medicine

## 2013-04-15 NOTE — Progress Notes (Signed)
Case discussed with Dr. Brown soon after the resident saw the patient.  We reviewed the resident's history and exam and pertinent patient test results.  I agree with the assessment, diagnosis, and plan of care documented in the resident's note. 

## 2013-05-02 ENCOUNTER — Telehealth: Payer: Self-pay | Admitting: *Deleted

## 2013-05-02 NOTE — Telephone Encounter (Signed)
Pt called since 04/28/13 has stayed in the house - too tired and states problems with her breathing. Offered appt 05/02/13 - has to pick up grand daughter. Appt made for 05/03/13 10:15AM with Dr Manson Passey. Stanton Kidney Auburn Hert RN 05/02/13 12:20PM

## 2013-05-03 ENCOUNTER — Ambulatory Visit: Payer: PRIVATE HEALTH INSURANCE | Admitting: Internal Medicine

## 2013-05-11 ENCOUNTER — Emergency Department (HOSPITAL_COMMUNITY)
Admission: EM | Admit: 2013-05-11 | Discharge: 2013-05-11 | Disposition: A | Discharge status: Home / Self Care | Payer: PRIVATE HEALTH INSURANCE | Attending: Emergency Medicine | Admitting: Emergency Medicine

## 2013-05-11 ENCOUNTER — Emergency Department (HOSPITAL_COMMUNITY): Payer: PRIVATE HEALTH INSURANCE

## 2013-05-11 ENCOUNTER — Encounter (HOSPITAL_COMMUNITY): Payer: Self-pay | Admitting: Emergency Medicine

## 2013-05-11 DIAGNOSIS — G8929 Other chronic pain: Secondary | ICD-10-CM | POA: Insufficient documentation

## 2013-05-11 DIAGNOSIS — F3289 Other specified depressive episodes: Secondary | ICD-10-CM | POA: Insufficient documentation

## 2013-05-11 DIAGNOSIS — Z79899 Other long term (current) drug therapy: Secondary | ICD-10-CM | POA: Insufficient documentation

## 2013-05-11 DIAGNOSIS — Z8541 Personal history of malignant neoplasm of cervix uteri: Secondary | ICD-10-CM | POA: Insufficient documentation

## 2013-05-11 DIAGNOSIS — F411 Generalized anxiety disorder: Secondary | ICD-10-CM | POA: Insufficient documentation

## 2013-05-11 DIAGNOSIS — Z8739 Personal history of other diseases of the musculoskeletal system and connective tissue: Secondary | ICD-10-CM | POA: Insufficient documentation

## 2013-05-11 DIAGNOSIS — F329 Major depressive disorder, single episode, unspecified: Secondary | ICD-10-CM | POA: Insufficient documentation

## 2013-05-11 DIAGNOSIS — F17200 Nicotine dependence, unspecified, uncomplicated: Secondary | ICD-10-CM | POA: Insufficient documentation

## 2013-05-11 DIAGNOSIS — R0602 Shortness of breath: Secondary | ICD-10-CM

## 2013-05-11 DIAGNOSIS — K219 Gastro-esophageal reflux disease without esophagitis: Secondary | ICD-10-CM | POA: Insufficient documentation

## 2013-05-11 DIAGNOSIS — J441 Chronic obstructive pulmonary disease with (acute) exacerbation: Secondary | ICD-10-CM | POA: Insufficient documentation

## 2013-05-11 DIAGNOSIS — Z8679 Personal history of other diseases of the circulatory system: Secondary | ICD-10-CM | POA: Insufficient documentation

## 2013-05-11 DIAGNOSIS — IMO0002 Reserved for concepts with insufficient information to code with codable children: Secondary | ICD-10-CM | POA: Insufficient documentation

## 2013-05-11 DIAGNOSIS — R05 Cough: Secondary | ICD-10-CM

## 2013-05-11 LAB — BASIC METABOLIC PANEL
Calcium: 9.9 mg/dL (ref 8.4–10.5)
GFR calc Af Amer: >90 mL/min (ref >90)
GFR calc non Af Amer: >90 mL/min (ref >90)
Sodium: 135 mEq/L (ref 135–145)

## 2013-05-11 LAB — CBC WITH DIFFERENTIAL/PLATELET
Basophils Absolute: 0 10*3/uL (ref 0.0–0.1)
Basophils Relative: 0 % (ref 0–1)
Eosinophils Absolute: 0.1 10*3/uL (ref 0.0–0.7)
Eosinophils Relative: 1 % (ref 0–5)
Lymphocytes Relative: 36 % (ref 12–46)
MCH: 32 pg (ref 26.0–34.0)
MCV: 93.7 fL (ref 78.0–100.0)
Monocytes Absolute: 0.5 10*3/uL (ref 0.1–1.0)
Platelets: 273 10*3/uL (ref 150–400)
RBC: 4.59 MIL/uL (ref 3.87–5.11)
RDW: 14.2 % (ref 11.5–15.5)
WBC: 5.4 10*3/uL (ref 4.0–10.5)

## 2013-05-11 LAB — POCT I-STAT TROPONIN I: Troponin i, poc: 0 ng/mL (ref 0.00–0.08)

## 2013-05-11 MED ORDER — FENTANYL CITRATE 0.05 MG/ML IJ SOLN
50.0000 ug | Freq: Once | INTRAMUSCULAR | Status: AC
  Administered 2013-05-11: 50 ug via INTRAVENOUS
  Filled 2013-05-11: qty 2

## 2013-05-11 NOTE — ED Notes (Signed)
Pt has fentanyl patch on sternum. Asking for pain medication as she states patch is old, and she has not removed it yet.

## 2013-05-11 NOTE — ED Notes (Signed)
Bed: WA20 Expected date: 05/11/13 Expected time: 8:04 AM Means of arrival: Ambulance Comments: anxiety

## 2013-05-11 NOTE — ED Provider Notes (Signed)
Medical screening examination/treatment/procedure(s) were performed by non-physician practitioner and as supervising physician I was immediately available for consultation/collaboration.  Courtney F Horton, MD 05/11/13 1909 

## 2013-05-11 NOTE — ED Notes (Signed)
Pt. Ambulated with the pulse ox out in the hallway.  Pt. Oxygen was 99% and pulse at 111-112. Pt. Stated  That she didn't feel dizzy or light headed. Nurse was notified of ambulation.

## 2013-05-11 NOTE — ED Provider Notes (Signed)
CSN: [id number]     Arrival date & time 05/11/13  0818 History   First MD Initiated Contact with Patient 05/11/13 [phone]     Chief Complaint  Patient presents with  . Shortness of Breath  . Cough   (Consider location/radiation/quality/duration/timing/severity/associated sxs/prior Treatment) Patient is a 52 y.o. female presenting with shortness of breath and cough. The history is provided by the patient and medical records.  Shortness of Breath Associated symptoms: cough   Cough Associated symptoms: shortness of breath    This is a 52 y.o. Female with PMH significant for anxiety, depression, chronic back pain, COPD, arthritis, GERD, osteoporosis, fibromyalgia, presenting to the ED for productive cough and SOB x 1 week.  Denies any sick contacts, fevers, sweats, or chills.  No recent travel, surgeries, LE edema, or calf pain.  No prior hx of DVT or PE.  Denies any chest pain or palpitations.  No abdominal pain, N/V/D.  Pt is a daily smoker.  Has been using home inhalers without improvement. O2 sats 100% on RA on arrival.  Pt is not on home O2.  Past Medical History  Diagnosis Date  . Anxiety   . Depression   . Cervical cancer     Discovered by pap smear, treated by Dr. Normand Sloop with LEEP procedure.  Pap since that time has been normal.  . Chronic back pain      her CT of the head without contrast June 28, 2008- moderate to large right paracentral disc protrusion at C4-C5 resulting in a right C5 neuroforaminal stenosis and probably some degree of spinal stenosis. C. bilateral C6 neuroforaminal stenosis related to  degenerative hypertrophy, no acute fracture or loose pieces identifying the cervical spine, ligamentous injury is not excluded.    Marland Kitchen Headache(784.0)   . COPD (chronic obstructive pulmonary disease)   . Arthritis   . GERD (gastroesophageal reflux disease)  October 2011     Williams, esophagogram - tiny amount of gastroesophageal reflux otherwise unremarkable exam, done by Dr.  Tinnie Gens  . Osteoporosis     T score L femur neck = -2.6 (02/21/12)  . Fibromyalgia   . Hemorrhoids    Past Surgical History  Procedure Laterality Date  . Cervical cone biopsy      leep procedure  . Multiple extractions with alveoloplasty  06/18/2012    Procedure: MULTIPLE EXTRACION WITH ALVEOLOPLASTY;  Surgeon: Georgia Lopes, DDS;  Location: [facility] OR;  Service: Oral Surgery;  Laterality: Bilateral;   Family History  Problem Relation Age of Onset  . Stroke Mother   . Diabetes Mother   . Heart disease Father   . Lung cancer Daughter    History  Substance Use Topics  . Smoking status: Current Every Day Smoker -- 1.00 packs/day for 34 years    Types: Cigarettes  . Smokeless tobacco: Not on file     Comment: hx drugs non since 95/ TRYING TO QUITS  . Alcohol Use: No   OB History   Grav Para Term Preterm Abortions TAB SAB Ect Mult Living                 Review of Systems  Respiratory: Positive for cough and shortness of breath.   All other systems reviewed and are negative.    Allergies  Pregabalin; Aripiprazole; Chantix; Zolpidem tartrate; and Cymbalta  Home Medications   Current Outpatient Rx  Name  Route  Sig  Dispense  Refill  . albuterol (PROVENTIL HFA;VENTOLIN HFA) 108 (90 BASE) MCG/ACT inhaler  Inhalation   Inhale 2 puffs into the lungs every 6 (six) hours as needed.         Marland Kitchen EXPIRED: alprazolam (XANAX) 2 MG tablet   Oral   Take 1 tablet (2 mg total) by mouth 3 (three) times daily as needed for anxiety.   90 tablet   5     May fill 02/26/13   . budesonide-formoterol (SYMBICORT) 160-4.5 MCG/ACT inhaler   Inhalation   Inhale 2 puffs into the lungs 2 (two) times daily.   1 Inhaler   11   . EXPIRED: Calcium Carbonate-Vitamin D (CALCIUM 600/VITAMIN D) 600-400 MG-UNIT per tablet   Oral   Take 2 tablets by mouth daily.   60 tablet   11   . esomeprazole (NEXIUM) 40 MG capsule   Oral   Take 1 capsule (40 mg total) by mouth daily before breakfast.   30  capsule   11   . fentaNYL (DURAGESIC - DOSED MCG/HR) 100 MCG/HR   Transdermal   Place 1 patch (100 mcg total) onto the skin every other day.   15 patch   0     May fill 07/29/13   . guaiFENesin-dextromethorphan (ROBITUSSIN DM) 100-10 MG/5ML syrup   Oral   Take 5 mLs by mouth 3 (three) times daily as needed for cough.   118 mL   0   . Lactulose 20 GM/30ML SOLN   Oral   Take 30 mLs (20 g total) by mouth daily as needed.   500 mL   3   . megestrol (MEGACE ES) 625 MG/5ML suspension   Oral   Take 5 mLs (625 mg total) by mouth daily.   150 mL   5   . oxymorphone (OPANA) 10 MG tablet   Oral   Take 1 tablet (10 mg total) by mouth 4 (four) times daily as needed for pain.   120 tablet   0     May fill 07/29/13   . Polyethylene Glycol 3350 POWD   Oral   Take 17 g by mouth daily as needed. In 10 ounces of water once or twice daily for constipation. Titrate as needed   1 Bottle   11   . senna-docusate (SENOKOT-S) 8.6-50 MG per tablet   Oral   Take 1 tablet by mouth 2 (two) times daily.   60 tablet   5    BP 113/72  Pulse 108  Temp(Src) 97.8 F (36.6 C) (Oral)  Resp 22  SpO2 100%  Physical Exam  Nursing note and vitals reviewed. Constitutional: She is oriented to person, place, and time. She appears well-developed and well-nourished. No distress.  Smells strongly of cigarette smoke  HENT:  Head: Normocephalic and atraumatic.  Right Ear: Tympanic membrane and ear canal normal.  Left Ear: Tympanic membrane and ear canal normal.  Nose: Nose normal.  Mouth/Throat: Uvula is midline, oropharynx is clear and moist and mucous membranes are normal. No oropharyngeal exudate, posterior oropharyngeal edema, posterior oropharyngeal erythema or tonsillar abscesses.  Some PND in oropharynx  Eyes: Conjunctivae and EOM are normal. Pupils are equal, round, and reactive to light.  Neck: Normal range of motion. Neck supple.  Cardiovascular: Normal rate, regular rhythm and normal  heart sounds.   Pulmonary/Chest: Effort normal and breath sounds normal. No respiratory distress. She has no wheezes.  Coarse breath sounds bilaterally  Abdominal: Soft. Bowel sounds are normal. There is no tenderness. There is no guarding.  Musculoskeletal: Normal range of motion. She exhibits no  edema.  Neurological: She is alert and oriented to person, place, and time.  Skin: Skin is warm and dry. She is not diaphoretic.  Psychiatric: She has a normal mood and affect.    ED Course  Procedures (including critical care time)   Date: 05/11/2013  Rate: 97  Rhythm: normal sinus rhythm  QRS Axis: normal  Intervals: PR shortened  ST/T Wave abnormalities: normal  Conduction Disutrbances:none  Narrative Interpretation: mildly shortened PR, NSR, no STEMI  Old EKG Reviewed: changes noted   Labs Review Labs Reviewed  CBC WITH DIFFERENTIAL  BASIC METABOLIC PANEL  POCT I-STAT TROPONIN I   Imaging Review Dg Chest 2 View  05/11/2013   CLINICAL DATA:  Cough. Short of breath.  EXAM: CHEST  2 VIEW  COMPARISON:  06/13/2012  FINDINGS: The heart size and mediastinal contours are within normal limits.  The lungs are hyperexpanded. The lungs are clear. No pleural effusion or pneumothorax.  The bony thorax is intact.  IMPRESSION: No acute cardiopulmonary disease.  COPD.   Electronically Signed   By: Amie Portland M.D.   On: 05/11/2013 09:23    MDM   1. Cough   2. SOB (shortness of breath)    EKG NSR, no acute ischemic changes.  Trop negative.  CXR with chronic COPD, no acute disease.  Labs unremarkable.  Pt ambulated and maintained O2 sats at 99%, no dizziness or lightheadedness.  Doubt ACS, PE, dissection, or other vascular collapse at this time.  Pt afebrile, non-toxic appearing, NAD, VS stable- ok for discharge.  I have strongly encouraged pt to stop smoking.  FU with PCP.  Discussed plan with pt, she agreed.  Return precautions advised.  Garlon Hatchet, PA-C 05/11/13 1124

## 2013-05-11 NOTE — ED Notes (Signed)
Pt complains of sob and non productive cough for past week.

## 2013-05-15 ENCOUNTER — Encounter: Payer: Self-pay | Admitting: Internal Medicine

## 2013-05-15 ENCOUNTER — Ambulatory Visit (INDEPENDENT_AMBULATORY_CARE_PROVIDER_SITE_OTHER): Payer: PRIVATE HEALTH INSURANCE | Admitting: Internal Medicine

## 2013-05-15 VITALS — BP 90/60 | HR 113 | Ht 67.0 in | Wt 94.4 lb

## 2013-05-15 DIAGNOSIS — K5900 Constipation, unspecified: Secondary | ICD-10-CM

## 2013-05-15 MED ORDER — POLYETHYLENE GLYCOL 3350 17 GM/SCOOP PO POWD: 17.0000 g | Freq: Every day | ORAL | Status: DC

## 2013-05-15 MED ORDER — POLYETHYLENE GLYCOL 3350 17 GM/SCOOP PO POWD: ORAL | Status: DC

## 2013-05-15 NOTE — Patient Instructions (Signed)
We have sent the following medications to your pharmacy for you to pick up at your convenience:  Miralax [facility]).  Take 17gm (one packet) mixed in 8 ounces of water twice a day.  Adjust as needed as directed by Dr. Marina Goodell

## 2013-05-15 NOTE — Progress Notes (Signed)
HISTORY OF PRESENT ILLNESS:  [name] is a 52 y.o. female with multiple significant medical problems including advanced COPD, chronic pain syndrome for which she is on chronic narcotics, arthritis, anxiety/depression, ongoing tobacco abuse, remote history of drug abuse, and chronic constipation. She presents today regarding chronic constipation. The patient was last seen in this office in June of 2010 regarding vague dysphagia, loss of appetite, and weight loss. Upper endoscopy was unremarkable. Previous colonoscopy to evaluate abdominal discomfort constipation and bloating was performed January 2009. The exam was carried out to the transverse colon due to poor prep. Subsequent air-contrast barium enema was negative. She continues with constipation. Ongoing for years. On significant narcotics. Has been using lactulose and senna without significant results. She has tried [employer] previously, but only one scoop daily. Moves her bowels about once per week or less. No bleeding. Overall progressive lung disease and decrease in functional status. Poor appetite with continued weight loss. She continues to smoke.  REVIEW OF SYSTEMS:  All non-GI ROS negative except for anxiety, arthritis, back pain, headaches, confusion, fatigue, irregular heart rate, muscle cramps, night sweats, shortness of breath, fatigue, sleeping problems, excessive urination, swollen lymph glands  Past Medical History  Diagnosis Date  . Anxiety   . Depression   . Cervical cancer     Discovered by pap smear, treated by Dr. Normand Sloop with LEEP procedure.  Pap since that time has been normal.  . Chronic back pain      her CT of the head without contrast June 28, 2008- moderate to large right paracentral disc protrusion at C4-C5 resulting in a right C5 neuroforaminal stenosis and probably some degree of spinal stenosis. C. bilateral C6 neuroforaminal stenosis related to  degenerative hypertrophy, no acute fracture or loose pieces  identifying the cervical spine, ligamentous injury is not excluded.    Marland Kitchen Headache(784.0)   . COPD (chronic obstructive pulmonary disease)   . Arthritis   . GERD (gastroesophageal reflux disease)  October 2011     Williams, esophagogram - tiny amount of gastroesophageal reflux otherwise unremarkable exam, done by Dr. Tinnie Gens  . Osteoporosis     T score L femur neck = -2.6 (02/21/12)  . Fibromyalgia   . Hemorrhoids     Past Surgical History  Procedure Laterality Date  . Cervical cone biopsy      leep procedure  . Multiple extractions with alveoloplasty  06/18/2012    Procedure: MULTIPLE EXTRACION WITH ALVEOLOPLASTY;  Surgeon: Georgia Lopes, DDS;  Location: [facility] OR;  Service: Oral Surgery;  Laterality: Bilateral;    Social History DORETTE HARTEL  reports that she has been smoking Cigarettes.  She has a 34 pack-year smoking history. She has never used smokeless tobacco. She reports that she does not drink alcohol or use illicit drugs.  family history includes Diabetes in her mother; Heart disease in her father; Lung cancer in her daughter; Stroke in her mother.  Allergies  Allergen Reactions  . Pregabalin Hives    On trunk of body  . Aripiprazole     Suicidal ideation, also with SSRI  . Chantix [Varenicline]     Irritability and depressed mood  . Zolpidem Tartrate   . Cymbalta [Duloxetine Hcl] Other (See Comments)    Irritability. 3/11       PHYSICAL EXAMINATION: Vital signs: BP 90/60  Pulse 113  Ht 5\' 7"  (1.702 m)  Wt 94 lb 6 oz (42.808 kg)  BMI 14.78 kg/m2  SpO2 97% General: Chronically ill-appearing, thin and  frail, no acute distress. Looks older than stated age HEENT: Sclerae are anicteric, conjunctiva pink. Oral mucosa intact Lungs: Clear with distant breath Heart: Regular Abdomen: soft, nontender, nondistended, no obvious ascites, no peritoneal signs, normal bowel sounds. No organomegaly. Extremities: No edema Psychiatric: alert and oriented x3.  Cooperative   ASSESSMENT:  #1. Chronic constipation. Ongoing. Exacerbated by narcotics #2. Multiple significant medical problems   PLAN:  #1. Recommend GlycoLax. Start with 17 g twice a day. Titrate to need as discussed. #2. Return to the care of your PCP. GI followup as needed

## 2013-05-22 ENCOUNTER — Encounter: Payer: Self-pay | Admitting: Internal Medicine

## 2013-05-22 ENCOUNTER — Ambulatory Visit (INDEPENDENT_AMBULATORY_CARE_PROVIDER_SITE_OTHER): Payer: PRIVATE HEALTH INSURANCE | Admitting: Internal Medicine

## 2013-05-22 VITALS — BP 109/79 | HR 84 | Temp 97.5°F | Ht 67.0 in | Wt 100.5 lb

## 2013-05-22 DIAGNOSIS — F17200 Nicotine dependence, unspecified, uncomplicated: Secondary | ICD-10-CM

## 2013-05-22 DIAGNOSIS — R5381 Other malaise: Secondary | ICD-10-CM

## 2013-05-22 DIAGNOSIS — E538 Deficiency of other specified B group vitamins: Secondary | ICD-10-CM

## 2013-05-22 DIAGNOSIS — K5900 Constipation, unspecified: Secondary | ICD-10-CM

## 2013-05-22 DIAGNOSIS — R252 Cramp and spasm: Secondary | ICD-10-CM

## 2013-05-22 DIAGNOSIS — Z720 Tobacco use: Secondary | ICD-10-CM

## 2013-05-22 DIAGNOSIS — R5383 Other fatigue: Secondary | ICD-10-CM | POA: Insufficient documentation

## 2013-05-22 DIAGNOSIS — R636 Underweight: Secondary | ICD-10-CM

## 2013-05-22 MED ORDER — OXYMORPHONE HCL 10 MG PO TABS: 10.0000 mg | ORAL_TABLET | Freq: Four times a day (QID) | ORAL | Status: DC | PRN

## 2013-05-22 MED ORDER — FENTANYL 100 MCG/HR TD PT72: 1.0000 | MEDICATED_PATCH | TRANSDERMAL | Status: DC

## 2013-05-22 MED ORDER — CYANOCOBALAMIN 1000 MCG/ML IJ SOLN
1000.0000 ug | Freq: Once | INTRAMUSCULAR | Status: AC
  Administered 2013-05-22: 1000 ug via INTRAMUSCULAR

## 2013-05-22 NOTE — Progress Notes (Signed)
HPI The patient is a 52 y.o. female with a history of B12 deficiency, depression, chronic pain, tobacco abuse, presenting for a follow-up visit for weight loss.  The patient has now gained 10 lbs since starting Megace, which she is happy about.  She notes increased appetite and PO food intake.  The patient notes chronic low energy, though worse over the last 1 month.  She has a history of vit B12 deficiency, previously on monthly injections, though she has not received these in many months.  She asks about a B12 shot.  Additionally, the patient notes occasional symptoms of lightheadedness when standing, present for the last month.  Symptoms are worse in the late afternoon.  The patient continues to smoke cigarettes, smoking about 1 ppd, which is less than she used to smoke.  She remains resistant to discussion treatment options, preferring to quit through prayer.  ROS: General: no fevers, chills Skin: no rash HEENT: no blurry vision, hearing changes, sore throat Pulm: no dyspnea, coughing, wheezing CV: no chest pain, palpitations, shortness of breath Abd: no abdominal pain, nausea/vomiting, diarrhea/constipation GU: no dysuria, hematuria, polyuria Ext: see HPI Neuro: no weakness, numbness, or tingling  Filed Vitals:   05/22/13 1441  BP: 109/79  Pulse: 84  Temp: 97.5 F (36.4 C)   Orthostatic vitals negative when going from sitting to standing.  Lying BP likely an outlier.  PEX General: alert, cooperative, and in no apparent distress HEENT: pupils equal round and reactive to light, vision grossly intact, oropharynx clear and non-erythematous  Neck: supple Lungs: clear to ascultation bilaterally, normal work of respiration, no wheezes, rales, ronchi Heart: regular rate and rhythm, no murmurs, gallops, or rubs Abdomen: soft, non-tender, non-distended, normal bowel sounds Extremities: no cyanosis, clubbing, or edema Neurologic: alert & oriented X3, cranial nerves II-XII intact,  strength grossly intact, sensation intact to light touch  Current Outpatient Prescriptions on File Prior to Visit  Medication Sig Dispense Refill  . albuterol (PROVENTIL HFA;VENTOLIN HFA) 108 (90 BASE) MCG/ACT inhaler Inhale 2 puffs into the lungs every 6 (six) hours as needed for wheezing or shortness of breath.       . alprazolam (XANAX) 2 MG tablet Take 2 mg by mouth 3 (three) times daily as needed for sleep or anxiety.      . budesonide-formoterol (SYMBICORT) 160-4.5 MCG/ACT inhaler Inhale 2 puffs into the lungs 2 (two) times daily.  1 Inhaler  11  . esomeprazole (NEXIUM) 40 MG capsule Take 1 capsule (40 mg total) by mouth daily before breakfast.  30 capsule  11  . fentaNYL (DURAGESIC - DOSED MCG/HR) 100 MCG/HR Place 1 patch (100 mcg total) onto the skin every other day.  15 patch  0  . Lactulose 20 GM/30ML SOLN Take 30 mLs (20 g total) by mouth daily as needed.  500 mL  3  . megestrol (MEGACE ES) 625 MG/5ML suspension Take 5 mLs (625 mg total) by mouth daily.  150 mL  5  . oxymorphone (OPANA) 10 MG tablet Take 1 tablet (10 mg total) by mouth 4 (four) times daily as needed for pain.  120 tablet  0  . polyethylene glycol powder (GLYCOLAX/MIRALAX) powder Take 17 g by mouth daily.  255 g  3  . polyethylene glycol powder (GLYCOLAX/MIRALAX) powder Mix 17gm (one packet) in 8 oz of water twice a day  527 g  3  . senna-docusate (SENOKOT-S) 8.6-50 MG per tablet Take 1 tablet by mouth 2 (two) times daily.  60 tablet  5  Current Facility-Administered Medications on File Prior to Visit  Medication Dose Route Frequency Provider Last Rate Last Dose  . cyanocobalamin ((VITAMIN B-12)) injection 1,000 mcg  1,000 mcg Intramuscular Q30 days Edsel Petrin, DO   1,000 mcg at 02/24/11 1218    Assessment/Plan

## 2013-05-22 NOTE — Assessment & Plan Note (Signed)
Constipation has improved with her bowel regimen

## 2013-05-22 NOTE — Assessment & Plan Note (Signed)
The patient has gained about 10 pounds since her last visit after starting Megace. As discussed previously, contributors to weight loss likely include tobacco use, untreated depression, and use of narcotics. -continue megace -Patient remained consistent to counseling or pharmacologic treatment of depression

## 2013-05-22 NOTE — Assessment & Plan Note (Signed)
  Assessment: Progress toward smoking cessation:  smoking less Barriers to progress toward smoking cessation:  withdrawal symptoms;stress Comments: The patient is currently smoking one pack per day, but she notes that this is less than previous.  The patient remains resistant to nicotine replacement products, Wellbutrin, or Chantix, noting prior bad experiences  Plan: Instruction/counseling given:  I counseled patient on the dangers of tobacco use, advised patient to stop smoking, and reviewed strategies to maximize success. Educational resources provided:    Self management tools provided:    Medications to assist with smoking cessation:  None Patient agreed to the following self-care plans for smoking cessation:    Other plans: Reassess at next visit

## 2013-05-22 NOTE — Assessment & Plan Note (Signed)
Patient notes lack of energy, and vague feelings of occasional lightheadedness. No dizziness, and orthostatics negative. TSH normal when last checked. Last CBC normal, with MCV in the 90s, so iron deficiency less likely.  Patient has a history of B12 deficiency, and has not had B12 injections in many months. -Check B12 level today -Give B12 injection today -If B12 levels low, we'll prescribe daily B12 pills

## 2013-05-22 NOTE — Patient Instructions (Addendum)
General Instructions: You have gained some weight since your last visit, good job!  Continue taking Megace.  For your low energy, we are checking a vitamin B12 level today, and also giving you a vitamin B12 shot.  If your B12 levels are low, I will send in a prescription for B12 tablets to your pharmacy, 1 tablet once per day.  If you don't hear from me, that means your levels were normal.  We are checking your magnesium levels today.  If they are abnormal, I will call you with the results.  If you don't hear from me, that means your levels were normal.  Please return for a follow-up visit in 3 months.   Treatment Goals:  Goals (1 Years of Data) as of 05/22/13   None      Progress Toward Treatment Goals:  Treatment Goal 05/22/2013  Stop smoking smoking less  Prevent falls unchanged    Self Care Goals & Plans:  Self Care Goal 04/10/2013  Manage my medications take my medicines as prescribed; refill my medications on time; bring my medications to every visit  Monitor my health keep track of my weight  Eat healthy foods eat baked foods instead of fried foods; eat more vegetables  Be physically active (No Data)  Stop smoking -    No flowsheet data found.   Care Management & Community Referrals:  Referral 05/22/2013  Referrals made for care management support none needed

## 2013-05-23 NOTE — Progress Notes (Signed)
Case discussed with Dr. Brown at the time of the visit.  We reviewed the resident's history and exam and pertinent patient test results.  I agree with the assessment, diagnosis and plan of care documented in the resident's note. 

## 2013-07-29 ENCOUNTER — Encounter: Payer: Self-pay | Admitting: Internal Medicine

## 2013-07-29 ENCOUNTER — Ambulatory Visit (INDEPENDENT_AMBULATORY_CARE_PROVIDER_SITE_OTHER): Payer: PRIVATE HEALTH INSURANCE | Admitting: Internal Medicine

## 2013-07-29 ENCOUNTER — Other Ambulatory Visit: Payer: Self-pay | Admitting: Internal Medicine

## 2013-07-29 VITALS — BP 95/65 | HR 96 | Temp 97.1°F | Ht 67.0 in | Wt 105.7 lb

## 2013-07-29 DIAGNOSIS — Z720 Tobacco use: Secondary | ICD-10-CM

## 2013-07-29 DIAGNOSIS — F17200 Nicotine dependence, unspecified, uncomplicated: Secondary | ICD-10-CM

## 2013-07-29 DIAGNOSIS — R5383 Other fatigue: Secondary | ICD-10-CM

## 2013-07-29 DIAGNOSIS — R5381 Other malaise: Secondary | ICD-10-CM

## 2013-07-29 MED ORDER — FENTANYL 100 MCG/HR TD PT72: 100.0000 ug | MEDICATED_PATCH | TRANSDERMAL | Status: DC

## 2013-07-29 MED ORDER — OXYMORPHONE HCL 10 MG PO TABS: 10.0000 mg | ORAL_TABLET | Freq: Four times a day (QID) | ORAL | Status: DC | PRN

## 2013-07-29 NOTE — Patient Instructions (Signed)
General Instructions: For your chronic pain, we have refilled your prescriptions.  Congratulations on cutting back on smoking!  Quitting smoking is the best thing you can do for your health.  We are checking your thyroid levels today.  If anything is abnormal, I will give you a call.  If you don't hear from me, you can assume the results are normal.  Please return for a follow-up visit in 3-4 months.   Treatment Goals:  Goals (1 Years of Data) as of 07/29/13   None      Progress Toward Treatment Goals:  Treatment Goal 07/29/2013  Stop smoking smoking less  Prevent falls -    Self Care Goals & Plans:  Self Care Goal 05/22/2013  Manage my medications take my medicines as prescribed; refill my medications on time  Monitor my health keep track of my weight  Eat healthy foods eat more vegetables  Be physically active -  Stop smoking -    No flowsheet data found.   Care Management & Community Referrals:  Referral 07/29/2013  Referrals made for care management support none needed

## 2013-07-29 NOTE — Assessment & Plan Note (Signed)
  Assessment: Progress toward smoking cessation:  smoking less Barriers to progress toward smoking cessation:  withdrawal symptoms Comments: The patient has cut back somewhat on smoking through willpower.  She remains resistant to other treatment  Plan: Instruction/counseling given:  I counseled patient on the dangers of tobacco use, advised patient to stop smoking, and reviewed strategies to maximize success. Educational resources provided:    Self management tools provided:    Medications to assist with smoking cessation:  None Patient agreed to the following self-care plans for smoking cessation:    Other plans: Reassess at next visit

## 2013-07-29 NOTE — Assessment & Plan Note (Signed)
Lack of energy he is a chronic complaint for Ms. Vater. I believe the patient likely has contributors of major depressive disorder (resistant to treatment or psychiatry referral) and narcotic side effects. B12 was checked at last visit, and was found to be normal. TSH was normal when checked 2 years ago. -Check TSH today

## 2013-07-29 NOTE — Progress Notes (Signed)
Case discussed with Dr. Brown at time of visit.  We reviewed the resident's history and exam and pertinent patient test results.  I agree with the assessment, diagnosis, and plan of care documented in the resident's note. 

## 2013-07-29 NOTE — Progress Notes (Signed)
HPI The patient is a 52 y.o. female with a history of chronic pain, depression, tobacco abuse, COPD, presenting for a followup visit.  The patient continues to note low energy, as mentioned at her last visit.  She does note some cold intolerance and constipation.  The patient has gained another 5 lbs (intentionally) since her last visit, with Megace.  The patient continues to smoke, though she notes that she has cut back to "not quite a whole pack" per day, from 1 ppd.  The patient notes using her willpower to reduce cravings.  ROS: General: no fevers, chills Skin: no rash HEENT: no blurry vision, hearing changes, sore throat Pulm: no dyspnea, coughing, wheezing CV: no chest pain, palpitations, shortness of breath Abd: no abdominal pain, nausea/vomiting, diarrhea/constipation GU: no dysuria, hematuria, polyuria Ext: no arthralgias, myalgias Neuro: no weakness, numbness, or tingling  Filed Vitals:   07/29/13 1342  BP: 95/65  Pulse: 96  Temp: 97.1 F (36.2 C)    PEX General: alert, cooperative, and in no apparent distress HEENT: pupils equal round and reactive to light, vision grossly intact, oropharynx clear and non-erythematous  Neck: supple, no thyromegaly Lungs: clear to ascultation bilaterally, normal work of respiration, no wheezes, rales, ronchi Heart: regular rate and rhythm, no murmurs, gallops, or rubs Abdomen: soft, non-tender, non-distended, normal bowel sounds Extremities: no cyanosis, clubbing, or edema Neurologic: alert & oriented X3, cranial nerves II-XII intact, strength grossly intact, sensation intact to light touch  Current Outpatient Prescriptions on File Prior to Visit  Medication Sig Dispense Refill  . albuterol (PROVENTIL HFA;VENTOLIN HFA) 108 (90 BASE) MCG/ACT inhaler Inhale 2 puffs into the lungs every 6 (six) hours as needed for wheezing or shortness of breath.       . budesonide-formoterol (SYMBICORT) 160-4.5 MCG/ACT inhaler Inhale 2 puffs into the  lungs 2 (two) times daily.  1 Inhaler  11  . esomeprazole (NEXIUM) 40 MG capsule Take 1 capsule (40 mg total) by mouth daily before breakfast.  30 capsule  11  . Lactulose 20 GM/30ML SOLN Take 30 mLs (20 g total) by mouth daily as needed.  500 mL  3  . megestrol (MEGACE ES) 625 MG/5ML suspension Take 5 mLs (625 mg total) by mouth daily.  150 mL  5  . polyethylene glycol powder (GLYCOLAX/MIRALAX) powder Take 17 g by mouth daily.  255 g  3  . polyethylene glycol powder (GLYCOLAX/MIRALAX) powder Mix 17gm (one packet) in 8 oz of water twice a day  527 g  3  . senna-docusate (SENOKOT-S) 8.6-50 MG per tablet Take 1 tablet by mouth 2 (two) times daily.  60 tablet  5   Current Facility-Administered Medications on File Prior to Visit  Medication Dose Route Frequency Provider Last Rate Last Dose  . cyanocobalamin ((VITAMIN B-12)) injection 1,000 mcg  1,000 mcg Intramuscular Q30 days Edsel Petrin, DO   1,000 mcg at 02/24/11 1218    Assessment/Plan

## 2013-08-13 ENCOUNTER — Emergency Department (HOSPITAL_COMMUNITY): Payer: PRIVATE HEALTH INSURANCE

## 2013-08-13 ENCOUNTER — Emergency Department (HOSPITAL_COMMUNITY)
Admission: EM | Admit: 2013-08-13 | Discharge: 2013-08-13 | Disposition: A | Discharge status: Home / Self Care | Payer: PRIVATE HEALTH INSURANCE | Attending: Emergency Medicine | Admitting: Emergency Medicine

## 2013-08-13 ENCOUNTER — Emergency Department (INDEPENDENT_AMBULATORY_CARE_PROVIDER_SITE_OTHER)
Admission: EM | Admit: 2013-08-13 | Discharge: 2013-08-13 | Disposition: A | Discharge status: Nursing Home (Includes ICF) | Payer: PRIVATE HEALTH INSURANCE | Source: Home / Self Care

## 2013-08-13 ENCOUNTER — Encounter (HOSPITAL_COMMUNITY): Payer: Self-pay | Admitting: Emergency Medicine

## 2013-08-13 DIAGNOSIS — F3289 Other specified depressive episodes: Secondary | ICD-10-CM | POA: Insufficient documentation

## 2013-08-13 DIAGNOSIS — F329 Major depressive disorder, single episode, unspecified: Secondary | ICD-10-CM | POA: Insufficient documentation

## 2013-08-13 DIAGNOSIS — F1320 Sedative, hypnotic or anxiolytic dependence, uncomplicated: Secondary | ICD-10-CM

## 2013-08-13 DIAGNOSIS — R062 Wheezing: Secondary | ICD-10-CM | POA: Insufficient documentation

## 2013-08-13 DIAGNOSIS — Z8719 Personal history of other diseases of the digestive system: Secondary | ICD-10-CM | POA: Insufficient documentation

## 2013-08-13 DIAGNOSIS — T40601A Poisoning by unspecified narcotics, accidental (unintentional), initial encounter: Secondary | ICD-10-CM

## 2013-08-13 DIAGNOSIS — T40602A Poisoning by unspecified narcotics, intentional self-harm, initial encounter: Secondary | ICD-10-CM

## 2013-08-13 DIAGNOSIS — K219 Gastro-esophageal reflux disease without esophagitis: Secondary | ICD-10-CM | POA: Insufficient documentation

## 2013-08-13 DIAGNOSIS — T400X1A Poisoning by opium, accidental (unintentional), initial encounter: Secondary | ICD-10-CM

## 2013-08-13 DIAGNOSIS — R0902 Hypoxemia: Secondary | ICD-10-CM

## 2013-08-13 DIAGNOSIS — J449 Chronic obstructive pulmonary disease, unspecified: Secondary | ICD-10-CM

## 2013-08-13 DIAGNOSIS — R079 Chest pain, unspecified: Secondary | ICD-10-CM | POA: Insufficient documentation

## 2013-08-13 DIAGNOSIS — R0600 Dyspnea, unspecified: Secondary | ICD-10-CM

## 2013-08-13 DIAGNOSIS — Z888 Allergy status to other drugs, medicaments and biological substances status: Secondary | ICD-10-CM | POA: Insufficient documentation

## 2013-08-13 DIAGNOSIS — G8929 Other chronic pain: Secondary | ICD-10-CM | POA: Insufficient documentation

## 2013-08-13 DIAGNOSIS — R0989 Other specified symptoms and signs involving the circulatory and respiratory systems: Secondary | ICD-10-CM

## 2013-08-13 DIAGNOSIS — Z8709 Personal history of other diseases of the respiratory system: Secondary | ICD-10-CM | POA: Insufficient documentation

## 2013-08-13 DIAGNOSIS — R05 Cough: Secondary | ICD-10-CM

## 2013-08-13 DIAGNOSIS — M129 Arthropathy, unspecified: Secondary | ICD-10-CM | POA: Insufficient documentation

## 2013-08-13 DIAGNOSIS — R053 Chronic cough: Secondary | ICD-10-CM

## 2013-08-13 DIAGNOSIS — F411 Generalized anxiety disorder: Secondary | ICD-10-CM | POA: Insufficient documentation

## 2013-08-13 DIAGNOSIS — Z8669 Personal history of other diseases of the nervous system and sense organs: Secondary | ICD-10-CM | POA: Insufficient documentation

## 2013-08-13 DIAGNOSIS — IMO0001 Reserved for inherently not codable concepts without codable children: Secondary | ICD-10-CM | POA: Insufficient documentation

## 2013-08-13 DIAGNOSIS — M549 Dorsalgia, unspecified: Secondary | ICD-10-CM | POA: Insufficient documentation

## 2013-08-13 DIAGNOSIS — Z8541 Personal history of malignant neoplasm of cervix uteri: Secondary | ICD-10-CM | POA: Insufficient documentation

## 2013-08-13 DIAGNOSIS — J441 Chronic obstructive pulmonary disease with (acute) exacerbation: Secondary | ICD-10-CM | POA: Insufficient documentation

## 2013-08-13 DIAGNOSIS — R509 Fever, unspecified: Secondary | ICD-10-CM | POA: Insufficient documentation

## 2013-08-13 DIAGNOSIS — F1310 Sedative, hypnotic or anxiolytic abuse, uncomplicated: Secondary | ICD-10-CM

## 2013-08-13 DIAGNOSIS — R0609 Other forms of dyspnea: Secondary | ICD-10-CM

## 2013-08-13 DIAGNOSIS — F17200 Nicotine dependence, unspecified, uncomplicated: Secondary | ICD-10-CM | POA: Insufficient documentation

## 2013-08-13 DIAGNOSIS — Z79899 Other long term (current) drug therapy: Secondary | ICD-10-CM | POA: Insufficient documentation

## 2013-08-13 DIAGNOSIS — R059 Cough, unspecified: Secondary | ICD-10-CM

## 2013-08-13 MED ORDER — IPRATROPIUM BROMIDE 0.02 % IN SOLN
0.5000 mg | Freq: Once | RESPIRATORY_TRACT | Status: AC
  Administered 2013-08-13: 0.5 mg via RESPIRATORY_TRACT
  Filled 2013-08-13: qty 2.5

## 2013-08-13 MED ORDER — METHYLPREDNISOLONE SODIUM SUCC 125 MG IJ SOLR
125.0000 mg | Freq: Once | INTRAMUSCULAR | Status: AC
  Administered 2013-08-13: 125 mg via INTRAVENOUS
  Filled 2013-08-13: qty 2

## 2013-08-13 MED ORDER — ALBUTEROL SULFATE (2.5 MG/3ML) 0.083% IN NEBU
2.5000 mg | INHALATION_SOLUTION | Freq: Once | RESPIRATORY_TRACT | Status: AC
  Administered 2013-08-13: 2.5 mg via RESPIRATORY_TRACT

## 2013-08-13 MED ORDER — PREDNISONE 10 MG PO TABS: 20.0000 mg | ORAL_TABLET | Freq: Every day | ORAL | Status: DC

## 2013-08-13 MED ORDER — LEVOFLOXACIN 500 MG PO TABS: 500.0000 mg | ORAL_TABLET | Freq: Every day | ORAL | Status: DC

## 2013-08-13 MED ORDER — ALBUTEROL SULFATE (5 MG/ML) 0.5% IN NEBU
2.5000 mg | INHALATION_SOLUTION | Freq: Once | RESPIRATORY_TRACT | Status: DC
  Filled 2013-08-13: qty 3
  Filled 2013-08-13: qty 0.5

## 2013-08-13 MED ORDER — SODIUM CHLORIDE 0.9 % IV SOLN
Freq: Once | INTRAVENOUS | Status: AC
  Administered 2013-08-13: 14:00:00 via INTRAVENOUS

## 2013-08-13 NOTE — ED Notes (Signed)
Pt is brought by friends... Pt c/o SOB, disorientation, cough, congestion, abd pain, fatigue, weakness Denies: uninary sxs... Reports she has hx of COPD and has been on 3 litters of O2 She is alert; resting and feeling tired... No signs of acute distress.

## 2013-08-13 NOTE — ED Notes (Signed)
3 litters of O2

## 2013-08-13 NOTE — ED Notes (Signed)
Pt transfer from [facility], states cough and congestion x3 days, pt also states that this morning o2 sats were "80%"  And coughing up thick white secretions. Pt has copd, currently smokes, on pain killers, report of poss cocaine use to EMS, pt denies. Alert and oriented, nad noted.

## 2013-08-13 NOTE — ED Provider Notes (Signed)
CSN: [id number]     Arrival date & time 08/13/13  1243 History   First MD Initiated Contact with Patient 08/13/13 1318     Chief Complaint  Patient presents with  . Shortness of Breath   (Consider location/radiation/quality/duration/timing/severity/associated sxs/prior Treatment) HPI Comments: 53 year old frail, underweight female presents with her daughter and son-in-law stating that patient has been feeling bad for 2-3 days. This morning she was found to be disoriented and unusually sedated. She feels tired and is having breathing problems. She is usually on oxygen 3-1/2 L at night but there is some doubt that she has been using it. She has a history of COPD, GERD and chronic back pain due to DDD. Her medications include fentanyl patch, oxymorphone and Xanax 2 mg.    Past Medical History  Diagnosis Date  . Anxiety   . Depression   . Cervical cancer     Discovered by pap smear, treated by Dr. Charlesetta Garibaldi with LEEP procedure.  Pap since that time has been normal.  . Chronic back pain      her CT of the head without contrast June 28, 2008- moderate to large right paracentral disc protrusion at C4-C5 resulting in a right C5 neuroforaminal stenosis and probably some degree of spinal stenosis. C. bilateral C6 neuroforaminal stenosis related to  degenerative hypertrophy, no acute fracture or loose pieces identifying the cervical spine, ligamentous injury is not excluded.    Marland Kitchen Headache(784.0)   . COPD (chronic obstructive pulmonary disease)   . Arthritis   . GERD (gastroesophageal reflux disease)  October 2011     Williams, esophagogram - tiny amount of gastroesophageal reflux otherwise unremarkable exam, done by Dr. Dellis Filbert  . Osteoporosis     T score L femur neck = -2.6 (02/21/12)  . Fibromyalgia   . Hemorrhoids    Past Surgical History  Procedure Laterality Date  . Cervical cone biopsy      leep procedure  . Multiple extractions with alveoloplasty  06/18/2012    Procedure: MULTIPLE  EXTRACION WITH ALVEOLOPLASTY;  Surgeon: Gae Bon, DDS;  Location: [place];  Service: Oral Surgery;  Laterality: Bilateral;   Family History  Problem Relation Age of Onset  . Stroke Mother   . Diabetes Mother   . Heart disease Father   . Lung cancer Daughter    History  Substance Use Topics  . Smoking status: Current Every Day Smoker -- 1.00 packs/day for 34 years    Types: Cigarettes  . Smokeless tobacco: Never Used     Comment: hx drugs non since 95/ TRYING TO QUITS  . Alcohol Use: No   OB History   Grav Para Term Preterm Abortions TAB SAB Ect Mult Living                 Review of Systems  Constitutional: Positive for fever, activity change, appetite change and fatigue.  Respiratory: Positive for cough and shortness of breath.   Cardiovascular: Negative for chest pain and leg swelling.  Gastrointestinal: Negative.   Neurological: Positive for weakness.  Psychiatric/Behavioral: Positive for decreased concentration.    Allergies  Pregabalin; Aripiprazole; Chantix; Zolpidem tartrate; and Cymbalta  Home Medications   Current Outpatient Rx  Name  Route  Sig  Dispense  Refill  . albuterol (PROVENTIL HFA;VENTOLIN HFA) 108 (90 BASE) MCG/ACT inhaler   Inhalation   Inhale 2 puffs into the lungs every 6 (six) hours as needed for wheezing or shortness of breath.          Marland Kitchen  alprazolam (XANAX) 2 MG tablet      TAKE ONE (1) TABLET BY MOUTH 3 TIMES DAILY AS NEEDED FOR ANXIETY   90 tablet   5   . budesonide-formoterol (SYMBICORT) 160-4.5 MCG/ACT inhaler   Inhalation   Inhale 2 puffs into the lungs 2 (two) times daily.   1 Inhaler   11   . esomeprazole (NEXIUM) 40 MG capsule   Oral   Take 1 capsule (40 mg total) by mouth daily before breakfast.   30 capsule   11   . fentaNYL (DURAGESIC - DOSED MCG/HR) 100 MCG/HR   Transdermal   Place 1 patch (100 mcg total) onto the skin every other day.   15 patch   0     May fill 11/27/13   . Lactulose 20 GM/30ML SOLN    Oral   Take 30 mLs (20 g total) by mouth daily as needed.   500 mL   3   . megestrol (MEGACE ES) 625 MG/5ML suspension   Oral   Take 5 mLs (625 mg total) by mouth daily.   150 mL   5   . oxymorphone (OPANA) 10 MG tablet   Oral   Take 1 tablet (10 mg total) by mouth 4 (four) times daily as needed for pain.   120 tablet   0     May fill 11/27/13   . polyethylene glycol powder (GLYCOLAX/MIRALAX) powder   Oral   Take 17 g by mouth daily.   255 g   3   . polyethylene glycol powder (GLYCOLAX/MIRALAX) powder      Mix 17gm (one packet) in 8 oz of water twice a day   527 g   3   . senna-docusate (SENOKOT-S) 8.6-50 MG per tablet   Oral   Take 1 tablet by mouth 2 (two) times daily.   60 tablet   5    BP 124/81  Pulse 128  Temp(Src) 100.1 F (37.8 C) (Oral)  Resp 18  SpO2 95% Physical Exam  Nursing note and vitals reviewed. Constitutional: She appears lethargic.  Patient appears nearly cachectic and underweight.  Eyes: EOM are normal.  Pupils are pinpoint, conjunctiva clear  Neck: Normal range of motion. Neck supple.  Cardiovascular:  Tachycardia  Pulmonary/Chest: Effort normal. No respiratory distress. She has wheezes. She has rales.  Musculoskeletal: She exhibits no edema.  Lymphadenopathy:    She has no cervical adenopathy.  Neurological: She appears lethargic.  While sitting on the table for exam her head slumps and sometimes needs assistance maintaining position. Falls asleep easily, Speech a little slurred.  Skin: Skin is warm and dry.    ED Course  Procedures (including critical care time) Labs Review Labs Reviewed - No data to display Imaging Review No results found.    MDM   1. Opiate overdose   2. Moderate benzodiazepine use disorder   3. Cough, persistent   4. COPD (chronic obstructive pulmonary disease)   5. Hypoxia   6. Dyspnea   7. 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 This is in addition to Xanax 2 mg tablets. Also with confusion, dyspnea, hypoxia , tachycardia and fever.   Janne Napoleon, NP 08/13/13 1356

## 2013-08-13 NOTE — ED Notes (Signed)
Care link has been given report 

## 2013-08-13 NOTE — ED Provider Notes (Signed)
Medical screening examination/treatment/procedure(s) were performed by resident physician or non-physician practitioner and as supervising physician I was immediately available for consultation/collaboration.   Pauline Good MD.   Billy Fischer, MD 08/13/13 [phone]

## 2013-08-13 NOTE — ED Provider Notes (Signed)
CSN: [id number]     Arrival date & time 08/13/13  1428 History   First MD Initiated Contact with Patient 08/13/13 1458     Chief Complaint  Patient presents with  . Shortness of Breath  . Chest Pain  ) HPI  Patient presents with the chief complaint that she is short of breath. However she states she feels better after being in a treatment care with EMS. EMS providers note states that she was transferred with a diagnosis of "psychosis". She does use chronic benzodiazepines and Opana, and sentinel patch for chronic pain. She denies any overt ingestions. She is awake here. Chest pain. She's she is normally short of breath most days. Seemed really not different. She says her medications at home. She is well oxygenated 96% on room air.  Past Medical History  Diagnosis Date  . Anxiety   . Depression   . Cervical cancer     Discovered by pap smear, treated by Dr. Charlesetta Garibaldi with LEEP procedure.  Pap since that time has been normal.  . Chronic back pain      her CT of the head without contrast June 28, 2008- moderate to large right paracentral disc protrusion at C4-C5 resulting in a right C5 neuroforaminal stenosis and probably some degree of spinal stenosis. C. bilateral C6 neuroforaminal stenosis related to  degenerative hypertrophy, no acute fracture or loose pieces identifying the cervical spine, ligamentous injury is not excluded.    Marland Kitchen Headache(784.0)   . COPD (chronic obstructive pulmonary disease)   . Arthritis   . GERD (gastroesophageal reflux disease)  October 2011     Williams, esophagogram - tiny amount of gastroesophageal reflux otherwise unremarkable exam, done by Dr. Dellis Filbert  . Osteoporosis     T score L femur neck = -2.6 (02/21/12)  . Fibromyalgia   . Hemorrhoids    Past Surgical History  Procedure Laterality Date  . Cervical cone biopsy      leep procedure  . Multiple extractions with alveoloplasty  06/18/2012    Procedure: MULTIPLE EXTRACION WITH ALVEOLOPLASTY;  Surgeon:  Gae Bon, DDS;  Location: [place];  Service: Oral Surgery;  Laterality: Bilateral;   Family History  Problem Relation Age of Onset  . Stroke Mother   . Diabetes Mother   . Heart disease Father   . Lung cancer Daughter    History  Substance Use Topics  . Smoking status: Current Every Day Smoker -- 1.00 packs/day for 34 years    Types: Cigarettes  . Smokeless tobacco: Never Used     Comment: hx drugs non since 95/ TRYING TO QUITS  . Alcohol Use: No   OB History   Grav Para Term Preterm Abortions TAB SAB Ect Mult Living                 Review of Systems  Constitutional: Positive for fever. Negative for chills, diaphoresis, appetite change and fatigue.  HENT: Negative for mouth sores, sore throat and trouble swallowing.   Eyes: Negative for visual disturbance.  Respiratory: Positive for cough and shortness of breath. Negative for chest tightness and wheezing.   Cardiovascular: Negative for chest pain.  Gastrointestinal: Negative for nausea, vomiting, abdominal pain, diarrhea and abdominal distention.  Endocrine: Negative for polydipsia, polyphagia and polyuria.  Genitourinary: Negative for dysuria, frequency and hematuria.  Musculoskeletal: Negative for gait problem.  Skin: Negative for color change, pallor and rash.  Neurological: Negative for dizziness, syncope, light-headedness and headaches.  Hematological: Does not bruise/bleed easily.  Psychiatric/Behavioral: Negative for behavioral problems and confusion.    Allergies  Aripiprazole; Chantix; Cymbalta; Pregabalin; and Zolpidem tartrate  Home Medications   Current Outpatient Rx  Name  Route  Sig  Dispense  Refill  . albuterol (PROVENTIL HFA;VENTOLIN HFA) 108 (90 BASE) MCG/ACT inhaler   Inhalation   Inhale 2 puffs into the lungs every 6 (six) hours as needed for wheezing or shortness of breath.          . alprazolam (XANAX) 2 MG tablet   Oral   Take 2 mg by mouth 3 (three) times daily as needed for anxiety.          . budesonide-formoterol (SYMBICORT) 160-4.5 MCG/ACT inhaler   Inhalation   Inhale 2 puffs into the lungs 2 (two) times daily.   1 Inhaler   11   . esomeprazole (NEXIUM) 40 MG capsule   Oral   Take 1 capsule (40 mg total) by mouth daily before breakfast.   30 capsule   11   . fentaNYL (DURAGESIC - DOSED MCG/HR) 100 MCG/HR   Transdermal   Place 1 patch (100 mcg total) onto the skin every other day.   15 patch   0     May fill 11/27/13   . Lactulose 20 GM/30ML SOLN   Oral   Take 20 g by mouth daily as needed (for constipation).         . megestrol (MEGACE ES) 625 MG/5ML suspension   Oral   Take 5 mLs (625 mg total) by mouth daily.   150 mL   5   . naproxen sodium (ANAPROX) 220 MG tablet   Oral   Take 220 mg by mouth daily as needed (for pain).         Marland Kitchen oxymorphone (OPANA) 10 MG tablet   Oral   Take 1 tablet (10 mg total) by mouth 4 (four) times daily as needed for pain.   120 tablet   0     May fill 11/27/13   . polyethylene glycol powder (GLYCOLAX/MIRALAX) powder   Oral   Take 17 g by mouth daily.   255 g   3   . levofloxacin (LEVAQUIN) 500 MG tablet   Oral   Take 1 tablet (500 mg total) by mouth daily.   10 tablet   0   . predniSONE (DELTASONE) 10 MG tablet   Oral   Take 2 tablets (20 mg total) by mouth daily.   10 tablet   0    BP 119/72  Pulse 113  Temp(Src) 100.2 F (37.9 C) (Oral)  Resp 28  Ht 5\' 7"  (1.702 m)  Wt 100 lb (45.36 kg)  BMI 15.66 kg/m2  SpO2 95% Physical Exam  Constitutional: She is oriented to person, place, and time. She appears well-developed and well-nourished. No distress.  She is sleeping. She awakens to voice. She is oriented x3.  HENT:  Head: Normocephalic.  Pulse 3 mm and reactive. Conjunctiva are not pale  Eyes: Conjunctivae are normal. Pupils are equal, round, and reactive to light. No scleral icterus.  Neck: Normal range of motion. Neck supple. No thyromegaly present.  No JVD  Cardiovascular: Normal  rate and regular rhythm.  Exam reveals no gallop and no friction rub.   No murmur heard. Heart rate 93 beats per minute.    Pulmonary/Chest: Effort normal. No respiratory distress. She has no decreased breath sounds. She has wheezes in the right upper field, the right middle field, the right lower field,  the left upper field, the left middle field and the left lower field. She has no rales.  Abdominal: Soft. Bowel sounds are normal. She exhibits no distension. There is no tenderness. There is no rebound.  Musculoskeletal: Normal range of motion.  Neurological: She is alert and oriented to person, place, and time.  Skin: Skin is warm and dry. No rash noted.  Psychiatric: She has a normal mood and affect. Her behavior is normal.    ED Course  Procedures (including critical care time) Labs Review Labs Reviewed - No data to display Imaging Review Dg Chest Port 1 View  08/13/2013   CLINICAL DATA:  Short of breath and chest pain  EXAM: PORTABLE CHEST - 1 VIEW  COMPARISON:  05/11/2013  FINDINGS: The heart size and mediastinal contours are within normal limits. Both lungs are clear. The visualized skeletal structures are unremarkable.  IMPRESSION: No active disease.   Electronically Signed   By: Franchot Gallo M.D.   On: 08/13/2013 15:31    EKG Interpretation   None       MDM   1. COPD exacerbation    Normal pulmonary exam with exception of globally diminished breath sounds after treatment.  No wheezing.  No pneumonia.  Plan symptomatic treatment for COPD exacerbation prednisone Levaquin and close followup dollars at home.  She is awake she is not sedate advised to cut back on her medications and to discuss her dosages with her primary care physician    Tanna Furry, MD 08/13/13 [phone]

## 2013-08-21 ENCOUNTER — Other Ambulatory Visit: Payer: Self-pay | Admitting: Internal Medicine

## 2013-09-12 ENCOUNTER — Telehealth: Payer: Self-pay | Admitting: *Deleted

## 2013-09-12 NOTE — Telephone Encounter (Signed)
Returned pt's call - pt c/o "bad cold and cough"; can't sleep at night,coughing up phlegm, no fever. She tried OTC cough syrup and is drinking fluids. Appt has been scheduled for 2/13 w/Dr Blaine Hamper.

## 2013-09-13 ENCOUNTER — Encounter: Payer: Self-pay | Admitting: Internal Medicine

## 2013-09-13 ENCOUNTER — Ambulatory Visit (HOSPITAL_COMMUNITY)
Admission: RE | Admit: 2013-09-13 | Discharge: 2013-09-13 | Disposition: A | Discharge status: Home / Self Care | Payer: PRIVATE HEALTH INSURANCE | Source: Ambulatory Visit | Attending: Internal Medicine | Admitting: Internal Medicine

## 2013-09-13 ENCOUNTER — Ambulatory Visit (INDEPENDENT_AMBULATORY_CARE_PROVIDER_SITE_OTHER): Payer: PRIVATE HEALTH INSURANCE | Admitting: Internal Medicine

## 2013-09-13 VITALS — BP 112/76 | HR 103 | Temp 97.0°F | Wt 103.2 lb

## 2013-09-13 DIAGNOSIS — R059 Cough, unspecified: Secondary | ICD-10-CM

## 2013-09-13 DIAGNOSIS — J449 Chronic obstructive pulmonary disease, unspecified: Secondary | ICD-10-CM

## 2013-09-13 DIAGNOSIS — R0602 Shortness of breath: Secondary | ICD-10-CM

## 2013-09-13 DIAGNOSIS — R05 Cough: Secondary | ICD-10-CM

## 2013-09-13 DIAGNOSIS — J4489 Other specified chronic obstructive pulmonary disease: Secondary | ICD-10-CM

## 2013-09-13 LAB — CBC WITH DIFFERENTIAL/PLATELET
Basophils Absolute: 0 10*3/uL (ref 0.0–0.1)
Basophils Relative: 0 % (ref 0–1)
EOS ABS: 0.1 10*3/uL (ref 0.0–0.7)
Eosinophils Relative: 2 % (ref 0–5)
HCT: 44 % (ref 36.0–46.0)
HEMOGLOBIN: 15 g/dL (ref 12.0–15.0)
LYMPHS PCT: 31 % (ref 12–46)
Lymphs Abs: 2.1 10*3/uL (ref 0.7–4.0)
MCH: 33.1 pg (ref 26.0–34.0)
MCHC: 34.1 g/dL (ref 30.0–36.0)
MCV: 97.1 fL (ref 78.0–100.0)
Monocytes Absolute: 0.5 10*3/uL (ref 0.1–1.0)
Monocytes Relative: 7 % (ref 3–12)
NEUTROS PCT: 60 % (ref 43–77)
Neutro Abs: 4.1 10*3/uL (ref 1.7–7.7)
PLATELETS: 347 10*3/uL (ref 150–400)
RBC: 4.53 MIL/uL (ref 3.87–5.11)
RDW: 14.7 % (ref 11.5–15.5)
WBC: 6.8 10*3/uL (ref 4.0–10.5)

## 2013-09-13 MED ORDER — TIOTROPIUM BROMIDE MONOHYDRATE 18 MCG IN CAPS: 18.0000 ug | ORAL_CAPSULE | Freq: Every day | RESPIRATORY_TRACT | Status: DC

## 2013-09-13 MED ORDER — PREDNISONE 50 MG PO TABS: ORAL_TABLET | ORAL | Status: DC

## 2013-09-13 NOTE — Assessment & Plan Note (Signed)
Patient's cough and shortness of breath are most likely caused by COPD exacerbation, which is mild. The other differential diagnoses include pulmonary embolism. Patient is on Megace for weight loss, which puts the patient at increased risk of getting DVT or PE. Patient does not have new tenderness over her calf areas. She does not have any chest pain. Her tachycardia seems to be chronic issue. The oxygen saturation is 98% on room air. Her well's core is low probability for PE. Patient does not have fever or chills. Chest x-ray is negative for infiltration. No leukocytosis. No indication for antibiotics.   -will treat with prednisone 50 mg daily for 5 days -will continue albuterol inhaler and Symbicort inhaler -Will start spirava inhaler.

## 2013-09-13 NOTE — Progress Notes (Signed)
Case discussed with Dr. Niu soon after the resident saw the patient.  We reviewed the resident's history and exam and pertinent patient test results.  I agree with the assessment, diagnosis, and plan of care documented in the resident's note. 

## 2013-09-13 NOTE — Patient Instructions (Signed)
1. Please start taking prednisone 50 mg daily for 5 days 2. Please start using Spiriva inhaler from now.  3. If you have worsening of your symptoms or new symptoms arise, please call the clinic ([phone]), or go to the ER immediately if symptoms are severe.  Please bring in all your medication bottles with you in next visit.

## 2013-09-13 NOTE — Progress Notes (Signed)
Patient ID: [name], female   DOB: May 22, 1961, 53 y.o.   MRN: [id number] Subjective:   Patient ID: [name] female   DOB: Jan 12, 1961 53 y.o.   MRN: [id number]  CC:   Acute visit due to cough and SOB HPI:  [name] is a 53 y.o. lady with past medical history as outlined below, who present for an acute visit today.  Patient reports that she visited the emergency room on 08/13/12 because of cough and shortness of breath. He was diagnosed with COPD exacerbation. She was given prescription of prednisone 40 mg for 5 days and Levaquin 500 mg daily for 10 days. She completed these medications. Her symptoms improved initially. recently her symptoms come back, which have been worsening in the past several days. Currently she has productive cough with white-colored sputum. Feels that her shortness of breath is also worsening. She does not have chest pain, fever or chills. She continues to smoke one pack a day.  Of note, the patient is on Megace for weight loss. She is taking Megace intermittently. She has a mild tenderness over her right lower leg including the calf area, which is chronic and no change in nature per patient. She does not have tenderness over left calf area. No recent long distance traveling history. Currently she is hemodynamically stable with blood pressure 112/76. Heart rate 103. Oxygen saturation 98% on room air.  ROS:  Has cough and SOB. Denies fever, chills, headaches, chest pain, abdominal pain, diarrhea, constipation, dysuria, urgency, frequency, hematuria, joint pain or leg swelling.  Past Medical History  Diagnosis Date  . Anxiety   . Depression   . Cervical cancer     Discovered by pap smear, treated by Dr. Charlesetta Garibaldi with LEEP procedure.  Pap since that time has been normal.  . Chronic back pain      her CT of the head without contrast June 28, 2008- moderate to large right paracentral disc protrusion at C4-C5 resulting in a right C5 neuroforaminal  stenosis and probably some degree of spinal stenosis. C. bilateral C6 neuroforaminal stenosis related to  degenerative hypertrophy, no acute fracture or loose pieces identifying the cervical spine, ligamentous injury is not excluded.    Marland Kitchen Headache(784.0)   . COPD (chronic obstructive pulmonary disease)   . Arthritis   . GERD (gastroesophageal reflux disease)  October 2011     Williams, esophagogram - tiny amount of gastroesophageal reflux otherwise unremarkable exam, done by Dr. Dellis Filbert  . Osteoporosis     T score L femur neck = -2.6 (02/21/12)  . Fibromyalgia   . Hemorrhoids    Current Outpatient Prescriptions  Medication Sig Dispense Refill  . albuterol (PROVENTIL HFA;VENTOLIN HFA) 108 (90 BASE) MCG/ACT inhaler Inhale 2 puffs into the lungs every 6 (six) hours as needed for wheezing or shortness of breath.       . alprazolam (XANAX) 2 MG tablet Take 2 mg by mouth 3 (three) times daily as needed for anxiety.      . budesonide-formoterol (SYMBICORT) 160-4.5 MCG/ACT inhaler Inhale 2 puffs into the lungs 2 (two) times daily.  1 Inhaler  11  . fentaNYL (DURAGESIC - DOSED MCG/HR) 100 MCG/HR Place 1 patch (100 mcg total) onto the skin every other day.  15 patch  0  . Lactulose 20 GM/30ML SOLN Take 20 g by mouth daily as needed (for constipation).      Marland Kitchen levofloxacin (LEVAQUIN) 500 MG tablet Take 1 tablet (500 mg total) by mouth daily.  10 tablet  0  . megestrol (MEGACE ES) 625 MG/5ML suspension Take 5 mLs (625 mg total) by mouth daily.  150 mL  5  . naproxen sodium (ANAPROX) 220 MG tablet Take 220 mg by mouth daily as needed (for pain).      Marland Kitchen NEXIUM 40 MG capsule TAKE ONE CAPSULE BY MOUTH DAILY BEFORE BREAKFAST  30 capsule  11  . oxymorphone (OPANA) 10 MG tablet Take 1 tablet (10 mg total) by mouth 4 (four) times daily as needed for pain.  120 tablet  0  . polyethylene glycol powder (GLYCOLAX/MIRALAX) powder Take 17 g by mouth daily.  255 g  3  . predniSONE (DELTASONE) 10 MG tablet Take 2 tablets  (20 mg total) by mouth daily.  10 tablet  0   Current Facility-Administered Medications  Medication Dose Route Frequency Provider Last Rate Last Dose  . cyanocobalamin ((VITAMIN B-12)) injection 1,000 mcg  1,000 mcg Intramuscular Q30 days Acquanetta Chain, DO   1,000 mcg at 02/24/11 1218   Family History  Problem Relation Age of Onset  . Stroke Mother   . Diabetes Mother   . Heart disease Father   . Lung cancer Daughter    History   Social History  . Marital Status: Divorced    Spouse Name: N/A    Number of Children: N/A  . Years of Education: N/A   Social History Main Topics  . Smoking status: Current Every Day Smoker -- 1.00 packs/day for 34 years    Types: Cigarettes  . Smokeless tobacco: Never Used     Comment: hx drugs non since 95/ TRYING TO QUITS  . Alcohol Use: No  . Drug Use: No  . Sexual Activity: None   Other Topics Concern  . None   Social History Narrative   10th grade education. Married then divorced mid 7's. One daughter. Crack cocaine about 1995 ish. One Mollie [place] admit 2001 for SI. Deemed disabled 04/01/2004 By [facility] due to documented health and mental issues.    Review of Systems: Full 14-point review of systems otherwise negative. See HPI.   Objective:  Physical Exam: Filed Vitals:   09/13/13 1427  BP: 112/76  Pulse: 103  Temp: 97 F (36.1 C)  TempSrc: Oral  Weight: 103 lb 3.2 oz (46.811 kg)  SpO2: 98%   Constitutional: Vital signs reviewed.  Patient is thin,  in no acute distress and cooperative with exam.   HEENT:  Head: Normocephalic and atraumatic Mouth: no erythema or exudates, MMM Eyes: PERRL, EOMI, conjunctivae normal, No scleral icterus.  Neck: Supple, Trachea midline normal ROM, No JVD  Cardiovascular: RRR, S1 normal, S2 normal, no MRG, pulses symmetric and intact bilaterally Pulmonary/Chest: she has mild wheezing over the left lower lung field posteriorly. No rales, or rhonchi. Abdominal: Soft. Non-tender, non-distended,  bowel sounds are normal, no masses, organomegaly, or guarding present.  GU: no CVA tenderness Musculoskeletal: No joint deformities, erythema, or stiffness, ROM full and non-tender Extremities: No leg edema Hematology: no cervical, inginal, or axillary adenopathy.  Neurological: A&O x3, Strength is normal and symmetric bilaterally, cranial nerve II-XII are grossly intact, no focal motor deficit, sensory intact to light touch bilaterally.  Skin: Warm, dry and intact. No rash, cyanosis, or clubbing.  Psychiatric: Normal mood and affect. No suicidal or homicidal ideation.  Assessment & Plan:

## 2013-09-23 ENCOUNTER — Telehealth: Payer: Self-pay | Admitting: *Deleted

## 2013-09-23 NOTE — Telephone Encounter (Signed)
Pt calls stating that she has a bad cough and cant stop coughing, she states she drank 2 bottles of cough syrup and took some pills and none worked, she would like something called in, she was informed that she would need an appt for evaluation, she states she will think about it and call back

## 2013-09-23 NOTE — Telephone Encounter (Signed)
I agree that the patient would need an appointment for evaluation for this issue.  Thank you.

## 2013-10-01 NOTE — Progress Notes (Signed)
Case discussed with Dr. Niu soon after the resident saw the patient.  We reviewed the resident's history and exam and pertinent patient test results.  I agree with the assessment, diagnosis, and plan of care documented in the resident's note. 

## 2013-10-14 ENCOUNTER — Telehealth: Payer: Self-pay | Admitting: *Deleted

## 2013-10-14 NOTE — Telephone Encounter (Signed)
Return pt call - states [facility] is having difficulty getting Opana. The last time,she only received 73 out of 120. I called [facility] - last filled 2/27; pt accepted # 73 tabs, which is 18 d supply. They still have rxs for  3/29 and 4/29 and will not have the supply for 3/29. Pt states she would to keep those rxs there but would like another rx for #120 to take to [facility] on [address], who has Opana at this time. Thanks.

## 2013-10-15 MED ORDER — OXYMORPHONE HCL 10 MG PO TABS: 10.0000 mg | ORAL_TABLET | Freq: Four times a day (QID) | ORAL | Status: DC | PRN

## 2013-10-15 NOTE — Telephone Encounter (Signed)
Rxs ready - pt called / informed. 

## 2013-10-15 NOTE — Telephone Encounter (Signed)
Thank you for this note. -I have written a new prescription for Opana 47 tablets, to make a total of 120 tablets for the prescription originally filled 2/29/15.  The [facility] fill [facility] at [facility]. -I have written a new prescription for Opana 120 tablets (per pain contract), to be filled 10/27/13.  The [facility] -I have called [facility] to inform them of this, and they have destroyed the prescription for Opana to be filled 3/29 at their pharmacy.  Thank you, Elnora Morrison

## 2013-10-30 ENCOUNTER — Ambulatory Visit (INDEPENDENT_AMBULATORY_CARE_PROVIDER_SITE_OTHER): Payer: PRIVATE HEALTH INSURANCE | Admitting: Internal Medicine

## 2013-10-30 ENCOUNTER — Encounter: Payer: Self-pay | Admitting: Internal Medicine

## 2013-10-30 VITALS — BP 98/75 | HR 99 | Temp 98.3°F | Ht 67.0 in | Wt 103.9 lb

## 2013-10-30 DIAGNOSIS — F17200 Nicotine dependence, unspecified, uncomplicated: Secondary | ICD-10-CM

## 2013-10-30 DIAGNOSIS — L659 Nonscarring hair loss, unspecified: Secondary | ICD-10-CM | POA: Insufficient documentation

## 2013-10-30 DIAGNOSIS — F419 Anxiety disorder, unspecified: Secondary | ICD-10-CM

## 2013-10-30 DIAGNOSIS — F411 Generalized anxiety disorder: Secondary | ICD-10-CM

## 2013-10-30 DIAGNOSIS — Z720 Tobacco use: Secondary | ICD-10-CM

## 2013-10-30 MED ORDER — OXYMORPHONE HCL 10 MG PO TABS: 10.0000 mg | ORAL_TABLET | Freq: Four times a day (QID) | ORAL | Status: DC | PRN

## 2013-10-30 MED ORDER — FENTANYL 100 MCG/HR TD PT72: 100.0000 ug | MEDICATED_PATCH | TRANSDERMAL | Status: DC

## 2013-10-30 NOTE — Patient Instructions (Signed)
General Instructions: Congratulations on cutting back on smoking!  Quitting smoking is the best thing you can do for your health.  Please return for a follow-up visit in 3 months.   Treatment Goals:  Goals (1 Years of Data) as of 10/30/13   None      Progress Toward Treatment Goals:  Treatment Goal 10/30/2013  Stop smoking smoking the same amount  Prevent falls -    Self Care Goals & Plans:  Self Care Goal 10/30/2013  Manage my medications take my medicines as prescribed; bring my medications to every visit; refill my medications on time  Monitor my health keep track of my weight  Eat healthy foods eat more vegetables  Be physically active -  Stop smoking cut down the number of cigarettes smoked    No flowsheet data found.   Care Management & Community Referrals:  Referral 10/30/2013  Referrals made for care management support none needed

## 2013-10-30 NOTE — Assessment & Plan Note (Signed)
The patient has a history of depression and anxiety.  She has been, and remains, resistant to seeing psychiatry or starting a long-term medication for this.  She notes SI with SSRI's in the past, and adverse reactions to several medications.  At this point, I do not think it would be in the patient's best interests to increase her xanax.  I encouraged the patient to consider following up with [facility], but she declined. -continue xanax at current dose

## 2013-10-30 NOTE — Progress Notes (Signed)
HPI The patient is a 53 y.o. female with a history of Depression, chronic pain, B12 deficiency, COPD, tobacco abuse, osteoporosis, presenting for a routine follow-up visit.  The patient continues to note problems with anxiety, noting difficulty going into crowded places do to increase in anxiety. The patient has been using Xanax 3 times per day, but occasionally uses more than this and finds herself running out at the end of the month. She asked for an increase in Xanax. She has tried SSRIs in the past, but noted suicidal ideation.  The patient also notes an area of hair loss on her scalp, with mild pruritus but no pain.  She notes going to a dermatologist, who diagnosed her with psoriasis, and gave her a shampoo to use (name unknown).  She believes the area hasn't particularly grown since that time.  She notes no similar patches anywhere else on her body, and no family history of similar lesions.  The patient notes that she has been cutting back on smoking through prayer, now smoking just under 1 ppd.  She notes craving the taste of cigarettes.  ROS: General: no fevers, chills, changes in weight, changes in appetite Skin: no rash HEENT: no blurry vision, hearing changes, sore throat Pulm: no dyspnea, coughing, wheezing CV: no chest pain, palpitations, shortness of breath Abd: no abdominal pain, nausea/vomiting, diarrhea/constipation GU: no dysuria, hematuria, polyuria Ext: no arthralgias, myalgias Neuro: no weakness, numbness, or tingling  Filed Vitals:   10/30/13 1404  BP: 98/75  Pulse: 99  Temp: 98.3 F (36.8 C)    PEX General: alert, cooperative, and in no apparent distress HEENT: PERRL, EOMI, vertex and right scalp with large area of hair loss, scaling, and hypopigmentation Neck: supple Lungs: clear to ascultation bilaterally, normal work of respiration, no wheezes, rales, ronchi Heart: regular rate and rhythm, no murmurs, gallops, or rubs Abdomen: soft, non-tender,  non-distended, normal bowel sounds Extremities: no cyanosis, clubbing, or edema Neurologic: alert & oriented X3, cranial nerves II-XII intact, strength grossly intact, sensation intact to light touch  Current Outpatient Prescriptions on File Prior to Visit  Medication Sig Dispense Refill  . albuterol (PROVENTIL HFA;VENTOLIN HFA) 108 (90 BASE) MCG/ACT inhaler Inhale 2 puffs into the lungs every 6 (six) hours as needed for wheezing or shortness of breath.       . alprazolam (XANAX) 2 MG tablet Take 2 mg by mouth 3 (three) times daily as needed for anxiety.      . budesonide-formoterol (SYMBICORT) 160-4.5 MCG/ACT inhaler Inhale 2 puffs into the lungs 2 (two) times daily.  1 Inhaler  11  . fentaNYL (DURAGESIC - DOSED MCG/HR) 100 MCG/HR Place 1 patch (100 mcg total) onto the skin every other day.  15 patch  0  . Lactulose 20 GM/30ML SOLN Take 20 g by mouth daily as needed (for constipation).      . megestrol (MEGACE ES) 625 MG/5ML suspension Take 5 mLs (625 mg total) by mouth daily.  150 mL  5  . naproxen sodium (ANAPROX) 220 MG tablet Take 220 mg by mouth daily as needed (for pain).      Marland Kitchen NEXIUM 40 MG capsule TAKE ONE CAPSULE BY MOUTH DAILY BEFORE BREAKFAST  30 capsule  11  . oxymorphone (OPANA) 10 MG tablet Take 1 tablet (10 mg total) by mouth 4 (four) times daily as needed for pain.  120 tablet  0  . polyethylene glycol powder (GLYCOLAX/MIRALAX) powder Take 17 g by mouth daily.  255 g  3  .  predniSONE (DELTASONE) 50 MG tablet Please take prednisone 50 mg daily for 5 days.  5 tablet  0  . tiotropium (SPIRIVA HANDIHALER) 18 MCG inhalation capsule Place 1 capsule (18 mcg total) into inhaler and inhale daily.  30 capsule  2   Current Facility-Administered Medications on File Prior to Visit  Medication Dose Route Frequency Provider Last Rate Last Dose  . cyanocobalamin ((VITAMIN B-12)) injection 1,000 mcg  1,000 mcg Intramuscular Q30 days Acquanetta Chain, DO   1,000 mcg at 02/24/11 1218     Assessment/Plan

## 2013-10-30 NOTE — Assessment & Plan Note (Signed)
  Assessment: Progress toward smoking cessation:  smoking the same amount Barriers to progress toward smoking cessation:  withdrawal symptoms Comments: Pt continues to desire to quit smoking.  Still does not want NRT's, wellbutrin, or chantix  Plan: Instruction/counseling given:  I counseled patient on the dangers of tobacco use, advised patient to stop smoking, and reviewed strategies to maximize success. Educational resources provided:    Self management tools provided:   (PATIENT CUTTING BACK ON HER OWN) Medications to assist with smoking cessation:  None Patient agreed to the following self-care plans for smoking cessation: cut down the number of cigarettes smoked  Other plans: Reassess at next visit

## 2013-10-30 NOTE — Assessment & Plan Note (Signed)
The patient is noted to have new onset of discrete area of alopecia with scale and hypopigmentation.  She has seen dermatology, who reportedly diagnosed her with psoriasis.  Differential also includes fungal infections vs vitiligo, among others. -pt is to continue to follow-up with dermatology -if pt stops going to dermatology, we can pick up the work-up of this condition

## 2013-10-31 NOTE — Progress Notes (Signed)
INTERNAL MEDICINE TEACHING ATTENDING ADDENDUM - Nischal Narendra, MD: I reviewed and discussed at the time of visit with the resident Dr. Brown, the patient's medical history, physical examination, diagnosis and results of tests and treatment and I agree with the patient's care as documented. 

## 2013-12-09 ENCOUNTER — Other Ambulatory Visit: Payer: Self-pay | Admitting: Internal Medicine

## 2013-12-26 ENCOUNTER — Telehealth: Payer: Self-pay | Admitting: *Deleted

## 2013-12-26 NOTE — Telephone Encounter (Signed)
Pt is requesting oral lidocaine to rub on her gums before she puts her teeth in

## 2013-12-26 NOTE — Telephone Encounter (Signed)
Unfortunately, I believe the issue of denture complications is beyond the scope of practice of an Internal Medicine clinic.  I recommend that the patient follow-up with her dentist for this issue.

## 2013-12-27 NOTE — Telephone Encounter (Signed)
Called pt and advised her to call her dentist, she was agreeable

## 2014-01-08 ENCOUNTER — Encounter: Payer: Self-pay | Admitting: Internal Medicine

## 2014-01-08 ENCOUNTER — Ambulatory Visit (INDEPENDENT_AMBULATORY_CARE_PROVIDER_SITE_OTHER): Payer: PRIVATE HEALTH INSURANCE | Admitting: Internal Medicine

## 2014-01-08 VITALS — BP 100/77 | HR 73 | Temp 96.9°F | Ht 67.0 in | Wt 98.1 lb

## 2014-01-08 DIAGNOSIS — R0602 Shortness of breath: Secondary | ICD-10-CM

## 2014-01-08 DIAGNOSIS — J449 Chronic obstructive pulmonary disease, unspecified: Secondary | ICD-10-CM

## 2014-01-08 DIAGNOSIS — Z720 Tobacco use: Secondary | ICD-10-CM

## 2014-01-08 DIAGNOSIS — G894 Chronic pain syndrome: Secondary | ICD-10-CM

## 2014-01-08 DIAGNOSIS — F17200 Nicotine dependence, unspecified, uncomplicated: Secondary | ICD-10-CM

## 2014-01-08 MED ORDER — OXYMORPHONE HCL 10 MG PO TABS: 10.0000 mg | ORAL_TABLET | Freq: Four times a day (QID) | ORAL | Status: DC | PRN

## 2014-01-08 MED ORDER — ALBUTEROL SULFATE HFA 108 (90 BASE) MCG/ACT IN AERS: 1.0000 | INHALATION_SPRAY | RESPIRATORY_TRACT | Status: DC | PRN

## 2014-01-08 MED ORDER — ESOMEPRAZOLE MAGNESIUM 40 MG PO CPDR: 40.0000 mg | DELAYED_RELEASE_CAPSULE | Freq: Every day | ORAL | Status: DC

## 2014-01-08 MED ORDER — FENTANYL 100 MCG/HR TD PT72: 100.0000 ug | MEDICATED_PATCH | TRANSDERMAL | Status: DC

## 2014-01-08 MED ORDER — TIOTROPIUM BROMIDE MONOHYDRATE 18 MCG IN CAPS: 18.0000 ug | ORAL_CAPSULE | Freq: Every day | RESPIRATORY_TRACT | Status: DC

## 2014-01-08 MED ORDER — BUPROPION HCL ER (XL) 150 MG PO TB24: 150.0000 mg | ORAL_TABLET | Freq: Two times a day (BID) | ORAL | Status: DC

## 2014-01-08 MED ORDER — BUDESONIDE-FORMOTEROL FUMARATE 160-4.5 MCG/ACT IN AERO: 2.0000 | INHALATION_SPRAY | Freq: Two times a day (BID) | RESPIRATORY_TRACT | Status: DC

## 2014-01-08 NOTE — Assessment & Plan Note (Signed)
  Assessment: Progress toward smoking cessation:  smoking the same amount Barriers to progress toward smoking cessation:  withdrawal symptoms Comments: The patient is motivated to quit smoking.  We will try Bupropion  Plan: Instruction/counseling given:  I counseled patient on the dangers of tobacco use, advised patient to stop smoking, and reviewed strategies to maximize success. Educational resources provided:    Self management tools provided:    Medications to assist with smoking cessation:  Bupropion (Zyban) Patient agreed to the following self-care plans for smoking cessation:    Other plans: We will start Bupropion for smoking cessation.  The patient was counseled on the warning that antidepressants may increase suicidal ideation, and that she is to stop this medication and seek immediate medical care if she develops thoughts of suicidality.

## 2014-01-08 NOTE — Progress Notes (Signed)
HPI The patient is a 53 y.o. female with a history of chronic pain, COPD, tobacco abuse, presenting for a follow-up visit for COPD.  The patient has a history of COPD, GOLD stage 4 (per PFT's 2011).  She notes gradual progression of symptoms of worsening dyspnea over the past few years, stating it is difficult to "suck in the air" for her spiriva inhaler.  She is using pro-air twice per day because of the instructions on the inhaler (not because of SOB).  She is not using spiriva or symbicort.  She has been using O2 at night, 4 L.  She continues to smoke cigarettes.  The patient has a history of tobacco abuse.  In the past, she has tried Chantix, nicotine patches, and nicotine gum without success.  She notes a renewed motivation to quit, worrying that her niece will copy her and start smoking.  ROS: General: no fevers, chills, changes in weight, changes in appetite Skin: no rash HEENT: no blurry vision, hearing changes, sore throat Pulm: see HPI CV: no chest pain, palpitations, shortness of breath Abd: no abdominal pain, nausea/vomiting, diarrhea/constipation GU: no dysuria, hematuria, polyuria Ext: no arthralgias, myalgias Neuro: no weakness, numbness, or tingling  Filed Vitals:   01/08/14 1412  BP: 100/77  Pulse: 73  Temp: 96.9 F (36.1 C)    PEX General: alert, cooperative, and in no apparent distress HEENT: pupils equal round and reactive to light, vision grossly intact, oropharynx clear and non-erythematous  Neck: supple Lungs: clear to ascultation bilaterally, normal work of respiration, cough induced by deep breathing Heart: regular rate and rhythm, no murmurs, gallops, or rubs Abdomen: soft, non-tender, non-distended, normal bowel sounds Extremities: no cyanosis, clubbing, or edema Neurologic: alert & oriented X3, cranial nerves II-XII intact, strength grossly intact, sensation intact to light touch  Current Outpatient Prescriptions on File Prior to Visit  Medication Sig  Dispense Refill  . albuterol (PROVENTIL HFA;VENTOLIN HFA) 108 (90 BASE) MCG/ACT inhaler Inhale 2 puffs into the lungs every 6 (six) hours as needed for wheezing or shortness of breath.       . alprazolam (XANAX) 2 MG tablet Take 2 mg by mouth 3 (three) times daily as needed for anxiety.      . budesonide-formoterol (SYMBICORT) 160-4.5 MCG/ACT inhaler Inhale 2 puffs into the lungs 2 (two) times daily.  1 Inhaler  11  . fentaNYL (DURAGESIC - DOSED MCG/HR) 100 MCG/HR Place 1 patch (100 mcg total) onto the skin every other day.  15 patch  0  . Lactulose 20 GM/30ML SOLN Take 20 g by mouth daily as needed (for constipation).      . megestrol (MEGACE ES) 625 MG/5ML suspension Take 5 mLs (625 mg total) by mouth daily.  150 mL  5  . NEXIUM 40 MG capsule TAKE ONE CAPSULE BY MOUTH DAILY BEFORE BREAKFAST  30 capsule  11  . oxymorphone (OPANA) 10 MG tablet Take 1 tablet (10 mg total) by mouth 4 (four) times daily as needed for pain.  120 tablet  0  . polyethylene glycol powder (GLYCOLAX/MIRALAX) powder Take 17 g by mouth daily.  255 g  3  . PROAIR HFA 108 (90 BASE) MCG/ACT inhaler INHALE 1-2 PUFFS INTO THE LUNGS EVERY FOUR HOURS AS NEEDED FOR SHORTNESS OF BREATH  8.5 g  11  . tiotropium (SPIRIVA HANDIHALER) 18 MCG inhalation capsule Place 1 capsule (18 mcg total) into inhaler and inhale daily.  30 capsule  2   Current Facility-Administered Medications on File Prior to  Visit  Medication Dose Route Frequency Provider Last Rate Last Dose  . cyanocobalamin ((VITAMIN B-12)) injection 1,000 mcg  1,000 mcg Intramuscular Q30 days Acquanetta Chain, DO   1,000 mcg at 02/24/11 1218    Assessment/Plan

## 2014-01-08 NOTE — Assessment & Plan Note (Signed)
Medications refilled per pain contract, through 04/25/14

## 2014-01-08 NOTE — Patient Instructions (Addendum)
General Instructions: For your COPD: -use your Symbicort inhaler twice per day every day -try to use your Spiriva inhaler once per day, as many days as you can -use your Proair only when needed  To help you quit smoking, we are prescribing Bupropion -for the first 3 weeks, just take 1 tablet once per day -after that, take 1 tablet twice per day, for 3 months.  For your chronic pain, we have refilled your pain medications.  Please return for a follow-up visit in 3-4 months.   Treatment Goals:  Goals (1 Years of Data) as of 01/08/14   None      Progress Toward Treatment Goals:  Treatment Goal 01/08/2014  Stop smoking smoking the same amount  Prevent falls -    Self Care Goals & Plans:  Self Care Goal 10/30/2013  Manage my medications take my medicines as prescribed; bring my medications to every visit; refill my medications on time  Monitor my health keep track of my weight  Eat healthy foods eat more vegetables  Be physically active -  Stop smoking cut down the number of cigarettes smoked    No flowsheet data found.   Care Management & Community Referrals:  Referral 01/08/2014  Referrals made for care management support none needed

## 2014-01-08 NOTE — Assessment & Plan Note (Signed)
The patient has a history of COPD, GOLD stage 3.  She notes gradual worsening of symptoms, which is unsurprising given non-compliance with Symbicort and Spiriva, and continued tobacco abuse.  We discussed COPD as a disease process, and stressed the importance of medication compliance and smoking cessation.  The patient appears moderately motivated to improve her lung functioning. -re-sent prescriptions for spiriva and symbicort -see smoking cessation above -instructed the patient to use albuterol only PRN, not BID as she had been doing

## 2014-01-08 NOTE — Progress Notes (Signed)
Case discussed with Dr. Brown at the time of the visit.  We reviewed the resident's history and exam and pertinent patient test results.  I agree with the assessment, diagnosis, and plan of care documented in the resident's note. 

## 2014-01-09 ENCOUNTER — Other Ambulatory Visit: Payer: Self-pay | Admitting: *Deleted

## 2014-01-09 NOTE — Telephone Encounter (Signed)
Pt requesting refills for July, Aug, and Sept. Thanks

## 2014-01-10 MED ORDER — ALPRAZOLAM 2 MG PO TABS: 2.0000 mg | ORAL_TABLET | Freq: Three times a day (TID) | ORAL | Status: DC | PRN

## 2014-01-10 NOTE — Telephone Encounter (Signed)
Xanax rx called to [facility].

## 2014-02-03 ENCOUNTER — Encounter: Payer: Self-pay | Admitting: Internal Medicine

## 2014-02-03 ENCOUNTER — Ambulatory Visit (INDEPENDENT_AMBULATORY_CARE_PROVIDER_SITE_OTHER): Payer: PRIVATE HEALTH INSURANCE | Admitting: Internal Medicine

## 2014-02-03 VITALS — BP 109/81 | HR 73 | Temp 97.0°F | Ht 67.0 in | Wt 94.6 lb

## 2014-02-03 DIAGNOSIS — F1920 Other psychoactive substance dependence, uncomplicated: Secondary | ICD-10-CM

## 2014-02-03 DIAGNOSIS — J449 Chronic obstructive pulmonary disease, unspecified: Secondary | ICD-10-CM

## 2014-02-03 DIAGNOSIS — M810 Age-related osteoporosis without current pathological fracture: Secondary | ICD-10-CM

## 2014-02-03 DIAGNOSIS — R636 Underweight: Secondary | ICD-10-CM

## 2014-02-03 DIAGNOSIS — F3289 Other specified depressive episodes: Secondary | ICD-10-CM

## 2014-02-03 DIAGNOSIS — Z720 Tobacco use: Secondary | ICD-10-CM

## 2014-02-03 DIAGNOSIS — F329 Major depressive disorder, single episode, unspecified: Secondary | ICD-10-CM

## 2014-02-03 DIAGNOSIS — L659 Nonscarring hair loss, unspecified: Secondary | ICD-10-CM

## 2014-02-03 DIAGNOSIS — F1120 Opioid dependence, uncomplicated: Secondary | ICD-10-CM

## 2014-02-03 DIAGNOSIS — F17200 Nicotine dependence, unspecified, uncomplicated: Secondary | ICD-10-CM

## 2014-02-03 NOTE — Assessment & Plan Note (Signed)
Likely related to depression and a spiraling cycle of chronic xanax and opioid use.   -wean off xanax and opiods -referral to psychiatry -will refer to PT in the future for chronic pain and discuss other options for pain control

## 2014-02-03 NOTE — Assessment & Plan Note (Signed)
Pt has a h/o polysubstance abuse including crack-cocaine, THC, and prescription drugs.   -will need to do our part to wean patient off xanax and chronic opiods (research supports that opiods are NOT effective treatments for long-term pain control)--will refer to PT in the future and utilize other modalities

## 2014-02-03 NOTE — Assessment & Plan Note (Signed)
-  will assess at next visit

## 2014-02-03 NOTE — Assessment & Plan Note (Signed)
Does not seem interested in quitting.  Smokes 1.5 ppd.   -encourage cessation

## 2014-02-03 NOTE — Assessment & Plan Note (Signed)
-  will inquire at next visit if patient is taking VitD-calcium and will need to check VitD if this has not been previously done

## 2014-02-03 NOTE — Progress Notes (Signed)
Patient ID: [name], female   DOB: [birth year], 53 y.o.   MRN: [id number]    Subjective:   Patient ID: [name] female    DOB: [birth year] 53 y.o.    MRN: [id number] Health Maintenance Due: Health Maintenance Due  Topic Date Due  . Tetanus/tdap  01/19/1978    _________________________________________________  HPI: [name] is a 53 y.o. female here for an acute visit for congestion. She is a new patient to me.  Pt has a PMH outlined below.  Please see problem-based charting assessment and plan note for further details of medical issues addressed at today's visit.  PMH: Past Medical History  Diagnosis Date  . Anxiety   . Depression   . Cervical cancer     Discovered by pap smear, treated by Dr. Charlesetta Garibaldi with LEEP procedure.  Pap since that time has been normal.  . Chronic back pain      her CT of the head without contrast June 28, 2008- moderate to large right paracentral disc protrusion at C4-C5 resulting in a right C5 neuroforaminal stenosis and probably some degree of spinal stenosis. C. bilateral C6 neuroforaminal stenosis related to  degenerative hypertrophy, no acute fracture or loose pieces identifying the cervical spine, ligamentous injury is not excluded.    Marland Kitchen Headache(784.0)   . COPD (chronic obstructive pulmonary disease)   . Arthritis   . GERD (gastroesophageal reflux disease)  October 2011     Williams, esophagogram - tiny amount of gastroesophageal reflux otherwise unremarkable exam, done by Dr. Dellis Filbert  . Osteoporosis     T score L femur neck = -2.6 (02/21/12)  . Fibromyalgia   . Hemorrhoids     Medications: Current Outpatient Prescriptions on File Prior to Visit  Medication Sig Dispense Refill  . albuterol (PROAIR HFA) 108 (90 BASE) MCG/ACT inhaler Inhale 1 puff into the lungs every 4 (four) hours as needed for wheezing or shortness of breath.  8.5 g  11  . alprazolam (XANAX) 2 MG tablet Take 1 tablet (2 mg total) by mouth 3 (three) times  daily as needed for anxiety.  90 tablet  5  . budesonide-formoterol (SYMBICORT) 160-4.5 MCG/ACT inhaler Inhale 2 puffs into the lungs 2 (two) times daily.  1 Inhaler  11  . buPROPion (WELLBUTRIN XL) 150 MG 24 hr tablet Take 1 tablet (150 mg total) by mouth 2 (two) times daily. For the first 3 weeks, only take once per day  60 tablet  2  . esomeprazole (NEXIUM) 40 MG capsule Take 1 capsule (40 mg total) by mouth daily.  30 capsule  11  . fentaNYL (DURAGESIC - DOSED MCG/HR) 100 MCG/HR Place 1 patch (100 mcg total) onto the skin every other day.  15 patch  0  . Lactulose 20 GM/30ML SOLN Take 20 g by mouth daily as needed (for constipation).      . megestrol (MEGACE ES) 625 MG/5ML suspension Take 5 mLs (625 mg total) by mouth daily.  150 mL  5  . oxymorphone (OPANA) 10 MG tablet Take 1 tablet (10 mg total) by mouth 4 (four) times daily as needed for pain.  120 tablet  0  . polyethylene glycol powder (GLYCOLAX/MIRALAX) powder Take 17 g by mouth daily.  255 g  3  . tiotropium (SPIRIVA HANDIHALER) 18 MCG inhalation capsule Place 1 capsule (18 mcg total) into inhaler and inhale daily.  30 capsule  11   Current Facility-Administered Medications on File Prior to Visit  Medication Dose  Route Frequency Provider Last Rate Last Dose  . cyanocobalamin ((VITAMIN B-12)) injection 1,000 mcg  1,000 mcg Intramuscular Q30 days Acquanetta Chain, DO   1,000 mcg at 02/24/11 1218    Allergies: Allergies  Allergen Reactions  . Aripiprazole Other (See Comments)    Suicidal ideation, also with SSRI  . Chantix [Varenicline] Other (See Comments)    Irritability and depressed mood  . Cymbalta [Duloxetine Hcl] Other (See Comments)    Irritability. 3/11  . Pregabalin Hives    On trunk of body  . Zolpidem Tartrate Other (See Comments)    unknown    FH: Family History  Problem Relation Age of Onset  . Stroke Mother   . Diabetes Mother   . Heart disease Father   . Lung cancer Daughter     SH: History    Social History  . Marital Status: Divorced    Spouse Name: N/A    Number of Children: N/A  . Years of Education: N/A   Social History Main Topics  . Smoking status: Current Every Day Smoker -- 1.00 packs/day for 34 years    Types: Cigarettes  . Smokeless tobacco: Never Used     Comment: hx drugs non since 95/ TRYING TO QUITS/ HAS CUT BACK  . Alcohol Use: No  . Drug Use: No  . Sexual Activity: None   Other Topics Concern  . None   Social History Narrative   10th grade education. Married then divorced mid 75's. One daughter. Crack cocaine about 1995 ish. One Mollie [place] admit 2001 for SI. Deemed disabled 04/01/2004 By [facility] due to documented health and mental issues.    Review of Systems: Constitutional: Negative for fever, chills and +weight loss.  Eyes: Negative for blurred vision.  Respiratory: Negative for cough and shortness of breath.  Cardiovascular: Negative for chest pain, palpitations and leg swelling.  Gastrointestinal: Negative for nausea, vomiting, abdominal pain, diarrhea, constipation and blood in stool.  Genitourinary: Negative for dysuria, urgency and frequency.  Musculoskeletal: Negative for myalgias and back pain.  Neurological: Negative for dizziness, weakness and headaches.     Objective:   Vital Signs: Filed Vitals:   02/03/14 1453  BP: 109/81  Pulse: 73  Temp: 97 F (36.1 C)  TempSrc: Oral  Height: 5\' 7"  (1.702 m)  Weight: 94 lb 9.6 oz (42.91 kg)  SpO2: 94%      BP Readings from Last 3 Encounters:  02/03/14 109/81  01/08/14 100/77  10/30/13 98/75    Physical Exam: Constitutional: Vital signs reviewed.  Patient appears much older than her age, is cachetic and fails to make eye contact. Head: temporal wasting, scalp rash Eyes: PERRL, EOMI, conjunctivae nl, no scleral icterus.  Cardiovascular: RRR, no MRG. Pulmonary/Chest: normal effort, non-tender to palpation,CTAB but decreased breath sounds, without wheezes, rales, or  rhonchi. Abdominal: Thin. Soft. NT/ND +BS. Neurological: A&O x3, cranial nerves II-XII are grossly intact, moving all extremities, ambulating with a cane Extremities: 2+DP b/l; no pitting edema. Skin: Warm, dry and intact.   Most Recent Laboratory Results:   CBC    Component Value Date/Time   WBC 6.8 09/13/2013 1522   RBC 4.53 09/13/2013 1522   HGB 15.0 09/13/2013 1522   HCT 44.0 09/13/2013 1522   PLT 347 09/13/2013 1522   MCV 97.1 09/13/2013 1522   MCH 33.1 09/13/2013 1522   MCHC 34.1 09/13/2013 1522   RDW 14.7 09/13/2013 1522   LYMPHSABS 2.1 09/13/2013 1522   MONOABS 0.5 09/13/2013 1522  EOSABS 0.1 09/13/2013 1522   BASOSABS 0.0 09/13/2013 1522    Lipid Panel Lab Results  Component Value Date   CHOL 177 08/27/2010   HDL 55 08/27/2010   LDLCALC 110* 08/27/2010   TRIG 58 08/27/2010   CHOLHDL 3.2 Ratio 08/27/2010    Assessment & Plan:   Assessment and plan was discussed and formulated with my attending.  Patient should return to the [facility] in 4 month(s).   Topics to be addressed at next visit include:  I do not feel comfortable prescribing opiods for a patient who has a history of crack-cocaine abuse, THC use, alcohol use, and a current smoker.  These are all red flags and further management of her chronic pain will need to be addressed.  Physical therapy and other modalities will need to be discussed.

## 2014-02-03 NOTE — Assessment & Plan Note (Addendum)
Pt comes in for worsening congestion for the past week.  She is a very chronically ill appearing, cachetic female who appears much older than her given age.  She does not take wellbutrin which she is prescribed for unknown reasons but is willing to take xanax which she is prescribed 2mg  tid.  I am not comfortable continuing to prescribe xanax given the patient's chronically ill appearance combined with weight loss.  Apparently she also has a significant history of refusing SSRIs and mental health.  Her affect is very flat affect, is slow to respond, and fails to make eye contact.  She gets upset when I express to her the harms of continuing her on xanax and explaining that this medication is to be used short term only.  She reports she has been on this for over 10 years.  I explained to her that benzodiazepines have negative effects on cognition and increase her risk of falls, not to mention it is on the Beers criteria that is inappropriate for older adults.   -referral to psychiatry -will plan to wean off of xanax (especially in the context of a pt with h/o polysubstance abuse-they are at extremely high risk for abuse and dependence) --apparently this has been tried in the past but it is in the patient's best interest and health

## 2014-02-03 NOTE — Assessment & Plan Note (Signed)
Pt with COPD, GOLD stage 3.  She reports increased congestion for the past week without increased cough, sputum production, or fever/chills. She has been taking OTC remedies which provides some relief (guaifenesin).  She reports baseline SOB, no wheezing.  Also reports that she was told by Dr. Owens Shark to use proair twice daily, however, this is inaccurate.  Per Dr. Saul Fordyce notes has has instructed her to use proair PRN.  She reports compliance with symbicort but states she cant get enough breath to inhale spiriva.  She continues to smoke 1/2 ppd.  She does not seem interested in quitting.   -instructed to use albuterol only PRN -continue symbicort--encouraged use of spiriva -continue guaifenesin as needed

## 2014-02-03 NOTE — Patient Instructions (Signed)
Thank you for your visit today.  Please return to the internal medicine clinic in 4 month(s) or sooner if needed.     Your current medical regimen is effective;  continue present plan and take all medications as prescribed.    You may take guaifenisin for congestion as directed on the bottle.   I have made the following referrals for you: Psychiatry for management of your anxiety.   Please be sure to bring all of your medications with you to every visit; this includes herbal supplements, vitamins, eye drops, and any over-the-counter medications.   Should you have any questions regarding your medications and/or any new or worsening symptoms, please be sure to call the clinic at [phone].   If you believe that you are suffering from a life threatening condition or one that may result in the loss of limb or function, then you should call 911 or proceed to the nearest Emergency Department.     A healthy lifestyle and preventative care can promote health and wellness.   Maintain regular health, dental, and eye exams.  Eat a healthy diet. Foods like vegetables, fruits, whole grains, low-fat dairy products, and lean protein foods contain the nutrients you need without too many calories. Decrease your intake of foods high in solid fats, added sugars, and salt. Get information about a proper diet from your caregiver, if necessary.  Regular physical exercise is one of the most important things you can do for your health. Most adults should get at least 150 minutes of moderate-intensity exercise (any activity that increases your heart rate and causes you to sweat) each week. In addition, most adults need muscle-strengthening exercises on 2 or more days a week.   Maintain a healthy weight. The body mass index (BMI) is a screening tool to identify possible weight problems. It provides an estimate of body fat based on height and weight. Your caregiver can help determine your BMI, and can help you  achieve or maintain a healthy weight. For adults 20 years and older:  A BMI below 18.5 is considered underweight.  A BMI of 18.5 to 24.9 is normal.  A BMI of 25 to 29.9 is considered overweight.  A BMI of 30 and above is considered obese.

## 2014-02-04 NOTE — Progress Notes (Signed)
INTERNAL MEDICINE TEACHING ATTENDING ADDENDUM - Linley Moxley, MD: I reviewed and discussed at the time of visit with the resident Dr. Gill, the patient's medical history, physical examination, diagnosis and results of pertinent tests and treatment and I agree with the patient's care as documented.  

## 2014-02-17 ENCOUNTER — Other Ambulatory Visit: Payer: Self-pay | Admitting: Internal Medicine

## 2014-02-17 DIAGNOSIS — L659 Nonscarring hair loss, unspecified: Secondary | ICD-10-CM

## 2014-02-20 ENCOUNTER — Encounter: Payer: Self-pay | Admitting: Internal Medicine

## 2014-02-20 ENCOUNTER — Ambulatory Visit (INDEPENDENT_AMBULATORY_CARE_PROVIDER_SITE_OTHER): Payer: PRIVATE HEALTH INSURANCE | Admitting: Internal Medicine

## 2014-02-20 VITALS — BP 110/75 | HR 99 | Temp 96.6°F | Ht 67.0 in | Wt 95.3 lb

## 2014-02-20 DIAGNOSIS — R636 Underweight: Secondary | ICD-10-CM

## 2014-02-20 DIAGNOSIS — R1310 Dysphagia, unspecified: Secondary | ICD-10-CM

## 2014-02-20 DIAGNOSIS — G894 Chronic pain syndrome: Secondary | ICD-10-CM

## 2014-02-20 LAB — COMPLETE METABOLIC PANEL WITH GFR
ALT: <8 U/L (ref 0–35)
AST: 17 U/L (ref 0–37)
Albumin: 4.2 g/dL (ref 3.5–5.2)
Alkaline Phosphatase: 97 U/L (ref 39–117)
BUN: 11 mg/dL (ref 6–23)
CALCIUM: 9.7 mg/dL (ref 8.4–10.5)
CO2: 29 meq/L (ref 19–32)
CREATININE: 0.61 mg/dL (ref 0.50–1.10)
Chloride: 94 mEq/L — ABNORMAL LOW (ref 96–112)
GFR, Est Non African American: >89 mL/min
Glucose, Bld: 87 mg/dL (ref 70–99)
Potassium: 5.5 mEq/L — ABNORMAL HIGH (ref 3.5–5.3)
Sodium: 133 mEq/L — ABNORMAL LOW (ref 135–145)
Total Bilirubin: 0.5 mg/dL (ref 0.2–1.2)
Total Protein: 7.7 g/dL (ref 6.0–8.3)

## 2014-02-20 LAB — CBC WITH DIFFERENTIAL/PLATELET
BASOS ABS: 0 10*3/uL (ref 0.0–0.1)
Basophils Relative: 0 % (ref 0–1)
EOS PCT: 3 % (ref 0–5)
Eosinophils Absolute: 0.1 10*3/uL (ref 0.0–0.7)
HCT: 41 % (ref 36.0–46.0)
Hemoglobin: 14.4 g/dL (ref 12.0–15.0)
LYMPHS ABS: 1.5 10*3/uL (ref 0.7–4.0)
Lymphocytes Relative: 35 % (ref 12–46)
MCH: 32.3 pg (ref 26.0–34.0)
MCHC: 35.1 g/dL (ref 30.0–36.0)
MCV: 91.9 fL (ref 78.0–100.0)
MONO ABS: 0.4 10*3/uL (ref 0.1–1.0)
Monocytes Relative: 9 % (ref 3–12)
Neutro Abs: 2.3 10*3/uL (ref 1.7–7.7)
Neutrophils Relative %: 53 % (ref 43–77)
PLATELETS: 357 10*3/uL (ref 150–400)
RBC: 4.46 MIL/uL (ref 3.87–5.11)
RDW: 14.1 % (ref 11.5–15.5)
WBC: 4.4 10*3/uL (ref 4.0–10.5)

## 2014-02-20 LAB — TSH: TSH: 0.808 u[IU]/mL (ref 0.350–4.500)

## 2014-02-20 NOTE — Patient Instructions (Signed)
Follow up with nutritionist and speech therapist as recommended. Take all the medications as recommended below. Drink plenty of fluids every day. Drink Boost or Ensure three times daily. Follow up in 2 weeks.

## 2014-02-20 NOTE — Assessment & Plan Note (Signed)
Unclear etiology currently. Differentials include Poor intake (secondary to depression, chronic dependence on opiates making her lethargic most of the day) vs ? Malignancy of unknown etiology. Patient had EGD, Colonoscopy in 2009/2010 which were essentially normal. Patient symptoms of difficulty swallowing solids are concerning for esophageal pathology. Patient had lost about 40-50 pounds in the last 5-6 yrs, although her weight is stable with slight fluctuation in the last two years. Discussed with the attending regarding further management and plans.  Plans: CBC, CMP, TSH, H.pylori studies. Nutrition consult to help with calorie rich and protein rich nutrition recommendations. Speech evaluation to study swallow evaluation. Upper GI Barium series to look for esophageal pathology. Follow up in 2 weeks. Consider CT abdomen and pelvis to R/O any malignancies. Consider GI follow up. Recommended to drink Ensure three times daily.

## 2014-02-20 NOTE — Progress Notes (Signed)
Subjective:   Patient ID: [name] female   DOB: 08/06/1960 53 y.o.   MRN: [id number]  HPI: [name] is a 53 y.o. woman with PMH significant for anxiety, depression, COPD, chronic poor appetite and weight loss of unclear etiology comes to the office with CC of "no energy".  Patient is in the office along with her friend. The history is obtained from the patient and her friend. Patient reports that she has no appetite and does not have any energy. She reports that she feels weak and can't walk . She reports occasional nausea but denies any vomiting. She reports that her nausea gets worse when she tries to eat or drink anything. She reports that she drinks boost every day. She reports that sometimes she has difficulty swallowing solid food. She denies any fever, chills. She reports diffuse body pains, epigastric abdominal pain. She denies any dysuria, urinary frequency. She denies any diarrhea, constipation.  She denies any other complaints.  Past Medical History  Diagnosis Date  . Anxiety   . Depression   . Cervical cancer     Discovered by pap smear, treated by Dr. Charlesetta Garibaldi with LEEP procedure.  Pap since that time has been normal.  . Chronic back pain      her CT of the head without contrast June 28, 2008- moderate to large right paracentral disc protrusion at C4-C5 resulting in a right C5 neuroforaminal stenosis and probably some degree of spinal stenosis. C. bilateral C6 neuroforaminal stenosis related to  degenerative hypertrophy, no acute fracture or loose pieces identifying the cervical spine, ligamentous injury is not excluded.    Marland Kitchen Headache(784.0)   . COPD (chronic obstructive pulmonary disease)   . Arthritis   . GERD (gastroesophageal reflux disease)  October 2011     Williams, esophagogram - tiny amount of gastroesophageal reflux otherwise unremarkable exam, done by Dr. Dellis Filbert  . Osteoporosis     T score L femur neck = -2.6 (02/21/12)  . Fibromyalgia   .  Hemorrhoids    Current Outpatient Prescriptions  Medication Sig Dispense Refill  . albuterol (PROAIR HFA) 108 (90 BASE) MCG/ACT inhaler Inhale 1 puff into the lungs every 4 (four) hours as needed for wheezing or shortness of breath.  8.5 g  11  . alprazolam (XANAX) 2 MG tablet Take 1 tablet (2 mg total) by mouth 3 (three) times daily as needed for anxiety.  90 tablet  5  . budesonide-formoterol (SYMBICORT) 160-4.5 MCG/ACT inhaler Inhale 2 puffs into the lungs 2 (two) times daily.  1 Inhaler  11  . buPROPion (WELLBUTRIN XL) 150 MG 24 hr tablet Take 1 tablet (150 mg total) by mouth 2 (two) times daily. For the first 3 weeks, only take once per day  60 tablet  2  . esomeprazole (NEXIUM) 40 MG capsule Take 1 capsule (40 mg total) by mouth daily.  30 capsule  11  . fentaNYL (DURAGESIC - DOSED MCG/HR) 100 MCG/HR Place 1 patch (100 mcg total) onto the skin every other day.  15 patch  0  . Lactulose 20 GM/30ML SOLN Take 20 g by mouth daily as needed (for constipation).      . megestrol (MEGACE ES) 625 MG/5ML suspension Take 5 mLs (625 mg total) by mouth daily.  150 mL  5  . oxymorphone (OPANA) 10 MG tablet Take 1 tablet (10 mg total) by mouth 4 (four) times daily as needed for pain.  120 tablet  0  . polyethylene glycol  powder (GLYCOLAX/MIRALAX) powder Take 17 g by mouth daily.  255 g  3  . tiotropium (SPIRIVA HANDIHALER) 18 MCG inhalation capsule Place 1 capsule (18 mcg total) into inhaler and inhale daily.  30 capsule  11   Current Facility-Administered Medications  Medication Dose Route Frequency Provider Last Rate Last Dose  . cyanocobalamin ((VITAMIN B-12)) injection 1,000 mcg  1,000 mcg Intramuscular Q30 days Acquanetta Chain, DO   1,000 mcg at 02/24/11 1218   Family History  Problem Relation Age of Onset  . Stroke Mother   . Diabetes Mother   . Heart disease Father   . Lung cancer Daughter    History   Social History  . Marital Status: Divorced    Spouse Name: N/A    Number of  Children: N/A  . Years of Education: N/A   Social History Main Topics  . Smoking status: Current Every Day Smoker -- 1.00 packs/day for 34 years    Types: Cigarettes  . Smokeless tobacco: Never Used     Comment: hx drugs non since 95/ TRYING TO QUITS/ HAS CUT BACK  . Alcohol Use: No  . Drug Use: No  . Sexual Activity: None   Other Topics Concern  . None   Social History Narrative   10th grade education. Married then divorced mid 77's. One daughter. Crack cocaine about 1995 ish. One Mollie [place] admit 2001 for SI. Deemed disabled 04/01/2004 By [facility] due to documented health and mental issues.   Review of Systems: Pertinent items are noted in HPI. Objective:  Physical Exam: Filed Vitals:   02/20/14 1344  BP: 110/75  Pulse: 99  Temp: 96.6 F (35.9 C)  TempSrc: Oral  Height: 5\' 7"  (1.702 m)  Weight: 95 lb 4.8 oz (43.228 kg)  SpO2: 96%   Constitutional: Vital signs reviewed.  Patient is a cachectic appearing, lethargic, poorly nourished, appears much older than her stated age and does not appear to be in acute distress and cooperative with exam.  Head: Normocephalic and atraumatic Ear: TM normal bilaterally. Mild cerumen noted bilaterally. Nose: No erythema or drainage noted.  Turbinates normal Mouth: Dry mucous membranes. Eyes: Normal appearing conjunctiva. Pupils slightly constricted and very sluggish in reaction to the light.  Neck: Supple. No palpable thyromegaly. No palpable LN's.  Cardiovascular: RRR, S1 normal, S2 normal, no MRG Pulmonary/Chest: normal respiratory effort, CTAB, no wheezes, rales, or rhonchi Abdominal: Soft, non-distended, bowel sounds are normal, no masses, organomegaly, or guarding present. Mild TTP over the epigastrium. GU: no CVA tenderness Neurological: A&O x3. Grossly non-focal neuro exam.  Skin: Warm, dry and intact.  Psychiatric: Normal mood and affect.   Assessment & Plan:

## 2014-02-20 NOTE — Assessment & Plan Note (Signed)
I strongly suspect that the patient is overly medicated in terms of her pain medications and BZD's. She is currently on Oxymorphone 10 mg QID (equivalent of 120 mg of PO Morphine /day), Fentanyl 100 mcg/hr (equivalent of 240 mg of PO Morphine /day) = total of 360 mg of oral morphine per a day. Patient appears cachectic, lethargic and appears older than the stated age. Patient reports that she does not have energy to do any activities and lies on the couch most of the day. But with her chronic dependence to such high levels of narcotics, it will be a difficult task to taper down her narcotics. Patient would benefit from regular follow ups with PCP with a good plan about tapering the opiates.

## 2014-02-21 ENCOUNTER — Emergency Department (HOSPITAL_COMMUNITY)
Admission: EM | Admit: 2014-02-21 | Discharge: 2014-02-22 | Disposition: A | Discharge status: Home / Self Care | Payer: PRIVATE HEALTH INSURANCE | Attending: Dermatology | Admitting: Dermatology

## 2014-02-21 ENCOUNTER — Other Ambulatory Visit: Payer: Self-pay

## 2014-02-21 ENCOUNTER — Emergency Department (HOSPITAL_COMMUNITY): Payer: PRIVATE HEALTH INSURANCE

## 2014-02-21 ENCOUNTER — Encounter (HOSPITAL_COMMUNITY): Payer: Self-pay | Admitting: Emergency Medicine

## 2014-02-21 DIAGNOSIS — F3289 Other specified depressive episodes: Secondary | ICD-10-CM | POA: Insufficient documentation

## 2014-02-21 DIAGNOSIS — J449 Chronic obstructive pulmonary disease, unspecified: Secondary | ICD-10-CM | POA: Diagnosis not present

## 2014-02-21 DIAGNOSIS — F17200 Nicotine dependence, unspecified, uncomplicated: Secondary | ICD-10-CM | POA: Diagnosis not present

## 2014-02-21 DIAGNOSIS — Z8679 Personal history of other diseases of the circulatory system: Secondary | ICD-10-CM | POA: Diagnosis not present

## 2014-02-21 DIAGNOSIS — F329 Major depressive disorder, single episode, unspecified: Secondary | ICD-10-CM | POA: Diagnosis not present

## 2014-02-21 DIAGNOSIS — IMO0002 Reserved for concepts with insufficient information to code with codable children: Secondary | ICD-10-CM | POA: Diagnosis not present

## 2014-02-21 DIAGNOSIS — K5900 Constipation, unspecified: Secondary | ICD-10-CM | POA: Diagnosis not present

## 2014-02-21 DIAGNOSIS — Z8541 Personal history of malignant neoplasm of cervix uteri: Secondary | ICD-10-CM | POA: Insufficient documentation

## 2014-02-21 DIAGNOSIS — G8929 Other chronic pain: Secondary | ICD-10-CM | POA: Insufficient documentation

## 2014-02-21 DIAGNOSIS — R110 Nausea: Secondary | ICD-10-CM | POA: Diagnosis not present

## 2014-02-21 DIAGNOSIS — K219 Gastro-esophageal reflux disease without esophagitis: Secondary | ICD-10-CM | POA: Insufficient documentation

## 2014-02-21 DIAGNOSIS — F411 Generalized anxiety disorder: Secondary | ICD-10-CM | POA: Insufficient documentation

## 2014-02-21 DIAGNOSIS — Z8739 Personal history of other diseases of the musculoskeletal system and connective tissue: Secondary | ICD-10-CM | POA: Diagnosis not present

## 2014-02-21 DIAGNOSIS — R059 Cough, unspecified: Secondary | ICD-10-CM | POA: Diagnosis not present

## 2014-02-21 DIAGNOSIS — Z79899 Other long term (current) drug therapy: Secondary | ICD-10-CM | POA: Diagnosis not present

## 2014-02-21 DIAGNOSIS — R05 Cough: Secondary | ICD-10-CM | POA: Insufficient documentation

## 2014-02-21 DIAGNOSIS — N390 Urinary tract infection, site not specified: Secondary | ICD-10-CM

## 2014-02-21 DIAGNOSIS — R109 Unspecified abdominal pain: Secondary | ICD-10-CM | POA: Insufficient documentation

## 2014-02-21 DIAGNOSIS — J4489 Other specified chronic obstructive pulmonary disease: Secondary | ICD-10-CM | POA: Insufficient documentation

## 2014-02-21 LAB — URINALYSIS, ROUTINE W REFLEX MICROSCOPIC
Glucose, UA: NEGATIVE mg/dL
HGB URINE DIPSTICK: NEGATIVE
KETONES UR: NEGATIVE mg/dL
NITRITE: NEGATIVE
PH: 6 (ref 5.0–8.0)
Protein, ur: 30 mg/dL — AB
Specific Gravity, Urine: 1.025 (ref 1.005–1.030)
Urobilinogen, UA: 1 mg/dL (ref 0.0–1.0)

## 2014-02-21 LAB — CBC
HCT: 39 % (ref 36.0–46.0)
HEMOGLOBIN: 13.2 g/dL (ref 12.0–15.0)
MCH: 32.2 pg (ref 26.0–34.0)
MCHC: 33.8 g/dL (ref 30.0–36.0)
MCV: 95.1 fL (ref 78.0–100.0)
PLATELETS: 302 10*3/uL (ref 150–400)
RBC: 4.1 MIL/uL (ref 3.87–5.11)
RDW: 13.3 % (ref 11.5–15.5)
WBC: 6.2 10*3/uL (ref 4.0–10.5)

## 2014-02-21 LAB — RAPID URINE DRUG SCREEN, HOSP PERFORMED
Amphetamines: NOT DETECTED
BENZODIAZEPINES: POSITIVE — AB
Barbiturates: NOT DETECTED
COCAINE: NOT DETECTED
Opiates: NOT DETECTED
TETRAHYDROCANNABINOL: POSITIVE — AB

## 2014-02-21 LAB — COMPREHENSIVE METABOLIC PANEL
ALBUMIN: 3.6 g/dL (ref 3.5–5.2)
ALK PHOS: 97 U/L (ref 39–117)
ALT: 7 U/L (ref 0–35)
AST: 17 U/L (ref 0–37)
Anion gap: 11 (ref 5–15)
BUN: 17 mg/dL (ref 6–23)
CALCIUM: 9 mg/dL (ref 8.4–10.5)
CO2: 29 mEq/L (ref 19–32)
Chloride: 96 mEq/L (ref 96–112)
Creatinine, Ser: 0.65 mg/dL (ref 0.50–1.10)
GFR calc non Af Amer: >90 mL/min (ref >90)
GLUCOSE: 92 mg/dL (ref 70–99)
POTASSIUM: 4.4 meq/L (ref 3.7–5.3)
SODIUM: 136 meq/L — AB (ref 137–147)
TOTAL PROTEIN: 7.4 g/dL (ref 6.0–8.3)
Total Bilirubin: <0.2 mg/dL — ABNORMAL LOW (ref 0.3–1.2)

## 2014-02-21 LAB — URINE MICROSCOPIC-ADD ON

## 2014-02-21 LAB — LIPASE, BLOOD: LIPASE: 12 U/L (ref 11–59)

## 2014-02-21 LAB — TROPONIN I

## 2014-02-21 MED ORDER — SODIUM CHLORIDE 0.9 % IV SOLN
INTRAVENOUS | Status: DC
  Administered 2014-02-21: 21:00:00 via INTRAVENOUS

## 2014-02-21 MED ORDER — PANTOPRAZOLE SODIUM 40 MG IV SOLR
40.0000 mg | Freq: Once | INTRAVENOUS | Status: AC
  Administered 2014-02-21: 40 mg via INTRAVENOUS
  Filled 2014-02-21: qty 40

## 2014-02-21 NOTE — ED Notes (Signed)
Patient transported to X-ray 

## 2014-02-21 NOTE — ED Notes (Signed)
Pt. reports generalized abdominal with emesis x2 onset this evening , denies diarrhea , no fever or chills.

## 2014-02-21 NOTE — Progress Notes (Signed)
I saw patient and discussed her care with resident Dr. Eyvonne Mechanic at the time of the visit.  We reviewed the resident's history and exam and pertinent patient test results.  I agree with the assessment, diagnosis, and plan of care documented in the resident's note, with the following additional comments.  The cause of the patient's difficulty swallowing and weight loss is not clear; agree with plans for esophagram and upper GI series as well as referral for swallowing evaluation, with further workup including followup with gastroenterology as indicated.  Patient is on a chronic stable regimen of opioids and benzodiazepines, and is alert, oriented, without slurred speech or confusion; tapering her regimen is a reasonable goal, but will require close followup.  Referral to a pain management specialist would be helpful, but she has apparently been discharged from a pain clinic in the past so this may not be feasible.

## 2014-02-21 NOTE — ED Provider Notes (Signed)
CSN: [id number]     Arrival date & time 02/21/14  2102 History   First MD Initiated Contact with Patient 02/21/14 2105     Chief Complaint  Patient presents with  . Abdominal Pain     (Consider location/radiation/quality/duration/timing/severity/associated sxs/prior Treatment) Patient is a 53 y.o. female presenting with abdominal pain. The history is provided by the patient.  Abdominal Pain Associated symptoms: cough and nausea   Associated symptoms: no chest pain, no chills, no dysuria, no fever, no shortness of breath and no sore throat   pt with hx chronic back pain, chronic pain med use, c/o mid to upper abdominal pain, nausea, for the past week.  Note made of unspecified wt loss in the past several yrs, although recent pcp note indicates weight relatively stable over past 2 years.  Pt notes recurrent, persistent nausea. No vomiting or diarrhea. No abd distension. Denies fever/chills. ?hx gerd. Denies hx pancreatitis, or pud. No hx gallstones.  w above pain, no specific exacerbating or alleviating factors. Pt poor/difficult historian.      Past Medical History  Diagnosis Date  . Anxiety   . Depression   . Cervical cancer     Discovered by pap smear, treated by Dr. Charlesetta Garibaldi with LEEP procedure.  Pap since that time has been normal.  . Chronic back pain      her CT of the head without contrast June 28, 2008- moderate to large right paracentral disc protrusion at C4-C5 resulting in a right C5 neuroforaminal stenosis and probably some degree of spinal stenosis. C. bilateral C6 neuroforaminal stenosis related to  degenerative hypertrophy, no acute fracture or loose pieces identifying the cervical spine, ligamentous injury is not excluded.    Marland Kitchen Headache(784.0)   . COPD (chronic obstructive pulmonary disease)   . Arthritis   . GERD (gastroesophageal reflux disease)  October 2011     Williams, esophagogram - tiny amount of gastroesophageal reflux otherwise unremarkable exam, done by Dr.  Dellis Filbert  . Osteoporosis     T score L femur neck = -2.6 (02/21/12)  . Fibromyalgia   . Hemorrhoids    Past Surgical History  Procedure Laterality Date  . Cervical cone biopsy      leep procedure  . Multiple extractions with alveoloplasty  06/18/2012    Procedure: MULTIPLE EXTRACION WITH ALVEOLOPLASTY;  Surgeon: Gae Bon, DDS;  Location: [place] [facility];  Service: Oral Surgery;  Laterality: Bilateral;   Family History  Problem Relation Age of Onset  . Stroke Mother   . Diabetes Mother   . Heart disease Father   . Lung cancer Daughter    History  Substance Use Topics  . Smoking status: Current Every Day Smoker -- 1.00 packs/day for 34 years    Types: Cigarettes  . Smokeless tobacco: Never Used     Comment: hx drugs non since 89/ TRYING TO QUITS/ HAS CUT BACK  . Alcohol Use: No   OB History   Grav Para Term Preterm Abortions TAB SAB Ect Mult Living                 Review of Systems  Constitutional: Negative for fever and chills.  HENT: Negative for sore throat.   Eyes: Negative for redness.  Respiratory: Positive for cough. Negative for shortness of breath.   Cardiovascular: Negative for chest pain.  Gastrointestinal: Positive for nausea and abdominal pain. Negative for blood in stool.  Genitourinary: Negative for dysuria and flank pain.  Musculoskeletal: Negative for neck pain.  Skin:  Negative for rash.  Neurological: Negative for weakness, numbness and headaches.  Hematological: Does not bruise/bleed easily.  Psychiatric/Behavioral: Negative for confusion.      Allergies  Aripiprazole; Chantix; Cymbalta; Pregabalin; and Zolpidem tartrate  Home Medications   Prior to Admission medications   Medication Sig Start Date End Date Taking? Authorizing Provider  albuterol (PROAIR HFA) 108 (90 BASE) MCG/ACT inhaler Inhale 1 puff into the lungs every 4 (four) hours as needed for wheezing or shortness of breath. 01/08/14  Yes Hester Mates, MD  alprazolam Duanne Moron) 2 MG tablet  Take 1 tablet (2 mg total) by mouth 3 (three) times daily as needed for anxiety.   Yes Hester Mates, MD  budesonide-formoterol [facility]) 160-4.5 MCG/ACT inhaler Inhale 2 puffs into the lungs 2 (two) times daily. 01/08/14  Yes Hester Mates, MD  esomeprazole (NEXIUM) 40 MG capsule Take 1 capsule (40 mg total) by mouth daily. 01/08/14  Yes Hester Mates, MD  fentaNYL (DURAGESIC - DOSED MCG/HR) 100 MCG/HR Place 1 patch (100 mcg total) onto the skin every other day. 01/08/14 04/25/14 Yes Hester Mates, MD  Lactulose 20 GM/30ML SOLN Take 20 g by mouth daily as needed (for constipation).   Yes Historical Provider, MD  megestrol (MEGACE ES) 625 MG/5ML suspension Take 625 mg by mouth daily as needed (appitite).   Yes Historical Provider, MD  oxymorphone (OPANA) 10 MG tablet Take 1 tablet (10 mg total) by mouth 4 (four) times daily as needed for pain. 01/08/14  Yes Hester Mates, MD  polyethylene glycol [facility] / GLYCOLAX) packet Take 17 g by mouth daily as needed for mild constipation.   Yes Historical Provider, MD   BP 113/82  Pulse 80  Temp(Src) 98.3 F (36.8 C) (Oral)  Resp 14  SpO2 99% Physical Exam  Nursing note and vitals reviewed. Constitutional: She is oriented to person, place, and time. She appears well-developed and well-nourished. No distress.  HENT:  Mouth/Throat: Oropharynx is clear and moist.  Eyes: Conjunctivae are normal. No scleral icterus.  Neck: Neck supple. No tracheal deviation present.  Cardiovascular: Normal rate, regular rhythm, normal heart sounds and intact distal pulses.   Pulmonary/Chest: Effort normal and breath sounds normal. No respiratory distress.  Abdominal: Soft. Normal appearance and bowel sounds are normal. She exhibits no distension and no mass. There is tenderness. There is no rebound and no guarding.  Mid and upper abd tenderness, no rebound or guarding. No incarc hernia.   Genitourinary:  No cva tenderness  Musculoskeletal: She exhibits no edema and no  tenderness.  Neurological: She is alert and oriented to person, place, and time.  Skin: Skin is warm and dry. No rash noted. She is not diaphoretic.  Psychiatric:  Appears medicated/sl drowsy.     ED Course  Procedures (including critical care time) Labs Review   Results for orders placed during the hospital encounter of 02/21/14  TROPONIN I      Result Value Ref Range   Troponin I <0.30  <0.30 ng/mL  URINALYSIS, ROUTINE W REFLEX MICROSCOPIC      Result Value Ref Range   Color, Urine AMBER (*) YELLOW   APPearance CLOUDY (*) CLEAR   Specific Gravity, Urine 1.025  1.005 - 1.030   pH 6.0  5.0 - 8.0   Glucose, UA NEGATIVE  NEGATIVE mg/dL   Hgb urine dipstick NEGATIVE  NEGATIVE   Bilirubin Urine SMALL (*) NEGATIVE   Ketones, ur NEGATIVE  NEGATIVE mg/dL   Protein, ur 30 (*)  NEGATIVE mg/dL   Urobilinogen, UA 1.0  0.0 - 1.0 mg/dL   Nitrite NEGATIVE  NEGATIVE   Leukocytes, UA MODERATE (*) NEGATIVE  URINE RAPID DRUG SCREEN (HOSP PERFORMED)      Result Value Ref Range   Opiates NONE DETECTED  NONE DETECTED   Cocaine NONE DETECTED  NONE DETECTED   Benzodiazepines POSITIVE (*) NONE DETECTED   Amphetamines NONE DETECTED  NONE DETECTED   Tetrahydrocannabinol POSITIVE (*) NONE DETECTED   Barbiturates NONE DETECTED  NONE DETECTED  LIPASE, BLOOD      Result Value Ref Range   Lipase 12  11 - 59 U/L  CBC      Result Value Ref Range   WBC 6.2  4.0 - 10.5 K/uL   RBC 4.10  3.87 - 5.11 MIL/uL   Hemoglobin 13.2  12.0 - 15.0 g/dL   HCT 39.0  36.0 - 46.0 %   MCV 95.1  78.0 - 100.0 fL   MCH 32.2  26.0 - 34.0 pg   MCHC 33.8  30.0 - 36.0 g/dL   RDW 13.3  11.5 - 15.5 %   Platelets 302  150 - 400 K/uL  COMPREHENSIVE METABOLIC PANEL      Result Value Ref Range   Sodium 136 (*) 137 - 147 mEq/L   Potassium 4.4  3.7 - 5.3 mEq/L   Chloride 96  96 - 112 mEq/L   CO2 29  19 - 32 mEq/L   Glucose, Bld 92  70 - 99 mg/dL   BUN 17  6 - 23 mg/dL   Creatinine, Ser 0.65  0.50 - 1.10 mg/dL   Calcium 9.0   8.4 - 10.5 mg/dL   Total Protein 7.4  6.0 - 8.3 g/dL   Albumin 3.6  3.5 - 5.2 g/dL   AST 17  0 - 37 U/L   ALT 7  0 - 35 U/L   Alkaline Phosphatase 97  39 - 117 U/L   Total Bilirubin <0.2 (*) 0.3 - 1.2 mg/dL   GFR calc non Af Amer >90  >90 mL/min   GFR calc Af Amer >90  >90 mL/min   Anion gap 11  5 - 15  URINE MICROSCOPIC-ADD ON      Result Value Ref Range   Squamous Epithelial / LPF RARE  RARE   WBC, UA 7-10  <3 WBC/hpf   Bacteria, UA MANY (*) RARE   Urine-Other MUCOUS PRESENT     Dg Chest 2 View  02/21/2014   CLINICAL DATA:  Substernal chest pain.  EXAM: CHEST  2 VIEW  COMPARISON:  09/13/2013.  04/29/2010.  FINDINGS: Asymmetric opacity in the right apex is stable since 2011. The lungs are clear without focal infiltrate, edema, pneumothorax or pleural effusion. The cardiopericardial silhouette is within normal limits for size. Telemetry leads overlie the chest.  IMPRESSION: Stable.  No acute findings.   Electronically Signed   By: Misty Stanley M.D.   On: 02/21/2014 22:43      MDM  Iv ns. Labs. Xr.  protonix iv.  Reviewed nursing notes and prior charts for additional history.   Reviewed prior charts, pt w recent pcp visit, note made of complex situation esp given significant chronic pain hx, and v high doses chronic pain medication.  pcp had planned on imaging/ct if pain persisted.   abd tenderness - will get ct.  ua w cloudy urine, moderate LE, 7-10 wbc, will culture and rx. Dose abx given in ed.   0012, ct pending -  signed out to Dr Dina Rich to check CT when resulted - if CT neg acute, feel that likely pt will be able to be discharged w rx abx for possible uti pending urine culture result.     Mirna Mires, MD 02/22/14 [phone]

## 2014-02-22 ENCOUNTER — Encounter (HOSPITAL_COMMUNITY): Payer: Self-pay

## 2014-02-22 ENCOUNTER — Emergency Department (HOSPITAL_COMMUNITY): Payer: PRIVATE HEALTH INSURANCE

## 2014-02-22 DIAGNOSIS — N390 Urinary tract infection, site not specified: Secondary | ICD-10-CM | POA: Diagnosis not present

## 2014-02-22 MED ORDER — CEPHALEXIN 500 MG PO CAPS: 500.0000 mg | ORAL_CAPSULE | Freq: Two times a day (BID) | ORAL | Status: DC

## 2014-02-22 MED ORDER — KETOROLAC TROMETHAMINE 30 MG/ML IJ SOLN
30.0000 mg | Freq: Once | INTRAMUSCULAR | Status: AC
  Administered 2014-02-22: 30 mg via INTRAVENOUS
  Filled 2014-02-22: qty 1

## 2014-02-22 MED ORDER — DEXTROSE 5 % IV SOLN
1.0000 g | Freq: Once | INTRAVENOUS | Status: AC
  Administered 2014-02-22: 1 g via INTRAVENOUS
  Filled 2014-02-22: qty 10

## 2014-02-22 MED ORDER — IOHEXOL 300 MG/ML  SOLN
100.0000 mL | Freq: Once | INTRAMUSCULAR | Status: AC | PRN
  Administered 2014-02-22: 100 mL via INTRAVENOUS

## 2014-02-22 NOTE — Discharge Instructions (Signed)
Constipation °Constipation is when a person has fewer than three bowel movements a week, has difficulty having a bowel movement, or has stools that are dry, hard, or larger than normal. As people grow older, constipation is more common. If you try to fix constipation with medicines that make you have a bowel movement (laxatives), the problem may get worse. Long-term laxative use may cause the muscles of the colon to become weak. A low-fiber diet, not taking in enough fluids, and taking certain medicines may make constipation worse.  °CAUSES  °· Certain medicines, such as antidepressants, pain medicine, iron supplements, antacids, and water pills.   °· Certain diseases, such as diabetes, irritable bowel syndrome (IBS), thyroid disease, or depression.   °· Not drinking enough water.   °· Not eating enough fiber-rich foods.   °· Stress or travel.   °· Lack of physical activity or exercise.   °· Ignoring the urge to have a bowel movement.   °· Using laxatives too much.   °SIGNS AND SYMPTOMS  °· Having fewer than three bowel movements a week.   °· Straining to have a bowel movement.   °· Having stools that are hard, dry, or larger than normal.   °· Feeling full or bloated.   °· Pain in the lower abdomen.   °· Not feeling relief after having a bowel movement.   °DIAGNOSIS  °Your health care provider will take a medical history and perform a physical exam. Further testing may be done for severe constipation. Some tests may include: °· A barium enema X-ray to examine your rectum, colon, and, sometimes, your small intestine.   °· A sigmoidoscopy to examine your lower colon.   °· A colonoscopy to examine your entire colon. °TREATMENT  °Treatment will depend on the severity of your constipation and what is causing it. Some dietary treatments include drinking more fluids and eating more fiber-rich foods. Lifestyle treatments may include regular exercise. If these diet and lifestyle recommendations do not help, your health care  provider may recommend taking over-the-counter laxative medicines to help you have bowel movements. Prescription medicines may be prescribed if over-the-counter medicines do not work.  °HOME CARE INSTRUCTIONS  °· Eat foods that have a lot of fiber, such as fruits, vegetables, whole grains, and beans. °· Limit foods high in fat and processed sugars, such as french fries, hamburgers, cookies, candies, and soda.   °· A fiber supplement may be added to your diet if you cannot get enough fiber from foods.   °· Drink enough fluids to keep your urine clear or pale yellow.   °· Exercise regularly or as directed by your health care provider.   °· Go to the restroom when you have the urge to go. Do not hold it.   °· Only take over-the-counter or prescription medicines as directed by your health care provider. Do not take other medicines for constipation without talking to your health care provider first.   °SEEK IMMEDIATE MEDICAL CARE IF:  °· You have bright red blood in your stool.   °· Your constipation lasts for more than 4 days or gets worse.   °· You have abdominal or rectal pain.   °· You have thin, pencil-like stools.   °· You have unexplained weight loss. °MAKE SURE YOU:  °· Understand these instructions. °· Will watch your condition. °· Will get help right away if you are not doing well or get worse. °Document Released: 04/15/2004 Document Revised: 07/23/2013 Document Reviewed: 04/29/2013 °ExitCare® Patient Information ©2015 [employer]. This information is not intended to replace advice given to you by your health care provider. Make sure you discuss any questions   you have with your health care provider. °Urinary Tract Infection °Urinary tract infections (UTIs) can develop anywhere along your urinary tract. Your urinary tract is your body's drainage system for removing wastes and extra water. Your urinary tract includes two kidneys, two ureters, a bladder, and a urethra. Your kidneys are a pair of bean-shaped  organs. Each kidney is about the size of your fist. They are located below your ribs, one on each side of your spine. °CAUSES °Infections are caused by microbes, which are microscopic organisms, including fungi, viruses, and bacteria. These organisms are so small that they can only be seen through a microscope. Bacteria are the microbes that most commonly cause UTIs. °SYMPTOMS  °Symptoms of UTIs may vary by age and gender of the patient and by the location of the infection. Symptoms in young women typically include a frequent and intense urge to urinate and a painful, burning feeling in the bladder or urethra during urination. Older women and men are more likely to be tired, shaky, and weak and have muscle aches and abdominal pain. A fever may mean the infection is in your kidneys. Other symptoms of a kidney infection include pain in your back or sides below the ribs, nausea, and vomiting. °DIAGNOSIS °To diagnose a UTI, your caregiver will ask you about your symptoms. Your caregiver also will ask to provide a urine sample. The urine sample will be tested for bacteria and white blood cells. White blood cells are made by your body to help fight infection. °TREATMENT  °Typically, UTIs can be treated with medication. Because most UTIs are caused by a bacterial infection, they usually can be treated with the use of antibiotics. The choice of antibiotic and length of treatment depend on your symptoms and the type of bacteria causing your infection. °HOME CARE INSTRUCTIONS °· If you were prescribed antibiotics, take them exactly as your caregiver instructs you. Finish the medication even if you feel better after you have only taken some of the medication. °· Drink enough water and fluids to keep your urine clear or pale yellow. °· Avoid caffeine, tea, and carbonated beverages. They tend to irritate your bladder. °· Empty your bladder often. Avoid holding urine for long periods of time. °· Empty your bladder before and  after sexual intercourse. °· After a bowel movement, women should cleanse from front to back. Use each tissue only once. °SEEK MEDICAL CARE IF:  °· You have back pain. °· You develop a fever. °· Your symptoms do not begin to resolve within 3 days. °SEEK IMMEDIATE MEDICAL CARE IF:  °· You have severe back pain or lower abdominal pain. °· You develop chills. °· You have nausea or vomiting. °· You have continued burning or discomfort with urination. °MAKE SURE YOU:  °· Understand these instructions. °· Will watch your condition. °· Will get help right away if you are not doing well or get worse. °Document Released: 04/27/2005 Document Revised: 01/17/2012 Document Reviewed: 08/26/2011 °ExitCare® Patient Information ©2015 [employer]. This information is not intended to replace advice given to you by your health care provider. Make sure you discuss any questions you have with your health care provider. ° °

## 2014-02-22 NOTE — ED Notes (Signed)
Patient took about 50cc of water refused to take any more no n/v noted

## 2014-02-22 NOTE — ED Provider Notes (Signed)
Signed out from Dr. Ashok Cordia pending CT.  Results for orders placed during the hospital encounter of 02/21/14  TROPONIN I      Result Value Ref Range   Troponin I <0.30  <0.30 ng/mL  URINALYSIS, ROUTINE W REFLEX MICROSCOPIC      Result Value Ref Range   Color, Urine AMBER (*) YELLOW   APPearance CLOUDY (*) CLEAR   Specific Gravity, Urine 1.025  1.005 - 1.030   pH 6.0  5.0 - 8.0   Glucose, UA NEGATIVE  NEGATIVE mg/dL   Hgb urine dipstick NEGATIVE  NEGATIVE   Bilirubin Urine SMALL (*) NEGATIVE   Ketones, ur NEGATIVE  NEGATIVE mg/dL   Protein, ur 30 (*) NEGATIVE mg/dL   Urobilinogen, UA 1.0  0.0 - 1.0 mg/dL   Nitrite NEGATIVE  NEGATIVE   Leukocytes, UA MODERATE (*) NEGATIVE  URINE RAPID DRUG SCREEN (HOSP PERFORMED)      Result Value Ref Range   Opiates NONE DETECTED  NONE DETECTED   Cocaine NONE DETECTED  NONE DETECTED   Benzodiazepines POSITIVE (*) NONE DETECTED   Amphetamines NONE DETECTED  NONE DETECTED   Tetrahydrocannabinol POSITIVE (*) NONE DETECTED   Barbiturates NONE DETECTED  NONE DETECTED  LIPASE, BLOOD      Result Value Ref Range   Lipase 12  11 - 59 U/L  CBC      Result Value Ref Range   WBC 6.2  4.0 - 10.5 K/uL   RBC 4.10  3.87 - 5.11 MIL/uL   Hemoglobin 13.2  12.0 - 15.0 g/dL   HCT 39.0  36.0 - 46.0 %   MCV 95.1  78.0 - 100.0 fL   MCH 32.2  26.0 - 34.0 pg   MCHC 33.8  30.0 - 36.0 g/dL   RDW 13.3  11.5 - 15.5 %   Platelets 302  150 - 400 K/uL  COMPREHENSIVE METABOLIC PANEL      Result Value Ref Range   Sodium 136 (*) 137 - 147 mEq/L   Potassium 4.4  3.7 - 5.3 mEq/L   Chloride 96  96 - 112 mEq/L   CO2 29  19 - 32 mEq/L   Glucose, Bld 92  70 - 99 mg/dL   BUN 17  6 - 23 mg/dL   Creatinine, Ser 0.65  0.50 - 1.10 mg/dL   Calcium 9.0  8.4 - 10.5 mg/dL   Total Protein 7.4  6.0 - 8.3 g/dL   Albumin 3.6  3.5 - 5.2 g/dL   AST 17  0 - 37 U/L   ALT 7  0 - 35 U/L   Alkaline Phosphatase 97  39 - 117 U/L   Total Bilirubin <0.2 (*) 0.3 - 1.2 mg/dL   GFR calc non Af  Amer >90  >90 mL/min   GFR calc Af Amer >90  >90 mL/min   Anion gap 11  5 - 15  URINE MICROSCOPIC-ADD ON      Result Value Ref Range   Squamous Epithelial / LPF RARE  RARE   WBC, UA 7-10  <3 WBC/hpf   Bacteria, UA MANY (*) RARE   Urine-Other MUCOUS PRESENT     Dg Chest 2 View  02/21/2014   CLINICAL DATA:  Substernal chest pain.  EXAM: CHEST  2 VIEW  COMPARISON:  09/13/2013.  04/29/2010.  FINDINGS: Asymmetric opacity in the right apex is stable since 2011. The lungs are clear without focal infiltrate, edema, pneumothorax or pleural effusion. The cardiopericardial silhouette is within normal limits for  size. Telemetry leads overlie the chest.  IMPRESSION: Stable.  No acute findings.   Electronically Signed   By: Misty Stanley M.D.   On: 02/21/2014 22:43   Ct Abdomen Pelvis W Contrast  02/22/2014   CLINICAL DATA:  Mid to upper abdominal pain.  Nausea.  EXAM: CT ABDOMEN AND PELVIS WITH CONTRAST  TECHNIQUE: Multidetector CT imaging of the abdomen and pelvis was performed using the standard protocol following bolus administration of intravenous contrast.  CONTRAST:  163mL OMNIPAQUE IOHEXOL 300 MG/ML  SOLN  COMPARISON:  01/18/2008.  FINDINGS: Lung Bases: Unremarkable  Liver:  Calcified granuloma the left liver.  Otherwise unremarkable.  Spleen: No splenomegaly. No focal mass lesion.  Stomach: Nondistended. No gastric wall thickening. No evidence of outlet obstruction.  Pancreas: No focal mass lesion. No dilatation of the main duct. No intraparenchymal cyst. No peripancreatic edema.  Gallbladder/Biliary: No evidence for gallstones. No pericholecystic fluid. No intrahepatic or extrahepatic biliary dilation.  Kidneys/Adrenals: No adrenal nodule. Kidneys are normal in appearance.  Bowel Loops: Duodenum is normally positioned as is the ligament of Treitz. No evidence for small bowel obstruction. The terminal ileum not well seen. Appendix not clearly visualized. Large stool volume throughout colon, specially in the  rectosigmoid segment.  Nodes: No pelvic sidewall lymphadenopathy. 6 mm short axis left para-aortic lymph node on image 33 has increased slightly in size in the interval from 4 mm on the previous study from 6 years ago. 7 mm short axis aortocaval lymph node is seen on image 25.  Vasculature: Atherosclerotic calcification is noted in the wall of the abdominal aorta without aneurysm.  Pelvic Genitourinary: Uterus is unremarkable. There is no adnexal mass.  Bones/Musculoskeletal: Bone windows reveal no worrisome lytic or sclerotic osseous lesions.  Body Wall: No evidence for abdominal wall hernia.  Other: No intraperitoneal free fluid.  IMPRESSION: Prominent stool volume raises the question of constipation although it is not diagnostic of such.  Borderline retroperitoneal lymphadenopathy in the abdomen. Given the patient's reported history of cervical cancer, close followup recommended.   Electronically Signed   By: Misty Stanley M.D.   On: 02/22/2014 02:24      Merryl Hacker, MD 02/22/14 [phone]

## 2014-02-22 NOTE — ED Notes (Signed)
Patient transported to MRI 

## 2014-02-23 LAB — URINE CULTURE: Colony Count: 45000

## 2014-02-25 ENCOUNTER — Other Ambulatory Visit: Payer: PRIVATE HEALTH INSURANCE

## 2014-02-26 LAB — HELICOBACTER PYLORI  SPECIAL ANTIGEN: H. PYLORI ANTIGEN STOOL: NEGATIVE

## 2014-02-28 ENCOUNTER — Other Ambulatory Visit (HOSPITAL_COMMUNITY): Payer: Self-pay | Admitting: Internal Medicine

## 2014-02-28 DIAGNOSIS — R1310 Dysphagia, unspecified: Secondary | ICD-10-CM

## 2014-03-04 ENCOUNTER — Ambulatory Visit (HOSPITAL_COMMUNITY)
Admission: RE | Admit: 2014-03-04 | Discharge: 2014-03-04 | Disposition: A | Discharge status: Home / Self Care | Payer: PRIVATE HEALTH INSURANCE | Source: Ambulatory Visit | Attending: Internal Medicine | Admitting: Internal Medicine

## 2014-03-04 DIAGNOSIS — R1310 Dysphagia, unspecified: Secondary | ICD-10-CM | POA: Insufficient documentation

## 2014-03-04 DIAGNOSIS — R1314 Dysphagia, pharyngoesophageal phase: Secondary | ICD-10-CM | POA: Diagnosis not present

## 2014-03-04 NOTE — Procedures (Signed)
Objective Swallowing Evaluation: Modified Barium Swallowing Study  Patient Details  Name: [name] MRN: [id number] Date of Birth: 10-30-1960  Today's Date: 03/04/2014 Time: 1120-1145 SLP Time Calculation (min): 25 min  Past Medical History:  Past Medical History  Diagnosis Date  . Anxiety   . Depression   . Cervical cancer     Discovered by pap smear, treated by Dr. Charlesetta Garibaldi with LEEP procedure.  Pap since that time has been normal.  . Chronic back pain      her CT of the head without contrast June 28, 2008- moderate to large right paracentral disc protrusion at C4-C5 resulting in a right C5 neuroforaminal stenosis and probably some degree of spinal stenosis. C. bilateral C6 neuroforaminal stenosis related to  degenerative hypertrophy, no acute fracture or loose pieces identifying the cervical spine, ligamentous injury is not excluded.    Marland Kitchen Headache(784.0)   . COPD (chronic obstructive pulmonary disease)   . Arthritis   . GERD (gastroesophageal reflux disease)  October 2011     Williams, esophagogram - tiny amount of gastroesophageal reflux otherwise unremarkable exam, done by Dr. Dellis Filbert  . Osteoporosis     T score L femur neck = -2.6 (02/21/12)  . Fibromyalgia   . Hemorrhoids    Past Surgical History:  Past Surgical History  Procedure Laterality Date  . Cervical cone biopsy      leep procedure  . Multiple extractions with alveoloplasty  06/18/2012    Procedure: MULTIPLE EXTRACION WITH ALVEOLOPLASTY;  Surgeon: Gae Bon, DDS;  Location: [facility];  Service: Oral Surgery;  Laterality: Bilateral;   HPI:   53 y.o. woman with PMH significant for anxiety, depression, COPD, chronic poor appetite and weight loss of unclear etiology referred for OP MBS.  2009- moderate to large right paracentral disc protrusion at C4-C5 .  Pt apathetic, monotone vocal quality, difficult to engage.      Assessment / Plan / Recommendation Clinical Impression  Dysphagia Diagnosis: Mild  pharyngeal phase dysphagia; esophageal dysphagia  Clinical impression: Pt presented with a mild pharyngeal but primarily esophageal dysphagia.  Oral phase was normal.  Swallow response was delayed, with trace aspiration of thin liquids noted on first trial.  Aspiration did not recur throughout the rest of the study.  Screening of the esophagus revealed barium stasis throughout esophageal column, particularly with more viscous materials.  Pt was poorly interactive throughout the test, keeping her eyes closed, then described her pain level as an 8/10.  Recommend further GI f/u given recent significant weight loss and results of this study.  Also recommend a nutrition consult with [facility].      Treatment Recommendation      Diet Recommendation Regular;Thin liquid   Liquid Administration via: Cup;Straw Medication Administration: Whole meds with liquid Supervision: Patient able to self feed    Other  Recommendations Recommended Consults: Consider esophageal assessment Oral Care Recommendations: Oral care BID   Follow Up Recommendations  None        General HPI:  53 y.o. woman with PMH significant for anxiety, depression, COPD, chronic poor appetite and weight loss of unclear etiology referred for OP MBS.  2009- moderate to large right paracentral disc protrusion at C4-C5  Type of Study: Modified Barium Swallowing Study Reason for Referral: Objectively evaluate swallowing function Diet Prior to this Study: Regular;Thin liquids Temperature Spikes Noted: No Respiratory Status: Room air History of Recent Intubation: No Behavior/Cognition: Alert Oral Cavity - Dentition: Edentulous Oral Motor /  Sensory Function: Within functional limits Self-Feeding Abilities: Able to feed self Patient Positioning: Upright in chair Baseline Vocal Quality: Low vocal intensity Volitional Cough: Strong Volitional Swallow: Able to elicit Anatomy:  (presence of osteophytes along  cerv column - no MD to confirm) Pharyngeal Secretions: Not observed secondary MBS    Reason for Referral Objectively evaluate swallowing function   Oral Phase Oral Preparation/Oral Phase Oral Phase: WFL   Pharyngeal Phase Pharyngeal Phase Pharyngeal Phase: Impaired Pharyngeal - Thin Pharyngeal - Thin Cup: Delayed swallow initiation;Reduced airway/laryngeal closure;Penetration/Aspiration during swallow;Trace aspiration;Pharyngeal residue - valleculae (occurred once out of multiple trials) Penetration/Aspiration details (thin cup): Material enters airway, passes BELOW cords and not ejected out despite cough attempt by patient Pharyngeal - Solids Pharyngeal - Puree: Delayed swallow initiation;Pharyngeal residue - valleculae Pharyngeal - Mechanical Soft: Delayed swallow initiation;Pharyngeal residue - valleculae  Cervical Esophageal Phase    GO    Cervical Esophageal Phase Cervical Esophageal Phase: Impaired Cervical Esophageal Phase - Comment Cervical Esophageal Comment:  (esophageal screen reveals presence of barium stasis througho)    Functional Assessment Tool Used: clinical judgement Functional Limitations: Swallowing Swallow Current Status (J1884): At least 1 percent but less than 20 percent impaired, limited or restricted Swallow Goal Status [phone]): At least 1 percent but less than 20 percent impaired, limited or restricted Swallow Discharge Status [phone]): At least 1 percent but less than 20 percent impaired, limited or restricted    [name] 03/04/2014, 3:02 PM

## 2014-03-13 ENCOUNTER — Encounter: Payer: PRIVATE HEALTH INSURANCE | Admitting: Speech Pathology

## 2014-03-18 ENCOUNTER — Ambulatory Visit: Payer: PRIVATE HEALTH INSURANCE | Attending: Internal Medicine | Admitting: Speech Pathology

## 2014-03-18 DIAGNOSIS — IMO0001 Reserved for inherently not codable concepts without codable children: Secondary | ICD-10-CM | POA: Diagnosis not present

## 2014-03-18 DIAGNOSIS — R1310 Dysphagia, unspecified: Secondary | ICD-10-CM | POA: Insufficient documentation

## 2014-03-24 ENCOUNTER — Encounter: Payer: PRIVATE HEALTH INSURANCE | Admitting: Dietician

## 2014-03-24 ENCOUNTER — Telehealth: Payer: Self-pay | Admitting: *Deleted

## 2014-03-24 NOTE — Telephone Encounter (Signed)
Encounter opened in error. Yvonna Alanis, RN, 03/24/14 7:25 PM

## 2014-03-28 ENCOUNTER — Other Ambulatory Visit: Payer: Self-pay | Admitting: Internal Medicine

## 2014-03-31 ENCOUNTER — Encounter: Payer: Self-pay | Admitting: Internal Medicine

## 2014-03-31 ENCOUNTER — Ambulatory Visit: Payer: PRIVATE HEALTH INSURANCE | Admitting: Dietician

## 2014-03-31 ENCOUNTER — Encounter: Payer: Self-pay | Admitting: Pharmacist

## 2014-03-31 ENCOUNTER — Ambulatory Visit (INDEPENDENT_AMBULATORY_CARE_PROVIDER_SITE_OTHER): Payer: PRIVATE HEALTH INSURANCE | Admitting: Internal Medicine

## 2014-03-31 ENCOUNTER — Encounter: Payer: Self-pay | Admitting: Dietician

## 2014-03-31 VITALS — Wt 95.6 lb

## 2014-03-31 VITALS — BP 111/78 | HR 84 | Temp 97.5°F | Ht 67.0 in | Wt 95.8 lb

## 2014-03-31 DIAGNOSIS — Z0000 Encounter for general adult medical examination without abnormal findings: Secondary | ICD-10-CM

## 2014-03-31 DIAGNOSIS — T40604A Poisoning by unspecified narcotics, undetermined, initial encounter: Secondary | ICD-10-CM

## 2014-03-31 DIAGNOSIS — F1920 Other psychoactive substance dependence, uncomplicated: Secondary | ICD-10-CM

## 2014-03-31 DIAGNOSIS — R636 Underweight: Secondary | ICD-10-CM

## 2014-03-31 DIAGNOSIS — Z1231 Encounter for screening mammogram for malignant neoplasm of breast: Secondary | ICD-10-CM

## 2014-03-31 DIAGNOSIS — Z720 Tobacco use: Secondary | ICD-10-CM

## 2014-03-31 DIAGNOSIS — G894 Chronic pain syndrome: Secondary | ICD-10-CM

## 2014-03-31 DIAGNOSIS — T400X1A Poisoning by opium, accidental (unintentional), initial encounter: Secondary | ICD-10-CM

## 2014-03-31 DIAGNOSIS — F411 Generalized anxiety disorder: Secondary | ICD-10-CM

## 2014-03-31 DIAGNOSIS — F1120 Opioid dependence, uncomplicated: Secondary | ICD-10-CM

## 2014-03-31 DIAGNOSIS — F419 Anxiety disorder, unspecified: Secondary | ICD-10-CM

## 2014-03-31 DIAGNOSIS — F17200 Nicotine dependence, unspecified, uncomplicated: Secondary | ICD-10-CM

## 2014-03-31 MED ORDER — OXYMORPHONE HCL 10 MG PO TABS: 10.0000 mg | ORAL_TABLET | Freq: Two times a day (BID) | ORAL | Status: DC | PRN

## 2014-03-31 MED ORDER — FENTANYL 75 MCG/HR TD PT72: 75.0000 ug | MEDICATED_PATCH | TRANSDERMAL | Status: DC

## 2014-03-31 MED ORDER — ALPRAZOLAM 1 MG PO TABS: 1.0000 mg | ORAL_TABLET | Freq: Three times a day (TID) | ORAL | Status: DC | PRN

## 2014-03-31 MED ORDER — OXYMORPHONE HCL 10 MG PO TABS: 10.0000 mg | ORAL_TABLET | Freq: Three times a day (TID) | ORAL | Status: DC | PRN

## 2014-03-31 NOTE — Patient Instructions (Addendum)
Please make a follow up appointment for 3-4 weeks.   Please try to drink 2 glasses of chocolate milk each day and Boost twice a day.  You need to have a bowel movement every other day. Please ask your doctor if you can use the powder that helps you use the bathroom every day.   Here are the tips to gain weight we discussed:    High Calorie Diet A  high calorie diet increases the amount of  calories you eat. You may need more calories in your diet because of illness, surgery, injury, weight loss, or having a poor appetite. Eating high calorie foods can help you gain weight, heal, and recover after illness.    GENERAL GUIDELINES TO INCREASE CALORIES  Replace calorie-free drinks with calorie-containing drinks, such as milk, fruit juices, regular soda, milkshakes, and hot chocolate.  Try to eat 6 small meals instead of 3 large meals each day.  Keep snacks handy, such as nuts, trail mixes, dried fruit, and yogurt.  Choose foods with sauces and gravies.  Add dried fruits, honey, and half-and-half to hot or cold cereal.  Add extra fats when possible, such as butter, sour cream, cream cheese, and salad dressings.  Add cheese to foods often.  Consider adding a clear liquid nutritional supplement to your diet. Your caregiver can give you recommendations. HIGH CALORIE FOODS Grain/Starch  Baked goods, such as muffins and quick breads.  Croissants.  Pancakes and waffles. Vegetable   Sauted vegetables in oil.  Fried vegetables.  Salad greens with regular salad dressing or vinegar and oil. Fruit  Dried fruit.  Canned fruit in syrup.  Fruit juice. Fat  Avocado.  Butter or margarine.  Whipped cream.  Mayonnaise.  Salad dressing.  Peanuts and mixed nuts.  Cream cheese and sour cream. Sweets and Dessert  Cake.  Cookies.  Pie.  Ice cream.  Doughnuts and pastries.  Protein and meal replacement bars.  Jam, preserves, and jelly.  Candy  bars.  Chocolate.  Chocolate, caramel, or other flavored syrups.

## 2014-03-31 NOTE — Patient Instructions (Addendum)
Thank you for your visit today.   Please return to the internal medicine clinic in 1 month(s) or sooner if needed.   We will discuss your medication changes at next visit.   Referral for mammogram today.     Please be sure to bring all of your medications with you to every visit; this includes herbal supplements, vitamins, eye drops, and any over-the-counter medications.   Should you have any questions regarding your medications and/or any new or worsening symptoms, please be sure to call the clinic at [phone].   If you believe that you are suffering from a life threatening condition or one that may result in the loss of limb or function, then you should call 911 or proceed to the nearest Emergency Department.

## 2014-03-31 NOTE — Progress Notes (Signed)
  Medical Nutrition Therapy:  Appt start time: 1330 end time:  1430  Assessment:  Primary concerns today: weight gain. .  Patient wants to gain weight and have more energy. Her goal weight is 130-145#. She reports that lack of appetite is her main problem to gaining weight and her weight was 150# until she had an accident on a lawn mower 5-6 years ago when she began losing weight. Dropped to 130 about 2 years ago and recently the stress of her sister's illness has affected her appetite. She says her anxiety and coping with her stress causes her lack of appetite. She has found friend to live with her for support and to help her cook as of yesterday who is with her today as well as prayer are helping her to eat better. Reports cold intolerance, dislike of eating alone and difficulty chewing with her dentures in. Does fine chewing soft foods with them out. Has ~ 1-2 bowel movements/week, does not use teeth to eat, acknowledges that side effects from medications may be impacting weight. 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   Preferred Learning Style: No preference indicated  Learning Readiness: Ready and Change in progress  MEDICATIONS: reviewed with pharmacist: xanax, opana and fentanyl concerning for their effects on appetite and bowels, megace, wellbutrin noted.   DIETARY INTAKE: Usual eating pattern includes 1-2 meals and 1-2 snacks per day. Everyday foods include .  Avoided foods include .   24-hr recall:  B ( AM): often skips or may have western omelet or sausage and gravy biscuit, coffee with cream and sugar or sweet tea Snk ( AM): regular size milky way bar L ( PM): subway cold cut sub, or treats herself to steak and cheese sub.  D ( PM): hamburger helper, spaghetti, grilled cheese and mayonnaise sandwich. salads Beverages:  3- 12 oz Glasses of sweet tea/day, boost x1/day  Usual physical  activity: walks dog 1/4 mile one a day, would walk more but gets tired out Weight 95.8#, BMI 14, deal weight range 135# (120#- 150#) severely underweight Nutrition Focused Physical Exam 1. Below Eye fat-  diminished 2. Temple muscle-  diminished 6. Hand muscle-  diminished 10. Leg/ankle edema- absent 12. Thoracic and Lumbar Fat-  diminished 13. Sacral Area edema- absent  Estimated energy needs for weight gain/day: 1600-1800 calories 50-60 g protein   Progress Towards Goal(s):  In progress.   Nutritional Diagnosis:  Jarratt-3.1 Underweight As related to lack of appetite and meeication interaction/side effects.  As evidenced by her BMI of 14.    Intervention:  Nutrition education and counseling on side effects of medications on appetite and bowels, importance of support system to achieving goals, and weight gain- high calorie diet.  Coordination of care- discussed medication/plan with doctor and pharmacist.  Teaching Method Utilized: Visual, Auditory Handouts given during visit include:  AVS and High Calorie foods Barriers to learning/adherence to lifestyle change: depression, coping, medication side effects  Demonstrated degree of understanding via:  Teach Back   Monitoring/Evaluation:  Dietary intake, exercise, and body weight in 3-4 week(s).

## 2014-03-31 NOTE — Progress Notes (Signed)
Patient ID: Lynnette Caffey, female   DOB: 06/19/1961, 53 y.o.   MRN: [id number]     Subjective:   Patient ID: BETHANEY OSHANA female    DOB: [birth year] 53 y.o.    MRN: [id number] Health Maintenance Due: Health Maintenance Due  Topic Date Due  . Tetanus/tdap  01/19/1978  . Mammogram  02/20/2014  . Influenza Vaccine  03/01/2014    _________________________________________________  HPI: Ms.Guy AIYANAH KALAMA is a 53 y.o. female here for a routine visit requesting refills of her pain meds. Pt has a PMH outlined below.  Please see problem-based charting assessment and plan note for further details of medical issues addressed at today's visit.  PMH: Past Medical History  Diagnosis Date  . Anxiety   . Depression   . Cervical cancer     Discovered by pap smear, treated by Dr. Charlesetta Garibaldi with LEEP procedure.  Pap since that time has been normal.  . Chronic back pain      her CT of the head without contrast June 28, 2008- moderate to large right paracentral disc protrusion at C4-C5 resulting in a right C5 neuroforaminal stenosis and probably some degree of spinal stenosis. C. bilateral C6 neuroforaminal stenosis related to  degenerative hypertrophy, no acute fracture or loose pieces identifying the cervical spine, ligamentous injury is not excluded.    Marland Kitchen Headache(784.0)   . COPD (chronic obstructive pulmonary disease)   . Arthritis   . GERD (gastroesophageal reflux disease)  October 2011     Williams, esophagogram - tiny amount of gastroesophageal reflux otherwise unremarkable exam, done by Dr. Dellis Filbert  . Osteoporosis     T score L femur neck = -2.6 (02/21/12)  . Fibromyalgia   . Hemorrhoids     Medications: Current Outpatient Prescriptions on File Prior to Visit  Medication Sig Dispense Refill  . albuterol (PROAIR HFA) 108 (90 BASE) MCG/ACT inhaler Inhale 1 puff into the lungs every 4 (four) hours as needed for wheezing or shortness of breath.  8.5 g  11  . budesonide-formoterol  (SYMBICORT) 160-4.5 MCG/ACT inhaler Inhale 2 puffs into the lungs 2 (two) times daily.  1 Inhaler  11  . esomeprazole (NEXIUM) 40 MG capsule Take 1 capsule (40 mg total) by mouth daily.  30 capsule  11  . polyethylene glycol (MIRALAX / GLYCOLAX) packet Take 17 g by mouth daily as needed for mild constipation.      . Lactulose 20 GM/30ML SOLN Take 20 g by mouth daily as needed (for constipation).      . megestrol (MEGACE ES) 625 MG/5ML suspension Take 625 mg by mouth daily as needed (appitite).       Current Facility-Administered Medications on File Prior to Visit  Medication Dose Route Frequency Provider Last Rate Last Dose  . cyanocobalamin ((VITAMIN B-12)) injection 1,000 mcg  1,000 mcg Intramuscular Q30 days Acquanetta Chain, DO   1,000 mcg at 02/24/11 1218    Allergies: Allergies  Allergen Reactions  . Aripiprazole Other (See Comments)    Suicidal ideation, also with SSRI  . Chantix [Varenicline] Other (See Comments)    Irritability and depressed mood  . Cymbalta [Duloxetine Hcl] Other (See Comments)    Irritability. 3/11  . Pregabalin Hives    On trunk of body  . Zolpidem Tartrate Other (See Comments)    unknown    FH: Family History  Problem Relation Age of Onset  . Stroke Mother   . Diabetes Mother   . Heart disease Father   .  Lung cancer Daughter     SH: History   Social History  . Marital Status: Divorced    Spouse Name: N/A    Number of Children: N/A  . Years of Education: N/A   Social History Main Topics  . Smoking status: Current Every Day Smoker -- 1.00 packs/day for 34 years    Types: Cigarettes  . Smokeless tobacco: Never Used     Comment: hx drugs non since 95/ TRYING TO QUITS/ HAS CUT BACK  . Alcohol Use: No  . Drug Use: No  . Sexual Activity: None   Other Topics Concern  . None   Social History Narrative   10th grade education. Married then divorced mid 44's. One daughter. Crack cocaine about 1995 ish. One Mollie [place] admit 2001 for SI.  Deemed disabled 04/01/2004 By [facility] due to documented health and mental issues.    Review of Systems: Constitutional: Negative for fever, chills and weight loss.  Eyes: Negative for blurred vision.  Respiratory: Negative for cough and shortness of breath.  Cardiovascular: Negative for chest pain, palpitations and leg swelling.  Gastrointestinal: Negative for nausea, vomiting, abdominal pain, diarrhea, constipation and blood in stool.  Genitourinary: Negative for dysuria, urgency and frequency.  Musculoskeletal: Negative for myalgias and back pain.  Neurological: Negative for dizziness, weakness and headaches.     Objective:   Vital Signs: Filed Vitals:   03/31/14 1458  BP: 111/78  Pulse: 84  Temp: 97.5 F (36.4 C)  TempSrc: Oral  Height: 5\' 7"  (1.702 m)  Weight: 95 lb 12.8 oz (43.455 kg)  SpO2: 100%      BP Readings from Last 3 Encounters:  03/31/14 111/78  02/22/14 95/55  02/20/14 110/75    Physical Exam: Constitutional: Vital signs reviewed.  Patient is a very frail middle age woman appearing much older than her staged age.  Paces about the exam room and halls.   Head: Normocephalic and atraumatic. Eyes: PERRL, EOMI, conjunctivae nl, no scleral icterus.  Throat: Edentulous.   Neck: Supple. Cardiovascular: RRR, no MRG. Pulmonary/Chest: normal effort, non-tender to palpation, CTAB, no wheezes, rales, or rhonchi. Abdominal: Soft. NT/ND +BS. Musculoskeletal: No joint deformities.  Full ROM of her entire spine without any vertebral or paraspinal tenderness. SLR neg b/l.  Patellar and achilles reflex 2+ b/l.  Biceps 2+ b/l.  5/5 strength in UE b/l and LE b/l.  Gait appears normal although walks with a cane.   Neurological: A&O x3, cranial nerves II-XII are grossly intact, moving all extremities. Extremities: 2+DP b/l; no pitting edema. Skin: Warm, dry and intact. No rash. Psychiatric: Pt anxious pacing up and down the halls, coming into my workroom.  Does not make eye  contact.    Assessment & Plan:   Assessment and plan was discussed and formulated with my attending.

## 2014-03-31 NOTE — Assessment & Plan Note (Signed)
-  mammogram ordered -influenza vaccine declined today

## 2014-04-01 DIAGNOSIS — T40601A Poisoning by unspecified narcotics, accidental (unintentional), initial encounter: Secondary | ICD-10-CM | POA: Insufficient documentation

## 2014-04-01 LAB — PRESCRIPTION ABUSE MONITORING 15P, URINE
Amphetamine/Meth: NEGATIVE ng/mL
Barbiturate Screen, Urine: NEGATIVE ng/mL
Buprenorphine, Urine: NEGATIVE ng/mL
CARISOPRODOL, URINE: NEGATIVE ng/mL
COCAINE METABOLITES: NEGATIVE ng/mL
CREATININE, URINE: 77.38 mg/dL (ref >20.0)
Cannabinoid Scrn, Ur: NEGATIVE ng/mL
Methadone Screen, Urine: NEGATIVE ng/mL
Propoxyphene: NEGATIVE ng/mL
TRAMADOL UR: NEGATIVE ng/mL
Zolpidem, Urine: NEGATIVE ng/mL

## 2014-04-01 MED ORDER — OXYMORPHONE HCL 10 MG PO TABS: 10.0000 mg | ORAL_TABLET | Freq: Two times a day (BID) | ORAL | Status: DC | PRN

## 2014-04-01 MED ORDER — OXYMORPHONE HCL 10 MG PO TABS
10.0000 mg | ORAL_TABLET | Freq: Three times a day (TID) | ORAL | Status: DC | PRN
Start: 2014-04-01 — End: 2014-05-21

## 2014-04-01 NOTE — Assessment & Plan Note (Signed)
Most likely due to chronic opioid use.  She was in the ED recently with abdominal pain found by imaging to be full of stool. -referral to [facility] -miralax daily

## 2014-04-01 NOTE — Assessment & Plan Note (Addendum)
Pt reports anxiety even though she is on xanax 2mg  three times daily.  When asked what she worries about she stated "life."  She endorses currently worrying about a family member who is in the hospital.  She paces the entire time I was in the exam room and even when I exited the room.  In fact, she came to the office where I was sitting and was trying to look at what I was doing.  I asked if she could please wait in the examination room.  She appears to be very manipulative to me.  I discussed with her the appropriateness of continuing xanax and she replied "well I don't want to have a seizure."  I reassured her that I had spoken with our pharmacist who worked out a plan to ensure a safe taper of her to come off xanax.  Additionally, I discussed with her that according to experts, xanax should not be used longer than 6 weeks (appropriate for short-term use) at the most.  I also discussed counseling with her and she says that she talks with her preacher.  I told her that was great, but that she needed a professional.  She keeps stating that she doesn't want to talk to a stranger or someone that "doesn't care about her."  I also reminded her that she signed a pain contract where she agreed to participate in any treatment that her physician recommends or she would be in violation.  She also is not taking wellbutrin (on her med list) and states that "I don't like to take medicines."  However, she likes to take medicines that SHE wants to take.  Apparently, she has a long h/o refusing mental health services in the past.  I have many concerns regarding this patient in terms of her anxiety/mental health issues exacerbating her chronic pain syndrome and this needs to be addressed.  Additionally, it is inappropriate for her to be on xanax for years.  With her h/o apparent SI with SSRI's, it is imperative that she see psychiatry if an SSRI does not control her anxiety.  We will taper xanax slowly as follows: she received #90  2mg  tabs on 8/21 according to [place] Controlled Substance Reporting System which should last her until 9/21 at which time she will taper down to #90 1mg  tabs. 9/21,   #90 1mg  tabs (already sent to [facility]) 10/21, #60 1mg  tabs 11/21, #30 1mg  tabs 12/21, stop-->no further refills.  -called [facility] pharmacy to void her additional refills of current xanax dosage -Gave verbal prescription to [facility] pharmacy for xanax 1mg  three times daily as needed for refill on 04/21/14 for #90 with no refills -begin celexa 20mg  at next visit -will sign contract for xanax from [facility] pharmacy  -will bring medication bottles to every visit  -will require that she go to counseling as part of her xanax contract -will have follow-up appt every 3-4 weeks to assess her progress  -will refer to psychiatry if pt expresses SI while transitioning to celexa  -referral to [facility] for counseling options  -will void contract if she misses any appointments  -panel 15 today

## 2014-04-01 NOTE — Assessment & Plan Note (Addendum)
Of note, when I enter the room she reports "I am in a hurry."  "I have to get to the [facility] as it is the last day to get my tag."  This is a common practice of patients who misuse/divert medications to try and pressure the clinician into giving them what they want because "they're in a hurry."  I told her she likely has a 2 week grace period to renew her tag and not be penalized.  She continues to pace the floor.  Pt reports no pain today.  She is able to mow the grass and "do whatever I want to."  She uses the fentanyl patch every other day and opana 10mg  3-4 times daily.  Denies recreational drug use even though her UDS +THC.  I mention to her trying physical therapy and she has tried that but didn't help.  I also discuss with her about how anxiety/depression may also worsen chronic pain and discuss her going to see a counselor and she wants to "talk to God."  She doesn't want to talk to someone who doesn't care about her.  Does not want surgery.  Has been dismissed from a pain clinic before due to testing +THC.  She exhibits odd behavior, pacing about the exam room, walking in the halls, coming into the office where I am sitting peering on the desk trying to see what papers I am looking at.  On physical exam, she is a very frail 64 yof appears much older than her age.  She has excellent ROM of her entire spine without any vertebral or paraspinal tenderness.  SLR neg b/l.  Patellar and achilles reflex 2+ b/l.  Biceps 2+ b/l.  5/5 strength in UE b/l and LE b/l.  Gait appears normal although walks with a cane.  There are many red flags in this patient with a h/o polysubstance abuse including crack/cocaine, methamphetamine, and THC (see above and previous notes).  She never brings in her medication bottles, denies use of THC, admits per previous notes to purchasing prescription drugs on the street.  Last UDS done in the ED was negative for opioids (even though she reports taking this taking fentanyl and opana daily).   Discussed with Dr. Maudie Mercury, tapering her narcotics today and will proceed with this.  Of note, she was treated in the ED earlier this year for opioid overdose.  She came in to get refills on her pain meds but is not due yet.  I told her when she comes back to see Butch Penny Plyler that she would be able to pick up her prescriptions then but will also need to see me and sign a new contract if she is to receive meds pain meds from me.   -will try to refer to a pain clinic (although I doubt they will accept her) -refer to [facility] for counseling options  -discussed that I will require she bring her pill bottles in next visit  -will refer to [facility] at next visit -will also put on NSAIDS (SCr OK and is a safer option for her -refill fentanyl for 10mcg (currently on 125mcg) on 9/25 #15 no refills; will give prescription at next visit on 04/28/14 -refill opana for 10mg  on 9/25 for #90 every 8 hr as needed for pain  -will sign contract to receive opana and fentanyl at [facility] only  -will require that she see Debera Lat as directed as part of her pain contract -will bring medication bottles to all subsequent visits -Panel  15 today -follow up on 04/28/14

## 2014-04-01 NOTE — Assessment & Plan Note (Signed)
Pt continues to smoke, not interested in quitting or "taking medications."

## 2014-04-01 NOTE — Assessment & Plan Note (Signed)
-  plans to taper down opioids and xanax

## 2014-04-01 NOTE — Assessment & Plan Note (Addendum)
It is unsafe for this patient to continue to receive opioids and benzos due to her ED visit earlier this year for disorientation, lethargy, and shortness of breath.  I have spoken with Dr. Maudie Mercury and we have come up with a plan to wean her off fentanyl and opana (also xanax). -see assessment and plan for anxiety and chronic pain

## 2014-04-02 ENCOUNTER — Telehealth: Payer: Self-pay | Admitting: Dietician

## 2014-04-02 NOTE — Telephone Encounter (Signed)
Called patient about scheduling a follow up appointment with RD. She asked that  It be on same day and closer to her appointment with Dr. Ferd Glassing and both be changed to same day as her mammogram. She also said she bought a few of the foods we had talked about and is drinking more milk. Had a BM yesterday and today.   Message sent to front office to reschedule Dr. Ferd Glassing appointment to the 30th and schedule an appointment with me on the same day.

## 2014-04-02 NOTE — Progress Notes (Signed)
Case discussed with Dr. Gordy Levan soon after the resident saw the patient. We reviewed the resident's history and exam and pertinent patient test results. I agree with the assessment, diagnosis, and plan of care documented in the resident's note.  [name] management is challenging.  I agree with Dr. Gordy Levan that there are many red flags in her history and that we need to be careful/responsible in our narcotic and benzo prescribing to protect the patient and the public.  I agree with referral to psychiatry for management of her anxiety.  I am hesitant to begin SSRI therapy given the reported history of suicide ideation with SSRIs.  As Psychiatric evaluation and intervention is vital to optimal management of her anxiety, given her reaction to SSRIs, we will insist she be seen and evaluated by them if benzodiazepines are to be continued in the future (will require Psychiatry's endorsement as the best approach for [name]).  She currently seems very functional on her narcotic regimen which is our goal, although red flags for abuse/misuse remain as noted above.  I agree with a trial of a slow taper of her narcotics to see if we can maintain functionality on lower doses.  [name] will be required to actively participate in her therapy that will include non-narcotic adjuvants to her pain control regimen.  With her red flags, we will stress to her that we expect strict compliance with the pain contract, and if she fails to do so, we will restrict or eliminate narcotic prescribing via the [facility].

## 2014-04-03 ENCOUNTER — Telehealth: Payer: Self-pay | Admitting: Licensed Clinical Social Worker

## 2014-04-03 ENCOUNTER — Encounter: Payer: Self-pay | Admitting: Licensed Clinical Social Worker

## 2014-04-03 NOTE — Telephone Encounter (Signed)
CSW placed call to Ms. Acres and notified pt of appt scheduled at [facility], [place], 04/09/14 at 1500.  The agency is on both pt's Medicare and Medicaid provider listing and close distance to pt's home.  CSW provided Ms. Haupert with contact information for [facility].   Pt returned call to CSW requesting specific directions.  CSW utilized [employer] and informed Ms. Shiel of directions.  Directions mailed along with map, letter and ROI to Ms. Zale.  Pt denies additional needs at this time.  Pt has CSW contact information and is aware CSW is available to assist as needed. 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  Pt to be seen by Dr. Tamera Reason first for substance abuse assessment then counseling.  Agency provide both Substance Abuse and Psychotherapy.  Agency aware, CSW requesting ROI.

## 2014-04-03 NOTE — Telephone Encounter (Signed)
[name] was referred to CSW for referrals to [facility] providers for anxiety.  CSW placed call to [name].  Pt states she is not really interested in seeing a counselor, as she talks with her preacher and people at church, but  told her PCP she would try.  CSW suggested possibility of finding a counselor that uses spirituality as a part of therapy.  Pt in agreement and requesting CSW to schedule an appointment.  [name] requesting agency to not be too far away as she is limited on gas money.  Pt is aware she is eligible for Medicaid medical transportation but opts to drive herself.  [name] requesting afternoon appointment on M,W, or Friday.

## 2014-04-03 NOTE — Telephone Encounter (Signed)
Called patient to tell her she has to see Dr. Gordy Levan and Dr. Gordy Levan has limited availability. Asked her to call front office to discuss her appointment with Dr. Gordy Levan and arrange for an appointment with me on same day. She agreed to call the front office.

## 2014-04-04 LAB — MEPERIDINE (GC/LC/MS), URINE
Meperidine (GC/LC/MS), ur confirm: 118 ng/mL — ABNORMAL HIGH (ref <100)
Normeperidine (GC/LC/MS), ur confirm: >2500 ng/mL — ABNORMAL HIGH (ref <100)

## 2014-04-04 LAB — OPIATES/OPIOIDS (LC/MS-MS)
CODEINE URINE: NEGATIVE ng/mL (ref <50)
HYDROCODONE: NEGATIVE ng/mL (ref <50)
Hydromorphone: NEGATIVE ng/mL (ref <50)
Morphine Urine: NEGATIVE ng/mL (ref <50)
Norhydrocodone, Ur: NEGATIVE ng/mL (ref <50)
Noroxycodone, Ur: NEGATIVE ng/mL (ref <50)
OXYMORPHONE, URINE: 15328 ng/mL — AB (ref <50)
Oxycodone, ur: NEGATIVE ng/mL (ref <50)

## 2014-04-04 LAB — OXYCODONE, URINE (LC/MS-MS)
NOROXYCODONE, UR: NEGATIVE ng/mL (ref <50)
Oxycodone, ur: NEGATIVE ng/mL (ref <50)
Oxymorphone: 15328 ng/mL — ABNORMAL HIGH (ref <50)

## 2014-04-04 LAB — BENZODIAZEPINES (GC/LC/MS), URINE
Alprazolam metabolite (GC/LC/MS), ur confirm: 838 ng/mL — ABNORMAL HIGH (ref <25)
Clonazepam metabolite (GC/LC/MS), ur confirm: NEGATIVE ng/mL (ref <25)
Flurazepam metabolite (GC/LC/MS), ur confirm: NEGATIVE ng/mL (ref <50)
Lorazepam (GC/LC/MS), ur confirm: NEGATIVE ng/mL (ref <50)
Midazolam (GC/LC/MS), ur confirm: NEGATIVE ng/mL (ref <50)
NORDIAZEPAMU: 118 ng/mL — AB (ref <50)
Oxazepam (GC/LC/MS), ur confirm: 1021 ng/mL — ABNORMAL HIGH (ref <50)
TEMAZEPAMU: 353 ng/mL — AB (ref <50)
Triazolam metabolite (GC/LC/MS), ur confirm: NEGATIVE ng/mL (ref <50)

## 2014-04-04 LAB — FENTANYL (GC/LC/MS), URINE
FENTANYL (GC/MS) CONFIRM: 52.8 ng/mL — AB (ref <0.5)
NORFENTANYL (GC/MS) CONFIRM: 427.3 ng/mL — AB (ref <0.5)

## 2014-04-24 ENCOUNTER — Ambulatory Visit (INDEPENDENT_AMBULATORY_CARE_PROVIDER_SITE_OTHER): Payer: PRIVATE HEALTH INSURANCE | Admitting: Internal Medicine

## 2014-04-24 ENCOUNTER — Encounter: Payer: Self-pay | Admitting: Internal Medicine

## 2014-04-24 VITALS — BP 117/71 | Temp 98.2°F | Ht 67.0 in | Wt 91.3 lb

## 2014-04-24 DIAGNOSIS — J209 Acute bronchitis, unspecified: Secondary | ICD-10-CM

## 2014-04-24 MED ORDER — GUAIFENESIN 100 MG/5ML PO SOLN: 5.0000 mL | ORAL | Status: DC | PRN

## 2014-04-24 MED ORDER — AZITHROMYCIN 250 MG PO TABS: ORAL_TABLET | ORAL | Status: DC

## 2014-04-24 NOTE — Progress Notes (Signed)
   Subjective:    Patient ID: [name], female    DOB: 01-Aug-1961, 53 y.o.   MRN: [id number]  URI  Associated symptoms include congestion, coughing and a sore throat. Pertinent negatives include no abdominal pain, diarrhea, dysuria, nausea, rash, vomiting or wheezing.   [name] is a 53 year old woman with PMH of generalized anxiety disorder, chronic pain syndrome, tobacco smoking, who presents accompanied by her friend for evaluation of a cough productive scantly productive of yellow/green sputum for 3 days, runny nose with clear discharge, decreased appetite, and malaise. She denies fever but states that she has been feeling cold and tired but is able to eat and drink with no nausea/vomiting/diarrhea.  She denies sick contacts.    Review of Systems  Constitutional: Positive for chills, appetite change and fatigue. Negative for fever, diaphoresis and unexpected weight change.  HENT: Positive for congestion and sore throat.   Respiratory: Positive for cough. Negative for shortness of breath and wheezing.   Gastrointestinal: Negative for nausea, vomiting, abdominal pain and diarrhea.  Genitourinary: Negative for dysuria.  Skin: Negative for rash.  Neurological: Negative for dizziness, weakness and light-headedness.  Psychiatric/Behavioral: Negative for confusion and agitation.       Objective:   Physical Exam  Nursing note and vitals reviewed. Constitutional: She appears well-developed. No distress.  tired appearing, thin    HENT:  Head: Normocephalic.  Mouth/Throat: Oropharynx is clear and moist. No oropharyngeal exudate.  Cardiovascular: Normal rate and regular rhythm.   Pulmonary/Chest: Effort normal. No respiratory distress. She has no wheezes. She has no rales.  Scattered rhonchi through all lung fields.  Occasional productive cough  Abdominal: Soft. She exhibits no distension. There is no tenderness.  Musculoskeletal: She exhibits no edema and no tenderness.    Neurological: She is alert.  Skin: Skin is warm and dry. She is not diaphoretic.  Psychiatric:  Flat affect          Assessment & Plan:

## 2014-04-24 NOTE — Patient Instructions (Signed)
-Stay hydrated, this will be very important for your recovery.  -Start taking azithromcyin, Take 2 tablets (500mg ) today, then 1 tablet (250mg ) daily for 4 days until you finish it.  -Follow up with [place] in 1 week.   Please bring your medicines with you each time you come.   Medicines may be  Eye drops  Herbal   Vitamins  Pills  Seeing these help [place] take care of you.  Acute Bronchitis Bronchitis is inflammation of the airways that extend from the windpipe into the lungs (bronchi). The inflammation often causes mucus to develop. This leads to a cough, which is the most common symptom of bronchitis.  In acute bronchitis, the condition usually develops suddenly and goes away over time, usually in a couple weeks. Smoking, allergies, and asthma can make bronchitis worse. Repeated episodes of bronchitis may cause further lung problems.  CAUSES Acute bronchitis is most often caused by the same virus that causes a cold. The virus can spread from person to person (contagious) through coughing, sneezing, and touching contaminated objects. SIGNS AND SYMPTOMS   Cough.   Fever.   Coughing up mucus.   Body aches.   Chest congestion.   Chills.   Shortness of breath.   Sore throat.  DIAGNOSIS  Acute bronchitis is usually diagnosed through a physical exam. Your health care provider will also ask you questions about your medical history. Tests, such as chest X-rays, are sometimes done to rule out other conditions.  TREATMENT  Acute bronchitis usually goes away in a couple weeks. Oftentimes, no medical treatment is necessary. Medicines are sometimes given for relief of fever or cough. Antibiotic medicines are usually not needed but may be prescribed in certain situations. In some cases, an inhaler may be recommended to help reduce shortness of breath and control the cough. A cool mist vaporizer may also be used to help thin bronchial secretions and make it easier to clear the chest.    HOME CARE INSTRUCTIONS  Get plenty of rest.   Drink enough fluids to keep your urine clear or pale yellow (unless you have a medical condition that requires fluid restriction). Increasing fluids may help thin your respiratory secretions (sputum) and reduce chest congestion, and it will prevent dehydration.   Take medicines only as directed by your health care provider.  If you were prescribed an antibiotic medicine, finish it all even if you start to feel better.  Avoid smoking and secondhand smoke. Exposure to cigarette smoke or irritating chemicals will make bronchitis worse. If you are a smoker, consider using nicotine gum or skin patches to help control withdrawal symptoms. Quitting smoking will help your lungs heal faster.   Reduce the chances of another bout of acute bronchitis by washing your hands frequently, avoiding people with cold symptoms, and trying not to touch your hands to your mouth, nose, or eyes.   Keep all follow-up visits as directed by your health care provider.  SEEK MEDICAL CARE IF: Your symptoms do not improve after 1 week of treatment.  SEEK IMMEDIATE MEDICAL CARE IF:  You develop an increased fever or chills.   You have chest pain.   You have severe shortness of breath.  You have bloody sputum.   You develop dehydration.  You faint or repeatedly feel like you are going to pass out.  You develop repeated vomiting.  You develop a severe headache. MAKE SURE YOU:   Understand these instructions.  Will watch your condition.  Will get help right away  if you are not doing well or get worse. Document Released: 08/25/2004 Document Revised: 12/02/2013 Document Reviewed: 01/08/2013 [facility] Patient Information 2015 [place], [place]. This information is not intended to replace advice given to you by your health care provider. Make sure you discuss any questions you have with your health care provider.

## 2014-04-24 NOTE — Assessment & Plan Note (Addendum)
Pneumonia (CAP) less likely with no fever, no confusion but possible.  Will treat as bronchitis in COPD, mild exacerbation.   Rx Zpack (500mg , then 250mg  daily for 4 more days) Encouraged oral hydration Robitussin for cough Close follow up next week Pt and her fried advised that the pt should go to the ED if she is not able to tolerate intake per mouth or if her symptoms worsen. They verbalized understanding. Her friend will pick up a few groceries for her (chiecken soup, tea, water), and will take her to [facility] to pick up her prescriptions.

## 2014-04-24 NOTE — Progress Notes (Signed)
Medicine attending: Medical history, presenting problems, physical findings, and medications, reviewed with resident physician Dr. Kennerly and I concur with her evaluation and management plan. Jamil Armwood, M.D., FACP 

## 2014-04-25 ENCOUNTER — Encounter: Payer: PRIVATE HEALTH INSURANCE | Admitting: Dietician

## 2014-04-28 ENCOUNTER — Encounter: Payer: Self-pay | Admitting: Internal Medicine

## 2014-04-28 ENCOUNTER — Ambulatory Visit (INDEPENDENT_AMBULATORY_CARE_PROVIDER_SITE_OTHER): Payer: PRIVATE HEALTH INSURANCE | Admitting: Internal Medicine

## 2014-04-28 ENCOUNTER — Encounter: Payer: PRIVATE HEALTH INSURANCE | Admitting: Dietician

## 2014-04-28 VITALS — BP 135/95 | HR 87 | Temp 97.4°F | Ht 67.0 in | Wt 91.8 lb

## 2014-04-28 DIAGNOSIS — F19988 Other psychoactive substance use, unspecified with other psychoactive substance-induced disorder: Secondary | ICD-10-CM

## 2014-04-28 DIAGNOSIS — G894 Chronic pain syndrome: Secondary | ICD-10-CM

## 2014-04-28 DIAGNOSIS — F12980 Cannabis use, unspecified with anxiety disorder: Secondary | ICD-10-CM

## 2014-04-28 DIAGNOSIS — F1210 Cannabis abuse, uncomplicated: Secondary | ICD-10-CM

## 2014-04-28 NOTE — Progress Notes (Signed)
Patient ID: [name], female   DOB: [birth year], 53 y.o.   MRN: [id number]    Subjective:   Patient ID: [name] female    DOB: 1961-04-28 53 y.o.    MRN: [id number] Health Maintenance Due: Health Maintenance Due  Topic Date Due  . Tetanus/tdap  01/19/1978  . Mammogram  02/20/2014  . Influenza Vaccine  03/01/2014    _________________________________________________  HPI: [name] is a 53 y.o. female here for a routine visit.  Pt has a PMH outlined below.  Please see problem-based charting assessment and plan note for further details of medical issues addressed at today's visit.  PMH: Past Medical History  Diagnosis Date  . Anxiety   . Depression   . Cervical cancer     Discovered by pap smear, treated by Dr. Charlesetta Garibaldi with LEEP procedure.  Pap since that time has been normal.  . Chronic back pain      her CT of the head without contrast June 28, 2008- moderate to large right paracentral disc protrusion at C4-C5 resulting in a right C5 neuroforaminal stenosis and probably some degree of spinal stenosis. C. bilateral C6 neuroforaminal stenosis related to  degenerative hypertrophy, no acute fracture or loose pieces identifying the cervical spine, ligamentous injury is not excluded.    Marland Kitchen Headache(784.0)   . COPD (chronic obstructive pulmonary disease)   . Arthritis   . GERD (gastroesophageal reflux disease)  October 2011     [facility], esophagogram - tiny amount of gastroesophageal reflux otherwise unremarkable exam, done by Dr. Dellis Filbert  . Osteoporosis     T score L femur neck = -2.6 (02/21/12)  . Fibromyalgia   . Hemorrhoids     Medications: Current Outpatient Prescriptions on File Prior to Visit  Medication Sig Dispense Refill  . albuterol (PROAIR HFA) 108 (90 BASE) MCG/ACT inhaler Inhale 1 puff into the lungs every 4 (four) hours as needed for wheezing or shortness of breath.  8.5 g  11  . ALPRAZolam (XANAX) 1 MG tablet Take 1 tablet (1 mg total) by  mouth 3 (three) times daily as needed for anxiety.  90 tablet  0  . azithromycin (ZITHROMAX) 250 MG tablet Take 2 tablets today then 1 tablet daily for 4 more days until you finish it.  6 each  0  . budesonide-formoterol (SYMBICORT) 160-4.5 MCG/ACT inhaler Inhale 2 puffs into the lungs 2 (two) times daily.  1 Inhaler  11  . esomeprazole (NEXIUM) 40 MG capsule Take 1 capsule (40 mg total) by mouth daily.  30 capsule  11  . fentaNYL (DURAGESIC - DOSED MCG/HR) 75 MCG/HR Place 1 patch (75 mcg total) onto the skin every 3 (three) days.  15 patch  0  . guaiFENesin (ROBITUSSIN) 100 MG/5ML SOLN Take 5 mLs (100 mg total) by mouth every 4 (four) hours as needed for cough or to loosen phlegm.  1200 mL  0  . Lactulose 20 GM/30ML SOLN Take 20 g by mouth daily as needed (for constipation).      . megestrol (MEGACE ES) 625 MG/5ML suspension Take 625 mg by mouth daily as needed (appitite).      Marland Kitchen oxymorphone (OPANA) 10 MG tablet Take 1 tablet (10 mg total) by mouth every 8 (eight) hours as needed for pain.  90 tablet  0  . oxymorphone (OPANA) 10 MG tablet Take 1 tablet (10 mg total) by mouth every 12 (twelve) hours as needed for pain.  60 tablet  0  . polyethylene  glycol (MIRALAX / GLYCOLAX) packet Take 17 g by mouth daily as needed for mild constipation.       Current Facility-Administered Medications on File Prior to Visit  Medication Dose Route Frequency Provider Last Rate Last Dose  . cyanocobalamin ((VITAMIN B-12)) injection 1,000 mcg  1,000 mcg Intramuscular Q30 days Acquanetta Chain, DO   1,000 mcg at 02/24/11 1218    Allergies: Allergies  Allergen Reactions  . Aripiprazole Other (See Comments)    Suicidal ideation, also with SSRI  . Chantix [Varenicline] Other (See Comments)    Irritability and depressed mood  . Cymbalta [Duloxetine Hcl] Other (See Comments)    Irritability. 3/11  . Pregabalin Hives    On trunk of body  . Zolpidem Tartrate Other (See Comments)    unknown    FH: Family  History  Problem Relation Age of Onset  . Stroke Mother   . Diabetes Mother   . Heart disease Father   . Lung cancer Daughter     SH: History   Social History  . Marital Status: Divorced    Spouse Name: N/A    Number of Children: N/A  . Years of Education: N/A   Social History Main Topics  . Smoking status: Current Every Day Smoker -- 1.00 packs/day for 34 years    Types: Cigarettes  . Smokeless tobacco: Never Used     Comment: hx drugs non since 95/ TRYING TO QUITS/ HAS CUT BACK  . Alcohol Use: No  . Drug Use: No  . Sexual Activity: None   Other Topics Concern  . None   Social History Narrative   10th grade education. Married then divorced mid 47's. One daughter. Crack cocaine about 1995 ish. One Mollie [place] admit 2001 for SI. Deemed disabled 04/01/2004 By [facility] due to documented health and mental issues.    Review of Systems: Constitutional: Negative for fever, chills, +weight loss.  Eyes: Negative for blurred vision.  Respiratory: Negative for cough and shortness of breath.  Cardiovascular: Negative for chest pain, palpitations and leg swelling.  Gastrointestinal: Negative for nausea, vomiting, abdominal pain, diarrhea, constipation and blood in stool.  Genitourinary: Negative for dysuria, urgency and frequency.  Musculoskeletal: Negative for myalgias and back pain.  Neurological: Negative for dizziness, weakness and headaches.     Objective:   Vital Signs: Filed Vitals:   04/28/14 1355  BP: 135/95  Pulse: 87  Temp: 97.4 F (36.3 C)  TempSrc: Oral  Height: 5\' 7"  (1.702 m)  Weight: 91 lb 12.8 oz (41.64 kg)  SpO2: 96%      BP Readings from Last 3 Encounters:  04/28/14 135/95  04/24/14 117/71  03/31/14 111/78    Physical Exam: Constitutional: Vital signs reviewed.  Patient is a disheveled female who is cachetic and appears much older than her age. Head: Normocephalic and atraumatic. Eyes: PERRL, EOMI, conjunctivae nl, no scleral icterus.    Cardiovascular: RRR, no MRG. Pulmonary/Chest: normal effort, non-tender to palpation, CTAB, no wheezes, rales, or rhonchi. Abdominal: Thin. Soft. NT/ND +BS. Neurological: A&O x3, cranial nerves II-XII are grossly intact, moving all extremities. Extremities: 2+DP b/l; no pitting edema. Skin: Warm, dry and intact. No rash.   Assessment & Plan:   Assessment and plan was discussed and formulated with my attending.

## 2014-04-28 NOTE — Patient Instructions (Signed)
Thank you for your visit today. I hope you feel better.   We are going to taper you off of your opioid medications  Please take opana 10mg  three times daily as needed for pain for 1 month only.   Please use the fentanyl patch every three days. Please take xanax 1mg  three times daily as needed for anxiety.   Please bring your pill bottles to EVERY visit--this includes fentanyl patches.  Please bring your fentanyl patches at your next visit.

## 2014-04-30 ENCOUNTER — Ambulatory Visit: Payer: PRIVATE HEALTH INSURANCE

## 2014-04-30 NOTE — Assessment & Plan Note (Addendum)
Patient was referred to [facility] but apparently got there and wouldn't talk with them.  The center had to call the clinic to find out exactly why she was there.  She stated that he asked me why I was there and said I didn't need to be there.  I reminded her that she was on medication for anxiety and that she had expressed SI in the past.  She adamantly denied ever expressing SI--stating that she had a granddaughter and that she would "never do anything like that."  However, according to chart review, she had expressed SI each time she was put on an appropriate SSRI for long term management of anxiety/depression.  She again does not make eye contact and is very withdrawn.  I explained to her that she would not be able to continue getting xanax from me since it was inappropriate treatment for her anxiety.  She said "I know that there are other people in this clinic getting xanax."  I explained to her that this was not relevant to her situation.  She stated that she was not going to continue to go to the [facility].  Of note, on her previous UDS she was positive for demerol which is not being prescribed by any provider according to the [place] narcotic database.  I did refill her xanax and decreased the dose to 1mg  three times daily as needed for anxiety for one month.  She will continue the taper next month of 1mg  twice daily and then the next month, 1mg  daily as needed for anxiety. I planned to start her on an SSRI while tapering her off the xanax with the help of the [facility] but she is unwilling to cooperate in the treatment plan set forth.  She refused to sign the pain contract stating that "I am not signing that."  She also stated that I would be lucky if I say her again because she did not plan to come back.  I told her that I would like to see her every month since she has been losing weight and has also been seeing [employer] .  She declined to come monthly stating that she had never had this trouble  before and that she only used to come every 3 months.  I attempted to explain to her that I was very concerned about her weight loss and her anxiety but she refused to listen.  She brought her medication bottles with her and had an almost full bottle of xanax and opana.  Unfortunately she did not bring her fentanyl patches.   -follow-up in 1 month -continue taper as planned  -will obtain records from [facility]

## 2014-05-01 ENCOUNTER — Other Ambulatory Visit: Payer: Self-pay | Admitting: *Deleted

## 2014-05-01 DIAGNOSIS — J209 Acute bronchitis, unspecified: Secondary | ICD-10-CM

## 2014-05-01 NOTE — Assessment & Plan Note (Addendum)
Pt presents for follow-up and brings her medication bottles with her today which are mostly full.  She does not however bring her fentanyl patches.  Her opana bottle was mostly full and filled on 9/25 for #120 with no refills.  She also had xanax filled on 9/15 for #90 1 mg three times daily as needed.  Upon checking the [facility] database she received her prescription on 6/10 for a 3 month supply of opana which was not filled until 8/25?  And then again filled on 9/25.  However on 02/25/14 she filled opana for #120 tabs.  Again, I am concerned regarding this patient's misuse and abuse of pain medications.  Additionally, she had an inappropriate UDS again which she had been warned about in the past after she had an inappropriate UDS.  I think it is not in her best interest or the [facility] to continue to provide this patient with pain management.   -follow up in 1 month -referral to a pain clinic -patient refused to sign pain contract with me -would not continue to provide pain meds from this point forward given her inappropriate UDS and opioid overdose in 08/2013 -no refills provided today given she refilled opana on 9/25 for #120 (a prescription that was written on 01/08/14)

## 2014-05-02 NOTE — Progress Notes (Signed)
INTERNAL MEDICINE TEACHING ATTENDING ADDENDUM - Hernan Turnage, MD: I reviewed and discussed at the time of visit with the resident Dr. Gill, the patient's medical history, physical examination, diagnosis and results of pertinent tests and treatment and I agree with the patient's care as documented.  

## 2014-05-02 NOTE — Telephone Encounter (Signed)
Pt aware - offered appt today - did not want to come today and offered to give her an appt 05/05/14. - she states she will call on Mon.

## 2014-05-05 ENCOUNTER — Encounter: Payer: PRIVATE HEALTH INSURANCE | Admitting: Dietician

## 2014-05-12 ENCOUNTER — Other Ambulatory Visit: Payer: Self-pay | Admitting: *Deleted

## 2014-05-12 DIAGNOSIS — F419 Anxiety disorder, unspecified: Secondary | ICD-10-CM

## 2014-05-13 ENCOUNTER — Telehealth: Payer: Self-pay | Admitting: *Deleted

## 2014-05-13 NOTE — Telephone Encounter (Signed)
Pt called about generic Xanax - aware recent request - too early.  Since this AM dizzy, heart racing and several panic attacks.  Pt states she knows she can not get med till 05/15/14 - she will feel better knowing this med she can get on 05/15/14. Offered pt to go to ER - refuses to go. Pt is out of medicine. Uses [facility]. Hilda Blades Myrene Bougher RN 05/13/14 12:10PM

## 2014-05-13 NOTE — Telephone Encounter (Signed)
Called /informed pt it is too early for a refill on Xanax per Dr Gordy Levan; pt stated she's out and refill is due Thursday. Wants to know why her doctor would not refill her medication 2 days early? I told her I did not know the answer.Stated she never had any problem with any of the doctors here at the clinic. And wants Dr Gordy Levan to call her. I told her I would give her the message. Telephone #  [phone].

## 2014-05-14 ENCOUNTER — Other Ambulatory Visit: Payer: Self-pay | Admitting: Internal Medicine

## 2014-05-14 NOTE — Telephone Encounter (Signed)
Pt was informed - see note.

## 2014-05-14 NOTE — Telephone Encounter (Signed)
I referred [name] to the [facility] for treatment of her depression/anxiety.  Last OV, she indicated that she did not need to go to the [facility], but this remains my recommendation.  If she is having a crisis, I suggest that she contact the [facility] to be seen emergently.  Please call and let her know this.  Thanks!

## 2014-05-14 NOTE — Telephone Encounter (Signed)
Talked with pt - has been x 2 to [facility]. Appt has been made with Dr Gordy Levan 05/19/14 3:45PM.

## 2014-05-16 ENCOUNTER — Encounter: Payer: Self-pay | Admitting: Internal Medicine

## 2014-05-16 ENCOUNTER — Other Ambulatory Visit: Payer: Self-pay | Admitting: Internal Medicine

## 2014-05-16 DIAGNOSIS — F419 Anxiety disorder, unspecified: Secondary | ICD-10-CM

## 2014-05-16 DIAGNOSIS — F12980 Cannabis use, unspecified with anxiety disorder: Secondary | ICD-10-CM

## 2014-05-16 MED ORDER — ALPRAZOLAM 1 MG PO TABS: 1.0000 mg | ORAL_TABLET | Freq: Two times a day (BID) | ORAL | Status: DC | PRN

## 2014-05-16 NOTE — Assessment & Plan Note (Addendum)
Dr Gordy Levan tapering down her Xanax. Issue is timing of next refill.   Filled July 23rd Filled Aug 21st (one day early) Filled Sep 15th - this was accidentally filled early by the pharmacy. 30 days after Aug 21st was Sep 20th so should have been filled Sep 20th 30 days after Sep 20th (the day it should have been filled) is Oct 20th.  30 days after Sep 15th was Oct 15th   I am Rxing #60 1 mg tabs (per Dr Ferd Glassing taper). This is an early refill if going by the Sep 20th refill date but is not an early refill since pt got it on Sep 15th. The risk of not refilling today is that the pt is at risk of withdrawal sz. The risk of refilling it today is pt gets used to getting early refills but this is moot since she is being tapered off anyway.  Will get [facility] Records.

## 2014-05-16 NOTE — Progress Notes (Signed)
Refill called in and pt informed

## 2014-05-19 ENCOUNTER — Encounter: Payer: PRIVATE HEALTH INSURANCE | Admitting: Internal Medicine

## 2014-05-21 ENCOUNTER — Encounter: Payer: Self-pay | Admitting: Internal Medicine

## 2014-05-21 ENCOUNTER — Other Ambulatory Visit: Payer: Self-pay | Admitting: Internal Medicine

## 2014-05-21 ENCOUNTER — Ambulatory Visit (INDEPENDENT_AMBULATORY_CARE_PROVIDER_SITE_OTHER): Payer: PRIVATE HEALTH INSURANCE | Admitting: Internal Medicine

## 2014-05-21 VITALS — BP 125/107 | HR 106 | Temp 97.4°F | Wt 91.4 lb

## 2014-05-21 DIAGNOSIS — Z0000 Encounter for general adult medical examination without abnormal findings: Secondary | ICD-10-CM

## 2014-05-21 DIAGNOSIS — G894 Chronic pain syndrome: Secondary | ICD-10-CM

## 2014-05-21 DIAGNOSIS — F1190 Opioid use, unspecified, uncomplicated: Secondary | ICD-10-CM

## 2014-05-21 DIAGNOSIS — Z23 Encounter for immunization: Secondary | ICD-10-CM

## 2014-05-21 DIAGNOSIS — F12980 Cannabis use, unspecified with anxiety disorder: Secondary | ICD-10-CM

## 2014-05-21 MED ORDER — OXYMORPHONE HCL 10 MG PO TABS: 10.0000 mg | ORAL_TABLET | Freq: Every day | ORAL | Status: DC | PRN

## 2014-05-21 MED ORDER — FENTANYL 75 MCG/HR TD PT72: 75.0000 ug | MEDICATED_PATCH | TRANSDERMAL | Status: DC

## 2014-05-21 NOTE — Patient Instructions (Signed)
Thank you for your visit today.   Please return to the internal medicine clinic in 1 month(s) or sooner if needed: 1. I encourage to go the [facility] for your anxiety.  2. I have refilled your opana today.  You may take opana once daily AS NEEDED. 3. I have refilled your fentanyl patch today for 74mcg.  You may take this every 72 hours. 4. Your xanax was previously refilled.  You may take this twice daily AS NEEDED for anxiety.   Please be sure to bring all of your medications with you to every visit; this includes herbal supplements, vitamins, eye drops, and any over-the-counter medications.   Should you have any questions regarding your medications and/or any new or worsening symptoms, please be sure to call the clinic at [phone].   If you believe that you are suffering from a life threatening condition or one that may result in the loss of limb or function, then you should call 911 or proceed to the nearest Emergency Department.

## 2014-05-22 ENCOUNTER — Other Ambulatory Visit: Payer: Self-pay | Admitting: Internal Medicine

## 2014-05-22 LAB — VITAMIN D 25 HYDROXY (VIT D DEFICIENCY, FRACTURES): VIT D 25 HYDROXY: 13 ng/mL — AB (ref 30–89)

## 2014-05-22 MED ORDER — VITAMIN D (ERGOCALCIFEROL) 1.25 MG (50000 UNIT) PO CAPS: 50000.0000 [IU] | ORAL_CAPSULE | ORAL | Status: DC

## 2014-05-22 NOTE — Assessment & Plan Note (Signed)
Pt requested a refill of xanax early which was granted.  She attempted to call a previous resident because I would not refill her medication early. She claimed to be having anxiety attacks and I instructed the nurse to tell her to contact the [facility] emergently to be seen as this is most appropriate.  Her response was she had been TWO times!  She doesn't seem to understand that if she has ongoing anxiety that at times has required 2mg  of xanax four times daily that she needs psychotherapy also.  I explained to her that this remains my recommendation.  She is down to twice daily AS needed.  I sent her to the [facility] and she went twice to see a therapist but does not feel she needs to go and refuses to go back.  She states that she does not need to go.  I explained to her that a patient who needs xanax four times daily needs psychotherapy.  Additionally, she was sent because she claims SI when she has been taken off xanax in the past and started on SSRIs according to chart review.  However, she continues to deny that she ever expressed SI and that she has a granddaughter that she wants to be here for.  So there is a discrepancy in what she is saying and what her chart reveals.  At any rate, she now states that she cannot afford the [facility].  She again, is manipulative and going to any length to get what she wants.  I would not refill her xanax early because her UDS was + for demerol and again, I am very concerned about her safety and the fact that she was in the ED with an overdose in January which she denies.  She at her last OV refused to sign a medication contract stating "I am not going to sign that." She became very angry when I confronted her with the fact that she was in the ED with an overdose and she stated "show me."  She continues to exhibit behavior of a patient addicted to controlled substances.  Actually asked Illene Regulus to be transferred to the care of another resident which was not  granted.  She was unhappy because I would not give her what she wants.   -will not refill xanax early (she is down to twice daily as needed) -next xanax refill will not be until 11/17 for #30 once daily as needed  -recommend pt to continue be seen at [facility]

## 2014-05-22 NOTE — Assessment & Plan Note (Addendum)
-  reminded her that she needs a mammogram  -will need pneumovax at next OV (has COPD) -influenza vaccine today

## 2014-05-22 NOTE — Assessment & Plan Note (Signed)
Pt states that she is unhappy about being tapered off the fentanyl, opana, and xanax.  I explained to her that for her safety this will be required.  She denied being in the ED in January for an overdose and also denied using any substances that have not been prescribed. However, her UDS was inappropriate during her last OV and has been appropriate in the past but yet she has continued to receive controlled substances.  She was previously at a pain clinic but was dismissed due to inappropriate UDS.  I reiterated that I was concerned for her safety and that I still suggested that she go to the [facility] but she initially stated that they told her not to come back.  I explained to her that I had spoken with her therapist and he stated that she did not want to be there.  She then states that she cannot afford to go.  I have tried to get her the help she needs to the best of my ability and she is resistant to all help because she doesn't think she had a problem with addiction.  Her therapist states that she just "wants to be left alone to live her life in [place]."  On exam, she is very thin appearing and is edentulous.  -taper opana to once daily as needed (refilled opana today for #30) -taper fentanyl to 55mcg every 3 days -return to clinic in 1 month

## 2014-05-22 NOTE — Progress Notes (Signed)
Patient ID: [name], female   DOB: [birth year], 53 y.o.   MRN: [id number]    Subjective:   Patient ID: [name] female    DOB: 11-Mar-1961 53 y.o.    MRN: [id number] Health Maintenance Due: Health Maintenance Due  Topic Date Due  . Tetanus/tdap  01/19/1978  . Mammogram  02/20/2014    _________________________________________________  HPI: [name] is a 53 y.o. female here for a routine visit.  Pt has a PMH outlined below.  Please see problem-based charting assessment and plan note for further details of medical issues addressed at today's visit.  PMH: Past Medical History  Diagnosis Date  . Anxiety   . Depression   . Cervical cancer     Discovered by pap smear, treated by Dr. Charlesetta Garibaldi with LEEP procedure.  Pap since that time has been normal.  . Chronic back pain      her CT of the head without contrast June 28, 2008- moderate to large right paracentral disc protrusion at C4-C5 resulting in a right C5 neuroforaminal stenosis and probably some degree of spinal stenosis. C. bilateral C6 neuroforaminal stenosis related to  degenerative hypertrophy, no acute fracture or loose pieces identifying the cervical spine, ligamentous injury is not excluded.    Marland Kitchen Headache(784.0)   . COPD (chronic obstructive pulmonary disease)   . Arthritis   . GERD (gastroesophageal reflux disease)  October 2011     Williams, esophagogram - tiny amount of gastroesophageal reflux otherwise unremarkable exam, done by Dr. Dellis Filbert  . Osteoporosis     T score L femur neck = -2.6 (02/21/12)  . Fibromyalgia   . Hemorrhoids     Medications: Current Outpatient Prescriptions on File Prior to Visit  Medication Sig Dispense Refill  . albuterol (PROAIR HFA) 108 (90 BASE) MCG/ACT inhaler Inhale 1 puff into the lungs every 4 (four) hours as needed for wheezing or shortness of breath.  8.5 g  11  . ALPRAZolam (XANAX) 1 MG tablet Take 1 tablet (1 mg total) by mouth 2 (two) times daily as needed  for anxiety. This must last 30 days  60 tablet  0  . azithromycin (ZITHROMAX) 250 MG tablet Take 2 tablets today then 1 tablet daily for 4 more days until you finish it.  6 each  0  . budesonide-formoterol (SYMBICORT) 160-4.5 MCG/ACT inhaler Inhale 2 puffs into the lungs 2 (two) times daily.  1 Inhaler  11  . esomeprazole (NEXIUM) 40 MG capsule Take 1 capsule (40 mg total) by mouth daily.  30 capsule  11  . guaiFENesin (ROBITUSSIN) 100 MG/5ML SOLN Take 5 mLs (100 mg total) by mouth every 4 (four) hours as needed for cough or to loosen phlegm.  1200 mL  0  . Lactulose 20 GM/30ML SOLN Take 20 g by mouth daily as needed (for constipation).      . megestrol (MEGACE ES) 625 MG/5ML suspension Take 625 mg by mouth daily as needed (appitite).      . polyethylene glycol (MIRALAX / GLYCOLAX) packet Take 17 g by mouth daily as needed for mild constipation.       Current Facility-Administered Medications on File Prior to Visit  Medication Dose Route Frequency Provider Last Rate Last Dose  . cyanocobalamin ((VITAMIN B-12)) injection 1,000 mcg  1,000 mcg Intramuscular Q30 days Acquanetta Chain, DO   1,000 mcg at 02/24/11 1218    Allergies: Allergies  Allergen Reactions  . Aripiprazole Other (See Comments)    Suicidal ideation,  also with SSRI  . Chantix [Varenicline] Other (See Comments)    Irritability and depressed mood  . Cymbalta [Duloxetine Hcl] Other (See Comments)    Irritability. 3/11  . Pregabalin Hives    On trunk of body  . Zolpidem Tartrate Other (See Comments)    unknown    FH: Family History  Problem Relation Age of Onset  . Stroke Mother   . Diabetes Mother   . Heart disease Father   . Lung cancer Daughter     SH: History   Social History  . Marital Status: Divorced    Spouse Name: N/A    Number of Children: N/A  . Years of Education: N/A   Social History Main Topics  . Smoking status: Current Every Day Smoker -- 1.00 packs/day for 34 years    Types: Cigarettes  .  Smokeless tobacco: Never Used     Comment: hx drugs non since 95/ TRYING TO QUITS/ HAS CUT BACK  . Alcohol Use: No  . Drug Use: No  . Sexual Activity: None   Other Topics Concern  . None   Social History Narrative   10th grade education. Married then divorced mid 59's. One daughter. Crack cocaine about 1995 ish. One Mollie [place] admit 2001 for SI. Deemed disabled 04/01/2004 By [facility] due to documented health and mental issues.    Review of Systems: Constitutional: Negative for fever, chills and +weight loss.  Eyes: Negative for blurred vision.  Respiratory: Negative for cough and shortness of breath.  Cardiovascular: Negative for chest pain, palpitations and leg swelling.  Gastrointestinal: Negative for nausea, vomiting, abdominal pain, diarrhea, constipation and blood in stool.  Genitourinary: Negative for dysuria, urgency and frequency.  Musculoskeletal: Negative for myalgias and back pain.  Neurological: Negative for dizziness, weakness and headaches.     Objective:   Vital Signs: Filed Vitals:   05/21/14 1512  BP: 125/107  Pulse: 106  Temp: 97.4 F (36.3 C)  TempSrc: Oral  Weight: 91 lb 6.4 oz (41.459 kg)  SpO2: 96%      BP Readings from Last 3 Encounters:  05/21/14 125/107  04/28/14 135/95  04/24/14 117/71    Physical Exam: Constitutional: Vital signs reviewed.  Patient appears much older than her age.  Is very ill appearing and cachetic.  Does not make eye contact and looks down at the floor the entire time.  Head: Normocephalic and atraumatic. Eyes: PERRL, EOMI, conjunctivae nl, no scleral icterus.  Neck: Supple. Cardiovascular: RRR, no MRG. Pulmonary/Chest: normal effort, CTAB, no wheezes, rales, or rhonchi. Abdominal: Thin. Soft. NT/ND +BS. Neurological: A&O x3, cranial nerves II-XII are grossly intact, moving all extremities. Extremities: 2+DP b/l; no pitting edema. Skin: Warm, dry and intact.    Assessment & Plan:   Assessment and plan was discussed  and formulated with my attending.

## 2014-05-23 ENCOUNTER — Encounter: Payer: Self-pay | Admitting: Internal Medicine

## 2014-05-26 ENCOUNTER — Encounter (HOSPITAL_COMMUNITY): Payer: Self-pay | Admitting: Emergency Medicine

## 2014-05-26 ENCOUNTER — Emergency Department (INDEPENDENT_AMBULATORY_CARE_PROVIDER_SITE_OTHER)
Admission: EM | Admit: 2014-05-26 | Discharge: 2014-05-26 | Disposition: A | Discharge status: Nursing Home (Includes ICF) | Payer: PRIVATE HEALTH INSURANCE | Source: Home / Self Care

## 2014-05-26 ENCOUNTER — Encounter: Payer: PRIVATE HEALTH INSURANCE | Admitting: Internal Medicine

## 2014-05-26 ENCOUNTER — Other Ambulatory Visit: Payer: Self-pay | Admitting: Internal Medicine

## 2014-05-26 ENCOUNTER — Telehealth: Payer: Self-pay | Admitting: *Deleted

## 2014-05-26 DIAGNOSIS — M791 Myalgia, unspecified site: Secondary | ICD-10-CM

## 2014-05-26 DIAGNOSIS — R5383 Other fatigue: Secondary | ICD-10-CM | POA: Diagnosis not present

## 2014-05-26 DIAGNOSIS — Z720 Tobacco use: Secondary | ICD-10-CM | POA: Insufficient documentation

## 2014-05-26 DIAGNOSIS — R1084 Generalized abdominal pain: Secondary | ICD-10-CM

## 2014-05-26 DIAGNOSIS — R109 Unspecified abdominal pain: Secondary | ICD-10-CM | POA: Diagnosis not present

## 2014-05-26 DIAGNOSIS — J449 Chronic obstructive pulmonary disease, unspecified: Secondary | ICD-10-CM | POA: Insufficient documentation

## 2014-05-26 DIAGNOSIS — J439 Emphysema, unspecified: Secondary | ICD-10-CM

## 2014-05-26 DIAGNOSIS — R5381 Other malaise: Secondary | ICD-10-CM

## 2014-05-26 DIAGNOSIS — R52 Pain, unspecified: Secondary | ICD-10-CM | POA: Diagnosis present

## 2014-05-26 DIAGNOSIS — R10819 Abdominal tenderness, unspecified site: Secondary | ICD-10-CM

## 2014-05-26 DIAGNOSIS — K529 Noninfective gastroenteritis and colitis, unspecified: Secondary | ICD-10-CM | POA: Diagnosis not present

## 2014-05-26 LAB — COMPREHENSIVE METABOLIC PANEL
ALT: 7 U/L (ref 0–35)
AST: 15 U/L (ref 0–37)
Albumin: 4.3 g/dL (ref 3.5–5.2)
Alkaline Phosphatase: 94 U/L (ref 39–117)
Anion gap: 14 (ref 5–15)
BUN: 15 mg/dL (ref 6–23)
CALCIUM: 9.7 mg/dL (ref 8.4–10.5)
CO2: 29 mEq/L (ref 19–32)
Chloride: 94 mEq/L — ABNORMAL LOW (ref 96–112)
Creatinine, Ser: 0.75 mg/dL (ref 0.50–1.10)
GFR calc Af Amer: >90 mL/min (ref >90)
Glucose, Bld: 97 mg/dL (ref 70–99)
Potassium: 3.7 mEq/L (ref 3.7–5.3)
Sodium: 137 mEq/L (ref 137–147)
Total Bilirubin: 0.4 mg/dL (ref 0.3–1.2)
Total Protein: 8.4 g/dL — ABNORMAL HIGH (ref 6.0–8.3)

## 2014-05-26 LAB — CBC WITH DIFFERENTIAL/PLATELET
BASOS ABS: 0 10*3/uL (ref 0.0–0.1)
Basophils Relative: 1 % (ref 0–1)
EOS PCT: 1 % (ref 0–5)
Eosinophils Absolute: 0 10*3/uL (ref 0.0–0.7)
HEMATOCRIT: 41.9 % (ref 36.0–46.0)
Hemoglobin: 14.4 g/dL (ref 12.0–15.0)
LYMPHS PCT: 46 % (ref 12–46)
Lymphs Abs: 2.8 10*3/uL (ref 0.7–4.0)
MCH: 32.4 pg (ref 26.0–34.0)
MCHC: 34.4 g/dL (ref 30.0–36.0)
MCV: 94.2 fL (ref 78.0–100.0)
Monocytes Absolute: 0.4 10*3/uL (ref 0.1–1.0)
Monocytes Relative: 7 % (ref 3–12)
Neutro Abs: 2.7 10*3/uL (ref 1.7–7.7)
Neutrophils Relative %: 45 % (ref 43–77)
PLATELETS: 237 10*3/uL (ref 150–400)
RBC: 4.45 MIL/uL (ref 3.87–5.11)
RDW: 13.6 % (ref 11.5–15.5)
WBC: 5.9 10*3/uL (ref 4.0–10.5)

## 2014-05-26 LAB — LIPASE, BLOOD: LIPASE: 16 U/L (ref 11–59)

## 2014-05-26 LAB — POCT RAPID STREP A: Streptococcus, Group A Screen (Direct): NEGATIVE

## 2014-05-26 LAB — POCT I-STAT, CHEM 8
BUN: 15 mg/dL (ref 6–23)
CALCIUM ION: 1.13 mmol/L (ref 1.12–1.23)
CREATININE: 0.9 mg/dL (ref 0.50–1.10)
Chloride: 97 mEq/L (ref 96–112)
Glucose, Bld: 99 mg/dL (ref 70–99)
HCT: 49 % — ABNORMAL HIGH (ref 36.0–46.0)
HEMOGLOBIN: 16.7 g/dL — AB (ref 12.0–15.0)
Potassium: 4.3 mEq/L (ref 3.7–5.3)
SODIUM: 135 meq/L — AB (ref 137–147)
TCO2: 29 mmol/L (ref 0–100)

## 2014-05-26 NOTE — ED Notes (Signed)
Pt. reports generalized body aches/ fatigue and mid abdominal pain onset 4 days ago , seen at [facility] urgent care this evening transferred here for further evaluation . Strep screen ( negative ) test done at urgent care.

## 2014-05-26 NOTE — ED Notes (Signed)
Pt is here today for body aches and fatigue that she has had for about 4 days, pt says that she also has a sore throat, pt said that she got a flu shot last week, pt also had a number of her medications decreased by her PCP last week, pts xanax, fentanyl, and opana were all decreased and pt is concerned that she may be experiencing withdrawal symptoms

## 2014-05-26 NOTE — Progress Notes (Signed)
Case discussed with Dr. Gordy Levan soon after the resident saw the patient. We reviewed the resident's history and exam and pertinent patient test results. I agree with the plan of care documented in the resident's note.  Patient apparently continuing to have issues with pain and anxiety that require intervention.  There are also some safety concerns regarding the use of controlled substances.  I agree that professional mental health and chronic pain input will be critical to optimally manage this patient's complex medical and psychological issues.  I also agree that it is clinic policy that controlled substances require a contract with the [facility] to assure safe and responsible use of these medications.

## 2014-05-26 NOTE — ED Provider Notes (Signed)
CSN: [id number]     Arrival date & time 05/26/14  1915 History   First MD Initiated Contact with Patient 05/26/14 1948     Chief Complaint  Patient presents with  . Generalized Body Aches   (Consider location/radiation/quality/duration/timing/severity/associated sxs/prior Treatment) HPI Comments: 53 year old cachectic female presenting with body aches and fatigue for 4 days. Is also complaining of a sore throat. Her strep test is negative. She notes that she has had a 40-50 now weight loss in less than a year. She has a history of COPD but is denying acute shortness of breath. She has a history of bleeding esophageal ulcer and now has abdominal pain with marked tenderness and guarding.   Past Medical History  Diagnosis Date  . Anxiety   . Depression   . Cervical cancer     Discovered by pap smear, treated by Dr. Charlesetta Garibaldi with LEEP procedure.  Pap since that time has been normal.  . Chronic back pain      her CT of the head without contrast June 28, 2008- moderate to large right paracentral disc protrusion at C4-C5 resulting in a right C5 neuroforaminal stenosis and probably some degree of spinal stenosis. C. bilateral C6 neuroforaminal stenosis related to  degenerative hypertrophy, no acute fracture or loose pieces identifying the cervical spine, ligamentous injury is not excluded.    Marland Kitchen Headache(784.0)   . COPD (chronic obstructive pulmonary disease)   . Arthritis   . GERD (gastroesophageal reflux disease)  October 2011     [facility], esophagogram - tiny amount of gastroesophageal reflux otherwise unremarkable exam, done by Dr. Dellis Filbert  . Osteoporosis     T score L femur neck = -2.6 (02/21/12)  . Fibromyalgia   . Hemorrhoids    Past Surgical History  Procedure Laterality Date  . Cervical cone biopsy      leep procedure  . Multiple extractions with alveoloplasty  06/18/2012    Procedure: MULTIPLE EXTRACION WITH ALVEOLOPLASTY;  Surgeon: Gae Bon, DDS;  Location: [facility];   Service: Oral Surgery;  Laterality: Bilateral;   Family History  Problem Relation Age of Onset  . Stroke Mother   . Diabetes Mother   . Heart disease Father   . Lung cancer Daughter    History  Substance Use Topics  . Smoking status: Current Every Day Smoker -- 1.00 packs/day for 34 years    Types: Cigarettes  . Smokeless tobacco: Never Used     Comment: hx drugs non since 95/ TRYING TO QUITS/ HAS CUT BACK  . Alcohol Use: No   OB History   Grav Para Term Preterm Abortions TAB SAB Ect Mult Living                 Review of Systems  Constitutional: Positive for activity change, appetite change and fatigue. Negative for fever.  HENT: Positive for sore throat. Negative for congestion, postnasal drip and rhinorrhea.   Respiratory: Negative for cough and shortness of breath.   Cardiovascular: Negative for chest pain.  Gastrointestinal: Positive for abdominal pain and constipation. Negative for vomiting and abdominal distention.  Genitourinary: Negative.   Musculoskeletal: Positive for back pain. Negative for neck pain.  Skin: Negative for rash.  Neurological: Positive for dizziness. Negative for seizures, syncope and speech difficulty.  Psychiatric/Behavioral: Negative for confusion and agitation.    Allergies  Aripiprazole; Chantix; Cymbalta; Pregabalin; and Zolpidem tartrate  Home Medications   Prior to Admission medications   Medication Sig Start Date End Date Taking? Authorizing Provider  albuterol (PROAIR HFA) 108 (90 BASE) MCG/ACT inhaler Inhale 1 puff into the lungs every 4 (four) hours as needed for wheezing or shortness of breath. 01/08/14   Hester Mates, MD  ALPRAZolam Duanne Moron) 1 MG tablet Take 1 tablet (1 mg total) by mouth 2 (two) times daily as needed for anxiety. This must last 30 days 05/16/14   Bartholomew Crews, MD  azithromycin (ZITHROMAX) 250 MG tablet Take 2 tablets today then 1 tablet daily for 4 more days until you finish it. 04/24/14   Blain Pais,  MD  budesonide-formoterol (SYMBICORT) 160-4.5 MCG/ACT inhaler Inhale 2 puffs into the lungs 2 (two) times daily. 01/08/14   Hester Mates, MD  esomeprazole (NEXIUM) 40 MG capsule Take 1 capsule (40 mg total) by mouth daily. 01/08/14   Hester Mates, MD  fentaNYL (DURAGESIC - DOSED MCG/HR) 75 MCG/HR Place 1 patch (75 mcg total) onto the skin every 3 (three) days. 05/21/14   Jones Bales, MD  guaiFENesin (ROBITUSSIN) 100 MG/5ML SOLN Take 5 mLs (100 mg total) by mouth every 4 (four) hours as needed for cough or to loosen phlegm. 04/24/14   Blain Pais, MD  Lactulose 20 GM/30ML SOLN Take 20 g by mouth daily as needed (for constipation).    Historical Provider, MD  megestrol (MEGACE ES) 625 MG/5ML suspension Take 625 mg by mouth daily as needed (appitite).    Historical Provider, MD  oxymorphone (OPANA) 10 MG tablet Take 1 tablet (10 mg total) by mouth daily as needed for pain. 05/21/14   Jones Bales, MD  polyethylene glycol (MIRALAX / GLYCOLAX) packet Take 17 g by mouth daily as needed for mild constipation.    Historical Provider, MD  Vitamin D, Ergocalciferol, (DRISDOL) 50000 UNITS CAPS capsule Take 1 capsule (50,000 Units total) by mouth every 7 (seven) days. 05/22/14   Jones Bales, MD   BP 122/88  Pulse 92  Temp(Src) 98.8 F (37.1 C) (Oral)  Resp 18  SpO2 95% Physical Exam  Nursing note and vitals reviewed. Constitutional: She is oriented to person, place, and time. She appears well-developed and well-nourished. No distress.  HENT:  Mouth/Throat: No oropharyngeal exudate.  Bilateral TMs are normal Oropharynx with moderate erythema, injection and dry mucous membranes.  Eyes: Conjunctivae and EOM are normal.  Neck: Normal range of motion. Neck supple.  Cardiovascular: Normal rate, regular rhythm and normal heart sounds.   Pulmonary/Chest: Effort normal. No respiratory distress.  Poor expiratory volume. Scattered coarseness. No apparent increase in effort.  Abdominal: Bowel  sounds are normal. There is tenderness. There is guarding.  Musculoskeletal: She exhibits no edema.  Lymphadenopathy:    She has no cervical adenopathy.  Neurological: She is alert and oriented to person, place, and time. She exhibits normal muscle tone.  Skin: Skin is warm and dry.  Psychiatric: She has a normal mood and affect.    ED Course  Procedures (including critical care time) Labs Review Labs Reviewed  POCT I-STAT, CHEM 8 - Abnormal; Notable for the following:    Sodium 135 (*)    Hemoglobin 16.7 (*)    HCT 49.0 (*)    All other components within normal limits  POCT RAPID STREP A ([facility] URG CARE ONLY)    Imaging Review No results found.   MDM   1. Generalized abdominal pain   2. Abdominal tenderness   3. Myalgia   4. Malaise and fatigue   5. Pulmonary emphysema, unspecified emphysema type  Patient is sent to the emergency department call for evaluation of abdominal pain and tenderness. Associated    conditions include COPD, weight loss, cachexia, opioid and benzodiazepine use and abuse with recent tapering of medications.   Janne Napoleon, NP 05/26/14 2036

## 2014-05-26 NOTE — Telephone Encounter (Signed)
Again, my recommendation is that she be seen at the [facility].  Please have her call the [facility] to schedule a follow-up appointment with them.  I explained in detail to [name] that she needs cognitive behavioral therapy and going only once or twice to the [facility] is likely to be of little benefit to her.  She needs to be going on a regular basis.

## 2014-05-26 NOTE — Telephone Encounter (Signed)
Pt calls and states she has been sick since her last visit, she states she does not know if the flu shot caused it or if it is where her anxiety medicine has "been cut so drastically", she states she aches all over and has N&V, her legs feel jumpy and she feels like she is going to have a panic attack. She has an appt wed but states she cannot go on like this, she is advised that she may go to urg care but to keep her appt wed

## 2014-05-27 ENCOUNTER — Emergency Department (HOSPITAL_COMMUNITY)
Admission: EM | Admit: 2014-05-27 | Discharge: 2014-05-27 | Discharge status: Against Medical Advice | Payer: PRIVATE HEALTH INSURANCE | Attending: Emergency Medicine | Admitting: Emergency Medicine

## 2014-05-27 ENCOUNTER — Encounter (HOSPITAL_COMMUNITY): Payer: Self-pay | Admitting: Emergency Medicine

## 2014-05-27 ENCOUNTER — Other Ambulatory Visit: Payer: Self-pay | Admitting: Internal Medicine

## 2014-05-27 ENCOUNTER — Emergency Department (HOSPITAL_COMMUNITY): Payer: PRIVATE HEALTH INSURANCE

## 2014-05-27 ENCOUNTER — Emergency Department (HOSPITAL_COMMUNITY)
Admission: EM | Admit: 2014-05-27 | Discharge: 2014-05-27 | Disposition: A | Discharge status: Home / Self Care | Payer: PRIVATE HEALTH INSURANCE | Attending: Emergency Medicine | Admitting: Emergency Medicine

## 2014-05-27 DIAGNOSIS — Z720 Tobacco use: Secondary | ICD-10-CM | POA: Insufficient documentation

## 2014-05-27 DIAGNOSIS — Z7951 Long term (current) use of inhaled steroids: Secondary | ICD-10-CM | POA: Insufficient documentation

## 2014-05-27 DIAGNOSIS — R52 Pain, unspecified: Secondary | ICD-10-CM

## 2014-05-27 DIAGNOSIS — R634 Abnormal weight loss: Secondary | ICD-10-CM | POA: Diagnosis not present

## 2014-05-27 DIAGNOSIS — K219 Gastro-esophageal reflux disease without esophagitis: Secondary | ICD-10-CM | POA: Diagnosis not present

## 2014-05-27 DIAGNOSIS — J449 Chronic obstructive pulmonary disease, unspecified: Secondary | ICD-10-CM | POA: Insufficient documentation

## 2014-05-27 DIAGNOSIS — K529 Noninfective gastroenteritis and colitis, unspecified: Secondary | ICD-10-CM | POA: Diagnosis not present

## 2014-05-27 DIAGNOSIS — G8929 Other chronic pain: Secondary | ICD-10-CM | POA: Diagnosis not present

## 2014-05-27 DIAGNOSIS — Z8659 Personal history of other mental and behavioral disorders: Secondary | ICD-10-CM | POA: Diagnosis not present

## 2014-05-27 DIAGNOSIS — Z8541 Personal history of malignant neoplasm of cervix uteri: Secondary | ICD-10-CM | POA: Insufficient documentation

## 2014-05-27 DIAGNOSIS — M1990 Unspecified osteoarthritis, unspecified site: Secondary | ICD-10-CM | POA: Diagnosis not present

## 2014-05-27 DIAGNOSIS — R51 Headache: Secondary | ICD-10-CM | POA: Diagnosis not present

## 2014-05-27 DIAGNOSIS — Z79899 Other long term (current) drug therapy: Secondary | ICD-10-CM | POA: Insufficient documentation

## 2014-05-27 DIAGNOSIS — R1011 Right upper quadrant pain: Secondary | ICD-10-CM | POA: Diagnosis present

## 2014-05-27 LAB — COMPREHENSIVE METABOLIC PANEL
ALBUMIN: 3.7 g/dL (ref 3.5–5.2)
ALT: 6 U/L (ref 0–35)
AST: 14 U/L (ref 0–37)
Alkaline Phosphatase: 82 U/L (ref 39–117)
Anion gap: 14 (ref 5–15)
BUN: 11 mg/dL (ref 6–23)
CO2: 26 mEq/L (ref 19–32)
Calcium: 9.2 mg/dL (ref 8.4–10.5)
Chloride: 97 mEq/L (ref 96–112)
Creatinine, Ser: 0.59 mg/dL (ref 0.50–1.10)
GFR calc Af Amer: >90 mL/min (ref >90)
GFR calc non Af Amer: >90 mL/min (ref >90)
Glucose, Bld: 85 mg/dL (ref 70–99)
POTASSIUM: 4.1 meq/L (ref 3.7–5.3)
Sodium: 137 mEq/L (ref 137–147)
TOTAL PROTEIN: 7.4 g/dL (ref 6.0–8.3)
Total Bilirubin: 0.4 mg/dL (ref 0.3–1.2)

## 2014-05-27 LAB — CBC WITH DIFFERENTIAL/PLATELET
BASOS ABS: 0 10*3/uL (ref 0.0–0.1)
BASOS PCT: 0 % (ref 0–1)
Eosinophils Absolute: 0.1 10*3/uL (ref 0.0–0.7)
Eosinophils Relative: 2 % (ref 0–5)
HEMATOCRIT: 39.5 % (ref 36.0–46.0)
Hemoglobin: 13.3 g/dL (ref 12.0–15.0)
LYMPHS PCT: 48 % — AB (ref 12–46)
Lymphs Abs: 1.9 10*3/uL (ref 0.7–4.0)
MCH: 31.8 pg (ref 26.0–34.0)
MCHC: 33.7 g/dL (ref 30.0–36.0)
MCV: 94.5 fL (ref 78.0–100.0)
MONO ABS: 0.3 10*3/uL (ref 0.1–1.0)
Monocytes Relative: 8 % (ref 3–12)
Neutro Abs: 1.6 10*3/uL — ABNORMAL LOW (ref 1.7–7.7)
Neutrophils Relative %: 42 % — ABNORMAL LOW (ref 43–77)
PLATELETS: 204 10*3/uL (ref 150–400)
RBC: 4.18 MIL/uL (ref 3.87–5.11)
RDW: 13.6 % (ref 11.5–15.5)
WBC: 3.8 10*3/uL — AB (ref 4.0–10.5)

## 2014-05-27 LAB — URINE MICROSCOPIC-ADD ON

## 2014-05-27 LAB — URINALYSIS, ROUTINE W REFLEX MICROSCOPIC
Bilirubin Urine: NEGATIVE
GLUCOSE, UA: NEGATIVE mg/dL
Hgb urine dipstick: NEGATIVE
Ketones, ur: NEGATIVE mg/dL
NITRITE: NEGATIVE
PH: 6 (ref 5.0–8.0)
Protein, ur: NEGATIVE mg/dL
SPECIFIC GRAVITY, URINE: 1.011 (ref 1.005–1.030)
Urobilinogen, UA: 1 mg/dL (ref 0.0–1.0)

## 2014-05-27 LAB — LIPASE, BLOOD: LIPASE: 13 U/L (ref 11–59)

## 2014-05-27 MED ORDER — CIPROFLOXACIN HCL 500 MG PO TABS: 500.0000 mg | ORAL_TABLET | Freq: Two times a day (BID) | ORAL | Status: DC

## 2014-05-27 MED ORDER — METRONIDAZOLE 500 MG PO TABS: 500.0000 mg | ORAL_TABLET | Freq: Two times a day (BID) | ORAL | Status: DC

## 2014-05-27 MED ORDER — MORPHINE SULFATE 4 MG/ML IJ SOLN
4.0000 mg | Freq: Once | INTRAMUSCULAR | Status: AC
  Administered 2014-05-27: 4 mg via INTRAVENOUS
  Filled 2014-05-27: qty 1

## 2014-05-27 MED ORDER — SODIUM CHLORIDE 0.9 % IV SOLN
INTRAVENOUS | Status: DC
  Administered 2014-05-27: 13:00:00 via INTRAVENOUS

## 2014-05-27 MED ORDER — IOHEXOL 300 MG/ML  SOLN
25.0000 mL | INTRAMUSCULAR | Status: AC
Start: 2014-05-27 — End: 2014-05-27
  Administered 2014-05-27 (×2): 25 mL via ORAL

## 2014-05-27 MED ORDER — HYDROCODONE-ACETAMINOPHEN 5-325 MG PO TABS
1.0000 | ORAL_TABLET | ORAL | Status: DC | PRN
Start: 2014-05-27 — End: 2014-05-30

## 2014-05-27 MED ORDER — IOHEXOL 300 MG/ML  SOLN
80.0000 mL | Freq: Once | INTRAMUSCULAR | Status: AC | PRN
Start: 2014-05-27 — End: 2014-05-27
  Administered 2014-05-27: 70 mL via INTRAVENOUS

## 2014-05-27 MED ORDER — SODIUM CHLORIDE 0.9 % IV BOLUS (SEPSIS)
500.0000 mL | Freq: Once | INTRAVENOUS | Status: AC
  Administered 2014-05-27: 13:00:00 via INTRAVENOUS

## 2014-05-27 MED ORDER — ONDANSETRON HCL 4 MG/2ML IJ SOLN
4.0000 mg | Freq: Once | INTRAMUSCULAR | Status: AC
  Administered 2014-05-27: 4 mg via INTRAVENOUS
  Filled 2014-05-27: qty 2

## 2014-05-27 MED ORDER — HYDROCODONE-ACETAMINOPHEN 5-325 MG PO TABS
1.0000 | ORAL_TABLET | Freq: Once | ORAL | Status: AC
  Administered 2014-05-27: 1 via ORAL
  Filled 2014-05-27: qty 1

## 2014-05-27 NOTE — ED Notes (Signed)
Pt presents with 5 day h/o abdominal pain and generalized body aches.  Pt reports episode of constipation last week, took laxative and enema that relieved symptoms.  Pt reports at that time her stool was black, but is normal now.  Pt reports nausea, denies any vomiting. Pt seen at Urgent Care and referred here.  Pt reports 30 pound weight  Loss x 1 year.

## 2014-05-27 NOTE — ED Notes (Signed)
Patient transported to CT 

## 2014-05-27 NOTE — ED Provider Notes (Signed)
Medical screening examination/treatment/procedure(s) were performed by resident physician or non-physician practitioner and as supervising physician I was immediately available for consultation/collaboration.   Pauline Good MD.   Billy Fischer, MD 05/27/14 [phone]

## 2014-05-27 NOTE — Discharge Instructions (Signed)
Abdominal Pain Many things can cause belly (abdominal) pain. Most times, the belly pain is not dangerous. Many cases of belly pain can be watched and treated at home. HOME CARE   Do not take medicines that help you go poop (laxatives) unless told to by your doctor.  Only take medicine as told by your doctor.  Eat or drink as told by your doctor. Your doctor will tell you if you should be on a special diet. GET HELP IF:  You do not know what is causing your belly pain.  You have belly pain while you are sick to your stomach (nauseous) or have runny poop (diarrhea).  You have pain while you pee or poop.  Your belly pain wakes you up at night.  You have belly pain that gets worse or better when you eat.  You have belly pain that gets worse when you eat fatty foods.  You have a fever. GET HELP RIGHT AWAY IF:   The pain does not go away within 2 hours.  You keep throwing up (vomiting).  The pain changes and is only in the right or left part of the belly.  You have bloody or tarry looking poop. MAKE SURE YOU:   Understand these instructions.  Will watch your condition.  Will get help right away if you are not doing well or get worse. Document Released: 01/04/2008 Document Revised: 07/23/2013 Document Reviewed: 03/27/2013 [facility] Patient Information 2015 [place], [place]. This information is not intended to replace advice given to you by your health care provider. Make sure you discuss any questions you have with your health care provider.  Colitis Colitis is inflammation of the colon. Colitis can be a short-term or long-standing (chronic) illness. Crohn's disease and ulcerative colitis are 2 types of colitis which are chronic. They usually require lifelong treatment. CAUSES  There are many different causes of colitis, including:  Viruses.  Germs (bacteria).  Medicine reactions. SYMPTOMS   Diarrhea.  Intestinal bleeding.  Pain.  Fever.  Throwing up  (vomiting).  Tiredness (fatigue).  Weight loss.  Bowel blockage. DIAGNOSIS  The diagnosis of colitis is based on examination and stool or blood tests. X-rays, CT scan, and colonoscopy may also be needed. TREATMENT  Treatment may include:  Fluids given through the vein (intravenously).  Bowel rest (nothing to eat or drink for a period of time).  Medicine for pain and diarrhea.  Medicines (antibiotics) that kill germs.  Cortisone medicines.  Surgery. HOME CARE INSTRUCTIONS   Get plenty of rest.  Drink enough water and fluids to keep your urine clear or pale yellow.  Eat a well-balanced diet.  Call your caregiver for follow-up as recommended. SEEK IMMEDIATE MEDICAL CARE IF:   You develop chills.  You have an oral temperature above 102 F (38.9 C), not controlled by medicine.  You have extreme weakness, fainting, or dehydration.  You have repeated vomiting.  You develop severe belly (abdominal) pain or are passing bloody or tarry stools. MAKE SURE YOU:   Understand these instructions.  Will watch your condition.  Will get help right away if you are not doing well or get worse. Document Released: 08/25/2004 Document Revised: 10/10/2011 Document Reviewed: 11/20/2009 [facility] Patient Information 2015 [place], [place]. This information is not intended to replace advice given to you by your health care provider. Make sure you discuss any questions you have with your health care provider.

## 2014-05-27 NOTE — ED Provider Notes (Signed)
CSN: [id number]     Arrival date & time 05/27/14  1035 History   First MD Initiated Contact with Patient 05/27/14 1115     No chief complaint on file.    (Consider location/radiation/quality/duration/timing/severity/associated sxs/prior Treatment) HPI  [name] is a 53 y.o. female who presents for evaluation of abdominal pain and achiness, sore 5 days. She was constipated yesterday, but used an enema with improvement. She had a single episode of dark stool, but now has normal colored stool. She has had decreased oral intake because of poor appetite, but has not vomited. She denies fever. She reports a 30-40 pound weight loss over the last 1 year, which has been unintentional. She saw her primary care doctor about a week ago and had some medications changed, and got a flu  Immunization. She has a history of pancreatitis. She does not drink alcohol. She smokes cigarettes. She was seen at the urgent care, yesterday, evaluated and recommended to follow-up in the emergency department, but decided to go home instead. Her discomfort persisted so she came here for evaluation today. There are no other known modifying factors.   Past Medical History  Diagnosis Date  . Anxiety   . Depression   . Cervical cancer     Discovered by pap smear, treated by Dr. Charlesetta Garibaldi with LEEP procedure.  Pap since that time has been normal.  . Chronic back pain      her CT of the head without contrast June 28, 2008- moderate to large right paracentral disc protrusion at C4-C5 resulting in a right C5 neuroforaminal stenosis and probably some degree of spinal stenosis. C. bilateral C6 neuroforaminal stenosis related to  degenerative hypertrophy, no acute fracture or loose pieces identifying the cervical spine, ligamentous injury is not excluded.    Marland Kitchen Headache(784.0)   . COPD (chronic obstructive pulmonary disease)   . Arthritis   . GERD (gastroesophageal reflux disease)  October 2011     Williams, esophagogram -  tiny amount of gastroesophageal reflux otherwise unremarkable exam, done by Dr. Dellis Filbert  . Osteoporosis     T score L femur neck = -2.6 (02/21/12)  . Fibromyalgia   . Hemorrhoids    Past Surgical History  Procedure Laterality Date  . Cervical cone biopsy      leep procedure  . Multiple extractions with alveoloplasty  06/18/2012    Procedure: MULTIPLE EXTRACION WITH ALVEOLOPLASTY;  Surgeon: Gae Bon, DDS;  Location: [place];  Service: Oral Surgery;  Laterality: Bilateral;   Family History  Problem Relation Age of Onset  . Stroke Mother   . Diabetes Mother   . Heart disease Father   . Lung cancer Daughter    History  Substance Use Topics  . Smoking status: Current Every Day Smoker -- 1.00 packs/day for 34 years    Types: Cigarettes  . Smokeless tobacco: Never Used     Comment: hx drugs non since 95/ TRYING TO QUITS/ HAS CUT BACK  . Alcohol Use: No   OB History   Grav Para Term Preterm Abortions TAB SAB Ect Mult Living                 Review of Systems  All other systems reviewed and are negative.     Allergies  Aripiprazole; Chantix; Cymbalta; Pregabalin; and Zolpidem tartrate  Home Medications   Prior to Admission medications   Medication Sig Start Date End Date Taking? Authorizing Provider  albuterol (PROAIR HFA) 108 (90 BASE) MCG/ACT inhaler Inhale  1 puff into the lungs every 4 (four) hours as needed for wheezing or shortness of breath. 01/08/14  Yes Hester Mates, MD  ALPRAZolam Duanne Moron) 1 MG tablet Take 1 tablet (1 mg total) by mouth 2 (two) times daily as needed for anxiety. This must last 30 days 05/16/14  Yes Bartholomew Crews, MD  budesonide-formoterol [facility]) 160-4.5 MCG/ACT inhaler Inhale 2 puffs into the lungs 2 (two) times daily. 01/08/14  Yes Hester Mates, MD  esomeprazole (NEXIUM) 40 MG capsule Take 1 capsule (40 mg total) by mouth daily. 01/08/14  Yes Hester Mates, MD  fentaNYL (DURAGESIC - DOSED MCG/HR) 75 MCG/HR Place 75 mcg onto the skin every 3  (three) days. 05/21/14  Yes Jones Bales, MD  oxymorphone (OPANA) 10 MG tablet Take 10 mg by mouth daily. 05/21/14  Yes Jones Bales, MD  polyethylene glycol (MIRALAX / GLYCOLAX) packet Take 17 g by mouth daily as needed for mild constipation.   Yes Historical Provider, MD  Promethazine HCl (PHENERGAN PO) Take 1 tablet by mouth every 8 (eight) hours as needed (nausea).   Yes Historical Provider, MD  ciprofloxacin (CIPRO) 500 MG tablet Take 1 tablet (500 mg total) by mouth 2 (two) times daily. One po bid x 7 days 05/27/14   Richarda Blade, MD  HYDROcodone-acetaminophen (NORCO) 5-325 MG per tablet Take 1 tablet by mouth every 4 (four) hours as needed. 05/27/14   Richarda Blade, MD  metroNIDAZOLE (FLAGYL) 500 MG tablet Take 1 tablet (500 mg total) by mouth 2 (two) times daily. One po bid x 7 days 05/27/14   Richarda Blade, MD   BP 111/72  Pulse 62  Temp(Src) 98.7 F (37.1 C) (Oral)  Resp 16  Ht 5\' 7"  (1.702 m)  Wt 98 lb (44.453 kg)  BMI 15.35 kg/m2  SpO2 96% Physical Exam  Nursing note and vitals reviewed. Constitutional: She is oriented to person, place, and time. She appears well-developed.  She is frail. She appears much older than stated age.  HENT:  Head: Normocephalic and atraumatic.  Eyes: Conjunctivae and EOM are normal. Pupils are equal, round, and reactive to light.  Neck: Normal range of motion and phonation normal. Neck supple.  Cardiovascular: Normal rate and regular rhythm.   Pulmonary/Chest: Effort normal and breath sounds normal. She exhibits no tenderness.  Abdominal: Soft. She exhibits no distension and no mass. There is tenderness (moderate right upper quadrants, moderate.). There is no rebound and no guarding.  Musculoskeletal: Normal range of motion.  Neurological: She is alert and oriented to person, place, and time. She exhibits normal muscle tone.  Skin: Skin is warm and dry.  Psychiatric: She has a normal mood and affect. Her behavior is normal.  Judgment and thought content normal.    ED Course  Procedures (including critical care time) Medications  sodium chloride 0.9 % bolus 500 mL (0 mLs Intravenous Stopped 05/27/14 1321)  morphine 4 MG/ML injection 4 mg (4 mg Intravenous Given 05/27/14 1231)  ondansetron (ZOFRAN) injection 4 mg (4 mg Intravenous Given 05/27/14 1231)  iohexol (OMNIPAQUE) 300 MG/ML solution 25 mL (25 mLs Oral Contrast Given 05/27/14 1315)  iohexol (OMNIPAQUE) 300 MG/ML solution 80 mL (70 mLs Intravenous Contrast Given 05/27/14 1519)  HYDROcodone-acetaminophen (NORCO/VICODIN) 5-325 MG per tablet 1 tablet (1 tablet Oral Given 05/27/14 1850)    Patient Vitals for the past 24 hrs:  BP Temp Temp src Pulse Resp SpO2  05/27/14 1900 111/72 mmHg - - 62 - 96 %  05/27/14 1830 129/75 mmHg - - 64 - 95 %  05/27/14 1800 110/68 mmHg - - 66 - 95 %  05/27/14 1730 117/77 mmHg - - 65 - 96 %  05/27/14 1717 114/73 mmHg 98.7 F (37.1 C) Oral 66 16 96 %    At D/C Reevaluation with update and discussion. After initial assessment and treatment, an updated evaluation reveals more comfortable. Findings discussed with pt all questions answered. Stephaney Steven L     Labs Review Labs Reviewed  CBC WITH DIFFERENTIAL - Abnormal; Notable for the following:    WBC 3.8 (*)    Neutrophils Relative % 42 (*)    Neutro Abs 1.6 (*)    Lymphocytes Relative 48 (*)    All other components within normal limits  URINALYSIS, ROUTINE W REFLEX MICROSCOPIC - Abnormal; Notable for the following:    Leukocytes, UA TRACE (*)    All other components within normal limits  URINE CULTURE  LIPASE, BLOOD  COMPREHENSIVE METABOLIC PANEL  URINE MICROSCOPIC-ADD ON    Imaging Review Ct Abdomen Pelvis W Contrast  05/27/2014   CLINICAL DATA:  53 year old female with 5 day history of abdominal pain and generalized body aches. Episode of constipation 1 week ago. Weight loss. History of cervical cancer. Initial encounter.  EXAM: CT ABDOMEN AND PELVIS WITH  CONTRAST  TECHNIQUE: Multidetector CT imaging of the abdomen and pelvis was performed using the standard protocol following bolus administration of intravenous contrast.  CONTRAST:  79mL OMNIPAQUE IOHEXOL 300 MG/ML  SOLN  COMPARISON:  02/22/2014 and 01/18/2008 CT.  FINDINGS: Portions of the mid to distal sigmoid colon are under distended with appearance of mild bowel wall thickening with minimal haziness of surrounding fat planes which may represent changes of colitis. No free intraperitoneal air. No abnormal fluid collection.  Calcification left lobe liver. Intrahepatic biliary ducts top-normal in size. No worrisome hepatic lesion. No splenic, pancreatic, adrenal or renal abnormality noted. No calcified gallstones.  Retroperitoneal shotty lymph nodes (which have increased in size since 2009 as previously described) are stable from the most recent CT. With the patient's history of cervical cancer, stability of these lymph nodes can be assessed on follow up.  Atherosclerotic type changes of the aorta with moderate narrowing lower abdominal aorta. Atherosclerotic type changes common iliac arteries with mild to slightly moderate narrowing. Moderate narrowing origin of the celiac artery.  Visualized lung bases clear.  Heart size within normal limits.  Prominent vessels extend to the left aspect of the uterus/adnexae without significant change of questionable etiology.  Noncontrast filled imaging of the urinary bladder unremarkable. Calcification adjacent to left bladder wall appears outside the bladder.  No osseous destructive lesion.  IMPRESSION: Portions of the mid to distal sigmoid colon are under distended with appearance of mild bowel wall thickening with minimal haziness of surrounding fat planes which may represent changes of colitis. No free intraperitoneal air. No abnormal fluid collection.  Intrahepatic biliary ducts top-normal in size.  Retroperitoneal shotty lymph nodes (which have increased in size since  2009 as previously described) are stable from the most recent CT. With the patient's history of cervical cancer, stability of these lymph nodes can be assessed on follow up.  Atherosclerotic type changes of the aorta with moderate narrowing lower abdominal aorta. Atherosclerotic type changes common iliac arteries with mild to slightly moderate narrowing. Moderate narrowing origin of the celiac artery.  Prominent vessels extend to the left aspect of the uterus/adnexae without significant change of questionable etiology.   Electronically Signed  By: Chauncey Cruel M.D.   On: 05/27/2014 16:13     EKG Interpretation None       MDM   Final diagnoses:  Pain  Weight loss  Colitis    Nonspecific ongoing sx with reassuring evaluation in ED. Possible mild colitis. No acute severe infections, metabolic instability or apparent oncologic abnormalities. Stable to f/u with PCP for OP eval. And treatment.  Nursing Notes Reviewed/ Care Coordinated Applicable Imaging Reviewed Interpretation of Laboratory Data incorporated into ED treatment  The patient appears reasonably screened and/or stabilized for discharge and I doubt any other medical condition or other [facility] requiring further screening, evaluation, or treatment in the ED at this time prior to discharge.  Plan: Home Medications- Flagyl, Cipro, Norco; Home Treatments- rest; return here if the recommended treatment, does not improve the symptoms; Recommended follow up- PCP 1 week     Richarda Blade, MD 05/28/14 1136

## 2014-05-28 ENCOUNTER — Other Ambulatory Visit: Payer: Self-pay | Admitting: Internal Medicine

## 2014-05-28 ENCOUNTER — Ambulatory Visit: Payer: PRIVATE HEALTH INSURANCE | Admitting: Internal Medicine

## 2014-05-28 LAB — URINE CULTURE

## 2014-05-28 LAB — CULTURE, GROUP A STREP

## 2014-05-28 NOTE — Telephone Encounter (Signed)
Pt saw Dr Gordy Levan 05/21/14 at [facility].

## 2014-05-29 ENCOUNTER — Emergency Department (HOSPITAL_COMMUNITY): Payer: PRIVATE HEALTH INSURANCE

## 2014-05-29 ENCOUNTER — Observation Stay (HOSPITAL_COMMUNITY)
Admission: EM | Admit: 2014-05-29 | Discharge: 2014-05-30 | Disposition: A | Discharge status: Home / Self Care | Payer: PRIVATE HEALTH INSURANCE | Attending: Internal Medicine | Admitting: Internal Medicine

## 2014-05-29 DIAGNOSIS — E43 Unspecified severe protein-calorie malnutrition: Secondary | ICD-10-CM | POA: Insufficient documentation

## 2014-05-29 DIAGNOSIS — G8929 Other chronic pain: Secondary | ICD-10-CM | POA: Insufficient documentation

## 2014-05-29 DIAGNOSIS — F329 Major depressive disorder, single episode, unspecified: Secondary | ICD-10-CM | POA: Diagnosis not present

## 2014-05-29 DIAGNOSIS — R0602 Shortness of breath: Secondary | ICD-10-CM

## 2014-05-29 DIAGNOSIS — R0789 Other chest pain: Secondary | ICD-10-CM

## 2014-05-29 DIAGNOSIS — R079 Chest pain, unspecified: Secondary | ICD-10-CM | POA: Diagnosis not present

## 2014-05-29 DIAGNOSIS — F1120 Opioid dependence, uncomplicated: Secondary | ICD-10-CM | POA: Diagnosis present

## 2014-05-29 DIAGNOSIS — Z8541 Personal history of malignant neoplasm of cervix uteri: Secondary | ICD-10-CM | POA: Diagnosis not present

## 2014-05-29 DIAGNOSIS — K649 Unspecified hemorrhoids: Secondary | ICD-10-CM | POA: Diagnosis not present

## 2014-05-29 DIAGNOSIS — K219 Gastro-esophageal reflux disease without esophagitis: Secondary | ICD-10-CM | POA: Insufficient documentation

## 2014-05-29 DIAGNOSIS — J441 Chronic obstructive pulmonary disease with (acute) exacerbation: Principal | ICD-10-CM | POA: Insufficient documentation

## 2014-05-29 DIAGNOSIS — Z720 Tobacco use: Secondary | ICD-10-CM | POA: Insufficient documentation

## 2014-05-29 DIAGNOSIS — F419 Anxiety disorder, unspecified: Secondary | ICD-10-CM | POA: Diagnosis not present

## 2014-05-29 DIAGNOSIS — M1990 Unspecified osteoarthritis, unspecified site: Secondary | ICD-10-CM | POA: Diagnosis not present

## 2014-05-29 DIAGNOSIS — M810 Age-related osteoporosis without current pathological fracture: Secondary | ICD-10-CM | POA: Diagnosis not present

## 2014-05-29 DIAGNOSIS — Z79899 Other long term (current) drug therapy: Secondary | ICD-10-CM | POA: Insufficient documentation

## 2014-05-29 DIAGNOSIS — F1920 Other psychoactive substance dependence, uncomplicated: Secondary | ICD-10-CM

## 2014-05-29 DIAGNOSIS — R109 Unspecified abdominal pain: Secondary | ICD-10-CM | POA: Diagnosis present

## 2014-05-29 LAB — CBC WITH DIFFERENTIAL/PLATELET
BASOS ABS: 0 10*3/uL (ref 0.0–0.1)
Basophils Relative: 0 % (ref 0–1)
EOS PCT: 0 % (ref 0–5)
Eosinophils Absolute: 0 10*3/uL (ref 0.0–0.7)
HCT: 38.8 % (ref 36.0–46.0)
HEMOGLOBIN: 13.1 g/dL (ref 12.0–15.0)
Lymphocytes Relative: 15 % (ref 12–46)
Lymphs Abs: 0.4 10*3/uL — ABNORMAL LOW (ref 0.7–4.0)
MCH: 32.1 pg (ref 26.0–34.0)
MCHC: 33.8 g/dL (ref 30.0–36.0)
MCV: 95.1 fL (ref 78.0–100.0)
Monocytes Absolute: 0 10*3/uL — ABNORMAL LOW (ref 0.1–1.0)
Monocytes Relative: 1 % — ABNORMAL LOW (ref 3–12)
Neutro Abs: 2.3 10*3/uL (ref 1.7–7.7)
Neutrophils Relative %: 84 % — ABNORMAL HIGH (ref 43–77)
Platelets: 178 10*3/uL (ref 150–400)
RBC: 4.08 MIL/uL (ref 3.87–5.11)
RDW: 13.5 % (ref 11.5–15.5)
WBC: 2.8 10*3/uL — ABNORMAL LOW (ref 4.0–10.5)

## 2014-05-29 LAB — ACETAMINOPHEN LEVEL: Acetaminophen (Tylenol), Serum: <15 ug/mL (ref 10–30)

## 2014-05-29 LAB — COMPREHENSIVE METABOLIC PANEL
ALT: 6 U/L (ref 0–35)
ANION GAP: 13 (ref 5–15)
AST: 13 U/L (ref 0–37)
Albumin: 3.6 g/dL (ref 3.5–5.2)
Alkaline Phosphatase: 77 U/L (ref 39–117)
BILIRUBIN TOTAL: 0.4 mg/dL (ref 0.3–1.2)
BUN: 6 mg/dL (ref 6–23)
CALCIUM: 8.9 mg/dL (ref 8.4–10.5)
CHLORIDE: 101 meq/L (ref 96–112)
CO2: 27 mEq/L (ref 19–32)
CREATININE: 0.66 mg/dL (ref 0.50–1.10)
GLUCOSE: 128 mg/dL — AB (ref 70–99)
Potassium: 3 mEq/L — ABNORMAL LOW (ref 3.7–5.3)
Sodium: 141 mEq/L (ref 137–147)
Total Protein: 6.8 g/dL (ref 6.0–8.3)

## 2014-05-29 LAB — RAPID URINE DRUG SCREEN, HOSP PERFORMED
AMPHETAMINES: NOT DETECTED
Barbiturates: NOT DETECTED
Benzodiazepines: NOT DETECTED
Cocaine: NOT DETECTED
OPIATES: POSITIVE — AB
TETRAHYDROCANNABINOL: NOT DETECTED

## 2014-05-29 LAB — I-STAT TROPONIN, ED: Troponin i, poc: 0.01 ng/mL (ref 0.00–0.08)

## 2014-05-29 LAB — I-STAT ARTERIAL BLOOD GAS, ED
Bicarbonate: 25.1 mEq/L — ABNORMAL HIGH (ref 20.0–24.0)
O2 SAT: 92 %
PO2 ART: 65 mmHg — AB (ref 80.0–100.0)
Patient temperature: 98.8
TCO2: 26 mmol/L (ref 0–100)
pCO2 arterial: 41.3 mmHg (ref 35.0–45.0)
pH, Arterial: 7.392 (ref 7.350–7.450)

## 2014-05-29 LAB — SALICYLATE LEVEL

## 2014-05-29 LAB — CBG MONITORING, ED: Glucose-Capillary: 178 mg/dL — ABNORMAL HIGH (ref 70–99)

## 2014-05-29 LAB — D-DIMER, QUANTITATIVE: D-Dimer, Quant: 0.36 ug/mL-FEU (ref 0.00–0.48)

## 2014-05-29 LAB — I-STAT CG4 LACTIC ACID, ED: Lactic Acid, Venous: 2.29 mmol/L — ABNORMAL HIGH (ref 0.5–2.2)

## 2014-05-29 LAB — TROPONIN I: Troponin I: <0.3 ng/mL (ref <0.30)

## 2014-05-29 MED ORDER — PANTOPRAZOLE SODIUM 40 MG PO TBEC
80.0000 mg | DELAYED_RELEASE_TABLET | Freq: Every day | ORAL | Status: DC
  Administered 2014-05-30: 80 mg via ORAL
  Filled 2014-05-29: qty 2

## 2014-05-29 MED ORDER — POLYETHYLENE GLYCOL 3350 17 G PO PACK: 17.0000 g | PACK | Freq: Every day | ORAL | Status: DC | PRN

## 2014-05-29 MED ORDER — MORPHINE SULFATE 15 MG PO TABS
30.0000 mg | ORAL_TABLET | Freq: Every day | ORAL | Status: DC
  Administered 2014-05-30: 30 mg via ORAL
  Filled 2014-05-29: qty 2

## 2014-05-29 MED ORDER — ENOXAPARIN SODIUM 30 MG/0.3ML ~~LOC~~ SOLN
30.0000 mg | SUBCUTANEOUS | Status: DC
  Administered 2014-05-29: 30 mg via SUBCUTANEOUS
  Filled 2014-05-29: qty 0.3

## 2014-05-29 MED ORDER — SODIUM CHLORIDE 0.9 % IV BOLUS (SEPSIS)
1000.0000 mL | Freq: Once | INTRAVENOUS | Status: AC
  Administered 2014-05-29: 1000 mL via INTRAVENOUS

## 2014-05-29 MED ORDER — SODIUM CHLORIDE 0.9 % IV SOLN
INTRAVENOUS | Status: DC
  Administered 2014-05-29: via INTRAVENOUS

## 2014-05-29 MED ORDER — IPRATROPIUM-ALBUTEROL 0.5-2.5 (3) MG/3ML IN SOLN
3.0000 mL | RESPIRATORY_TRACT | Status: DC | PRN
  Administered 2014-05-30: 3 mL via RESPIRATORY_TRACT
  Filled 2014-05-29: qty 3

## 2014-05-29 MED ORDER — IPRATROPIUM-ALBUTEROL 0.5-2.5 (3) MG/3ML IN SOLN
3.0000 mL | Freq: Once | RESPIRATORY_TRACT | Status: AC
  Administered 2014-05-29: 3 mL via RESPIRATORY_TRACT
  Filled 2014-05-29: qty 3

## 2014-05-29 MED ORDER — METRONIDAZOLE 500 MG PO TABS
500.0000 mg | ORAL_TABLET | Freq: Two times a day (BID) | ORAL | Status: DC
  Administered 2014-05-29 – 2014-05-30 (×2): 500 mg via ORAL
  Filled 2014-05-29 (×2): qty 1

## 2014-05-29 MED ORDER — METHYLPREDNISOLONE SODIUM SUCC 125 MG IJ SOLR: 125.0000 mg | Freq: Once | INTRAMUSCULAR | Status: DC

## 2014-05-29 MED ORDER — ALPRAZOLAM 0.5 MG PO TABS
1.0000 mg | ORAL_TABLET | Freq: Two times a day (BID) | ORAL | Status: DC | PRN
  Administered 2014-05-29: 1 mg via ORAL
  Filled 2014-05-29: qty 4

## 2014-05-29 MED ORDER — SODIUM CHLORIDE 0.9 % IJ SOLN: 3.0000 mL | Freq: Two times a day (BID) | INTRAMUSCULAR | Status: DC

## 2014-05-29 MED ORDER — CIPROFLOXACIN HCL 500 MG PO TABS
500.0000 mg | ORAL_TABLET | Freq: Two times a day (BID) | ORAL | Status: DC
  Administered 2014-05-29 – 2014-05-30 (×2): 500 mg via ORAL
  Filled 2014-05-29 (×2): qty 1

## 2014-05-29 MED ORDER — POTASSIUM CHLORIDE CRYS ER 20 MEQ PO TBCR
40.0000 meq | EXTENDED_RELEASE_TABLET | Freq: Once | ORAL | Status: AC
  Administered 2014-05-29: 40 meq via ORAL
  Filled 2014-05-29: qty 2

## 2014-05-29 MED ORDER — BUDESONIDE-FORMOTEROL FUMARATE 160-4.5 MCG/ACT IN AERO
2.0000 | INHALATION_SPRAY | Freq: Two times a day (BID) | RESPIRATORY_TRACT | Status: DC
  Administered 2014-05-29 – 2014-05-30 (×2): 2 via RESPIRATORY_TRACT
  Filled 2014-05-29: qty 6

## 2014-05-29 MED ORDER — FENTANYL 25 MCG/HR TD PT72
75.0000 ug | MEDICATED_PATCH | TRANSDERMAL | Status: DC
  Administered 2014-05-30: 75 ug via TRANSDERMAL
  Filled 2014-05-29: qty 3

## 2014-05-29 MED ORDER — ACETAMINOPHEN 325 MG PO TABS
650.0000 mg | ORAL_TABLET | Freq: Four times a day (QID) | ORAL | Status: DC | PRN
  Administered 2014-05-29: 650 mg via ORAL
  Filled 2014-05-29: qty 2

## 2014-05-29 MED ORDER — ACETAMINOPHEN 650 MG RE SUPP: 650.0000 mg | Freq: Four times a day (QID) | RECTAL | Status: DC | PRN

## 2014-05-29 NOTE — ED Notes (Signed)
Walked patient did fine oxygen level went down low to 88 went back up to 95 sitting position heart rate stayed at 100

## 2014-05-29 NOTE — ED Notes (Signed)
Spoke with Nirvana-- friend -- called and said that "pt's doctor has cut her back on Opana, Xanax , and Fentanyl this week."

## 2014-05-29 NOTE — H&P (Signed)
Date: 05/29/2014               Patient Name:  [name] MRN: [id number]  DOB: 1960/10/18 Age / Sex: 53 y.o., female   PCP: Latoya Bales, MD         Medical Service: Internal Medicine Teaching Service         Attending Physician: Dr. Aldine Kaufman    First Contact: Dr. Albin Kaufman Pager: [phone]  Second Contact: Dr. Duwaine Kaufman Pager: [phone]       After Hours (After 5p/  First Contact Pager: [phone]  weekends / holidays): Second Contact Pager: [phone]   Chief Complaint: Chest pain  History of Present Illness: [name] is a 53yo woman w/ PMHx of COPD, depression, anxiety, GERD, polysubstance abuse with history of opiate overdose, and smoking who presented to the ED with chest pain. Patient reports around 10:30 AM this morning she felt chest pressure while drinking her coffee. She also describes a sensation of having something stuck in her throat. She report she felt like she was going to pass out so she called EMS. She states she is currently not having any chest pain. She states the pain lasted about 1 hour. She described her chest pain as substernal, 9/10 in severity, constant, non-radiating, non-pleuritic, and did not change with position. She reports associated SOB and palpitations, but denies diaphoresis, cough, wheezing. Patient notes she has felt a similar pain during an anxiety attack. She also reports that her PCP (Dr. Gordy Kaufman) has decreased some of her medications in the last week, including Xanax and Opana, and she thinks she may be going through a withdrawal.  Of note, patient was seen in the ED on 10/26 and 10/27 for abdominal pain. CT Abd/Pelvis showed findings consistent with mild colitis. She was discharged from the ED with Flagyl, Cipro, and Norco. Patient reports that she has some abdominal pain today but it has improved since 2 days ago. She also notes some nausea and diarrhea (had an enema this morning), but denies vomiting.   In the ED, patient had a  negative troponin and EKG showed normal sinus rhythm, no changes compared to previous EKG.  Meds: Current Facility-Administered Medications  Medication Dose Route Frequency Provider Last Rate Last Dose  . cyanocobalamin ((VITAMIN B-12)) injection 1,000 mcg  1,000 mcg Intramuscular Q30 days Latoya Chain, DO   1,000 mcg at 02/24/11 1218   Current Outpatient Prescriptions  Medication Sig Dispense Refill  . ALPRAZolam (XANAX) 1 MG tablet Take 1 mg by mouth 2 (two) times daily. This must last 30 days      . budesonide-formoterol (SYMBICORT) 160-4.5 MCG/ACT inhaler Inhale 2 puffs into the lungs 2 (two) times daily.  1 Inhaler  11  . ciprofloxacin (CIPRO) 500 MG tablet Take 1 tablet (500 mg total) by mouth 2 (two) times daily. One po bid x 7 days  14 tablet  0  . esomeprazole (NEXIUM) 40 MG capsule Take 1 capsule (40 mg total) by mouth daily.  30 capsule  11  . HYDROcodone-acetaminophen (NORCO) 5-325 MG per tablet Take 1 tablet by mouth every 4 (four) hours as needed.  20 tablet  0  . metroNIDAZOLE (FLAGYL) 500 MG tablet Take 1 tablet (500 mg total) by mouth 2 (two) times daily. One po bid x 7 days  14 tablet  0  . oxymorphone (OPANA) 10 MG tablet Take 10 mg by mouth daily.      . polyethylene glycol (MIRALAX / GLYCOLAX)  packet Take 17 g by mouth daily as needed for mild constipation.      . Promethazine HCl (PHENERGAN PO) Take 1 tablet by mouth every 8 (eight) hours as needed (nausea).      . fentaNYL (DURAGESIC - DOSED MCG/HR) 75 MCG/HR Place 100 mcg onto the skin every 3 (three) days.         Allergies: Allergies as of 05/29/2014 - Review Complete 05/29/2014  Allergen Reaction Noted  . Aripiprazole Other (See Comments) 03/15/2011  . Chantix [varenicline] Other (See Comments) 10/17/2012  . Cymbalta [duloxetine hcl] Other (See Comments) 09/22/2011  . Pregabalin Hives   . Zolpidem tartrate Other (See Comments)    Past Medical History  Diagnosis Date  . Anxiety   . Depression   .  Cervical cancer     Discovered by pap smear, treated by [name] with LEEP procedure.  Pap since that time has been normal.  . Chronic back pain      her CT of the head without contrast June 28, 2008- moderate to large right paracentral disc protrusion at C4-C5 resulting in a right C5 neuroforaminal stenosis and probably some degree of spinal stenosis. C. bilateral C6 neuroforaminal stenosis related to  degenerative hypertrophy, no acute fracture or loose pieces identifying the cervical spine, ligamentous injury is not excluded.    Marland Kitchen Headache(784.0)   . COPD (chronic obstructive pulmonary disease)   . Arthritis   . GERD (gastroesophageal reflux disease)  October 2011     Williams, esophagogram - tiny amount of gastroesophageal reflux otherwise unremarkable exam, done by Dr. Dellis Kaufman  . Osteoporosis     T score L femur neck = -2.6 (02/21/12)  . Fibromyalgia   . Hemorrhoids    Past Surgical History  Procedure Laterality Date  . Cervical cone biopsy      leep procedure  . Multiple extractions with alveoloplasty  06/18/2012    Procedure: MULTIPLE EXTRACION WITH ALVEOLOPLASTY;  Surgeon: [name], DDS;  Location: [facility];  Service: Oral Surgery;  Laterality: Bilateral;   Family History  Problem Relation Age of Onset  . Stroke Mother   . Diabetes Mother   . Heart disease Father   . Lung cancer Daughter    History   Social History  . Marital Status: Divorced    Spouse Name: N/A    Number of Children: N/A  . Years of Education: N/A   Occupational History  . Not on file.   Social History Main Topics  . Smoking status: Current Every Day Smoker -- 1.00 packs/day for 34 years    Types: Cigarettes  . Smokeless tobacco: Never Used     Comment: hx drugs non since 95/ TRYING TO QUITS/ HAS CUT BACK  . Alcohol Use: No  . Drug Use: No  . Sexual Activity: Not on file   Other Topics Concern  . Not on file   Social History Narrative   10th grade education. Married then divorced  mid 47's. One daughter. Crack cocaine about 1995 ish. One [name] admit 2001 for SI. Deemed disabled 04/01/2004 By [facility] due to documented health and mental issues.    Review of Systems: General: Reports 40 lb weight loss in last year. Denies fever, chills, night sweats, changes in appetite HEENT: Denies headaches, ear pain, changes in vision, rhinorrhea, sore throat CV: See HPI Pulm: See HPI GI: Denies constipation, melena, hematochezia GU: Denies dysuria, hematuria, frequency Msk: Denies muscle cramps, joint pains Neuro: Denies numbness, tingling Skin: Denies  rashes, bruising  Physical Exam: Blood pressure 115/82, pulse 96, temperature 98.8 F (37.1 C), temperature source Rectal, resp. rate 16, height _0  (1.702 m), weight 91 lb (41.277 kg), SpO2 99.00%. General: alert, sitting up in bed, cachectic  HEENT: [place]/AT, EOMI, PERRL, throat non-erythematous, mucus membranes moist Neck: supple, no JVD, no lymphadenopathy CV: mildly tachycardic, normal S1/S2, no m/g/r Pulm: CTA bilaterally, no rales, rhonchi, or wheezing, breathing comfortably on room air Abd: BS+, soft, non-tender Ext: warm, no edema, moves all Neuro: alert and oriented x 3, CNs II-XII intact, strength 5/5 in upper and lower extremities  Lab results: Basic Metabolic Panel:  Recent Labs  05/27/14 1233 05/29/14 1417  NA 137 141  K 4.1 3.0*  CL 97 101  CO2 26 27  GLUCOSE 85 128*  BUN 11 6  CREATININE 0.59 0.66  CALCIUM 9.2 8.9   Liver Function Tests:  Recent Labs  05/27/14 1233 05/29/14 1417  AST 14 13  ALT 6 6  ALKPHOS 82 77  BILITOT 0.4 0.4  PROT 7.4 6.8  ALBUMIN 3.7 3.6    Recent Labs  05/26/14 2055 05/27/14 1233  LIPASE 16 13   No results found for this basename: AMMONIA,  in the last 72 hours CBC:  Recent Labs  05/27/14 1233 05/29/14 1417  WBC 3.8* 2.8*  NEUTROABS 1.6* 2.3  HGB 13.3 13.1  HCT 39.5 38.8  MCV 94.5 95.1  PLT 204 178   Cardiac Enzymes: No results found for this  basename: CKTOTAL, CKMB, CKMBINDEX, TROPONINI,  in the last 72 hours BNP: No results found for this basename: PROBNP,  in the last 72 hours D-Dimer:  Recent Labs  05/29/14 1417  DDIMER 0.36   CBG:  Recent Labs  05/29/14 1658  GLUCAP 178*   Hemoglobin A1C: No results found for this basename: HGBA1C,  in the last 72 hours Fasting Lipid Panel: No results found for this basename: CHOL, HDL, LDLCALC, TRIG, CHOLHDL, LDLDIRECT,  in the last 72 hours Thyroid Function Tests: No results found for this basename: TSH, T4TOTAL, FREET4, T3FREE, THYROIDAB,  in the last 72 hours Anemia Panel: No results found for this basename: VITAMINB12, FOLATE, FERRITIN, TIBC, IRON, RETICCTPCT,  in the last 72 hours Coagulation: No results found for this basename: LABPROT, INR,  in the last 72 hours Urine Drug Screen: Drugs of Abuse     Component Value Date/Time   LABOPIA PPS 03/31/2014 1644   LABOPIA NONE DETECTED 02/21/2014 2316   COCAINSCRNUR NEG 03/31/2014 [place] DETECTED 02/21/2014 2316   LABBENZ PPS 03/31/2014 1644   LABBENZ POSITIVE* 02/21/2014 2316   LABBENZ  Value: POSITIVE (NOTE) Result repeated and verified. Sent for confirmatory testing* 04/29/2010 1714   AMPHETMU NONE DETECTED 02/21/2014 2316   AMPHETMU NEGATIVE 04/29/2010 1714   THCU POSITIVE* 02/21/2014 2316   THCU 21* 12/07/2011 1644   LABBARB NEG 03/31/2014 1644   LABBARB NONE DETECTED 02/21/2014 2316    Alcohol Level: No results found for this basename: ETH,  in the last 72 hours Urinalysis:  Recent Labs  05/27/14 1400  COLORURINE YELLOW  LABSPEC 1.011  PHURINE 6.0  GLUCOSEU NEGATIVE  HGBUR NEGATIVE  BILIRUBINUR NEGATIVE  KETONESUR NEGATIVE  PROTEINUR NEGATIVE  UROBILINOGEN 1.0  NITRITE NEGATIVE  LEUKOCYTESUR TRACE*    Imaging results:  Dg Chest 2 View  05/29/2014   CLINICAL DATA:  53 year old female with acute on chronic shortness of breath and epigastric pressure. Initial encounter.  EXAM: CHEST  2 VIEW  COMPARISON:  02/21/2014 and earlier.  FINDINGS: Seated AP and lateral views of the chest. Chronic large lung volumes. Normal cardiac size and mediastinal contours. Visualized tracheal air column is within normal limits. No pneumothorax, pulmonary edema, pleural effusion or acute pulmonary opacity. No acute osseous abnormality identified.  IMPRESSION: No acute cardiopulmonary abnormality.   Electronically Signed   By: Lars Pinks M.D.   On: 05/29/2014 15:07    Other results: EKG: NSR, unchanged from previous EKG  Assessment & Plan: Ms. Benecke is a 53yo woman w/ PMHx of COPD, depression, anxiety, GERD, polysubstance abuse with history of opiate overdose, and smoking who presented to the ED with chest pain likely 2/2 anxiety.  1. Chest Pain likely 2/2 Anxiety: Patient presented with chest pressure that she described as 9/10 in severity, substernal, non-radiating, constant, pressure-like, non-pleuritic, and did not change with position. Troponin in the ED was negative. EKG showed normal sinus rhythm with no changes compared with previous EKGs. CXR showed no acute cardiopulmonary process. Patient has history of anxiety and reported having similar pain during an anxiety attack. Chest pain could be due to PE as patient was tachycardic in low 100s on exam, but her Wells score is 1 (low probability) and she was saturating 96-100% on room air. Likely not ACS.  - Trend troponins - Repeat EKG in AM - Cardiac monitoring - Tylenol and Morphine for pain  2. Significant Weight Loss: Patient reports 40 lb weight loss over last year. Per records, patient's weight has been fluctuating between 125 lbs to 91 lbs since 2010. Patient was 127 lb in 2012 and then has been steadily dropping to today's weight of 91 lbs. She has had work up in the past, including colonoscopy in 2009 that showed internal hemorrhoids and endoscopy in 2010  which showed no evidence of malignancy. Recent CT Abd/Pelvis on 05/27/14 did not show evidence  of malignancy. Patient stated that she had a history of cervical cancer and received treatment. However, cannot find records in [facility]. Last pap cytology was in 2013 which showed no evidence of malignancy. Patient has extensive smoking history (40+ pack years), but no masses noted on CXR today. Patient reports that she never has a good appetite. Chronic opiate use likely contributes to her decreased appetite. Patient will need outpatient work up of her weight loss.  - Test HIV--> WBC 2.8, but diff normal  3. Lactic Acidosis: Patient found to have lactic acid of 2.29 on admission. ABG showed patient was hypoxic with pH 7.392, pCO2 41, pO2 65, bicarb 25, and O2 sat 92%. Patient did receive some albuterol in the ED, which can cause mild lactic acidosis. Patient did not have an anion gap on bmet (AG 13).  - Repeat lactic acid - IVF @ 75 ml/hr  4. Hypokalemia: K 3.0 on admission. Repleted in the ED. Will continue to monitor. - bmet in AM  5. Mild Colitis?: Patient in ED 2 days ago for abdominal pain. CT Abd/Pelvis showed evidence of mild colitis. She was discharged from the ED with Cipro and Flagyl. Patient reports she did not take her antibiotics today. She notes the abdominal pain was mildly improved from a few days ago.  - Continue Cipro 500 mg BID - Continue Flagyl 500 mg BID  6. COPD Gold Stage 3: PFTs from 09/18/09 show FEV1/FVC= 59%, FEV1= 36% predicted. Patient takes Symbicort BID at home. Patient with hypoxia on ABG likely due to her COPD.  - Continue home Symbicort - Albuterol nebs Q4H PRN -  Oxygen as needed  7. Depression/Anxiety: Patient has met criteria for major depressive disorder per clinic notes. However, patient has been reluctant to receive psychiatric help. She takes Xanax at home for anxiety. - Continue Xanax as below  8. Polysubstance Abuse: Patient has long-standing history of narcotic abuse as well as marijuana. She has a history of opiate overdose. Per clinic notes, it seems  this patient has been uncooperative with seeing psychiatry and getting help for her depression. She takes Xanax 1 mg BID, Fentanyl patch 75 mcg every 3 days, and Opana 10 mg daily. She takes Fentanyl and Opana for her chronic back and leg pain. Her Xanax was recently  titrated down to 1 mg BID from 2 mg four times daily. Patient reports last Xanax was this morning. However, UDS only + for opiates, which is appropriate.  - Continue Xanax 1 mg BID - Restart Opana tomorrow - Continue Fentanyl patch  9. GERD: Patient takes Nexium 40 mg daily at home. - Protonix 40 mg daily  Diet: Regular DVT/PE PPx: Lovenox SQ Dispo: Discharge likely tomorrow  The patient does have a current PCP (Latoya Bales, MD) and does need an [facility] hospital follow-up appointment after discharge.  The patient does not have transportation limitations that hinder transportation to clinic appointments.  Signed: Albin Felling, MD 05/29/2014, 5:46 PM

## 2014-05-29 NOTE — ED Notes (Signed)
To Xray-- no further grunting with resp-- was sleeping when transporter came in to get pt.

## 2014-05-29 NOTE — ED Notes (Addendum)
To ED via [facility] medic [facility] from home with c/o sudden onset of chest pain--"feels like something is stuck in chest" pt making grunting noise, like trying to clear throat-- moaning with each breath, receiving DuoNeb treatment per EMS-- also received SoluMedrol 125mg  IV andf Zofran 4mg  IV.

## 2014-05-29 NOTE — ED Notes (Signed)
Flow called to inquire about pt's bed request.  

## 2014-05-29 NOTE — ED Notes (Signed)
Checked patient blood sugar it was 178 notified RNof blood sugar

## 2014-05-29 NOTE — ED Provider Notes (Signed)
CSN: [id number]     Arrival date & time 05/29/14  1233 History   First MD Initiated Contact with Patient 05/29/14 1326     Chief Complaint  Patient presents with  . Shortness of Breath     (Consider location/radiation/quality/duration/timing/severity/associated sxs/prior Treatment) HPI [name] is a 53 y.o. female who presents to emergency department complaining of shortness of breath. Patient states she developed chest pain or shortness of breath this morning. States chest pain feels like pressure. States "feels like something is stuck in my chest." Patient reports dry cough. She is a smoker. History of COPD, no known heart disease. She called EMS, EMS gave her a breathing treatment, Solu-Medrol 125 mg. Patient states she is currently feeling better continues to have chest pain. Patient denies any fever, chills. No recent travel or surgeries. No swelling in extremities. No other complaints.  Past Medical History  Diagnosis Date  . Anxiety   . Depression   . Cervical cancer     Discovered by pap smear, treated by Dr. Charlesetta Garibaldi with LEEP procedure.  Pap since that time has been normal.  . Chronic back pain      her CT of the head without contrast June 28, 2008- moderate to large right paracentral disc protrusion at C4-C5 resulting in a right C5 neuroforaminal stenosis and probably some degree of spinal stenosis. C. bilateral C6 neuroforaminal stenosis related to  degenerative hypertrophy, no acute fracture or loose pieces identifying the cervical spine, ligamentous injury is not excluded.    Marland Kitchen Headache(784.0)   . COPD (chronic obstructive pulmonary disease)   . Arthritis   . GERD (gastroesophageal reflux disease)  October 2011     [facility], esophagogram - tiny amount of gastroesophageal reflux otherwise unremarkable exam, done by Dr. Dellis Filbert  . Osteoporosis     T score L femur neck = -2.6 (02/21/12)  . Fibromyalgia   . Hemorrhoids    Past Surgical History  Procedure Laterality  Date  . Cervical cone biopsy      leep procedure  . Multiple extractions with alveoloplasty  06/18/2012    Procedure: MULTIPLE EXTRACION WITH ALVEOLOPLASTY;  Surgeon: Gae Bon, DDS;  Location: [facility];  Service: Oral Surgery;  Laterality: Bilateral;   Family History  Problem Relation Age of Onset  . Stroke Mother   . Diabetes Mother   . Heart disease Father   . Lung cancer Daughter    History  Substance Use Topics  . Smoking status: Current Every Day Smoker -- 1.00 packs/day for 34 years    Types: Cigarettes  . Smokeless tobacco: Never Used     Comment: hx drugs non since 95/ TRYING TO QUITS/ HAS CUT BACK  . Alcohol Use: No   OB History   Grav Para Term Preterm Abortions TAB SAB Ect Mult Living                 Review of Systems  Constitutional: Negative for fever and chills.  Respiratory: Positive for cough, chest tightness, shortness of breath and wheezing.   Cardiovascular: Positive for chest pain. Negative for palpitations and leg swelling.  Gastrointestinal: Negative for nausea, vomiting, abdominal pain and diarrhea.  Genitourinary: Negative for dysuria and flank pain.  Musculoskeletal: Negative for arthralgias, myalgias, neck pain and neck stiffness.  Skin: Negative for rash.  Neurological: Negative for dizziness, weakness and headaches.  All other systems reviewed and are negative.     Allergies  Aripiprazole; Chantix; Cymbalta; Pregabalin; and Zolpidem tartrate  Home  Medications   Prior to Admission medications   Medication Sig Start Date End Date Taking? Authorizing Provider  albuterol (PROAIR HFA) 108 (90 BASE) MCG/ACT inhaler Inhale 1 puff into the lungs every 4 (four) hours as needed for wheezing or shortness of breath. 01/08/14   Hester Mates, MD  ALPRAZolam Duanne Moron) 1 MG tablet Take 1 tablet (1 mg total) by mouth 2 (two) times daily as needed for anxiety. This must last 30 days 05/16/14   Bartholomew Crews, MD  budesonide-formoterol [facility])  160-4.5 MCG/ACT inhaler Inhale 2 puffs into the lungs 2 (two) times daily. 01/08/14   Hester Mates, MD  ciprofloxacin (CIPRO) 500 MG tablet Take 1 tablet (500 mg total) by mouth 2 (two) times daily. One po bid x 7 days 05/27/14   Richarda Blade, MD  esomeprazole (NEXIUM) 40 MG capsule Take 1 capsule (40 mg total) by mouth daily. 01/08/14   Hester Mates, MD  fentaNYL (DURAGESIC - DOSED MCG/HR) 75 MCG/HR Place 75 mcg onto the skin every 3 (three) days. 05/21/14   Jones Bales, MD  HYDROcodone-acetaminophen (NORCO) 5-325 MG per tablet Take 1 tablet by mouth every 4 (four) hours as needed. 05/27/14   Richarda Blade, MD  metroNIDAZOLE (FLAGYL) 500 MG tablet Take 1 tablet (500 mg total) by mouth 2 (two) times daily. One po bid x 7 days 05/27/14   Richarda Blade, MD  oxymorphone (OPANA) 10 MG tablet Take 10 mg by mouth daily. 05/21/14   Jones Bales, MD  polyethylene glycol (MIRALAX / GLYCOLAX) packet Take 17 g by mouth daily as needed for mild constipation.    Historical Provider, MD  polyethylene glycol powder (GLYCOLAX/MIRALAX) powder MIX 17 GRAMS (1 CAPFUL) INTO 8 OZ. OF WATER TWICE A DAY 05/28/14   Irene Shipper, MD  Promethazine HCl (PHENERGAN PO) Take 1 tablet by mouth every 8 (eight) hours as needed (nausea).    Historical Provider, MD   BP 141/74  Pulse 83  Temp(Src) 98.4 F (36.9 C) (Oral)  Resp 20  Ht 5\' 7"  (1.702 m)  Wt 91 lb (41.277 kg)  BMI 14.25 kg/m2  SpO2 95% Physical Exam  Nursing note and vitals reviewed. Constitutional: She is oriented to person, place, and time. She appears well-developed and well-nourished.  Thin appearing. Anxious, crying.   HENT:  Head: Normocephalic.  Eyes: Conjunctivae are normal.  Neck: Normal range of motion. Neck supple.  Cardiovascular: Normal rate, regular rhythm and normal heart sounds.   Pulmonary/Chest: Effort normal. She has no wheezes. She has no rales.  Tachypnec. Decreased air movement bilaterally. No wheezing or stridor   Abdominal: Soft. Bowel sounds are normal. She exhibits no distension. There is no tenderness. There is no rebound.  Musculoskeletal: She exhibits no edema.  Neurological: She is alert and oriented to person, place, and time.  Skin: Skin is warm and dry.  Psychiatric: She has a normal mood and affect. Her behavior is normal.    ED Course  Procedures (including critical care time) Labs Review Labs Reviewed  CBC WITH DIFFERENTIAL  COMPREHENSIVE METABOLIC PANEL  D-DIMER, QUANTITATIVE  I-STAT [place], ED    Imaging Review Ct Abdomen Pelvis W Contrast  05/27/2014   CLINICAL DATA:  53 year old female with 5 day history of abdominal pain and generalized body aches. Episode of constipation 1 week ago. Weight loss. History of cervical cancer. Initial encounter.  EXAM: CT ABDOMEN AND PELVIS WITH CONTRAST  TECHNIQUE: Multidetector CT imaging of the abdomen and  pelvis was performed using the standard protocol following bolus administration of intravenous contrast.  CONTRAST:  40mL OMNIPAQUE IOHEXOL 300 MG/ML  SOLN  COMPARISON:  02/22/2014 and 01/18/2008 CT.  FINDINGS: Portions of the mid to distal sigmoid colon are under distended with appearance of mild bowel wall thickening with minimal haziness of surrounding fat planes which may represent changes of colitis. No free intraperitoneal air. No abnormal fluid collection.  Calcification left lobe liver. Intrahepatic biliary ducts top-normal in size. No worrisome hepatic lesion. No splenic, pancreatic, adrenal or renal abnormality noted. No calcified gallstones.  Retroperitoneal shotty lymph nodes (which have increased in size since 2009 as previously described) are stable from the most recent CT. With the patient's history of cervical cancer, stability of these lymph nodes can be assessed on follow up.  Atherosclerotic type changes of the aorta with moderate narrowing lower abdominal aorta. Atherosclerotic type changes common iliac arteries with mild to  slightly moderate narrowing. Moderate narrowing origin of the celiac artery.  Visualized lung bases clear.  Heart size within normal limits.  Prominent vessels extend to the left aspect of the uterus/adnexae without significant change of questionable etiology.  Noncontrast filled imaging of the urinary bladder unremarkable. Calcification adjacent to left bladder wall appears outside the bladder.  No osseous destructive lesion.  IMPRESSION: Portions of the mid to distal sigmoid colon are under distended with appearance of mild bowel wall thickening with minimal haziness of surrounding fat planes which may represent changes of colitis. No free intraperitoneal air. No abnormal fluid collection.  Intrahepatic biliary ducts top-normal in size.  Retroperitoneal shotty lymph nodes (which have increased in size since 2009 as previously described) are stable from the most recent CT. With the patient's history of cervical cancer, stability of these lymph nodes can be assessed on follow up.  Atherosclerotic type changes of the aorta with moderate narrowing lower abdominal aorta. Atherosclerotic type changes common iliac arteries with mild to slightly moderate narrowing. Moderate narrowing origin of the celiac artery.  Prominent vessels extend to the left aspect of the uterus/adnexae without significant change of questionable etiology.   Electronically Signed   By: Chauncey Cruel M.D.   On: 05/27/2014 16:13     EKG Interpretation   Date/Time:  Thursday May 29 2014 12:45:20 EDT Ventricular Rate:  91 PR Interval:    QRS Duration: 77 QT Interval:  378 QTC Calculation: 465 R Axis:   83 Text Interpretation:  Normal sinus rhythm No significant change since last  tracing Confirmed by Pelican ([id number]) on 05/29/2014 2:58:54 PM      MDM   Final diagnoses:  Shortness of breath    Patient with history of COPD, presented to emergency department chest pain or shortness of breath. Patient states her  symptoms began this morning. She received Solu-Medrol and breathing treatment by EMS. Currently reports shortness of breath, hollering out of the room, appears very anxious. Will start another breathing treatment, labs and chest x-ray ordered, will also order d-dimer, apparently hypoxic on EMS arrival. EKG is unremarkable.   4:38 PM Blood pressure marginal, however patient is a very thin person. She is feeling better after breathing treatments. She is very sleepy, will order ABG. Oxygen saturation improved, and 90s on 2 L. Patient ambulated hallway with pulse ox, dropped into the mid 80s. Discussed with teaching service, will come by and see patient. Patient most likely will need to come in for shortness of breath and chest pain rule out  Filed Vitals:  05/29/14 1507 05/29/14 1555 05/29/14 1622 05/29/14 1628  BP: 85/66 95/51 93/56    Pulse: 88 95    Temp:    98.8 F (37.1 C)  TempSrc:    Rectal  Resp: 20 17 12    Height:      Weight:      SpO2: 94% 92% 95%       Renold Genta, PA-C 05/29/14 1641

## 2014-05-30 ENCOUNTER — Encounter (HOSPITAL_COMMUNITY): Payer: Self-pay | Admitting: General Practice

## 2014-05-30 DIAGNOSIS — J441 Chronic obstructive pulmonary disease with (acute) exacerbation: Secondary | ICD-10-CM | POA: Diagnosis not present

## 2014-05-30 DIAGNOSIS — F419 Anxiety disorder, unspecified: Secondary | ICD-10-CM

## 2014-05-30 DIAGNOSIS — F1120 Opioid dependence, uncomplicated: Secondary | ICD-10-CM

## 2014-05-30 DIAGNOSIS — E43 Unspecified severe protein-calorie malnutrition: Secondary | ICD-10-CM

## 2014-05-30 DIAGNOSIS — K529 Noninfective gastroenteritis and colitis, unspecified: Secondary | ICD-10-CM

## 2014-05-30 DIAGNOSIS — K219 Gastro-esophageal reflux disease without esophagitis: Secondary | ICD-10-CM

## 2014-05-30 DIAGNOSIS — E876 Hypokalemia: Secondary | ICD-10-CM

## 2014-05-30 DIAGNOSIS — R109 Unspecified abdominal pain: Secondary | ICD-10-CM | POA: Diagnosis present

## 2014-05-30 DIAGNOSIS — R634 Abnormal weight loss: Secondary | ICD-10-CM

## 2014-05-30 DIAGNOSIS — D72819 Decreased white blood cell count, unspecified: Secondary | ICD-10-CM

## 2014-05-30 DIAGNOSIS — R0789 Other chest pain: Secondary | ICD-10-CM

## 2014-05-30 DIAGNOSIS — F1920 Other psychoactive substance dependence, uncomplicated: Secondary | ICD-10-CM

## 2014-05-30 DIAGNOSIS — E872 Acidosis: Secondary | ICD-10-CM

## 2014-05-30 DIAGNOSIS — J449 Chronic obstructive pulmonary disease, unspecified: Secondary | ICD-10-CM

## 2014-05-30 DIAGNOSIS — F329 Major depressive disorder, single episode, unspecified: Secondary | ICD-10-CM | POA: Diagnosis not present

## 2014-05-30 DIAGNOSIS — F418 Other specified anxiety disorders: Secondary | ICD-10-CM

## 2014-05-30 DIAGNOSIS — R079 Chest pain, unspecified: Secondary | ICD-10-CM | POA: Diagnosis not present

## 2014-05-30 LAB — BASIC METABOLIC PANEL
Anion gap: 11 (ref 5–15)
BUN: 11 mg/dL (ref 6–23)
CALCIUM: 8.6 mg/dL (ref 8.4–10.5)
CHLORIDE: 103 meq/L (ref 96–112)
CO2: 24 mEq/L (ref 19–32)
Creatinine, Ser: 0.57 mg/dL (ref 0.50–1.10)
GFR calc Af Amer: >90 mL/min (ref >90)
GFR calc non Af Amer: >90 mL/min (ref >90)
GLUCOSE: 115 mg/dL — AB (ref 70–99)
Potassium: 4.6 mEq/L (ref 3.7–5.3)
Sodium: 138 mEq/L (ref 137–147)

## 2014-05-30 LAB — BLOOD GAS, ARTERIAL
ACID-BASE DEFICIT: 0.8 mmol/L (ref 0.0–2.0)
BICARBONATE: 23.3 meq/L (ref 20.0–24.0)
Drawn by: 369891
FIO2: 0.21 %
O2 SAT: 96.9 %
PO2 ART: 89.1 mmHg (ref 80.0–100.0)
Patient temperature: 98.6
TCO2: 24.5 mmol/L (ref 0–100)
pCO2 arterial: 38.1 mmHg (ref 35.0–45.0)
pH, Arterial: 7.404 (ref 7.350–7.450)

## 2014-05-30 LAB — CBC
HCT: 33.1 % — ABNORMAL LOW (ref 36.0–46.0)
Hemoglobin: 11 g/dL — ABNORMAL LOW (ref 12.0–15.0)
MCH: 31.4 pg (ref 26.0–34.0)
MCHC: 33.2 g/dL (ref 30.0–36.0)
MCV: 94.6 fL (ref 78.0–100.0)
Platelets: 186 10*3/uL (ref 150–400)
RBC: 3.5 MIL/uL — AB (ref 3.87–5.11)
RDW: 13.8 % (ref 11.5–15.5)
WBC: 7.7 10*3/uL (ref 4.0–10.5)

## 2014-05-30 LAB — HIV ANTIBODY (ROUTINE TESTING W REFLEX): HIV: NONREACTIVE

## 2014-05-30 LAB — TROPONIN I
Troponin I: <0.3 ng/mL (ref <0.30)
Troponin I: <0.3 ng/mL (ref <0.30)

## 2014-05-30 LAB — LACTIC ACID, PLASMA: LACTIC ACID, VENOUS: 2.3 mmol/L — AB (ref 0.5–2.2)

## 2014-05-30 MED ORDER — SODIUM CHLORIDE 0.9 % IV BOLUS (SEPSIS)
1000.0000 mL | Freq: Once | INTRAVENOUS | Status: AC
  Administered 2014-05-30: 1000 mL via INTRAVENOUS

## 2014-05-30 MED ORDER — PROMETHAZINE HCL 12.5 MG PO TABS
12.5000 mg | ORAL_TABLET | Freq: Four times a day (QID) | ORAL | Status: DC | PRN
  Administered 2014-05-30: 12.5 mg via ORAL
  Filled 2014-05-30: qty 1

## 2014-05-30 MED ORDER — BOOST PLUS PO LIQD
237.0000 mL | Freq: Three times a day (TID) | ORAL | Status: DC
Start: 2014-05-30 — End: 2014-05-30
  Filled 2014-05-30 (×5): qty 237

## 2014-05-30 NOTE — H&P (Signed)
Pt seen and examined with Dr. Arcelia Jew. I agree with documentation as outlined. Please refer to my progress note from today for details

## 2014-05-30 NOTE — Discharge Summary (Signed)
Name: [name] MRN: [id number] DOB: 1960-08-29 53 y.o. PCP: Jones Bales, MD  Date of Admission: 05/29/2014 12:33 PM Date of Discharge: 05/30/14 Attending Physician: Aldine Contes, MD  Discharge Diagnosis: 1.  Active Problems:   Polysubstance dependence including opioid drug with daily use   Atypical chest pain   Abdominal pain   Protein-calorie malnutrition, severe  Discharge Medications:   Medication List    STOP taking these medications        HYDROcodone-acetaminophen 5-325 MG per tablet  Commonly known as:  NORCO      TAKE these medications        ALPRAZolam 1 MG tablet  Commonly known as:  XANAX  Take 1 mg by mouth 2 (two) times daily. This must last 30 days     budesonide-formoterol 160-4.5 MCG/ACT inhaler  Commonly known as:  SYMBICORT  Inhale 2 puffs into the lungs 2 (two) times daily.     ciprofloxacin 500 MG tablet  Commonly known as:  CIPRO  Take 1 tablet (500 mg total) by mouth 2 (two) times daily. One po bid x 7 days     esomeprazole 40 MG capsule  Commonly known as:  NEXIUM  Take 1 capsule (40 mg total) by mouth daily.     fentaNYL 75 MCG/HR  Commonly known as:  DURAGESIC - dosed mcg/hr  Place 75 mcg onto the skin every 3 (three) days.     metroNIDAZOLE 500 MG tablet  Commonly known as:  FLAGYL  Take 1 tablet (500 mg total) by mouth 2 (two) times daily. One po bid x 7 days     oxymorphone 10 MG tablet  Commonly known as:  OPANA  Take 10 mg by mouth daily.     PHENERGAN PO  Take 1 tablet by mouth every 8 (eight) hours as needed (nausea).     polyethylene glycol packet  Commonly known as:  MIRALAX / GLYCOLAX  Take 17 g by mouth daily as needed for mild constipation.        Disposition and follow-up:   [name] was discharged from [facility] in Good condition.  At the hospital follow up visit please address:  1.  Significant Weight Loss/Malnutrition: Patient would benefit from malignancy work  up given weight loss and history of smoking.  Leukopenia: Pt had WBC count of 2.8 on admission, which trended up to 7.7 the following day. Pt will need repeat blood work.   2.  Labs / imaging needed at time of follow-up: CBC, bmet, lactic acid  3.  Pending labs/ test needing follow-up: HIV  Follow-up Appointments: Follow-up Information    Follow up with Cresenciano Genre, MD.   Specialty:  Internal Medicine   Why:  Appointment on Nov 9th at 2:15 PM    Contact information:   [place] [id number] [phone]       Follow up with [place].   Why:  Physical Therapy   Contact information:   [address] [place] [address] [phone]       Follow up with [place].   Why:  rolling walker   Contact information:   [place] [phone]       Discharge Instructions: Discharge Instructions    Diet - low sodium heart healthy    Complete by:  As directed      Face-to-face encounter (required for Medicare/Medicaid  patients)    Complete by:  As directed   I Lynisha Osuch certify that this patient is under my care and that I, or a nurse practitioner or physician's assistant working with me, had a face-to-face encounter that meets the physician face-to-face encounter requirements with this patient on 05/30/2014. The encounter with the patient was in whole, or in part for the following medical condition(s) which is the primary reason for home health care: Weakness, significant weight loss  The encounter with the patient was in whole, or in part, for the following medical condition, which is the primary reason for home health care:  Weakness, significant weight loss  I certify that, based on my findings, the following services are medically necessary home health services:  Physical therapy  Reason for Medically Necessary Home Health Services:  Therapy- Home Adaptation to Facilitate Safety  My  clinical findings support the need for the above services:  Unable to leave home safely without assistance and/or assistive device  Further, I certify that my clinical findings support that this patient is homebound due to:  Unable to leave home safely without assistance     Home Health    Complete by:  As directed   To provide the following care/treatments:  PT     Increase activity slowly    Complete by:  As directed            Consultations:    Procedures Performed:  Dg Chest 2 View  05/29/2014   CLINICAL DATA:  53 year old female with acute on chronic shortness of breath and epigastric pressure. Initial encounter.  EXAM: CHEST  2 VIEW  COMPARISON:  02/21/2014 and earlier.  FINDINGS: Seated AP and lateral views of the chest. Chronic large lung volumes. Normal cardiac size and mediastinal contours. Visualized tracheal air column is within normal limits. No pneumothorax, pulmonary edema, pleural effusion or acute pulmonary opacity. No acute osseous abnormality identified.  IMPRESSION: No acute cardiopulmonary abnormality.   Electronically Signed   By: Lars Pinks M.D.   On: 05/29/2014 15:07   Ct Abdomen Pelvis W Contrast  05/27/2014   CLINICAL DATA:  53 year old female with 5 day history of abdominal pain and generalized body aches. Episode of constipation 1 week ago. Weight loss. History of cervical cancer. Initial encounter.  EXAM: CT ABDOMEN AND PELVIS WITH CONTRAST  TECHNIQUE: Multidetector CT imaging of the abdomen and pelvis was performed using the standard protocol following bolus administration of intravenous contrast.  CONTRAST:  4m OMNIPAQUE IOHEXOL 300 MG/ML  SOLN  COMPARISON:  02/22/2014 and 01/18/2008 CT.  FINDINGS: Portions of the mid to distal sigmoid colon are under distended with appearance of mild bowel wall thickening with minimal haziness of surrounding fat planes which may represent changes of colitis. No free intraperitoneal air. No abnormal fluid collection.   Calcification left lobe liver. Intrahepatic biliary ducts top-normal in size. No worrisome hepatic lesion. No splenic, pancreatic, adrenal or renal abnormality noted. No calcified gallstones.  Retroperitoneal shotty lymph nodes (which have increased in size since 2009 as previously described) are stable from the most recent CT. With the patient's history of cervical cancer, stability of these lymph nodes can be assessed on follow up.  Atherosclerotic type changes of the aorta with moderate narrowing lower abdominal aorta. Atherosclerotic type changes common iliac arteries with mild to slightly moderate narrowing. Moderate narrowing origin of the celiac artery.  Visualized lung bases clear.  Heart size within normal limits.  Prominent vessels extend to the left aspect of the  uterus/adnexae without significant change of questionable etiology.  Noncontrast filled imaging of the urinary bladder unremarkable. Calcification adjacent to left bladder wall appears outside the bladder.  No osseous destructive lesion.  IMPRESSION: Portions of the mid to distal sigmoid colon are under distended with appearance of mild bowel wall thickening with minimal haziness of surrounding fat planes which may represent changes of colitis. No free intraperitoneal air. No abnormal fluid collection.  Intrahepatic biliary ducts top-normal in size.  Retroperitoneal shotty lymph nodes (which have increased in size since 2009 as previously described) are stable from the most recent CT. With the patient's history of cervical cancer, stability of these lymph nodes can be assessed on follow up.  Atherosclerotic type changes of the aorta with moderate narrowing lower abdominal aorta. Atherosclerotic type changes common iliac arteries with mild to slightly moderate narrowing. Moderate narrowing origin of the celiac artery.  Prominent vessels extend to the left aspect of the uterus/adnexae without significant change of questionable etiology.    Electronically Signed   By: Chauncey Cruel M.D.   On: 05/27/2014 16:13    Admission HPI: Ms. Luper is a 53yo woman w/ PMHx of COPD, depression, anxiety, GERD, polysubstance abuse with history of opiate overdose, and smoking who presented to the ED with chest pain. Patient reports around 10:30 AM this morning she felt chest pressure while drinking her coffee. She also describes a sensation of having something stuck in her throat. She report she felt like she was going to pass out so she called EMS. She states she is currently not having any chest pain. She states the pain lasted about 1 hour. She described her chest pain as substernal, 9/10 in severity, constant, non-radiating, non-pleuritic, and did not change with position. She reports associated SOB and palpitations, but denies diaphoresis, cough, wheezing. Patient notes she has felt a similar pain during an anxiety attack. She also reports that her PCP (Dr. Gordy Levan) has decreased some of her medications in the last week, including Xanax and Opana, and she thinks she may be going through a withdrawal.  Of note, patient was seen in the ED on 10/26 and 10/27 for abdominal pain. CT Abd/Pelvis showed findings consistent with mild colitis. She was discharged from the ED with Flagyl, Cipro, and Norco. Patient reports that she has some abdominal pain today but it has improved since 2 days ago. She also notes some nausea and diarrhea (had an enema this morning), but denies vomiting.   In the ED, patient had a negative troponin and EKG showed normal sinus rhythm, no changes compared to previous EKG.  Hospital Course by problem list: Active Problems:   Polysubstance dependence including opioid drug with daily use   Atypical chest pain   Abdominal pain   Protein-calorie malnutrition, severe  Ms. Rambert is a 53yo woman w/ PMHx of COPD, depression, anxiety, GERD, polysubstance abuse with history of opiate overdose, and smoking who presented to the ED with chest pain  likely 2/2 anxiety.  1. Chest Pain likely 2/2 Anxiety: Patient presented with chest pressure that she described as 9/10 in severity, substernal, non-radiating, constant, pressure-like, non-pleuritic, and did not change with position. Troponins negative x 3.  EKG showed normal sinus rhythm with no changes compared with previous EKGs. CXR showed no acute cardiopulmonary process. Patient has history of anxiety and reported having similar pain during an anxiety attack. She was placed on cardiac monitoring and no acute events occurred. Cardiology saw her and believe she does not have any evidence of  ischemia and requires no further inpatient or outpatient work up.  2. Significant Weight Loss: Patient reported 40 lb weight loss over last year. Per records, patient's weight has been fluctuating between 125 lbs to 91 lbs since 2010. Patient was 127 lb in 2012 and then has been steadily dropping to weight of 91 lbs. She has had work up in the past, including colonoscopy in 2009 that showed internal hemorrhoids and endoscopy in 2010which showed no evidence of malignancy. Recent CT Abd/Pelvis on 05/27/14 did not show evidence of malignancy. Patient stated that she had a history of cervical cancer and received treatment. However, cannot find records in [facility]. Last pap cytology was in 2013 which showed no evidence of malignancy. Patient has extensive smoking history (40+ pack years), but no masses noted on CXR. Patient reports that she never has a good appetite. Chronic opiate use likely contributes to her decreased appetite. Patient will need outpatient work up of her weight loss. HIV pending at time of discharge. Nutrition evaluated patient and recommended Boost TID in between meals which was prescribed to her upon discharge.   3. Leukopenia: Patient with WBC count 2.8 on admission. Differential showed elevated neutrophils at 84% and low absolute lymphs at 0.4. The following morning WBC count 7.7. Patient will need  repeat CBC at outpatient visit. Concerning given significant weight loss and need for work up for malignancy. HIV pending at time of discharge.   4. Lactic Acidosis: Patient found to have lactic acid of 2.29 on admission. She was started on IVF @ 100 ml/hr. On repeat was 2.3. Repeat ABG was normal with pH 7.4, pCO2 38, pO2 89, bicarb 23, and O2 sat 97%. Likely due to albuterol as beta agonists have been associated with elevating lactic acid. Patient will need repeat lactic acid at f/u visit to ensure lactic acidosis has resolved.   5. Hypokalemia: K 3.0 on admission. Her K was repleted in the ED. Repeat K 4.6. Patient will need bmet at outpatient f/u visit.   6. Mild Colitis: Patient was in the ED 2 days ago for abdominal pain. CT Abd/Pelvis showed evidence of mild colitis. She was discharged from the ED with Cipro and Flagyl. Patient reported she did not take her antibiotics the day of admission. She was continued on Cipro 500 mg BID and Flagyl 500 mg BID. She was recommended at discharge to continue a 7 day course of Cipro and Flagyl.   7. COPD Gold Stage 3: PFTs from 09/18/09 show FEV1/FVC= 59%, FEV1= 36% predicted. Patient takes Symbicort BID at home. Her initial ABG showed hypoxia with pO2 65, but repeat ABG showed pO2 improved to 89.  She was continued on her home Symbicort, albuterol nebs Q4H PRN, and oxygen as needed.   8. Depression/Anxiety: Patient has met criteria for major depressive disorder per clinic notes. However, patient has been reluctant to receive psychiatric help. She takes Xanax at home for anxiety. She was continued on her home Xanax 1 mg BID.   9. Polysubstance Abuse: Patient has long-standing history of narcotic abuse as well as marijuana. She has a history of opiate overdose. Per clinic notes, it seems this patient has been uncooperative with seeing psychiatry and getting help for her depression. She takes Xanax 1 mg BID, Fentanyl patch 75 mcg every 3 days, and Opana 10 mg  daily. She takes Fentanyl and Opana for her chronic back and leg pain. Her Xanax was recently titrated down to 1 mg BID from 2 mg four times daily.  Patient reports last Xanax was morning of admission. However, UDS only + for opiates, which is appropriate. She was placed on Xanax 1 mg BID and Fentanyl patch 75 mcg. Her home Opana, in addition to her other home meds, was continued upon discharge.   10. GERD: Patient takes Nexium 40 mg daily at home. She was continued on Protonix 40 mg daily.    Discharge Vitals:   BP 122/68 mmHg  Pulse 71  Temp(Src) 98.3 F (36.8 C) (Oral)  Resp 16  Ht 5' 7"  (1.702 m)  Wt 41.776 kg (92 lb 1.6 oz)  BMI 14.42 kg/m2  SpO2 99% Physical Exam General: alert, sitting up in chair reading the newspaper, NAD, cachectic HEENT: [place]/AT, EOMI, mucus membranes moist CV: RRR, normal S1/S2, no m/g/r Pulm: CTA bilaterally, breaths non-labored Abd: BS+, soft, non-tender Ext: warm, no edema, moves all Neuro: alert and oriented x 3, CNs II-XII intact  Discharge Labs:  No results found for this or any previous visit (from the past 24 hour(s)).  Signed: Albin Felling, MD 06/02/2014, 3:19 PM    Services Ordered on Discharge: None Equipment Ordered on Discharge: None

## 2014-05-30 NOTE — Progress Notes (Addendum)
INITIAL NUTRITION ASSESSMENT  DOCUMENTATION CODES Per approved criteria  -Severe malnutrition in the context of chronic illness -Underweight    Pt meets criteria for severe MALNUTRITION in the context of chronic illness as evidenced by severe depletion of muscle mass, intake </= 75% of estimated energy requirement for > 1 month, and 23% weight loss in 1 year.  INTERVENTION:  Boost Plus PO TID between meals, each supplement provides 360 kcals and 14 gm protein.  NUTRITION DIAGNOSIS: Malnutrition related to inadequate oral intake as evidenced by severe depletion of muscle mass, intake </= 75% of estimated energy requirement for > 1 month, and 23% weight loss in 1 year.   Goal: Intake to meet >90% of estimated nutrition needs.  Monitor:  PO intake, labs, weight trend.  Reason for Assessment: MST  53 y.o. female  Admitting Dx: Chest Pain  ASSESSMENT: 53 yo woman w/ PMHx of COPD, depression, anxiety, GERD, polysubstance abuse with history of opiate overdose, and smoking who presented to the ED with chest pain.   Patient reports very poor intake for the past several months. She used to weigh 120 lb one year ago. She is currently nauseous and unable to take more than a few bites of breakfast.  Nutrition Focused Physical Exam:  Subcutaneous Fat:  Orbital Region: WNL Upper Arm Region: moderate depletion Thoracic and Lumbar Region: NA  Muscle:  Temple Region: moderate depletion Clavicle Bone Region: severe depletion Clavicle and Acromion Bone Region: severe depletion Scapular Bone Region: NA Dorsal Hand: mild depletion Patellar Region: severe depletion Anterior Thigh Region: severe depletion Posterior Calf Region: severe depletion  Edema: none    Height: Ht Readings from Last 1 Encounters:  05/29/14 5\' 7"  (1.702 m)    Weight: Wt Readings from Last 1 Encounters:  05/30/14 92 lb 1.6 oz (41.776 kg)    Ideal Body Weight: 61.4 kg  % Ideal Body Weight: 68%  Wt  Readings from Last 10 Encounters:  05/30/14 92 lb 1.6 oz (41.776 kg)  05/27/14 98 lb (44.453 kg)  05/26/14 91 lb (41.277 kg)  05/21/14 91 lb 6.4 oz (41.459 kg)  04/28/14 91 lb 12.8 oz (41.64 kg)  04/24/14 91 lb 4.8 oz (41.413 kg)  03/31/14 95 lb 12.8 oz (43.455 kg)  03/31/14 95 lb 9.6 oz (43.364 kg)  02/20/14 95 lb 4.8 oz (43.228 kg)  02/03/14 94 lb 9.6 oz (42.91 kg)    Usual Body Weight: 120 lb  % Usual Body Weight: 77%  BMI:  Body mass index is 14.42 kg/(m^2). Underweight  Estimated Nutritional Needs: Kcal: 1300-1500 Protein: 65-80 gm Fluid: 1.5 L  Skin: no issues  Diet Order: General  EDUCATION NEEDS: -Education needs addressed   Intake/Output Summary (Last 24 hours) at 05/30/14 0904 Last data filed at 05/30/14 0500  Gross per 24 hour  Intake   1765 ml  Output      0 ml  Net   1765 ml    Last BM: 10/28   Labs:   Recent Labs Lab 05/26/14 2055 05/27/14 1233 05/29/14 1417  NA 137 137 141  K 3.7 4.1 3.0*  CL 94* 97 101  CO2 29 26 27   BUN 15 11 6   CREATININE 0.75 0.59 0.66  CALCIUM 9.7 9.2 8.9  GLUCOSE 97 85 128*    CBG (last 3)   Recent Labs  05/29/14 1658  GLUCAP 178*    Scheduled Meds: . budesonide-formoterol  2 puff Inhalation BID  . ciprofloxacin  500 mg Oral BID  . enoxaparin (  LOVENOX) injection  30 mg Subcutaneous Q24H  . fentaNYL  75 mcg Transdermal Q72H  . metroNIDAZOLE  500 mg Oral BID  . morphine  30 mg Oral Daily  . pantoprazole  80 mg Oral Q1200  . sodium chloride  1,000 mL Intravenous Once  . sodium chloride  3 mL Intravenous Q12H    Continuous Infusions: . sodium chloride 75 mL/hr at 05/29/14 2336    Past Medical History  Diagnosis Date  . Anxiety   . Depression   . Cervical cancer     Discovered by pap smear, treated by Dr. Charlesetta Garibaldi with LEEP procedure.  Pap since that time has been normal.  . Chronic back pain      her CT of the head without contrast June 28, 2008- moderate to large right paracentral disc  protrusion at C4-C5 resulting in a right C5 neuroforaminal stenosis and probably some degree of spinal stenosis. C. bilateral C6 neuroforaminal stenosis related to  degenerative hypertrophy, no acute fracture or loose pieces identifying the cervical spine, ligamentous injury is not excluded.    Marland Kitchen Headache(784.0)   . COPD (chronic obstructive pulmonary disease)   . Arthritis   . GERD (gastroesophageal reflux disease)  October 2011     Williams, esophagogram - tiny amount of gastroesophageal reflux otherwise unremarkable exam, done by Dr. Dellis Filbert  . Osteoporosis     T score L femur neck = -2.6 (02/21/12)  . Fibromyalgia   . Hemorrhoids     Past Surgical History  Procedure Laterality Date  . Cervical cone biopsy      leep procedure  . Multiple extractions with alveoloplasty  06/18/2012    Procedure: MULTIPLE EXTRACION WITH ALVEOLOPLASTY;  Surgeon: Gae Bon, DDS;  Location: [facility];  Service: Oral Surgery;  Laterality: Bilateral;     Molli Barrows, RD, LDN, [place] Pager [phone] After Hours Pager [phone]

## 2014-05-30 NOTE — Progress Notes (Signed)
Subjective: Patient reports her chest discomfort has improved this morning. She notes some abdominal pain, but otherwise is feeling better compared to yesterday.  Objective: Vital signs in last 24 hours: Filed Vitals:   05/29/14 2044 05/29/14 2141 05/30/14 0523 05/30/14 0757  BP: 110/73  97/61   Pulse: 99 97 89   Temp: 97.7 F (36.5 C)  98.6 F (37 C)   TempSrc: Axillary  Oral   Resp:  16    Height: _0  (1.702 m)     Weight:   92 lb 1.6 oz (41.776 kg)   SpO2: 100% 100% 97% 97%   Weight change:   Intake/Output Summary (Last 24 hours) at 05/30/14 1148 Last data filed at 05/30/14 0500  Gross per 24 hour  Intake   1765 ml  Output      0 ml  Net   1765 ml   Physical Exam General: alert, sitting up in chair reading the newspaper, NAD, cachectic HEENT: [place]/AT, EOMI, mucus membranes moist CV: RRR, normal S1/S2, no m/g/r Pulm: CTA bilaterally, breaths non-labored Abd: BS+, soft, non-tender Ext: warm, no edema, moves all Neuro: alert and oriented x 3, CNs II-XII intact  Lab Results: Basic Metabolic Panel:  Recent Labs Lab 05/29/14 1417 05/30/14 0912  NA 141 138  K 3.0* 4.6  CL 101 103  CO2 27 24  GLUCOSE 128* 115*  BUN 6 11  CREATININE 0.66 0.57  CALCIUM 8.9 8.6   Liver Function Tests:  Recent Labs Lab 05/27/14 1233 05/29/14 1417  AST 14 13  ALT 6 6  ALKPHOS 82 77  BILITOT 0.4 0.4  PROT 7.4 6.8  ALBUMIN 3.7 3.6    Recent Labs Lab 05/26/14 2055 05/27/14 1233  LIPASE 16 13   No results found for this basename: AMMONIA,  in the last 168 hours CBC:  Recent Labs Lab 05/27/14 1233 05/29/14 1417 05/30/14 0912  WBC 3.8* 2.8* 7.7  NEUTROABS 1.6* 2.3  --   HGB 13.3 13.1 11.0*  HCT 39.5 38.8 33.1*  MCV 94.5 95.1 94.6  PLT 204 178 186   Cardiac Enzymes:  Recent Labs Lab 05/29/14 2136 05/30/14 0310 05/30/14 0912  TROPONINI <0.30 <0.30 <0.30   BNP: No results found for this basename: PROBNP,  in the last 168 hours D-Dimer:  Recent  Labs Lab 05/29/14 1417  DDIMER 0.36   CBG:  Recent Labs Lab 05/29/14 1658  GLUCAP 178*   Hemoglobin A1C: No results found for this basename: HGBA1C,  in the last 168 hours Fasting Lipid Panel: No results found for this basename: CHOL, HDL, LDLCALC, TRIG, CHOLHDL, LDLDIRECT,  in the last 168 hours Thyroid Function Tests: No results found for this basename: TSH, T4TOTAL, FREET4, T3FREE, THYROIDAB,  in the last 168 hours Coagulation: No results found for this basename: LABPROT, INR,  in the last 168 hours Anemia Panel: No results found for this basename: VITAMINB12, FOLATE, FERRITIN, TIBC, IRON, RETICCTPCT,  in the last 168 hours Urine Drug Screen: Drugs of Abuse     Component Value Date/Time   LABOPIA POSITIVE* 05/29/2014 1651   LABOPIA PPS 03/31/2014 1644   COCAINSCRNUR NONE DETECTED 05/29/2014 1651   COCAINSCRNUR NEG 03/31/2014 1644   LABBENZ NONE DETECTED 05/29/2014 1651   LABBENZ PPS 03/31/2014 1644   LABBENZ  Value: POSITIVE (NOTE) Result repeated and verified. Sent for confirmatory testing* 04/29/2010 1714   AMPHETMU NONE DETECTED 05/29/2014 1651   AMPHETMU NEGATIVE 04/29/2010 [place] DETECTED 05/29/2014 1651   THCU  21* 12/07/2011 1644   LABBARB NONE DETECTED 05/29/2014 1651   LABBARB NEG 03/31/2014 1644    Alcohol Level: No results found for this basename: ETH,  in the last 168 hours Urinalysis:  Recent Labs Lab 05/27/14 [place] 1.011  PHURINE 6.0  GLUCOSEU NEGATIVE  HGBUR NEGATIVE  BILIRUBINUR NEGATIVE  KETONESUR NEGATIVE  PROTEINUR NEGATIVE  UROBILINOGEN 1.0  NITRITE NEGATIVE  LEUKOCYTESUR TRACE*    Micro Results: Recent Results (from the past 240 hour(s))  CULTURE, GROUP A STREP     Status: None   Collection Time    05/26/14  7:47 PM      Result Value Ref Range Status   Specimen Description THROAT   Final   Special Requests NONE   Final   Culture     Final   Value: No Beta Hemolytic Streptococci Isolated      Performed at [facility]   Report Status 05/28/2014 FINAL   Final  URINE CULTURE     Status: None   Collection Time    05/27/14  2:00 PM      Result Value Ref Range Status   Specimen Description URINE, CLEAN CATCH   Final   Special Requests NONE   Final   Culture  Setup Time     Final   Value: 05/27/2014 14:23     Performed at [facility]     Final   Value: >=100,000 COLONIES/ML     Performed at [facility]   Culture     Final   Value: Multiple bacterial morphotypes present, none predominant. Suggest appropriate recollection if clinically indicated.     Performed at [facility]   Report Status 05/28/2014 FINAL   Final   Studies/Results: Dg Chest 2 View  05/29/2014   CLINICAL DATA:  53 year old female with acute on chronic shortness of breath and epigastric pressure. Initial encounter.  EXAM: CHEST  2 VIEW  COMPARISON:  02/21/2014 and earlier.  FINDINGS: Seated AP and lateral views of the chest. Chronic large lung volumes. Normal cardiac size and mediastinal contours. Visualized tracheal air column is within normal limits. No pneumothorax, pulmonary edema, pleural effusion or acute pulmonary opacity. No acute osseous abnormality identified.  IMPRESSION: No acute cardiopulmonary abnormality.   Electronically Signed   By: Lars Pinks M.D.   On: 05/29/2014 15:07   Medications: I have reviewed the patient's current medications. Scheduled Meds: . budesonide-formoterol  2 puff Inhalation BID  . ciprofloxacin  500 mg Oral BID  . enoxaparin (LOVENOX) injection  30 mg Subcutaneous Q24H  . fentaNYL  75 mcg Transdermal Q72H  . lactose free nutrition  237 mL Oral TID BM  . metroNIDAZOLE  500 mg Oral BID  . morphine  30 mg Oral Daily  . pantoprazole  80 mg Oral Q1200  . sodium chloride  3 mL Intravenous Q12H   Continuous Infusions: . sodium chloride 75 mL/hr at 05/29/14 2336   PRN Meds:.acetaminophen, acetaminophen, ALPRAZolam,  ipratropium-albuterol, polyethylene glycol, promethazine Assessment/Plan: [name] is a 53yo woman w/ PMHx of COPD, depression, anxiety, GERD, polysubstance abuse with history of opiate overdose, and smoking who presented to the ED with chest pain likely 2/2 anxiety.   1. Chest Pain likely 2/2 Anxiety: Patient reports chest discomfort has improved this morning. Troponins negative x 3. Repeat EKG this morning unchanged and shows no evidence of ischemia. Cardiology saw her this morning and believe she does not have any  evidence of ischemia and requires no further inpatient or outpatient work up. Patient will be discharged home today.   2. Significant Weight Loss/Severe Malnutrition: Patient reports 40 lb weight loss over last year. Per records, her low weight has been ongoing since 2010. Patient does have a significant smoking history and malignancy should be further worked up as outpatient. Will set up appointment in IM clinic. Nutrition evaluated patient and recommended Boost TID in between meals. - HIV pending - d/c with Boost prescription  3. Leukopenia: Patient with WBC count 2.8 on admission. Differential showed elevated neutrophils at 84% and low absolute lymphs at 0.4. This morning WBC count 7.7. Patient will need repeat CBC at outpatient visit. Concerning given significant weight loss and need for work up for malignancy. - HIV pending  4. Lactic Acidosis: Patient found to have lactic acid of 2.29 on admission. On repeat was 2.3.  Repeat ABG was normal with pH 7.4, pCO2 38, pO2 89, bicarb 23, and O2 sat 97%. Likely due to albuterol as beta agonists have been associated with elevating lactic acid. - IVF @ 75 ml/hr   5. Hypokalemia- Resolved: K 3.0 on admission. Repleted in the ED. K was 4.6 this morning. Pt will need bmet at outpatient visit.   6. Mild Colitis: Patient in ED 2 days ago for abdominal pain. CT Abd/Pelvis showed evidence of mild colitis. She was continued on her Cipro and  Flagyl that was prescribed by the ED. Patient reporting some abdominal today. Will d/c with 7 day course of Cipro and Flagyl.  - Continue Cipro 500 mg BID  - Continue Flagyl 500 mg BID   7. COPD Gold Stage 3: PFTs from 09/18/09 show FEV1/FVC= 59%, FEV1= 36% predicted. Patient takes Symbicort BID at home. Hypoxia resolved on ABG this morning.  - Continue home Symbicort  - Albuterol nebs Q4H PRN  - Oxygen as needed   8. Depression/Anxiety: Patient has met criteria for major depressive disorder per clinic notes. However, patient has been reluctant to receive psychiatric help. She takes Xanax at home for anxiety.  - Continue Xanax as below   9. Polysubstance Abuse: Patient has long-standing history of narcotic abuse as well as marijuana. She has a history of opiate overdose. Per clinic notes, it seems this patient has been uncooperative with seeing psychiatry and getting help for her depression. She takes Xanax 1 mg BID, Fentanyl patch 75 mcg every 3 days, and Opana 10 mg daily. She takes Fentanyl and Opana for her chronic back and leg pain. Her Xanax was recently titrated down to 1 mg BID from 2 mg four times daily. Patient reported last Xanax was on morning of admission. However, UDS only + for opiates, which is appropriate.  - Continue Xanax 1 mg BID  - Restart Opana  - Continue Fentanyl patch   10. GERD: Patient takes Nexium 40 mg daily at home.  - Protonix 40 mg daily   Diet: Regular  DVT/PE PPx: Lovenox SQ  Dispo: Discharge likely today  The patient does have a current PCP (Jones Bales, MD) and does need an [facility] hospital follow-up appointment after discharge.  The patient does not have transportation limitations that hinder transportation to clinic appointments.  .Services Needed at time of discharge: Y = Yes, Blank = No PT:   OT:   RN:   Equipment:   Other:     LOS: 1 day   Albin Felling, MD 05/30/2014, 11:48 AM

## 2014-05-30 NOTE — Progress Notes (Signed)
Pt seen and examined with Dr. Arcelia Jew  In brief, pt is a 53 y/o female with PMH of COPD, depression, anxiety, GERD, polysubstance abuse who presents with CP. Pt states that CP was pressure lke, sub sternal, non radiating, 9/10 at it's worst, assoc with palpitations and SOB. Pt also reports that her PCP has recently been tapering her anxiety and pain medications. She was recently seen in ED and diagnosed with mild colitis and discharged on Cipro and Flagyl. No diaphoresis, no abd pain, no lightheadedness, no syncope. Remaining ROS negative  Exam: Gen: AAO*3, NAD CVS: RRR, normal heart sounds Pulm: CTA b/l Abd: soft, non tender, BS + Ext: no pedal edema  Assessment and Plan: 53 y/o female p/w CP likely secondary to anxiety  CP: - EKG with no acute ST/T wave changes - Troponins negative. Cardio recommend no further w/u  Severe malnutrition: - f.u HIV test - Encourage increased caloric intake - Will need outpatient w/u for possible malignancy  Colitis: - Pt with recently diagnosed colitis - complete PO cipro and flagyl course  COPD: - c/w home symbicort - stable. Outpatient f/u  Pt stable for d/c home today

## 2014-05-30 NOTE — Progress Notes (Signed)
UR completed 

## 2014-05-30 NOTE — Evaluation (Signed)
Physical Therapy Evaluation Patient Details Name: [name] MRN: [id number] DOB: [birth year] Today's Date: 05/30/2014   History of Present Illness    [name] is a 53 y.o.female. She is admitted with abdominal pain and some chest discomfort. I'm seeing her in consultation as part of the early morning cardiology evaluation of patient's on the chest pain unit. The patient has a multitude of medical problems. These include significant polysubstance abuse. There is a history of COPD and depression. She has normal left ventricular function by history in 2011. This was assessed by echocardiography. Recently there is been a question of colitis. She is now admitted with multiple complaints. EKGs are normal. Troponins are normal.   Clinical Impression  Pt currently with functional limitations due to weakness, pain, and instability. Pt will benefit from skilled PT to increase her independence and safety with mobility to allow discharge to home with home health. PT recommended discharge to a SNF due to Pt's lack of support and assistance at home but Pt refused this option. PT believes that Pt will be able to ambulate at home with the use of a RW but due to frailty and generalized weakness may fall in uncontrolled environment. Pt has stated that she does not feel comfortable asking for assistance once home.      Follow Up Recommendations Home health PT (SNF was recommended by PT but patient refused )    Equipment Recommendations  Rolling walker with 5" wheels    Recommendations for Other Services OT consult     Precautions / Restrictions Precautions Precautions: None Restrictions Weight Bearing Restrictions: No      Mobility  Bed Mobility Overal bed mobility: Independent             General bed mobility comments: Pt was able to go from supine to sit safely without assistance   Transfers Overall transfer level: Needs assistance Equipment used: None Transfers: Sit to/from  Stand Sit to Stand: Min guard         General transfer comment: Pt able to stand from lowered bed with assistance but had some unsteadiness and required min guard for safety.   Ambulation/Gait Ambulation/Gait assistance: Min guard Ambulation Distance (Feet): 10 Feet Assistive device: None Gait Pattern/deviations: Decreased stride length;Step-through pattern   Gait velocity interpretation: Below normal speed for age/gender General Gait Details: Pt was able to ambulate short distance without AD but was unsteady with ambulation. Pt tried ambulation a few steps using RW to assess ability to discharge to home safely. Pt was able to ambulate using RW with minimal cuing in controlled environment.   Stairs            Wheelchair Mobility    Modified Rankin (Stroke Patients Only)       Balance Overall balance assessment: Needs assistance Sitting-balance support: Feet supported;No upper extremity supported Sitting balance-Leahy Scale: Good     Standing balance support: Single extremity supported;During functional activity Standing balance-Leahy Scale: Fair Standing balance comment: Pt able to stand unsupported but was unsteady and weak.                             Pertinent Vitals/Pain Pain Assessment: 0-10 Pain Score: 8  Pain Location: Back, Legs Pain Descriptors / Indicators: Constant Pain Intervention(s): Monitored during session;Limited activity within patient's tolerance;Repositioned;Patient requesting pain meds-RN notified Pt's SaO2 remained at 98% prior to transfer from bed to chair and once resting in chair. Pt was on RA  throughout session.     Home Living Family/patient expects to be discharged to:: Private residence Living Arrangements: Alone Available Help at Discharge: Family (Potentially nephew may be able to help) Type of Home: House Home Access: Stairs to enter Entrance Stairs-Rails: Right Entrance Stairs-Number of Steps: 3 Home Layout: One  level Home Equipment: Cane - single point (AFO) Additional Comments: Has oxygen at home that she is used to use at night but she doesn't; suppose to be at 3L    Prior Function Level of Independence: Independent               Hand Dominance   Dominant Hand: Right    Extremity/Trunk Assessment   Upper Extremity Assessment: Overall WFL for tasks assessed           Lower Extremity Assessment: Generalized weakness         Communication   Communication: No difficulties  Cognition Arousal/Alertness: Awake/alert Behavior During Therapy: Restless;Anxious;Flat affect Overall Cognitive Status: Within Functional Limits for tasks assessed                      General Comments General comments (skin integrity, edema, etc.): Pt frail and generally weak; Pt was emotional throughout session.    Exercises        Assessment/Plan    PT Assessment Patient needs continued PT services  PT Diagnosis Generalized weakness   PT Problem List Decreased strength;Decreased activity tolerance;Decreased balance;Decreased mobility  PT Treatment Interventions Balance training;Therapeutic activities;Functional mobility training;DME instruction   PT Goals (Current goals can be found in the Care Plan section) Acute Rehab PT Goals Patient Stated Goal: Return to home to her dog PT Goal Formulation: With patient Time For Goal Achievement: 06/13/14 Potential to Achieve Goals: Good    Frequency Min 3X/week   Barriers to discharge Decreased caregiver support Pt lives independently with her dog and stated that she does not like to ask for help. Pt strongly stated she did not want to go to a SNF at discharge.     Co-evaluation               End of Session Equipment Utilized During Treatment: Gait belt Activity Tolerance: Patient limited by pain;Patient limited by fatigue Patient left: in chair;with call bell/phone within reach Nurse Communication: Patient requests pain meds  (Nurse called at end of session; said she'd be down shortly)    Functional Assessment Tool Used: Clinical Judgement  Functional Limitation: Mobility: Walking and moving around Mobility: Walking and Moving Around Current Status (Q4696): At least 20 percent but less than 40 percent impaired, limited or restricted Mobility: Walking and Moving Around Goal Status [phone]): 0 percent impaired, limited or restricted    Time: 0950-1027 PT Time Calculation (min): 37 min   Charges:         PT G Codes:   Functional Assessment Tool Used: Clinical Judgement  Functional Limitation: Mobility: Walking and moving around    [place], SPT 05/30/2014, 10:56 AM [facility] [phone] (pager)

## 2014-05-30 NOTE — Care Management Note (Signed)
    Page 1 of 1   05/30/2014     1:13:58 PM CARE MANAGEMENT NOTE 05/30/2014  Patient:  [name], [name]   Account Number:  [id number]  Date Initiated:  05/30/2014  Documentation initiated by:  GRAVES-BIGELOW,Jaquasha  Subjective/Objective Assessment:   Pt in with weakness and chest pain.     Action/Plan:   Plan for d/c today with [facility] services. Referral made with [facility] for services. SOC to begin iwthin 24-48 hrs post d/c. No further needs from CM at this time.   Anticipated DC Date:  05/30/2014   Anticipated DC Plan:  [place]  CM consult      [facility] Choice  DURABLE MEDICAL EQUIPMENT   Choice offered to / List presented to:  C-1 Patient   DME arranged  Vassie Moselle      DME agency  [place] arranged  [place].   Status of service:  Completed, signed off Medicare Important Message given?  NO (If response is "NO", the following Medicare IM given date fields will be blank) Date Medicare IM given:   Medicare IM given by:   Date Additional Medicare IM given:   Additional Medicare IM given by:    Discharge Disposition:  Rule  Per UR Regulation:  Reviewed for med. necessity/level of care/duration of stay  If discussed at [place] of Stay Meetings, dates discussed:    Comments:

## 2014-05-30 NOTE — Progress Notes (Signed)
Occupational Therapy Evaluation and Discharge Patient Details Name: [name] MRN: [id number] DOB: May 27, 1961 Today's Date: 05/30/2014    History of Present Illness [name] is a 53 y.o. Female admitted 05/29/14 with chest pain. PMH of COPD, depression, anxiety, GERD, and polysubstance abuse with hx of opiate overdose.    Clinical Impression   PTA pt lived at home alone and was independent with ADLs. Pt currently requires min guard/supervision for ADLs and functional mobility due to mild balance deficits. Education completed with pt regarding fall prevention and energy conservation. Pt plans to d/c home today and no further acute OT needs.     Follow Up Recommendations  No OT follow up;Supervision/Assistance - 24 hour    Equipment Recommendations  None recommended by OT    Recommendations for Other Services       Precautions / Restrictions Restrictions Weight Bearing Restrictions: No      Mobility Bed Mobility Overal bed mobility: Independent                Transfers Overall transfer level: Needs assistance Equipment used: None Transfers: Sit to/from Stand Sit to Stand: Min guard;Supervision         General transfer comment: Min guard for safety due to LE weakness. No LOB    Balance Overall balance assessment: Needs assistance Sitting-balance support: Feet supported;No upper extremity supported Sitting balance-Leahy Scale: Good     Standing balance support: No upper extremity supported;During functional activity Standing balance-Leahy Scale: Fair Standing balance comment: Pt able to pull up pants and clasp button in standing with no LOB but cannot tolerate challenges.                            ADL Overall ADL's : Needs assistance/impaired                                       General ADL Comments: Overall Pt requires Min G/Supervision for ADLs due to generalized weakness. Educated pt on energy conservation and  fall prevention. Pt plans to d/c home today. Encouraged pt to have Supervision at home and to keep her phone with her when OOB/ambulating.       Vision  Pt reports no change from baseline                    Perception Perception Perception Tested?: No   Praxis Praxis Praxis tested?: Within functional limits    Pertinent Vitals/Pain Pain Assessment: 0-10 Pain Score: 5  Pain Location: stomach Pain Descriptors / Indicators: Nagging Pain Intervention(s): Limited activity within patient's tolerance;Monitored during session;Repositioned     Hand Dominance Right   Extremity/Trunk Assessment Upper Extremity Assessment Upper Extremity Assessment: Overall WFL for tasks assessed   Lower Extremity Assessment Lower Extremity Assessment: Generalized weakness   Cervical / Trunk Assessment Cervical / Trunk Assessment: Normal   Communication Communication Communication: No difficulties   Cognition Arousal/Alertness: Awake/alert Behavior During Therapy: Restless;Anxious;Flat affect Overall Cognitive Status: Within Functional Limits for tasks assessed                                Home Living Family/patient expects to be discharged to:: Private residence Living Arrangements: Alone Available Help at Discharge: Family;Available PRN/intermittently (pt's daughter and nephew may be able to assist) Type of Home:  House Home Access: Stairs to enter Technical brewer of Steps: 3 Entrance Stairs-Rails: Right Home Layout: One level               Home Equipment: Cane - single point (AFO)   Additional Comments: Has oxygen at home that she is used to use at night but she doesn't; suppose to be at 3L      Prior Functioning/Environment Level of Independence: Independent             OT Diagnosis: Generalized weakness                          End of Session  Activity Tolerance: Patient tolerated treatment well Patient left: in bed;with call  bell/phone within reach   Time: 1201-1222 OT Time Calculation (min): 21 min Charges:  OT General Charges $OT Visit: 1 Procedure OT Evaluation $Initial OT Evaluation Tier I: 1 Procedure OT Treatments $Self Care/Home Management : 8-22 mins G-Codes: OT G-codes **NOT FOR INPATIENT CLASS** Functional Assessment Tool Used: Clinical judgement Functional Limitation: Self care Self Care Current Status (X7939): At least 1 percent but less than 20 percent impaired, limited or restricted Self Care Goal Status (Q3009): At least 1 percent but less than 20 percent impaired, limited or restricted Self Care Discharge Status [phone]): At least 1 percent but less than 20 percent impaired, limited or restricted  Juluis Rainier 05/30/2014, 1:21 PM  Cyndie Chime, OTR/L Occupational Therapist [phone] (pager)

## 2014-05-30 NOTE — Consult Note (Signed)
CARDIOLOGY CONSULT NOTE   Patient ID: [name] MRN: [id number] DOB/AGE: [birth year] 53 y.o.  Admit Date: 05/29/2014 Referring Physician:  Primary Physician: Michail Jewels, MD  Primary Cardiologist   None  Clinical Summary [name] is a 53 y.o.female. She is admitted with abdominal pain and some chest discomfort. I'm seeing her in consultation as part of the early morning cardiology evaluation of patient's on the chest pain unit. The patient has a multitude of medical problems. These include significant polysubstance abuse. There is a history of COPD and depression. She has normal left ventricular function by history in 2011. This was assessed by echocardiography. Recently there is been a question of colitis. She is now admitted with multiple complaints. EKGs are normal. Troponins are normal.   Allergies  Allergen Reactions  . Aripiprazole Other (See Comments)    Suicidal ideation, also with SSRI  . Chantix [Varenicline] Other (See Comments)    Irritability and depressed mood  . Cymbalta [Duloxetine Hcl] Other (See Comments)    Irritability. 3/11  . Pregabalin Hives    On trunk of body  . Zolpidem Tartrate Other (See Comments)    unknown    Medications Scheduled Medications: . budesonide-formoterol  2 puff Inhalation BID  . ciprofloxacin  500 mg Oral BID  . enoxaparin (LOVENOX) injection  30 mg Subcutaneous Q24H  . fentaNYL  75 mcg Transdermal Q72H  . metroNIDAZOLE  500 mg Oral BID  . morphine  30 mg Oral Daily  . pantoprazole  80 mg Oral Q1200  . sodium chloride  1,000 mL Intravenous Once  . sodium chloride  3 mL Intravenous Q12H     Infusions: . sodium chloride 75 mL/hr at 05/29/14 2336     PRN Medications:  acetaminophen, acetaminophen, ALPRAZolam, ipratropium-albuterol, polyethylene glycol   Past Medical History  Diagnosis Date  . Anxiety   . Depression   . Cervical cancer     Discovered by pap smear, treated by Dr. Charlesetta Garibaldi with LEEP  procedure.  Pap since that time has been normal.  . Chronic back pain      her CT of the head without contrast June 28, 2008- moderate to large right paracentral disc protrusion at C4-C5 resulting in a right C5 neuroforaminal stenosis and probably some degree of spinal stenosis. C. bilateral C6 neuroforaminal stenosis related to  degenerative hypertrophy, no acute fracture or loose pieces identifying the cervical spine, ligamentous injury is not excluded.    Marland Kitchen Headache(784.0)   . COPD (chronic obstructive pulmonary disease)   . Arthritis   . GERD (gastroesophageal reflux disease)  October 2011     Williams, esophagogram - tiny amount of gastroesophageal reflux otherwise unremarkable exam, done by Dr. Dellis Filbert  . Osteoporosis     T score L femur neck = -2.6 (02/21/12)  . Fibromyalgia   . Hemorrhoids     Past Surgical History  Procedure Laterality Date  . Cervical cone biopsy      leep procedure  . Multiple extractions with alveoloplasty  06/18/2012    Procedure: MULTIPLE EXTRACION WITH ALVEOLOPLASTY;  Surgeon: Gae Bon, DDS;  Location: [facility];  Service: Oral Surgery;  Laterality: Bilateral;    Family History  Problem Relation Age of Onset  . Stroke Mother   . Diabetes Mother   . Heart disease Father   . Lung cancer Daughter     Social History [name] reports that she has been smoking Cigarettes.  She has a 34 pack-year smoking history.  She has never used smokeless tobacco. [name] reports that she does not drink alcohol.  Review of Systems  Patient denies fever, chills, headache, sweats, rash, change in vision, change in hearing, urinary symptoms. All other systems are reviewed and are negative. Other than the history of present illness.  Physical Examination Blood pressure 97/61, pulse 89, temperature 98.6 F (37 C), temperature source Oral, resp. rate 16, height 5\' 7"  (1.702 m), weight 92 lb 1.6 oz (41.776 kg), SpO2 97.00%.  Intake/Output Summary (Last 24 hours)  at 05/30/14 0814 Last data filed at 05/30/14 0500  Gross per 24 hour  Intake   1765 ml  Output      0 ml  Net   1765 ml   The patient is cachectic. She says she's having some abdominal discomfort at this time. She is oriented to person time and place. Her affect appears depressed. Head is atraumatic. Sclera and conjunctiva are normal. There is no jugulovenous distention. Lungs reveal a few scattered rhonchi. There is no respiratory distress. Cardiac exam reveals S1 and S2. The abdomen is soft. There is no peripheral edema. There are no musculoskeletal deformities. There are no skin rashes.  Prior Cardiac Testing/Procedures  Lab Results  Basic Metabolic Panel:  Recent Labs Lab 05/26/14 2015 05/26/14 2055 05/27/14 1233 05/29/14 1417  NA 135* 137 137 141  K 4.3 3.7 4.1 3.0*  CL 97 94* 97 101  CO2  --  29 26 27   GLUCOSE 99 97 85 128*  BUN 15 15 11 6   CREATININE 0.90 0.75 0.59 0.66  CALCIUM  --  9.7 9.2 8.9    Liver Function Tests:  Recent Labs Lab 05/26/14 2055 05/27/14 1233 05/29/14 1417  AST 15 14 13   ALT 7 6 6   ALKPHOS 94 82 77  BILITOT 0.4 0.4 0.4  PROT 8.4* 7.4 6.8  ALBUMIN 4.3 3.7 3.6    CBC:  Recent Labs Lab 05/26/14 2015 05/26/14 2055 05/27/14 1233 05/29/14 1417  WBC  --  5.9 3.8* 2.8*  NEUTROABS  --  2.7 1.6* 2.3  HGB 16.7* 14.4 13.3 13.1  HCT 49.0* 41.9 39.5 38.8  MCV  --  94.2 94.5 95.1  PLT  --  237 204 178    Cardiac Enzymes:  Recent Labs Lab 05/29/14 2136 05/30/14 0310  TROPONINI <0.30 <0.30    BNP: No components found with this basename: POCBNP,    Radiology: Dg Chest 2 View  05/29/2014   CLINICAL DATA:  53 year old female with acute on chronic shortness of breath and epigastric pressure. Initial encounter.  EXAM: CHEST  2 VIEW  COMPARISON:  02/21/2014 and earlier.  FINDINGS: Seated AP and lateral views of the chest. Chronic large lung volumes. Normal cardiac size and mediastinal contours. Visualized tracheal air column is  within normal limits. No pneumothorax, pulmonary edema, pleural effusion or acute pulmonary opacity. No acute osseous abnormality identified.  IMPRESSION: No acute cardiopulmonary abnormality.   Electronically Signed   By: Lars Pinks M.D.   On: 05/29/2014 15:07     ECG:  I have reviewed current and old EKGs. The EKGs are normal. There is no change from the past.  Telemetry:   I have reviewed telemetry today May 30, 2014. There is normal sinus rhythm.   Impression and Recommendations     Polysubstance dependence including opioid drug with daily use     The primary care team will continue to work with this patient concerning all of these issues.    Atypical  chest pain    At this time there is no evidence that the patient is having cardiac ischemia. I recommend no further cardiac workup. She does not need outpatient cardiology follow-up either.       Abdominal pain    This seems to be her major complaint at this time. This will be assessed fully by the primary team.  Overall the patient has multiple issues. I feel that her cardiac status is stable. I recommend no further cardiac workup.    Daryel November, MD 05/30/2014, 8:14 AM

## 2014-06-02 ENCOUNTER — Other Ambulatory Visit: Payer: Self-pay | Admitting: Internal Medicine

## 2014-06-03 ENCOUNTER — Emergency Department (HOSPITAL_COMMUNITY): Payer: PRIVATE HEALTH INSURANCE

## 2014-06-03 ENCOUNTER — Encounter (HOSPITAL_COMMUNITY): Payer: Self-pay | Admitting: Physical Medicine and Rehabilitation

## 2014-06-03 ENCOUNTER — Emergency Department (HOSPITAL_COMMUNITY)
Admission: EM | Admit: 2014-06-03 | Discharge: 2014-06-03 | Disposition: A | Discharge status: Home / Self Care | Payer: PRIVATE HEALTH INSURANCE | Attending: Emergency Medicine | Admitting: Emergency Medicine

## 2014-06-03 DIAGNOSIS — Z720 Tobacco use: Secondary | ICD-10-CM | POA: Diagnosis not present

## 2014-06-03 DIAGNOSIS — R630 Anorexia: Secondary | ICD-10-CM | POA: Insufficient documentation

## 2014-06-03 DIAGNOSIS — R110 Nausea: Secondary | ICD-10-CM | POA: Insufficient documentation

## 2014-06-03 DIAGNOSIS — Z8541 Personal history of malignant neoplasm of cervix uteri: Secondary | ICD-10-CM | POA: Diagnosis not present

## 2014-06-03 DIAGNOSIS — M549 Dorsalgia, unspecified: Secondary | ICD-10-CM | POA: Insufficient documentation

## 2014-06-03 DIAGNOSIS — K219 Gastro-esophageal reflux disease without esophagitis: Secondary | ICD-10-CM | POA: Insufficient documentation

## 2014-06-03 DIAGNOSIS — M1990 Unspecified osteoarthritis, unspecified site: Secondary | ICD-10-CM | POA: Diagnosis not present

## 2014-06-03 DIAGNOSIS — R1032 Left lower quadrant pain: Secondary | ICD-10-CM | POA: Diagnosis not present

## 2014-06-03 DIAGNOSIS — R1012 Left upper quadrant pain: Secondary | ICD-10-CM | POA: Insufficient documentation

## 2014-06-03 DIAGNOSIS — J449 Chronic obstructive pulmonary disease, unspecified: Secondary | ICD-10-CM | POA: Diagnosis not present

## 2014-06-03 DIAGNOSIS — F329 Major depressive disorder, single episode, unspecified: Secondary | ICD-10-CM | POA: Insufficient documentation

## 2014-06-03 DIAGNOSIS — Z7951 Long term (current) use of inhaled steroids: Secondary | ICD-10-CM | POA: Diagnosis not present

## 2014-06-03 DIAGNOSIS — F419 Anxiety disorder, unspecified: Secondary | ICD-10-CM | POA: Diagnosis not present

## 2014-06-03 DIAGNOSIS — G8929 Other chronic pain: Secondary | ICD-10-CM | POA: Diagnosis not present

## 2014-06-03 DIAGNOSIS — R109 Unspecified abdominal pain: Secondary | ICD-10-CM

## 2014-06-03 DIAGNOSIS — Z79899 Other long term (current) drug therapy: Secondary | ICD-10-CM | POA: Insufficient documentation

## 2014-06-03 LAB — COMPREHENSIVE METABOLIC PANEL
ALT: 19 U/L (ref 0–35)
AST: 28 U/L (ref 0–37)
Albumin: 3.9 g/dL (ref 3.5–5.2)
Alkaline Phosphatase: 66 U/L (ref 39–117)
Anion gap: 14 (ref 5–15)
BILIRUBIN TOTAL: 0.5 mg/dL (ref 0.3–1.2)
BUN: 11 mg/dL (ref 6–23)
CALCIUM: 9.2 mg/dL (ref 8.4–10.5)
CHLORIDE: 97 meq/L (ref 96–112)
CO2: 26 meq/L (ref 19–32)
Creatinine, Ser: 0.63 mg/dL (ref 0.50–1.10)
GLUCOSE: 100 mg/dL — AB (ref 70–99)
Potassium: 3.8 mEq/L (ref 3.7–5.3)
SODIUM: 137 meq/L (ref 137–147)
Total Protein: 6.9 g/dL (ref 6.0–8.3)

## 2014-06-03 LAB — URINALYSIS, ROUTINE W REFLEX MICROSCOPIC
BILIRUBIN URINE: NEGATIVE
GLUCOSE, UA: NEGATIVE mg/dL
HGB URINE DIPSTICK: NEGATIVE
KETONES UR: 15 mg/dL — AB
Nitrite: NEGATIVE
PROTEIN: NEGATIVE mg/dL
Specific Gravity, Urine: 1.009 (ref 1.005–1.030)
Urobilinogen, UA: 0.2 mg/dL (ref 0.0–1.0)
pH: 7 (ref 5.0–8.0)

## 2014-06-03 LAB — URINE MICROSCOPIC-ADD ON

## 2014-06-03 LAB — CBC WITH DIFFERENTIAL/PLATELET
Basophils Absolute: 0 10*3/uL (ref 0.0–0.1)
Basophils Relative: 1 % (ref 0–1)
EOS PCT: 1 % (ref 0–5)
Eosinophils Absolute: 0 10*3/uL (ref 0.0–0.7)
HEMATOCRIT: 40.9 % (ref 36.0–46.0)
Hemoglobin: 14.1 g/dL (ref 12.0–15.0)
LYMPHS ABS: 1.3 10*3/uL (ref 0.7–4.0)
LYMPHS PCT: 30 % (ref 12–46)
MCH: 32 pg (ref 26.0–34.0)
MCHC: 34.5 g/dL (ref 30.0–36.0)
MCV: 93 fL (ref 78.0–100.0)
Monocytes Absolute: 0.3 10*3/uL (ref 0.1–1.0)
Monocytes Relative: 7 % (ref 3–12)
Neutro Abs: 2.6 10*3/uL (ref 1.7–7.7)
Neutrophils Relative %: 61 % (ref 43–77)
PLATELETS: 230 10*3/uL (ref 150–400)
RBC: 4.4 MIL/uL (ref 3.87–5.11)
RDW: 13.5 % (ref 11.5–15.5)
WBC: 4.2 10*3/uL (ref 4.0–10.5)

## 2014-06-03 LAB — LIPASE, BLOOD: Lipase: 13 U/L (ref 11–59)

## 2014-06-03 MED ORDER — HYDROMORPHONE HCL 1 MG/ML IJ SOLN
1.0000 mg | Freq: Once | INTRAMUSCULAR | Status: AC
  Administered 2014-06-03: 1 mg via INTRAVENOUS
  Filled 2014-06-03: qty 1

## 2014-06-03 MED ORDER — HYDROMORPHONE HCL 1 MG/ML IJ SOLN
1.0000 mg | Freq: Once | INTRAMUSCULAR | Status: DC
  Filled 2014-06-03: qty 1

## 2014-06-03 NOTE — ED Notes (Signed)
Patient discharged at 1300. Patient refused Dilaudid before discharge after nurse had pulled the medication. Attempted to replaced medication in the pyxis at this time, but Pyxis no longer had patient admitted in the system. Called Pharmacy and Pharmacy stated to send the medication with label and note explaining the situation to the Pharmacist. Dilaudid taken to pharmacy and left with Pharmacist to return medication to stock.

## 2014-06-03 NOTE — ED Notes (Addendum)
PA Harris at bedside.  Patient stating that she is hurting but she is "agrivated" and does not want to be treated.  Pt is alert and oriented x4.  Patient tells staff to leave her alone, i'm cold.  Patient has 2 warm blankets and is attached to our monitoring system.  HR 77, O2 99%, 157/89 and 14 respirations.

## 2014-06-03 NOTE — ED Provider Notes (Signed)
CSN: [id number]     Arrival date & time 06/03/14  0820 History   First MD Initiated Contact with Patient 06/03/14 [phone]     Chief Complaint  Patient presents with  . Abdominal Pain     (Consider location/radiation/quality/duration/timing/severity/associated sxs/prior Treatment) Patient is a 53 y.o. female presenting with abdominal pain.  Abdominal Pain Pain location:  LUQ and LLQ Pain quality: aching and cramping   Pain radiates to:  Does not radiate Pain severity:  Severe Onset quality:  Sudden Duration: chronic, acute this morning. Timing:  Constant Progression:  Worsening Chronicity:  Recurrent Context: awakening from sleep   Context: not diet changes and not eating   Context comment:  Chronic opioid use Relieved by: phenergan. Associated symptoms: anorexia, fatigue and nausea   Associated symptoms: no belching, no chills, no fever, no melena, no shortness of breath and no vomiting  Constipation: unsure.    [name] is a(n) 53 y.o. female who presents to the emergency department with chief complaint of abdominal pain. She is brought in by EMS. She has a past medical history of anxiety, depression, chronic back pain, chronic abdominal pain, headaches, COPD and fibromyalgia. She was seen frequently for this same complaint in our emergency department.She has a history of chronic narcotic use including daily Opana and fentanyl. 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 Patient had sudden onset of severe lower abdominal pain this morning. She states she has a history of colitis however I'm unable to find this diagnosis in her chart reviewed. Complains of severe, 8 out of 10, lower abdominal pain. She had some associated nausea this morning and took a phenergan this morning with resolution of her nausea. Patient states that she has had decreased appetitite over the past few days and she is weak and cannot  walk. Denies fevers, chills, myalgias, arthralgias. Denies DOE, SOB, chest tightness or pressure, radiation to left arm, jaw or back, or diaphoresis. Denies dysuria, flank pain, suprapubic pain, frequency, urgency, or hematuria. Denies headaches, light headedness, weakness, visual disturbances.  Past Medical History  Diagnosis Date  . Anxiety   . Depression   . Cervical cancer     Discovered by pap smear, treated by Dr. Charlesetta Garibaldi with LEEP procedure.  Pap since that time has been normal.  . Chronic back pain      her CT of the head without contrast June 28, 2008- moderate to large right paracentral disc protrusion at C4-C5 resulting in a right C5 neuroforaminal stenosis and probably some degree of spinal stenosis. C. bilateral C6 neuroforaminal stenosis related to  degenerative hypertrophy, no acute fracture or loose pieces identifying the cervical spine, ligamentous injury is not excluded.    Marland Kitchen Headache(784.0)   . COPD (chronic obstructive pulmonary disease)   . Arthritis   . GERD (gastroesophageal reflux disease)  October 2011     Williams, esophagogram - tiny amount of gastroesophageal reflux otherwise unremarkable exam, done by Dr. Dellis Filbert  . Osteoporosis     T score L femur neck = -2.6 (02/21/12)  . Fibromyalgia   . Hemorrhoids    Past Surgical History  Procedure Laterality Date  . Cervical cone biopsy      leep procedure  . Multiple extractions with alveoloplasty  06/18/2012    Procedure: MULTIPLE EXTRACION WITH ALVEOLOPLASTY;  Surgeon: Gae Bon, DDS;  Location: [facility];  Service: Oral Surgery;  Laterality: Bilateral;   Family History  Problem Relation Age of  Onset  . Stroke Mother   . Diabetes Mother   . Heart disease Father   . Lung cancer Daughter    History  Substance Use Topics  . Smoking status: Current Every Day Smoker -- 1.00 packs/day for 34 years    Types: Cigarettes  . Smokeless tobacco: Never Used     Comment: hx drugs non since 95/ TRYING TO QUITS/ HAS  CUT BACK  . Alcohol Use: No   OB History    No data available     Review of Systems  Constitutional: Positive for appetite change and fatigue. Negative for fever and chills.  Respiratory: Negative for shortness of breath.   Gastrointestinal: Positive for nausea, abdominal pain (lower) and anorexia. Negative for vomiting, blood in stool and melena. Constipation: unsure.  Musculoskeletal: Positive for back pain (chronic).  Skin: Negative for rash.  Neurological: Positive for weakness.  Psychiatric/Behavioral: Positive for agitation. The patient is nervous/anxious.   All other systems reviewed and are negative.   Ten systems reviewed and are negative for acute change, except as noted in the HPI.    Allergies  Aripiprazole; Chantix; Cymbalta; Pregabalin; and Zolpidem tartrate  Home Medications   Prior to Admission medications   Medication Sig Start Date End Date Taking? Authorizing Provider  ALPRAZolam Duanne Moron) 1 MG tablet Take 1 mg by mouth 2 (two) times daily. This must last 30 days 05/16/14   Bartholomew Crews, MD  budesonide-formoterol [facility]) 160-4.5 MCG/ACT inhaler Inhale 2 puffs into the lungs 2 (two) times daily. 01/08/14   Hester Mates, MD  ciprofloxacin (CIPRO) 500 MG tablet Take 1 tablet (500 mg total) by mouth 2 (two) times daily. One po bid x 7 days 05/27/14   [place]a Blade, MD  esomeprazole (NEXIUM) 40 MG capsule Take 1 capsule (40 mg total) by mouth daily. 01/08/14   Hester Mates, MD  fentaNYL (DURAGESIC - DOSED MCG/HR) 75 MCG/HR Place 75 mcg onto the skin every 3 (three) days. 05/21/14   Jones Bales, MD  HYDROcodone-acetaminophen (NORCO/VICODIN) 5-325 MG per tablet  05/27/14   Historical Provider, MD  metroNIDAZOLE (FLAGYL) 500 MG tablet Take 1 tablet (500 mg total) by mouth 2 (two) times daily. One po bid x 7 days 05/27/14   [place]a Blade, MD  oxymorphone (OPANA) 10 MG tablet Take 10 mg by mouth daily. 05/21/14   Jones Bales, MD  polyethylene glycol  (MIRALAX / GLYCOLAX) packet Take 17 g by mouth daily as needed for mild constipation.    Historical Provider, MD  polyethylene glycol powder (GLYCOLAX/MIRALAX) powder  05/28/14   Historical Provider, MD  Promethazine HCl (PHENERGAN PO) Take 1 tablet by mouth every 8 (eight) hours as needed (nausea).    Historical Provider, MD   BP 157/89 mmHg  Pulse 79  Temp(Src) 98.1 F (36.7 C) (Oral)  Resp 17  SpO2 99% Physical Exam  Constitutional: She is oriented to person, place, and time. She appears well-developed and well-nourished. No distress.  Thin, protein malnourished  HENT:  Head: Normocephalic and atraumatic.  Eyes: Conjunctivae are normal. No scleral icterus.  Neck: Normal range of motion.  Cardiovascular: Normal rate, regular rhythm and normal heart sounds.  Exam reveals no gallop and no friction rub.   No murmur heard. Pulmonary/Chest: Effort normal and breath sounds normal. No respiratory distress.  Abdominal: Soft. Bowel sounds are normal. She exhibits no distension and no mass. There is tenderness (lower). There is guarding.  scaphoid  Neurological: She is alert and  oriented to person, place, and time.  Patient is alert and oriented, but not answers "I don't Know" to many questions.  Skin: Skin is warm and dry. She is not diaphoretic.  Nursing note and vitals reviewed.     ED Course  Procedures (including critical care time) Labs Review Results for orders placed or performed during the hospital encounter of 06/03/14  CBC with Differential  Result Value Ref Range   WBC 4.2 4.0 - 10.5 K/uL   RBC 4.40 3.87 - 5.11 MIL/uL   Hemoglobin 14.1 12.0 - 15.0 g/dL   HCT 40.9 36.0 - 46.0 %   MCV 93.0 78.0 - 100.0 fL   MCH 32.0 26.0 - 34.0 pg   MCHC 34.5 30.0 - 36.0 g/dL   RDW 13.5 11.5 - 15.5 %   Platelets 230 150 - 400 K/uL   Neutrophils Relative % 61 43 - 77 %   Neutro Abs 2.6 1.7 - 7.7 K/uL   Lymphocytes Relative 30 12 - 46 %   Lymphs Abs 1.3 0.7 - 4.0 K/uL   Monocytes  Relative 7 3 - 12 %   Monocytes Absolute 0.3 0.1 - 1.0 K/uL   Eosinophils Relative 1 0 - 5 %   Eosinophils Absolute 0.0 0.0 - 0.7 K/uL   Basophils Relative 1 0 - 1 %   Basophils Absolute 0.0 0.0 - 0.1 K/uL     Imaging Review No results found.   EKG Interpretation   Date/Time:  Tuesday June 03 2014 09:43:07 EST Ventricular Rate:  74 PR Interval:  128 QRS Duration: 70 QT Interval:  408 QTC Calculation: 453 R Axis:   90 Text Interpretation:  Sinus rhythm Ventricular premature complex  Borderline right axis deviation No significant change since last tracing  Confirmed by Winston ([id number]) on 06/03/2014 9:50:14 AM      MDM   Final diagnoses:  Chronic abdominal pain    Patient here with complaints of abdominal pain. She is belligerent and noncompliant. Explained to the patient that we are trying to help her. Have ordered the patient pain medication. Patient is not sure she wants to stay. I have advised nursing staff to let the patient will often sit for a while. I will check back on the patient to see if she still would like a workup. I have also advised the patient that she is an adult and is capable of making her own decisions.  Patient is more relaxed. She is received her pain medication. Her pain appears to be chronic. She is more cooperative at this point. She is not confused or disoriented.  Patient labs without acute abnormality.negative abdominal radiographs. Her EKG is unchanged from previous. We'll ambulate the patient  12:36 PM Patient is ambulating around the ED asking to leave. She appears safe for discharge at this point.  Margarita Mail, PA-C 06/03/14 [place], MD 06/05/14 [phone]

## 2014-06-03 NOTE — ED Provider Notes (Signed)
Patient seen/examined in the Emergency Department in conjunction with Midlevel Provider [employer] Patient reports abdominal pain Exam : diffuse abdominal tenderness.  No rebound or guarding Plan: labs/imaging/EKG pending    Sharyon Cable, MD 06/03/14 [phone]

## 2014-06-03 NOTE — ED Notes (Signed)
Pt presents to department via [facility] for evaluation of chronic abdominal pain. Was seen previously for same. No relief with medications at home. 8/10 pain upon arrival. Pt is alert and oriented x4. NAD.

## 2014-06-03 NOTE — Discharge Instructions (Signed)
Chronic Pain Discharge Instructions  Emergency care providers appreciate that many patients coming to [place] are in severe pain and we wish to address their pain in the safest, most responsible manner.  It is important to recognize however, that the proper treatment of chronic pain differs from that of the pain of injuries and acute illnesses.  Our goal is to provide quality, safe, personalized care and we thank you for giving [place] the opportunity to serve you. The use of narcotics and related agents for chronic pain syndromes may lead to additional physical and psychological problems.  Nearly as many people die from prescription narcotics each year as die from car crashes.  Additionally, this risk is increased if such prescriptions are obtained from a variety of sources.  Therefore, only your primary care physician or a pain management specialist is able to safely treat such syndromes with narcotic medications long-term.    Documentation revealing such prescriptions have been sought from multiple sources may prohibit [place] from providing a refill or different narcotic medication.  Your name may be checked first through the [place].  This database is a record of controlled substance medication prescriptions that the patient has received.  This has been established by [employer] in an effort to eliminate the dangerous, and often life threatening, practice of obtaining multiple prescriptions from different medical providers.   If you have a chronic pain syndrome (i.e. chronic headaches, recurrent back or neck pain, dental pain, abdominal or pelvis pain without a specific diagnosis, or neuropathic pain such as fibromyalgia) or recurrent visits for the same condition without an acute diagnosis, you may be treated with non-narcotics and other non-addictive medicines.  Allergic reactions or negative side effects that may be reported by a patient to such medications will not  typically lead to the use of a narcotic analgesic or other controlled substance as an alternative.   Patients managing chronic pain with a personal physician should have provisions in place for breakthrough pain.  If you are in crisis, you should call your physician.  If your physician directs you to the emergency department, please have the doctor call and speak to our attending physician concerning your care.   When patients come to the Emergency Department (ED) with acute medical conditions in which the Emergency Department physician feels appropriate to prescribe narcotic or sedating pain medication, the physician will prescribe these in very limited quantities.  The amount of these medications will last only until you can see your primary care physician in his/her office.  Any patient who returns to the ED seeking refills should expect only non-narcotic pain medications.   In the event of an acute medical condition exists and the emergency physician feels it is necessary that the patient be given a narcotic or sedating medication -  a responsible adult driver should be present in the room prior to the medication being given by the nurse.   Prescriptions for narcotic or sedating medications that have been lost, stolen or expired will not be refilled in the Emergency Department.    Patients who have chronic pain may receive non-narcotic prescriptions until seen by their primary care physician.  It is every patients personal responsibility to maintain active prescriptions with his or her primary care physician or specialist. Abdominal (belly) pain can be caused by many things. Your caregiver performed an examination and possibly ordered blood/urine tests and imaging (CT scan, x-rays, ultrasound). Many cases can be observed and treated at home after  initial evaluation in the emergency department. Even though you are being discharged home, abdominal pain can be unpredictable. Therefore, you need a  repeated exam if your pain does not resolve, returns, or worsens. Most patients with abdominal pain don't have to be admitted to the hospital or have surgery, but serious problems like appendicitis and gallbladder attacks can start out as nonspecific pain. Many abdominal conditions cannot be diagnosed in one visit, so follow-up evaluations are very important. SEEK IMMEDIATE MEDICAL ATTENTION IF: The pain does not go away or becomes severe.  A temperature above 101 develops.  Repeated vomiting occurs (multiple episodes).  The pain becomes localized to portions of the abdomen. The right side could possibly be appendicitis. In an adult, the left lower portion of the abdomen could be colitis or diverticulitis.  Blood is being passed in stools or vomit (bright red or black tarry stools).  Return also if you develop chest pain, difficulty breathing, dizziness or fainting, or become confused, poorly responsive, or inconsolable (young children).

## 2014-06-03 NOTE — ED Notes (Signed)
Patient refusing care from staff and ambulatory to the EMS bay doors stating she was ready to go.  "If the ambulance brought me in it can take me home."  Pt was taken to the waiting room and a taxi called because patient was ambulatory with staff with steady gait.  PA Aline Brochure made aware.  Pt alert and oriented to ED staff.

## 2014-06-04 ENCOUNTER — Telehealth: Payer: Self-pay | Admitting: *Deleted

## 2014-06-04 ENCOUNTER — Encounter: Payer: Self-pay | Admitting: Internal Medicine

## 2014-06-04 ENCOUNTER — Ambulatory Visit (INDEPENDENT_AMBULATORY_CARE_PROVIDER_SITE_OTHER): Payer: PRIVATE HEALTH INSURANCE | Admitting: Internal Medicine

## 2014-06-04 VITALS — BP 134/88 | HR 101 | Temp 97.7°F | Ht 67.0 in | Wt 87.6 lb

## 2014-06-04 DIAGNOSIS — R634 Abnormal weight loss: Secondary | ICD-10-CM

## 2014-06-04 DIAGNOSIS — E872 Acidosis, unspecified: Secondary | ICD-10-CM | POA: Insufficient documentation

## 2014-06-04 DIAGNOSIS — G894 Chronic pain syndrome: Secondary | ICD-10-CM

## 2014-06-04 DIAGNOSIS — F1920 Other psychoactive substance dependence, uncomplicated: Secondary | ICD-10-CM

## 2014-06-04 DIAGNOSIS — F1120 Opioid dependence, uncomplicated: Secondary | ICD-10-CM

## 2014-06-04 DIAGNOSIS — F4489 Other dissociative and conversion disorders: Secondary | ICD-10-CM

## 2014-06-04 LAB — LACTIC ACID, PLASMA: LACTIC ACID: 1 mmol/L (ref 0.5–2.2)

## 2014-06-04 NOTE — Assessment & Plan Note (Signed)
Please see discussion under chronic pain assessment.   Asked patient to count pills at home and report back to pcp who can decide if she wishes to continue to refill medications. Per recent FYI in chart, PCP has noted not to refill any more opiates and/or benzo from [facility].

## 2014-06-04 NOTE — Telephone Encounter (Signed)
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  The count was # 25 BUT this was not checked by Narda Rutherford, just the patient. Janie's # [phone]

## 2014-06-04 NOTE — Telephone Encounter (Signed)
A lady called saying she is pt's pastor's wife and is trying to help pt, states pt is confused and does not know anything, does not know how to care for herself. She was advised to go to ED. Pt has now presented to [facility] stating she does not know how she got here, she then states she took her xanax, opana and nexium today, she also states she is" in this shape because her medicine has been cut off, i explained that her meds were not stopped but she insist that her med cut caused confusion. As we spoke she called someone on her cell phone. Her pastor's wife came in and she also told her that miss gill had cut her off cold [place]. She states she does not know what to do or who can help her. Dr Ellwood Dense spoke w/ pt and she will be seen here in clinic at 1015 by dr Eula Fried

## 2014-06-04 NOTE — Assessment & Plan Note (Addendum)
Unclear if chronic or acute as this is my first time meeting her but patient says it has been this way ever since her medications have been tapered, especially with decreased frequency of xanax. She says she cannot remember anything (although is alert and oriented to person, place today and knows home address, daughters name, and friend, but could not recall whole home phone number). Able to recall 2/3 names. Not very cooperative with exam and discussion today because she wanted to leave. Does not make great eye contact. Unable to conduct neuro examination today. Occasionally tearful.   Could be multifactorial--depression (occasionally tearful), stress (social factors, lives alone), dependency to narcotics and benzodiazepines, chronic pain, etc. May consider CT head if persistent (hx of cancer) or if having headaches, vision changes, and if able to conduct full neuro examination. She apparently had some unsteady gait after lab draw but refused examination and insisted on leaving at that time.   Could consider APS involvement? Will defer to PCP who knows patient better and may have better insight into social situation and discuss with family

## 2014-06-04 NOTE — Assessment & Plan Note (Signed)
2.3 during admission, and down to 1.0 today with AG of 14 on yesterday's bmet. Still having abdominal pain. CT with contrast abdomen 10/27 with ?colitis. During hospitalization, thought to be due to albuterol as beta agonists  Again, she did not wish to stay in opc longer for further evaluation.

## 2014-06-04 NOTE — Progress Notes (Signed)
Subjective:   Patient ID: [name] female   DOB: Oct 19, 1960 53 y.o.   MRN: [id number]  HPI: [name] is a 53 y.o. female with chronic pain and cervical cancer with other PMH as listed below presenting to opc today as acute visit. Her friend, [name]? (pastor's wife at her church) called the clinic this morning claiming the patient is confused and not able to care for herself. She was advised to go to the ED but came to [facility] and saying she did not know how she got here per the triage note. She does admit to taking her pain medication and xanax today but says ever since her doses were tapered she has been feeling this way--confused, not well, cant remember anything, and hurting all over. She says she was previously taking 4 xanax a day but now takes only 2 and still using flexeril patch but cannot remember when it is her day to switch, and also is taking opana. She does not recall how many of each tablets she has left at home, did not bring them with her today, and says not much and that someone may have taken them. Per friend and patient, lives by herself and friend says she does admit to stopping some of the medication on her own in the past but daughter and friend worried if this is withdrawal from benzodiazepines. Finally, [name] at times during the interview says the pain medicine the way it was helped her along with the old xanax dose, but then says the pain medicine does not help at other times and that she does not need it.   During the discussion, [name] continues to say she wants to go home and is hungry and does not want to stay. She is encouraged to stay by myself, Dr. Ellwood Dense, and friend, however, ultimately she does agree to lab work an then insists on leaving. She did allow for a very limited physical exam by myself and reports back pain and abdominal pain, and then Dr. Ellwood Dense was able to examine her back and abdomen on her own later.   When we discussed her ability to  care for herself at home, she became tearful, does not want to ask anyone for help and does not like the idea of needing help. She says she should be able to do it on her own. She thinks she can care for herself but denies cooking. When asked how she eats she says she gets people to take her to places for food. She is losing weight but has good appetite at this time and is very hungry. Per friend, they have been talking to her about possible PCS, however, patient is not in agreement and also not in agreement today but said she will think about it.    Of note, recent hospital admission from 10/29-10/30 for chest pain thought to be secondary to anxiety and weight loss noted and mild colitis. She was discharged home on antibiotics which she says she has been taking but not sure how many tablets are left. CT abdomen 10/27: ?colitis, retroperitoneal shotty lymph nodes increased in size since 2009 but stable from most recent CT. Narrowing of celiac artery and lower abdominal aorta and common iliac arteries. Prominent vessels extending to left aspect of uterus and adnexae.   ED visit yesterday 11/3 for abdominal pain: leukopenia has resolved, electrolytes okay, glucose of 100, no leukocytosis and Hb up to 14.1. Abdominal and chest xray 11/3 negative. Patient noted  to refuse care from staff and insisted on leaving. Later more cooperative but insisted on leaving and was discharged.   She refuses to stay in opc longer today for further evaluation and also refuses to go to the ED for any worsening of symptoms at this time but I have stressed this to her again to return to opc or ED as did Dr. Ellwood Dense.   Past Medical History  Diagnosis Date  . Anxiety   . Depression   . Cervical cancer     Discovered by pap smear, treated by Dr. Charlesetta Garibaldi with LEEP procedure.  Pap since that time has been normal.  . Chronic back pain      her CT of the head without contrast June 28, 2008- moderate to large right paracentral  disc protrusion at C4-C5 resulting in a right C5 neuroforaminal stenosis and probably some degree of spinal stenosis. C. bilateral C6 neuroforaminal stenosis related to  degenerative hypertrophy, no acute fracture or loose pieces identifying the cervical spine, ligamentous injury is not excluded.    Marland Kitchen Headache(784.0)   . COPD (chronic obstructive pulmonary disease)   . Arthritis   . GERD (gastroesophageal reflux disease)  October 2011     Williams, esophagogram - tiny amount of gastroesophageal reflux otherwise unremarkable exam, done by Dr. Dellis Filbert  . Osteoporosis     T score L femur neck = -2.6 (02/21/12)  . Fibromyalgia   . Hemorrhoids    Current Outpatient Prescriptions  Medication Sig Dispense Refill  . ALPRAZolam (XANAX) 1 MG tablet Take 1 mg by mouth 2 (two) times daily. This must last 30 days    . budesonide-formoterol (SYMBICORT) 160-4.5 MCG/ACT inhaler Inhale 2 puffs into the lungs 2 (two) times daily. 1 Inhaler 11  . ciprofloxacin (CIPRO) 500 MG tablet Take 1 tablet (500 mg total) by mouth 2 (two) times daily. One po bid x 7 days 14 tablet 0  . esomeprazole (NEXIUM) 40 MG capsule Take 1 capsule (40 mg total) by mouth daily. 30 capsule 11  . fentaNYL (DURAGESIC - DOSED MCG/HR) 75 MCG/HR Place 75 mcg onto the skin every 3 (three) days.    Marland Kitchen HYDROcodone-acetaminophen (NORCO/VICODIN) 5-325 MG per tablet     . metroNIDAZOLE (FLAGYL) 500 MG tablet Take 1 tablet (500 mg total) by mouth 2 (two) times daily. One po bid x 7 days 14 tablet 0  . oxymorphone (OPANA) 10 MG tablet Take 10 mg by mouth daily.    . polyethylene glycol (MIRALAX / GLYCOLAX) packet Take 17 g by mouth daily as needed for mild constipation.    . polyethylene glycol powder (GLYCOLAX/MIRALAX) powder     . Promethazine HCl (PHENERGAN PO) Take 1 tablet by mouth every 8 (eight) hours as needed (nausea).     Current Facility-Administered Medications  Medication Dose Route Frequency Provider Last Rate Last Dose  .  cyanocobalamin ((VITAMIN B-12)) injection 1,000 mcg  1,000 mcg Intramuscular Q30 days Acquanetta Chain, DO   1,000 mcg at 02/24/11 1218   Family History  Problem Relation Age of Onset  . Stroke Mother   . Diabetes Mother   . Heart disease Father   . Lung cancer Daughter    History   Social History  . Marital Status: Divorced    Spouse Name: N/A    Number of Children: N/A  . Years of Education: N/A   Social History Main Topics  . Smoking status: Former Smoker -- 1.00 packs/day for 34 years    Types: Cigarettes  .  Smokeless tobacco: Never Used     Comment: hx drugs non since 95/ TRYING TO QUITS/ HAS CUT BACK  . Alcohol Use: No  . Drug Use: No  . Sexual Activity: None   Other Topics Concern  . None   Social History Narrative   10th grade education. Married then divorced mid 60's. One daughter. Crack cocaine about 1995 ish. One Mollie [place] admit 2001 for SI. Deemed disabled 04/01/2004 By [facility] due to documented health and mental issues.   Review of Systems: limited due to lack of patient cooperation Constitutional:  Feeling cold, confused, weight loss but hungry today  HEENT:  Neck pain  Respiratory:  Said could not breath yesterday when she went to the emergency room  Gastrointestinal:  Abdominal pain  Genitourinary:  Occasional dysuria  Musculoskeletal:  Chronic back pain  Neurological:  Decreased memmory   Objective:  Physical Exam: Filed Vitals:   06/04/14 0958  BP: 134/88  Pulse: 101  Temp: 97.7 F (36.5 C)  TempSrc: Oral  Height: 5\' 7"  (1.702 m)  Weight: 87 lb 9.6 oz (39.735 kg)  SpO2: 98%   Vitals reviewed. General: sitting in chair, not making eye contact, reporting in pain, hungry, and wanting to home HEENT: occasionally tearful, makes eye contact when asked Cardiac: tachycardia Pulm: clear to auscultation bilaterally but poor effort Abd: very limited exam by myself allowed by patient while sitting in chair and flinched as soon as I touched the  stomach Ext: back pain, flinched of pain at even slight touch of mid back Neuro: alert and oriented X3, not able to tell full phone number but did tell me first three digits and was able to correctly identify daugthers name and home address. Recall names home, car, pen, she was able to remember home, then remembered pen with prompt, but could not recall car after several minutes Apparently unsteady gait per staff after lab draw, however, not witnessed by myself and when asked to evaluate she refused and insisted on leaving.   Assessment & Plan:  Discussed with Dr. Ellwood Dense Patient insisted on leaving opc

## 2014-06-04 NOTE — Telephone Encounter (Signed)
Agree 

## 2014-06-04 NOTE — Assessment & Plan Note (Addendum)
Weight 116lb's 07/2011 today down to 87.6 lb's but was 92lb in ED yesterday and on hospital discharge 10/30. ?scales. However, she admits to not always eating regularly due to lack of transportation, but currently is very hungry and insisting to leave to get food and go home.   Hx of cervical cancer, would recommend further work up when she agrees. TSH 0.8 01/2014. No leukocytosis and Hb 14 today. No major electrolyte abnormalities. Abdominal pain and chronic back pain complaints today. Could be all secondary to malnutrition but malignancy work up needs to remain in differential.  Of note, apparently was slightly unsteady after lab draw today and endorses confusion and memory loss lately. Could consider CT head if persistent and if she allows but would need through neurological examination and patient cooperation.

## 2014-06-04 NOTE — Assessment & Plan Note (Signed)
Continues to complain of chronic back pain and also saying abdominal pain. Went to the ED yesterday for abdominal pain but insisted on leaving and refusing further work up. She is upset about her medications being tapered and says that is why she does not feel right, more confused, and cant remember anything. She did not bring her medications to opc today and does not recall how many are left. Sometimes says the pain medication works but other times says it does not. Looking over all documentation, it appears she has had inappropriate UDS in the past and concern for dependence, which is why her medications were being tapered. However, most recent FYI by PCP on 10/27 says "Patient should not get opioid meds or benzos from the [facility].  She has had an inappropriate UDS on multiple occasions. " last UDS 10/29 positive for opiates but negative for benzo and patient says that is because she does not always take the xanax, however, because the frequency has been decreased she feels the way she is right now.   While she is not requesting refills today, she does say her pain is uncontrolled. i have asked her to return for a follow up visit with PCP and to bring in all her pain medications. This has also been said to her friend who is present with her today who will likely be her transportation. Ms. Randa gave permission to discuss her care with the friend today and may also allow her to count how many tablets are left at home to call and relay back to PCP for her record in case she does decide to refill any more medications going forward. I would caution against abrupt cessation of benzo's in Ms. Folkerts who appears to have been using them for years.   Also question of if some family members may be taking the medication? Perhaps APS should be considered but I will defer this to pcp who knows the patient better and who will have my note to review.   Very limited exam today allowed by patient for back and abdomen. She is  able to move around in the chair and walk but was apparently "wobbly" after lab draw in opc but refused any further evaluation and insisted on going home. She has been advised to return or go directly to the ED if she has any more unsteadiness, vision disturbance, or headache or worsening of pain.

## 2014-06-05 ENCOUNTER — Other Ambulatory Visit: Payer: Self-pay | Admitting: Internal Medicine

## 2014-06-09 ENCOUNTER — Encounter: Payer: PRIVATE HEALTH INSURANCE | Admitting: Internal Medicine

## 2014-06-13 ENCOUNTER — Ambulatory Visit (INDEPENDENT_AMBULATORY_CARE_PROVIDER_SITE_OTHER): Payer: PRIVATE HEALTH INSURANCE | Admitting: Internal Medicine

## 2014-06-13 ENCOUNTER — Encounter: Payer: Self-pay | Admitting: Internal Medicine

## 2014-06-13 VITALS — BP 115/86 | HR 96 | Temp 97.5°F | Ht 67.0 in | Wt 91.2 lb

## 2014-06-13 DIAGNOSIS — G894 Chronic pain syndrome: Secondary | ICD-10-CM

## 2014-06-13 DIAGNOSIS — F12980 Cannabis use, unspecified with anxiety disorder: Secondary | ICD-10-CM

## 2014-06-13 DIAGNOSIS — Z0000 Encounter for general adult medical examination without abnormal findings: Secondary | ICD-10-CM

## 2014-06-13 MED ORDER — ALPRAZOLAM 1 MG PO TABS: 1.0000 mg | ORAL_TABLET | Freq: Every day | ORAL | Status: DC | PRN

## 2014-06-13 MED ORDER — FENTANYL 50 MCG/HR TD PT72: 50.0000 ug | MEDICATED_PATCH | TRANSDERMAL | Status: DC

## 2014-06-13 NOTE — Progress Notes (Signed)
Case discussed with Dr. Qureshi at the time of the visit.  We reviewed the resident's history and exam and pertinent patient test results.  I agree with the assessment, diagnosis, and plan of care documented in the resident's note. 

## 2014-06-13 NOTE — Progress Notes (Signed)
Patient ID: [name], female   DOB: Nov 14, 1960, 53 y.o.   MRN: [id number] [facility] Attending  Wean fentanyl further, no more opana, #30 pills of xanax for a month, follow up on the pain clinic referral.   Case discussed with Dr. Aundra Dubin at the time of the visit.  We reviewed the resident's history and exam and pertinent patient test results.  I agree with the assessment, diagnosis, and plan of care documented in the resident's note.

## 2014-06-13 NOTE — Patient Instructions (Addendum)
General Instructions: Please take Xanax 1 mg daily as needed  Take Fentanyl 50 mcg patch every 3 days We will send you for pap smear and OB/GYN  Please establish with the pain clinic and behavioral health   Treatment Goals:  Goals (1 Years of Data) as of 06/13/14    None      Progress Toward Treatment Goals:  Treatment Goal 01/08/2014  Stop smoking smoking the same amount  Prevent falls -    Self Care Goals & Plans:  Self Care Goal 03/31/2014  Manage my medications take my medicines as prescribed; bring my medications to every visit; refill my medications on time  Monitor my health -  Eat healthy foods eat more vegetables; eat foods that are low in salt; eat baked foods instead of fried foods  Be physically active -  Stop smoking cut down the number of cigarettes smoked    No flowsheet data found.   Care Management & Community Referrals:  Referral 06/13/2014  Referrals made for care management support -  Referrals made to community resources none       Chronic Back Pain  When back pain lasts longer than 3 months, it is called chronic back pain.People with chronic back pain often go through certain periods that are more intense (flare-ups).  CAUSES Chronic back pain can be caused by wear and tear (degeneration) on different structures in your back. These structures include:  The bones of your spine (vertebrae) and the joints surrounding your spinal cord and nerve roots (facets).  The strong, fibrous tissues that connect your vertebrae (ligaments). Degeneration of these structures may result in pressure on your nerves. This can lead to constant pain. HOME CARE INSTRUCTIONS  Avoid bending, heavy lifting, prolonged sitting, and activities which make the problem worse.  Take brief periods of rest throughout the day to reduce your pain. Lying down or standing usually is better than sitting while you are resting.  Take over-the-counter or prescription medicines  only as directed by your caregiver. SEEK IMMEDIATE MEDICAL CARE IF:   You have weakness or numbness in one of your legs or feet.  You have trouble controlling your bladder or bowels.  You have nausea, vomiting, abdominal pain, shortness of breath, or fainting. Document Released: 08/25/2004 Document Revised: 10/10/2011 Document Reviewed: 07/02/2011 [facility] Patient Information 2015 [place], [place]. This information is not intended to replace advice given to you by your health care provider. Make sure you discuss any questions you have with your health care provider. Generalized Anxiety Disorder Generalized anxiety disorder (GAD) is a mental disorder. It interferes with life functions, including relationships, work, and school. GAD is different from normal anxiety, which everyone experiences at some point in their lives in response to specific life events and activities. Normal anxiety actually helps [place] prepare for and get through these life events and activities. Normal anxiety goes away after the event or activity is over.  GAD causes anxiety that is not necessarily related to specific events or activities. It also causes excess anxiety in proportion to specific events or activities. The anxiety associated with GAD is also difficult to control. GAD can vary from mild to severe. People with severe GAD can have intense waves of anxiety with physical symptoms (panic attacks).  SYMPTOMS The anxiety and worry associated with GAD are difficult to control. This anxiety and worry are related to many life events and activities and also occur more days than not for 6 months or longer. People with GAD also  have three or more of the following symptoms (one or more in children):  Restlessness.   Fatigue.  Difficulty concentrating.   Irritability.  Muscle tension.  Difficulty sleeping or unsatisfying sleep. DIAGNOSIS GAD is diagnosed through an assessment by your health care provider. Your health  care provider will ask you questions aboutyour mood,physical symptoms, and events in your life. Your health care provider may ask you about your medical history and use of alcohol or drugs, including prescription medicines. Your health care provider may also do a physical exam and blood tests. Certain medical conditions and the use of certain substances can cause symptoms similar to those associated with GAD. Your health care provider may refer you to a mental health specialist for further evaluation. TREATMENT The following therapies are usually used to treat GAD:   Medication. Antidepressant medication usually is prescribed for long-term daily control. Antianxiety medicines may be added in severe cases, especially when panic attacks occur.   Talk therapy (psychotherapy). Certain types of talk therapy can be helpful in treating GAD by providing support, education, and guidance. A form of talk therapy called cognitive behavioral therapy can teach you healthy ways to think about and react to daily life events and activities.  Stress managementtechniques. These include yoga, meditation, and exercise and can be very helpful when they are practiced regularly. A mental health specialist can help determine which treatment is best for you. Some people see improvement with one therapy. However, other people require a combination of therapies. Document Released: 11/12/2012 Document Revised: 12/02/2013 Document Reviewed: 11/12/2012 [facility] Patient Information 2015 [place], [place]. This information is not intended to replace advice given to you by your health care provider. Make sure you discuss any questions you have with your health care provider.

## 2014-06-13 NOTE — Assessment & Plan Note (Addendum)
Will refer for mammogram Will refer to OB/GYN for cervical cancer screening Declines pneumonia vaccine today

## 2014-06-13 NOTE — Progress Notes (Signed)
   Subjective:    Patient ID: [name], female    DOB: [birth year], 53 y.o.   MRN: [id number]  HPI Comments: 53 y.o pmh chronic pain (back, fibromyalgia) and anxiety, h/o substance abuse  She presents for 1 week f/u  1. She states she needs Rx refills of her Opana, Fentanyl, and Xanax for chronic pain and anxiety. She brought in her pills container with evidence of Xanax and Nexium in the container.  She states she last took Opana yesterday or the day before.   -Chronic pain she is allergic to Cymbalta and Lyrica and states she did not like the way Elavil made her feel. She states she has pain daily all over her body and in her joints and lower back (after a previous injury).  Pain is worse with the cold weather.  Fentanyl was recently reduced from 100 to 75 mcg daily and she was taking Opana 10 mg daily.    2. Anxiety  -she takes Xanax 1 mg bid and her PCP was trying to wean her down to daily -she does not want to follow with behavioral health and states she has always gotten her Xanax from Dr. Hilma Favors and Dr. Owens Shark.   -She previously followed at the [facility] but states they made it seem they do no want her to come back  3. H/o colitis recently-ab pain is better after completing cipro and Flagyl.  Reviewed labs from 1 week ago wnl  HM -will sch mammogram and f/u with OB/GYN for h/o cervical cancer (last pap 03/2012 negative).        Review of Systems  Constitutional:       Increased weight (lbs 91) and appetite appetite increased   Respiratory: Negative for shortness of breath.   Cardiovascular: Negative for chest pain.  Gastrointestinal: Negative for nausea and vomiting.  Genitourinary: Negative for dysuria.  Musculoskeletal: Positive for back pain.  Psychiatric/Behavioral: Positive for sleep disturbance.       Objective:   Physical Exam  Constitutional: She is oriented to person, place, and time. Vital signs are normal. She is cooperative. No distress.  Cachetic  appearing  HENT:  Head: Normocephalic and atraumatic.  Mouth/Throat: Abnormal dentition. No oropharyngeal exudate.  Eyes: Conjunctivae are normal. Pupils are equal, round, and reactive to light. Right eye exhibits no discharge. Left eye exhibits no discharge. No scleral icterus.  Cardiovascular: Normal rate, regular rhythm, S1 normal, S2 normal and normal heart sounds.   No murmur heard. Pulmonary/Chest: Effort normal and breath sounds normal. No respiratory distress. She has no wheezes.  Abdominal: Soft. Bowel sounds are normal. There is no tenderness.  Musculoskeletal:       Back:  Neurological: She is alert and oriented to person, place, and time. Gait normal.  Neg straight leg test b/l  Skin: Skin is warm, dry and intact. No rash noted. She is not diaphoretic.  Psychiatric: She has a normal mood and affect. Her speech is normal and behavior is normal. Judgment and thought content normal. Cognition and memory are normal.  Nursing note and vitals reviewed.         Assessment & Plan:  F/u with PCP

## 2014-06-13 NOTE — Assessment & Plan Note (Addendum)
rx Xanax 1 mg daily prn  Given pt info about [facility], pending pain clinic referral Only PCP to have disc with pt about benzos and narcotics at next visit or via telephone call

## 2014-06-13 NOTE — Assessment & Plan Note (Addendum)
Chronic pain as noted in the overview section. If pain is from Fibromyalgia narcotics are NOT tx of choice but pt has allergy to Cymbalta, lyrica and did not tolerate Elavil in the past  Will not Rx Opana today-advised pt establish with pain clinic which referral is pending Did refill Fentanyl but reduced dose from 75 mg q3 days to 50 mcg q 3 days with hopes to titrate off or have pt f/u with pain clinic.  Pt still has 4 patches of the 75 mcg patches PCP needs to have in depth discussion with pt on whether or no she will Rx narcotic medications

## 2014-06-23 ENCOUNTER — Encounter: Payer: Self-pay | Admitting: Internal Medicine

## 2014-06-23 ENCOUNTER — Ambulatory Visit (INDEPENDENT_AMBULATORY_CARE_PROVIDER_SITE_OTHER): Payer: PRIVATE HEALTH INSURANCE | Admitting: Internal Medicine

## 2014-06-23 VITALS — BP 145/111 | HR 102 | Temp 98.0°F | Wt 94.1 lb

## 2014-06-23 DIAGNOSIS — F12980 Cannabis use, unspecified with anxiety disorder: Secondary | ICD-10-CM

## 2014-06-23 DIAGNOSIS — F1114 Opioid abuse with opioid-induced mood disorder: Secondary | ICD-10-CM

## 2014-06-23 NOTE — Progress Notes (Signed)
Case discussed with Dr. Gill soon after the resident saw the patient.  We reviewed the resident's history and exam and pertinent patient test results.  I agree with the assessment, diagnosis, and plan of care documented in the resident's note. 

## 2014-06-23 NOTE — Patient Instructions (Signed)
Thank you for your visit today.   Please return to the internal medicine clinic in 1 month(s) or sooner if needed.    We will try to get you into a pain clinic. I also recommend that you contact the [facility] for your anxiety.  You may take tylenol or ibuprofen for your headache.     Please be sure to bring all of your medications with you to every visit; this includes herbal supplements, vitamins, eye drops, and any over-the-counter medications.   Should you have any questions regarding your medications and/or any new or worsening symptoms, please be sure to call the clinic at [phone].   If you believe that you are suffering from a life threatening condition or one that may result in the loss of limb or function, then you should call 911 or proceed to the nearest Emergency Department.

## 2014-06-23 NOTE — Assessment & Plan Note (Addendum)
Ms. Hollern comes in for a follow-up appointment.  She complains of anxiety and heart racing.  She is still taking xanax 1mg  daily (but is not taking it "as needed" which is how it is prescribed).  I discussed with her about going to the [facility] for further management of her anxiety and she states she was not going back.  She is upset with me stating that "I am not a real doctor and she wants to see a real doctor."  She said "you're just an intern."  She said she was going to see "Dr. Purcell Nails because he is on my insurance card."  She states that she had no trouble with any of the doctors here until me because I tapered her off of her medications.  I tried to explain to her that I felt that I was giving her the best care possible in my opinion and was sorry that she feels differently.  She then states she was going to find another doctor.  I reiterated that my recommendation is that she continue to be seen at the [facility].  However, she states they do not want to see her again.  I spoke with her counselor previously, and he told me that she doesn't want help and has no desire to get help or attend counseling.  We are still working on getting her into a pain clinic but it is unlikely that they will accept her given her drug abuse and misuse.   -advised her to return in 1 month for a recheck, but she stated she "wasn't coming back" -did not provide any refills today as she still has xanax and fentanyl

## 2014-06-23 NOTE — Progress Notes (Signed)
Patient ID: [name], female   DOB: 1961/07/16, 53 y.o.   MRN: [id number]    Subjective:   Patient ID: [name] female    DOB: October 20, 1960 53 y.o.    MRN: [id number] Health Maintenance Due: Health Maintenance Due  Topic Date Due  . PNEUMOCOCCAL POLYSACCHARIDE VACCINE (1) 01/19/1961  . TETANUS/TDAP  01/19/1978  . MAMMOGRAM  02/20/2014    _________________________________________________  HPI: [name] is a 53 y.o. female here for a routine visit.  Pt has a PMH outlined below.  Please see problem-based charting assessment and plan note for further details of medical issues addressed at today's visit.  PMH: Past Medical History  Diagnosis Date  . Anxiety   . Depression   . Cervical cancer     Discovered by pap smear, treated by Dr. Charlesetta Garibaldi with LEEP procedure.  Pap since that time has been normal.  . Chronic back pain      her CT of the head without contrast June 28, 2008- moderate to large right paracentral disc protrusion at C4-C5 resulting in a right C5 neuroforaminal stenosis and probably some degree of spinal stenosis. C. bilateral C6 neuroforaminal stenosis related to  degenerative hypertrophy, no acute fracture or loose pieces identifying the cervical spine, ligamentous injury is not excluded.    Marland Kitchen Headache(784.0)   . COPD (chronic obstructive pulmonary disease)   . Arthritis   . GERD (gastroesophageal reflux disease)  October 2011     Williams, esophagogram - tiny amount of gastroesophageal reflux otherwise unremarkable exam, done by Dr. Dellis Filbert  . Osteoporosis     T score L femur neck = -2.6 (02/21/12)  . Fibromyalgia   . Hemorrhoids     Medications: Current Outpatient Prescriptions on File Prior to Visit  Medication Sig Dispense Refill  . ALPRAZolam (XANAX) 1 MG tablet Take 1 tablet (1 mg total) by mouth daily as needed for anxiety. This must last 30 days 30 tablet 0  . budesonide-formoterol (SYMBICORT) 160-4.5 MCG/ACT inhaler Inhale 2 puffs  into the lungs 2 (two) times daily. 1 Inhaler 11  . esomeprazole (NEXIUM) 40 MG capsule Take 1 capsule (40 mg total) by mouth daily. 30 capsule 11  . fentaNYL (DURAGESIC - DOSED MCG/HR) 50 MCG/HR Place 1 patch (50 mcg total) onto the skin every 3 (three) days. 4 patch 0  . oxymorphone (OPANA) 10 MG tablet Take 10 mg by mouth daily.    . polyethylene glycol (MIRALAX / GLYCOLAX) packet Take 17 g by mouth daily as needed for mild constipation.     Current Facility-Administered Medications on File Prior to Visit  Medication Dose Route Frequency Provider Last Rate Last Dose  . cyanocobalamin ((VITAMIN B-12)) injection 1,000 mcg  1,000 mcg Intramuscular Q30 days Acquanetta Chain, DO   1,000 mcg at 02/24/11 1218    Allergies: Allergies  Allergen Reactions  . Aripiprazole Other (See Comments)    Suicidal ideation, also with SSRI  . Chantix [Varenicline] Other (See Comments)    Irritability and depressed mood  . Cymbalta [Duloxetine Hcl] Other (See Comments)    Irritability. 3/11  . Pregabalin Hives    On trunk of body  . Zolpidem Tartrate Other (See Comments)    unknown    FH: Family History  Problem Relation Age of Onset  . Stroke Mother   . Diabetes Mother   . Heart disease Father   . Lung cancer Daughter     SH: History   Social History  . Marital  Status: Divorced    Spouse Name: N/A    Number of Children: N/A  . Years of Education: N/A   Social History Main Topics  . Smoking status: Former Smoker -- 1.00 packs/day for 34 years    Types: Cigarettes  . Smokeless tobacco: Never Used     Comment: hx drugs non since 95/ TRYING TO QUITS/ HAS CUT BACK  . Alcohol Use: No  . Drug Use: No  . Sexual Activity: None   Other Topics Concern  . None   Social History Narrative   10th grade education. Married then divorced mid 25's. One daughter. Crack cocaine about 1995 ish. One Mollie [place] admit 2001 for SI. Deemed disabled 04/01/2004 By [employer] due to documented health and mental  issues.    Review of Systems: Constitutional: Negative for fever, chills and weight loss.  Eyes: Negative for blurred vision.  Respiratory: Negative for cough and shortness of breath.  Cardiovascular: Negative for chest pain, palpitations and leg swelling.  Gastrointestinal: Negative for nausea, vomiting, abdominal pain, diarrhea, constipation and blood in stool.  Genitourinary: Negative for dysuria, urgency and frequency.  Musculoskeletal: Negative for myalgias and back pain.  Neurological: Negative for dizziness, weakness and headaches.     Objective:   Vital Signs: Filed Vitals:   06/23/14 1031  BP: 145/111  Pulse: 102  Temp: 98 F (36.7 C)  TempSrc: Oral  Weight: 94 lb 1.6 oz (42.683 kg)  SpO2: 99%     BP Readings from Last 3 Encounters:  06/23/14 145/111  06/13/14 115/86  06/04/14 134/88    Physical Exam: Constitutional: Vital signs reviewed.  Patient appears much older than her stated age and is extremely cachetic.   Head: Normocephalic and atraumatic. Eyes: PERRL, EOMI, conjunctivae nl, no scleral icterus.  Neck: Supple. Cardiovascular: RRR, no MRG. Pulmonary/Chest: normal effort, non-tender to palpation, CTAB, no wheezes, rales, or rhonchi. Abdominal: Soft. NT/ND +BS. Neurological: A&O x3, cranial nerves II-XII are grossly intact, moving all extremities. Extremities: 2+DP b/l; no pitting edema. Skin: Warm, dry and intact. No rash.   Assessment & Plan:   Assessment and plan was discussed and formulated with my attending.

## 2014-06-30 ENCOUNTER — Telehealth: Payer: Self-pay | Admitting: *Deleted

## 2014-06-30 NOTE — Telephone Encounter (Signed)
Pt called c/o of back pain and Tylenol is not helping. Waiting for appt at [facility]. Appt to check in clinic 07/01/14 2:45PM Dr Eulas Post. Hilda Blades Sonya Gunnoe RN 06/30/14 2PM

## 2014-07-01 ENCOUNTER — Ambulatory Visit (INDEPENDENT_AMBULATORY_CARE_PROVIDER_SITE_OTHER): Payer: PRIVATE HEALTH INSURANCE | Admitting: Internal Medicine

## 2014-07-01 VITALS — BP 124/100 | HR 105 | Temp 97.8°F | Wt 94.9 lb

## 2014-07-01 DIAGNOSIS — G894 Chronic pain syndrome: Secondary | ICD-10-CM

## 2014-07-01 DIAGNOSIS — Z87891 Personal history of nicotine dependence: Secondary | ICD-10-CM

## 2014-07-01 NOTE — Progress Notes (Signed)
Patient ID: [name], female   DOB: April 29, 1961, 53 y.o.   MRN: [id number]  Subjective:   Patient ID: [name] female   DOB: [birth year] 53 y.o.   MRN: [id number]  HPI: [name] is a 53 y.o. F w/ PMH chronic back pain presents c/o back pain.   She was seen by her PCP on 11/23 c/o anxiety, and became upset with her b/c her PCP is trying to wean her off Xanax and narcotic pain medicaiton. The patient has been referred to Pain Management but has a h/o narcotic abuse, so the referral is still pending. Per the patient's PCP, the patient is not to be prescribed narcotics or Xanax.   She states that 1 week ago she took a Percocet from an acquaintance which did help her pain.   She denies fevers, chills. She endorses shoulder pain and back pain.   Past Medical History  Diagnosis Date  . Anxiety   . Depression   . Cervical cancer     Discovered by pap smear, treated by Dr. Charlesetta Garibaldi with LEEP procedure.  Pap since that time has been normal.  . Chronic back pain      her CT of the head without contrast June 28, 2008- moderate to large right paracentral disc protrusion at C4-C5 resulting in a right C5 neuroforaminal stenosis and probably some degree of spinal stenosis. C. bilateral C6 neuroforaminal stenosis related to  degenerative hypertrophy, no acute fracture or loose pieces identifying the cervical spine, ligamentous injury is not excluded.    Marland Kitchen Headache(784.0)   . COPD (chronic obstructive pulmonary disease)   . Arthritis   . GERD (gastroesophageal reflux disease)  October 2011     Williams, esophagogram - tiny amount of gastroesophageal reflux otherwise unremarkable exam, done by Dr. Dellis Filbert  . Osteoporosis     T score L femur neck = -2.6 (02/21/12)  . Fibromyalgia   . Hemorrhoids    Current Outpatient Prescriptions  Medication Sig Dispense Refill  . ALPRAZolam (XANAX) 1 MG tablet Take 1 tablet (1 mg total) by mouth daily as needed for anxiety. This must last 30 days  30 tablet 0  . budesonide-formoterol (SYMBICORT) 160-4.5 MCG/ACT inhaler Inhale 2 puffs into the lungs 2 (two) times daily. 1 Inhaler 11  . esomeprazole (NEXIUM) 40 MG capsule Take 1 capsule (40 mg total) by mouth daily. 30 capsule 11  . polyethylene glycol (MIRALAX / GLYCOLAX) packet Take 17 g by mouth daily as needed for mild constipation.    . fentaNYL (DURAGESIC - DOSED MCG/HR) 50 MCG/HR Place 1 patch (50 mcg total) onto the skin every 3 (three) days. (Patient not taking: Reported on 07/01/2014) 4 patch 0   Current Facility-Administered Medications  Medication Dose Route Frequency Provider Last Rate Last Dose  . cyanocobalamin ((VITAMIN B-12)) injection 1,000 mcg  1,000 mcg Intramuscular Q30 days Acquanetta Chain, DO   1,000 mcg at 02/24/11 1218   Family History  Problem Relation Age of Onset  . Stroke Mother   . Diabetes Mother   . Heart disease Father   . Lung cancer Daughter    History   Social History  . Marital Status: Divorced    Spouse Name: N/A    Number of Children: N/A  . Years of Education: N/A   Social History Main Topics  . Smoking status: Former Smoker -- 1.00 packs/day for 34 years    Types: Cigarettes  . Smokeless tobacco: Never Used     Comment:  hx drugs non since 95/ TRYING TO QUITS/ HAS CUT BACK  . Alcohol Use: No  . Drug Use: No  . Sexual Activity: Not on file   Other Topics Concern  . Not on file   Social History Narrative   10th grade education. Married then divorced mid 5's. One daughter. Crack cocaine about 1995 ish. One Mollie [place] admit 2001 for SI. Deemed disabled 04/01/2004 By [employer] due to documented health and mental issues.   Review of Systems: A 12 point ROS was performed; pertinent positives and negatives were noted in the HPI   Objective:  Physical Exam: Filed Vitals:   07/01/14 1539  BP: 124/100  Pulse: 105  Temp: 97.8 F (36.6 C)  TempSrc: Oral  Weight: 94 lb 14.4 oz (43.046 kg)  SpO2: 98%   Patient left before the visit  was over, and the Physical Exam was unable to be performed.   Assessment & Plan:   Please refer to Problem List based Assessment and Plan

## 2014-07-01 NOTE — Assessment & Plan Note (Addendum)
Pt still with back pain and is requesting more pain medication. She denies any change to her pain and is not having any bladder or bowel incontinence. She has a h/o illicit drug use and drug overdose in January '15, and her PCP is trying to get her into Pain Management, referral is still pending. The patient left the clinic early before the visit was ove, stating "I wouldn't have come if I knew I wasn't getting any pain medicine." - Will check on referral for pain management - Pt advised to try Tylenol for her pain - Pt advised to f/u with her PCP ASAP to discuss her pain control regimen

## 2014-07-02 NOTE — Progress Notes (Signed)
INTERNAL MEDICINE TEACHING ATTENDING ADDENDUM - Saniya Tranchina, MD: I reviewed and discussed at the time of visit with the resident Dr. Glenn, the patient's medical history, physical examination, diagnosis and results of pertinent tests and treatment and I agree with the patient's care as documented.  

## 2014-07-03 ENCOUNTER — Encounter: Payer: Self-pay | Admitting: Obstetrics & Gynecology

## 2014-07-04 ENCOUNTER — Ambulatory Visit (INDEPENDENT_AMBULATORY_CARE_PROVIDER_SITE_OTHER): Payer: PRIVATE HEALTH INSURANCE | Admitting: Internal Medicine

## 2014-07-04 ENCOUNTER — Encounter: Payer: Self-pay | Admitting: Internal Medicine

## 2014-07-04 VITALS — BP 128/84 | HR 114 | Temp 98.3°F | Resp 20 | Ht 67.0 in | Wt 95.0 lb

## 2014-07-04 DIAGNOSIS — F12980 Cannabis use, unspecified with anxiety disorder: Secondary | ICD-10-CM

## 2014-07-04 DIAGNOSIS — G894 Chronic pain syndrome: Secondary | ICD-10-CM

## 2014-07-04 DIAGNOSIS — J449 Chronic obstructive pulmonary disease, unspecified: Secondary | ICD-10-CM

## 2014-07-04 DIAGNOSIS — F418 Other specified anxiety disorders: Secondary | ICD-10-CM

## 2014-07-04 MED ORDER — DULOXETINE HCL 30 MG PO CPEP: 30.0000 mg | ORAL_CAPSULE | Freq: Every day | ORAL | Status: DC

## 2014-07-04 MED ORDER — FENTANYL 75 MCG/HR TD PT72: 75.0000 ug | MEDICATED_PATCH | TRANSDERMAL | Status: DC

## 2014-07-04 MED ORDER — HYDROCODONE-ACETAMINOPHEN 5-325 MG PO TABS: 1.0000 | ORAL_TABLET | Freq: Four times a day (QID) | ORAL | Status: DC | PRN

## 2014-07-04 MED ORDER — OXYCODONE HCL 5 MG PO CAPS: 5.0000 mg | ORAL_CAPSULE | Freq: Two times a day (BID) | ORAL | Status: DC | PRN

## 2014-07-04 MED ORDER — ALPRAZOLAM 1 MG PO TABS: 1.0000 mg | ORAL_TABLET | Freq: Two times a day (BID) | ORAL | Status: DC | PRN

## 2014-07-04 NOTE — Assessment & Plan Note (Signed)
We'll put patient back on fentanyl patch as well as OxyIR for breakthrough. We'll see her back every 3 months and use urine drug screen as monitoring. She did have good quality of life while on her chronic opiates for pain. Since being off she's been unable to do many of her daily activities.

## 2014-07-04 NOTE — Progress Notes (Signed)
Pre visit review using our clinic review tool, if applicable. No additional management support is needed unless otherwise documented below in the visit note. 

## 2014-07-04 NOTE — Patient Instructions (Signed)
We will give you back the fentanyl patch. We will also give you the Xanax that you can take twice a day as needed. We have also giving an additional pain medicine that you can take 1 or 2 times a day as needed for pain  The new medicine that we would like you to try to help with the anxiety is called duloxetine. Take 1 pill a day and this may help with your anxiety. It sometimes takes up to 1 month to work.  We will see you back in about 3 months.

## 2014-07-04 NOTE — Progress Notes (Signed)
   Subjective:    Patient ID: [name], female    DOB: Mar 01, 1961, 53 y.o.   MRN: [id number]  HPI The patient is a 53 year old female who comes in today to establish care. She has had some problems recently with medications being abruptly stopped that is given her some trouble. She has issues with chronic back pain and anxiety. She has tried multiple medications in the past for her depression and anxiety without good relief. Despite the fact that Cymbalta is listed on her allergy list she does not remember ever taking the medication and would be willing to try taking that medication again. She did fairly well while she was on fentanyl patch however recently the decision was made by her PCP to stop the fentanyl patch and she has been very poorly functioning since that time. She was unable to get around and do much. This is decreased significantly her quality of life.  Review of Systems  Constitutional: Positive for activity change and appetite change. Negative for fever and fatigue.  HENT: Negative.   Respiratory: Negative for chest tightness, shortness of breath and wheezing.   Cardiovascular: Negative for palpitations.  Musculoskeletal: Positive for back pain and arthralgias. Negative for gait problem.      Objective:   Physical Exam  Constitutional: She is oriented to person, place, and time. She appears well-developed.  Thin  HENT:  Head: Normocephalic and atraumatic.  Patch of alopecia  Eyes: EOM are normal.  Neck: Normal range of motion.  Cardiovascular: Normal rate and regular rhythm.   Pulmonary/Chest: Effort normal and breath sounds normal.  Abdominal: Soft. Bowel sounds are normal.  Neurological: She is alert and oriented to person, place, and time.  Skin: Skin is warm and dry.   Filed Vitals:   07/04/14 1612  BP: 128/84  Pulse: 114  Temp: 98.3 F (36.8 C)  TempSrc: Oral  Resp: 20  Height: 5\' 7"  (1.702 m)  Weight: 95 lb (43.092 kg)  SpO2: 97%      Assessment  & Plan:

## 2014-07-04 NOTE — Assessment & Plan Note (Signed)
Patient willing to trial Cymbalta and will start at 30 mg. If she is doing well at follow-up in 3 months we'll try to move her to 60 mg daily. Will decrease Xanax from 3 times a day to twice a day. Feel that if she is able to tolerate the Cymbalta we may be able to titrate down and decrease some of her sedation while still provide good anxiety and pain relief.

## 2014-07-04 NOTE — Assessment & Plan Note (Signed)
Last FEV1 36%. Would be a good candidate for pulmonary rehabilitation. Will discuss with her at next visit as she is currently in too much pain to consider.

## 2014-07-10 ENCOUNTER — Ambulatory Visit: Payer: PRIVATE HEALTH INSURANCE | Admitting: Internal Medicine

## 2014-07-14 ENCOUNTER — Telehealth: Payer: Self-pay | Admitting: Internal Medicine

## 2014-07-14 NOTE — Telephone Encounter (Signed)
Spoke with patient and informed her that Dr. Doug Sou would discuss increasing pain medication during next office visit.

## 2014-07-14 NOTE — Telephone Encounter (Signed)
Please call and let her know that we can discuss at her next visit.

## 2014-07-14 NOTE — Telephone Encounter (Signed)
Pt wants to know if Dr Doug Sou would go up to the next level (higher dose) of her current pain medications. Pls let pt know. Thank you

## 2014-07-28 ENCOUNTER — Telehealth: Payer: Self-pay | Admitting: Internal Medicine

## 2014-07-28 NOTE — Telephone Encounter (Signed)
Pt requesting refills of nexium, xanex, cymbalta, oxycodone, fentanyl patch. Pt requesting a call regarding these medications.   [phone]

## 2014-07-29 NOTE — Telephone Encounter (Signed)
Spoke with patient and informed her that she can come pick up the prescriptions on Thursday, per Dr. Doug Sou.

## 2014-07-31 ENCOUNTER — Other Ambulatory Visit: Payer: Self-pay | Admitting: Geriatric Medicine

## 2014-07-31 DIAGNOSIS — G894 Chronic pain syndrome: Secondary | ICD-10-CM

## 2014-07-31 MED ORDER — OXYCODONE HCL 5 MG PO CAPS: 5.0000 mg | ORAL_CAPSULE | Freq: Two times a day (BID) | ORAL | Status: DC | PRN

## 2014-07-31 MED ORDER — DULOXETINE HCL 30 MG PO CPEP: 30.0000 mg | ORAL_CAPSULE | Freq: Every day | ORAL | Status: DC

## 2014-07-31 MED ORDER — ESOMEPRAZOLE MAGNESIUM 40 MG PO CPDR: 40.0000 mg | DELAYED_RELEASE_CAPSULE | Freq: Every day | ORAL | Status: DC

## 2014-07-31 MED ORDER — FENTANYL 75 MCG/HR TD PT72: 75.0000 ug | MEDICATED_PATCH | TRANSDERMAL | Status: DC

## 2014-07-31 NOTE — Telephone Encounter (Signed)
Printed and placed up front .

## 2014-08-04 ENCOUNTER — Other Ambulatory Visit: Payer: Self-pay | Admitting: Geriatric Medicine

## 2014-08-04 MED ORDER — DULOXETINE HCL 30 MG PO CPEP: 30.0000 mg | ORAL_CAPSULE | Freq: Every day | ORAL | Status: DC

## 2014-08-06 DIAGNOSIS — J449 Chronic obstructive pulmonary disease, unspecified: Secondary | ICD-10-CM | POA: Diagnosis not present

## 2014-08-11 ENCOUNTER — Encounter: Payer: PRIVATE HEALTH INSURANCE | Admitting: Internal Medicine

## 2014-08-25 ENCOUNTER — Encounter: Payer: Self-pay | Admitting: Internal Medicine

## 2014-08-25 ENCOUNTER — Ambulatory Visit (INDEPENDENT_AMBULATORY_CARE_PROVIDER_SITE_OTHER): Payer: Medicare Other | Admitting: Internal Medicine

## 2014-08-25 VITALS — BP 122/60 | HR 121 | Temp 97.8°F | Resp 20 | Ht 67.0 in | Wt 101.4 lb

## 2014-08-25 DIAGNOSIS — G894 Chronic pain syndrome: Secondary | ICD-10-CM | POA: Diagnosis not present

## 2014-08-25 DIAGNOSIS — F418 Other specified anxiety disorders: Secondary | ICD-10-CM | POA: Diagnosis not present

## 2014-08-25 DIAGNOSIS — Z79891 Long term (current) use of opiate analgesic: Secondary | ICD-10-CM | POA: Diagnosis not present

## 2014-08-25 DIAGNOSIS — Z79899 Other long term (current) drug therapy: Secondary | ICD-10-CM | POA: Diagnosis not present

## 2014-08-25 MED ORDER — POLYETHYLENE GLYCOL 3350 17 GM/SCOOP PO POWD: ORAL | Status: DC

## 2014-08-25 MED ORDER — FENTANYL 100 MCG/HR TD PT72: 100.0000 ug | MEDICATED_PATCH | TRANSDERMAL | Status: DC

## 2014-08-25 MED ORDER — OXYCODONE HCL 5 MG PO CAPS: 5.0000 mg | ORAL_CAPSULE | Freq: Two times a day (BID) | ORAL | Status: DC | PRN

## 2014-08-25 MED ORDER — DULOXETINE HCL 60 MG PO CPEP: 60.0000 mg | ORAL_CAPSULE | Freq: Every day | ORAL | Status: DC

## 2014-08-25 NOTE — Progress Notes (Signed)
   Subjective:    Patient ID: [name], female    DOB: May 29, 1961, 54 y.o.   MRN: [id number]  HPI The patient is a 54 YO female who is coming in for follow up. She is coming in with her chronic pain and anxiety. She is not getting as much relief from her fentanyl patch but needs to oxy-ir several times a day. She is taking the xanax several times a day as well right now since her son in law was recently in a crash and on life support. He is still dealing with several complications of that including kidney problems and a blood infection. This has caused her to have more stress. No problems with the duloxetine so far. She has been eating a little better and has gained a small amount of weight which is a success for her.   Review of Systems  Constitutional: Positive for activity change and appetite change. Negative for fever and fatigue.  HENT: Negative.   Respiratory: Negative for chest tightness, shortness of breath and wheezing.   Cardiovascular: Negative for palpitations.  Musculoskeletal: Positive for back pain and arthralgias. Negative for gait problem.      Objective:   Physical Exam  Constitutional: She is oriented to person, place, and time. She appears well-developed.  Thin  HENT:  Head: Normocephalic and atraumatic.  Eyes: EOM are normal.  Neck: Normal range of motion.  Cardiovascular: Normal rate and regular rhythm.   Pulmonary/Chest: Effort normal and breath sounds normal.  Abdominal: Soft. Bowel sounds are normal.  Neurological: She is alert and oriented to person, place, and time.  Skin: Skin is warm and dry.   Filed Vitals:   08/25/14 1328  BP: 122/60  Pulse: 121  Temp: 97.8 F (36.6 C)  TempSrc: Oral  Resp: 20  Height: 5\' 7"  (1.702 m)  Weight: 101 lb 6.4 oz (45.995 kg)  SpO2: 95%      Assessment & Plan:

## 2014-08-25 NOTE — Assessment & Plan Note (Signed)
Patient is requesting for change in her medications. Will increase fentanyl to 100 mcg patch and oxy-ir amount to #60 per month. Will not change xanax dosing but will increase duloxetine to 60 mg daily to see if this can help more for her pain and her mood.

## 2014-08-25 NOTE — Patient Instructions (Signed)
We will increase the strength of the patch for pain so that should be better. We will also give you more of the pain medicine to use if you need it.   1. We are increasing the fentanyl patch strength.  2. We will increase the amount of the oxycodone so that you can take 2 pills everyday if you need them.  3. We are not going to increase the xanax but we will increase the anxiety and mood medicine.  4. Duloxetine (also called cymbalta) increase to taking 2 pills (30 mg each) a day of that. When you get a refill make sure to ask for the higher strength. Then you can take 1 of the stronger pills a day (60 mg).  We will see you back in about 3-4 months to see how you are doing.

## 2014-08-25 NOTE — Progress Notes (Signed)
Pre visit review using our clinic review tool, if applicable. No additional management support is needed unless otherwise documented below in the visit note. 

## 2014-08-25 NOTE — Assessment & Plan Note (Signed)
Taking duloxetine 30 mg daily with no problems, will increase to 60 mg daily.

## 2014-08-27 ENCOUNTER — Encounter: Payer: PRIVATE HEALTH INSURANCE | Admitting: Obstetrics & Gynecology

## 2014-09-06 DIAGNOSIS — J449 Chronic obstructive pulmonary disease, unspecified: Secondary | ICD-10-CM | POA: Diagnosis not present

## 2014-09-15 ENCOUNTER — Encounter: Payer: Self-pay | Admitting: Internal Medicine

## 2014-09-22 ENCOUNTER — Ambulatory Visit: Payer: PRIVATE HEALTH INSURANCE | Admitting: Internal Medicine

## 2014-09-25 ENCOUNTER — Telehealth: Payer: Self-pay | Admitting: Internal Medicine

## 2014-09-25 ENCOUNTER — Other Ambulatory Visit: Payer: Self-pay | Admitting: Geriatric Medicine

## 2014-09-25 DIAGNOSIS — G894 Chronic pain syndrome: Secondary | ICD-10-CM

## 2014-09-25 MED ORDER — OXYCODONE HCL 5 MG PO CAPS: 5.0000 mg | ORAL_CAPSULE | Freq: Two times a day (BID) | ORAL | Status: DC | PRN

## 2014-09-25 MED ORDER — FENTANYL 100 MCG/HR TD PT72: 100.0000 ug | MEDICATED_PATCH | TRANSDERMAL | Status: DC

## 2014-09-25 NOTE — Telephone Encounter (Signed)
Patient requesting refill of meds oxycodone (OXY-IR) 5 MG capsule [[id number]]  fentaNYL (DURAGESIC - DOSED MCG/HR) 100 MCG/HR [[id number]]

## 2014-09-25 NOTE — Telephone Encounter (Signed)
Called patient and informed her to come pick up prescriptions.

## 2014-09-29 ENCOUNTER — Encounter: Payer: Self-pay | Admitting: Obstetrics & Gynecology

## 2014-09-29 ENCOUNTER — Telehealth: Payer: Self-pay | Admitting: Internal Medicine

## 2014-09-29 ENCOUNTER — Other Ambulatory Visit (HOSPITAL_COMMUNITY)
Admission: RE | Admit: 2014-09-29 | Discharge: 2014-09-29 | Disposition: A | Discharge status: Home / Self Care | Payer: Medicare Other | Source: Ambulatory Visit | Attending: Obstetrics & Gynecology | Admitting: Obstetrics & Gynecology

## 2014-09-29 ENCOUNTER — Ambulatory Visit (INDEPENDENT_AMBULATORY_CARE_PROVIDER_SITE_OTHER): Payer: Medicare Other | Admitting: Obstetrics & Gynecology

## 2014-09-29 VITALS — BP 99/74 | HR 94 | Temp 97.7°F | Ht 67.0 in | Wt 102.7 lb

## 2014-09-29 DIAGNOSIS — Z124 Encounter for screening for malignant neoplasm of cervix: Secondary | ICD-10-CM | POA: Insufficient documentation

## 2014-09-29 DIAGNOSIS — Z1151 Encounter for screening for human papillomavirus (HPV): Secondary | ICD-10-CM | POA: Insufficient documentation

## 2014-09-29 DIAGNOSIS — F12980 Cannabis use, unspecified with anxiety disorder: Secondary | ICD-10-CM

## 2014-09-29 DIAGNOSIS — Z1239 Encounter for other screening for malignant neoplasm of breast: Secondary | ICD-10-CM

## 2014-09-29 DIAGNOSIS — Z01419 Encounter for gynecological examination (general) (routine) without abnormal findings: Secondary | ICD-10-CM | POA: Diagnosis not present

## 2014-09-29 MED ORDER — ALPRAZOLAM 1 MG PO TABS: 1.0000 mg | ORAL_TABLET | Freq: Two times a day (BID) | ORAL | Status: DC | PRN

## 2014-09-29 NOTE — Progress Notes (Signed)
Patient ID: [name], female   DOB: Nov 09, 1960, 54 y.o.   MRN: [id number]  Chief Complaint  Patient presents with  . Gynecologic Exam   [phone]  HPI [name] is a 54 y.o. female.  Patient's last menstrual period was 09/29/2003. Here for well woman exam, has severe COPD  HPI  Past Medical History  Diagnosis Date  . Anxiety   . Depression   . Cervical cancer     Discovered by pap smear, treated by Dr. Charlesetta Garibaldi with LEEP procedure.  Pap since that time has been normal.  . Chronic back pain      her CT of the head without contrast June 28, 2008- moderate to large right paracentral disc protrusion at C4-C5 resulting in a right C5 neuroforaminal stenosis and probably some degree of spinal stenosis. C. bilateral C6 neuroforaminal stenosis related to  degenerative hypertrophy, no acute fracture or loose pieces identifying the cervical spine, ligamentous injury is not excluded.    Marland Kitchen Headache(784.0)   . COPD (chronic obstructive pulmonary disease)   . Arthritis   . GERD (gastroesophageal reflux disease)  October 2011     [facility], esophagogram - tiny amount of gastroesophageal reflux otherwise unremarkable exam, done by Dr. Dellis Filbert  . Osteoporosis     T score L femur neck = -2.6 (02/21/12)  . Fibromyalgia   . Hemorrhoids     Past Surgical History  Procedure Laterality Date  . Cervical cone biopsy      leep procedure  . Multiple extractions with alveoloplasty  06/18/2012    Procedure: MULTIPLE EXTRACION WITH ALVEOLOPLASTY;  Surgeon: Gae Bon, DDS;  Location: [facility];  Service: Oral Surgery;  Laterality: Bilateral;    Family History  Problem Relation Age of Onset  . Stroke Mother   . Diabetes Mother   . Heart disease Father   . Lung cancer Daughter     Social History History  Substance Use Topics  . Smoking status: Current Every Day Smoker -- 1.00 packs/day for 34 years    Types: Cigarettes  . Smokeless tobacco: Never Used     Comment: hx drugs non since  95/ TRYING TO QUITS/ HAS CUT BACK  . Alcohol Use: No    Allergies  Allergen Reactions  . Aripiprazole Other (See Comments)    Suicidal ideation, also with SSRI  . Chantix [Varenicline] Other (See Comments)    Irritability and depressed mood  . Pregabalin Hives    On trunk of body  . Zolpidem Tartrate Other (See Comments)    unknown    Current Outpatient Prescriptions  Medication Sig Dispense Refill  . budesonide-formoterol (SYMBICORT) 160-4.5 MCG/ACT inhaler Inhale 2 puffs into the lungs 2 (two) times daily. 1 Inhaler 11  . DULoxetine (CYMBALTA) 60 MG capsule Take 1 capsule (60 mg total) by mouth daily. 90 capsule 2  . esomeprazole (NEXIUM) 40 MG capsule Take 1 capsule (40 mg total) by mouth daily. 30 capsule 3  . fentaNYL (DURAGESIC - DOSED MCG/HR) 100 MCG/HR Place 1 patch (100 mcg total) onto the skin every 3 (three) days. 10 patch 0  . oxycodone (OXY-IR) 5 MG capsule Take 1 capsule (5 mg total) by mouth 2 (two) times daily as needed for pain. 30 day supply 60 capsule 0  . polyethylene glycol (MIRALAX / GLYCOLAX) packet Take 17 g by mouth daily as needed for mild constipation.    Marland Kitchen ALPRAZolam (XANAX) 1 MG tablet Take 1 tablet (1 mg total) by mouth 2 (two) times  daily as needed for anxiety. 60 tablet 2  . polyethylene glycol powder (GLYCOLAX/MIRALAX) powder Take one cap full daily (Patient not taking: Reported on 09/29/2014) 255 g 3   Current Facility-Administered Medications  Medication Dose Route Frequency Provider Last Rate Last Dose  . cyanocobalamin ((VITAMIN B-12)) injection 1,000 mcg  1,000 mcg Intramuscular Q30 days Acquanetta Chain, DO   1,000 mcg at 02/24/11 1218    Review of Systems Review of Systems  Constitutional: Positive for fatigue.  Respiratory: Positive for cough and shortness of breath.   Genitourinary: Negative for vaginal bleeding, vaginal discharge and pelvic pain.    Blood pressure 99/74, pulse 94, temperature 97.7 F (36.5 C), temperature source  Oral, height 5\' 7"  (1.702 m), weight 102 lb 11.2 oz (46.584 kg), last menstrual period 09/29/2003.  Physical Exam Physical Exam  Constitutional: She is oriented to person, place, and time.  underweight  Cardiovascular: Normal rate.   Pulmonary/Chest: Effort normal.  Abdominal: Soft. She exhibits distension. There is no tenderness.  Genitourinary: Vagina normal and uterus normal. No vaginal discharge found.  Pap, no mass, atrophy noted  Neurological: She is alert and oriented to person, place, and time.  Skin: Skin is warm and dry.  Psychiatric: She has a normal mood and affect. Her behavior is normal.    Data Reviewed Notes, pap  Assessment    Routine Gyn exam and h/o cervical dysplasia     Plan    Screening mammogram, pap        Avree Szczygiel 09/29/2014, 3:56 PM

## 2014-09-29 NOTE — Patient Instructions (Signed)
Mammography Mammography is an X-ray of the breasts to look for changes that are not normal. The X-ray image is called a mammogram. This procedure can screen for breast cancer, can detect cancer early, and can diagnose cancer.  LET YOUR CAREGIVER KNOW ABOUT:  Breast implants.  Previous breast disease, biopsy, or surgery.  If you are breastfeeding.  Medicines taken, including vitamins, herbs, eyedrops, over-the-counter medicines, and creams.  Use of steroids (by mouth or creams).  Possibility of pregnancy, if this applies. RISKS AND COMPLICATIONS  Exposure to radiation, but at very low levels.  The results may be misinterpreted.  The results may not be accurate.  Mammography may lead to further tests.  Mammography may not catch certain cancers. BEFORE THE PROCEDURE  Schedule your test about 7 days after your menstrual period. This is when your breasts are the least tender and have signs of hormone changes.  If you have had a mammography done at a different facility in the past, get the mammogram X-rays or have them sent to your current exam facility in order to compare them.  Wash your breasts and under your arms the day of the test.  Do not wear deodorants, perfumes, or powders anywhere on your body.  Wear clothes that you can change in and out of easily. PROCEDURE Relax as much as possible during the test. Any discomfort during the test will be very brief. The test should take less than 30 minutes. The following will happen:  You will undress from the waist up and put on a gown.  You will stand in front of the X-ray machine.  Each breast will be placed between 2 plastic or glass plates. The plates will compress your breast for a few seconds.  X-rays will be taken from different angles of the breast. AFTER THE PROCEDURE  The mammogram will be examined.  Depending on the quality of the images, you may need to repeat certain parts of the test.  Ask when your test  results will be ready. Make sure you get your test results.  You may resume normal activities. Document Released: 07/15/2000 Document Revised: 10/10/2011 Document Reviewed: 05/08/2011 Exit[employer] Patient Information 2015 [employer]. This information is not intended to replace advice given to you by your health care provider. Make sure you discuss any questions you have with your health care provider.  

## 2014-09-29 NOTE — Telephone Encounter (Signed)
Done

## 2014-09-29 NOTE — Telephone Encounter (Signed)
Pt called in requesting refill on ALPRAZolam Duanne Moron) 1 MG tablet [[id number]]   [facility]

## 2014-09-29 NOTE — Telephone Encounter (Signed)
rx faxed

## 2014-09-30 LAB — CYTOLOGY - PAP

## 2014-10-05 DIAGNOSIS — J449 Chronic obstructive pulmonary disease, unspecified: Secondary | ICD-10-CM | POA: Diagnosis not present

## 2014-10-06 ENCOUNTER — Ambulatory Visit
Admission: RE | Admit: 2014-10-06 | Discharge: 2014-10-06 | Disposition: A | Discharge status: Home / Self Care | Payer: Medicare Other | Source: Ambulatory Visit | Attending: Obstetrics & Gynecology | Admitting: Obstetrics & Gynecology

## 2014-10-06 DIAGNOSIS — Z1231 Encounter for screening mammogram for malignant neoplasm of breast: Secondary | ICD-10-CM | POA: Diagnosis not present

## 2014-10-06 DIAGNOSIS — Z1239 Encounter for other screening for malignant neoplasm of breast: Secondary | ICD-10-CM

## 2014-10-07 ENCOUNTER — Other Ambulatory Visit: Payer: Self-pay | Admitting: Obstetrics & Gynecology

## 2014-10-07 DIAGNOSIS — R928 Other abnormal and inconclusive findings on diagnostic imaging of breast: Secondary | ICD-10-CM

## 2014-10-13 ENCOUNTER — Telehealth: Payer: Self-pay | Admitting: Internal Medicine

## 2014-10-13 NOTE — Telephone Encounter (Signed)
Patient is requesting to speak to you, even if it is after hours. She refuses to speak to amy. She is having adverse side effects with DULoxetine (CYMBALTA) 60 MG capsule [[id number]] , and states that she has questions of a personal matter.

## 2014-10-14 ENCOUNTER — Other Ambulatory Visit: Payer: Self-pay | Admitting: Obstetrics & Gynecology

## 2014-10-14 ENCOUNTER — Other Ambulatory Visit: Payer: Self-pay

## 2014-10-14 DIAGNOSIS — R928 Other abnormal and inconclusive findings on diagnostic imaging of breast: Secondary | ICD-10-CM

## 2014-10-14 NOTE — Telephone Encounter (Signed)
Yes, she can stop the cymbalta. Ok with visit with [name].

## 2014-10-14 NOTE — Telephone Encounter (Signed)
Spoke with patient and informed her of her appt tomorrow with [name] and also asked her to stop the cymbalta.

## 2014-10-14 NOTE — Telephone Encounter (Signed)
Spoke with patient. She said she is still having congestion after taking Mucinex for a week. She also said since starting cymbalta she is having bad moods and bad thoughts. She said she knows not to act on these thoughts. I told her we need to have her come in. She said she has an appt at [facility] tomorrow and she can come in after that. We scheduled her with [name] tomorrow at 3:30. Should she d/c cymbalta until she comes in tomorrow? Please advise, thanks.

## 2014-10-15 ENCOUNTER — Ambulatory Visit
Admission: RE | Admit: 2014-10-15 | Discharge: 2014-10-15 | Disposition: A | Discharge status: Home / Self Care | Payer: Medicare Other | Source: Ambulatory Visit | Attending: Obstetrics & Gynecology | Admitting: Obstetrics & Gynecology

## 2014-10-15 ENCOUNTER — Ambulatory Visit: Payer: Medicare Other | Admitting: Family

## 2014-10-15 DIAGNOSIS — R928 Other abnormal and inconclusive findings on diagnostic imaging of breast: Secondary | ICD-10-CM

## 2014-10-15 DIAGNOSIS — R922 Inconclusive mammogram: Secondary | ICD-10-CM | POA: Diagnosis not present

## 2014-10-20 ENCOUNTER — Other Ambulatory Visit: Payer: Self-pay | Admitting: Geriatric Medicine

## 2014-10-20 NOTE — Telephone Encounter (Signed)
Spoke with patient and she will come pick up on 10/21/14

## 2014-10-20 NOTE — Telephone Encounter (Signed)
Patient would like to know if she could come pick up her prescriptions for Oxycodone 5 mg Fentanyl 100 mcg today if possible, please advise

## 2014-10-21 ENCOUNTER — Other Ambulatory Visit: Payer: Self-pay | Admitting: Geriatric Medicine

## 2014-10-21 DIAGNOSIS — G894 Chronic pain syndrome: Secondary | ICD-10-CM

## 2014-10-21 MED ORDER — FENTANYL 100 MCG/HR TD PT72: 100.0000 ug | MEDICATED_PATCH | TRANSDERMAL | Status: DC

## 2014-10-21 MED ORDER — OXYCODONE HCL 5 MG PO CAPS: 5.0000 mg | ORAL_CAPSULE | Freq: Two times a day (BID) | ORAL | Status: DC | PRN

## 2014-10-21 NOTE — Telephone Encounter (Signed)
Printed and waiting for signature. 

## 2014-10-21 NOTE — Telephone Encounter (Signed)
Called patient and informed her that she can come pick up the prescriptions.

## 2014-11-05 DIAGNOSIS — J449 Chronic obstructive pulmonary disease, unspecified: Secondary | ICD-10-CM | POA: Diagnosis not present

## 2014-11-18 ENCOUNTER — Telehealth: Payer: Self-pay | Admitting: Internal Medicine

## 2014-11-18 ENCOUNTER — Other Ambulatory Visit: Payer: Self-pay | Admitting: Internal Medicine

## 2014-11-18 DIAGNOSIS — F12980 Cannabis use, unspecified with anxiety disorder: Secondary | ICD-10-CM

## 2014-11-18 DIAGNOSIS — G894 Chronic pain syndrome: Secondary | ICD-10-CM

## 2014-11-18 MED ORDER — FENTANYL 100 MCG/HR TD PT72
100.0000 ug | MEDICATED_PATCH | TRANSDERMAL | Status: DC
Start: 2014-11-18 — End: 2014-12-19

## 2014-11-18 MED ORDER — ALPRAZOLAM 1 MG PO TABS: 1.0000 mg | ORAL_TABLET | Freq: Two times a day (BID) | ORAL | Status: DC | PRN

## 2014-11-18 MED ORDER — BUDESONIDE-FORMOTEROL FUMARATE 160-4.5 MCG/ACT IN AERO: 2.0000 | INHALATION_SPRAY | Freq: Two times a day (BID) | RESPIRATORY_TRACT | Status: DC

## 2014-11-18 MED ORDER — OXYCODONE HCL 5 MG PO CAPS: 5.0000 mg | ORAL_CAPSULE | Freq: Two times a day (BID) | ORAL | Status: DC | PRN

## 2014-11-18 NOTE — Telephone Encounter (Signed)
Patient called wanting refills to many of her prescriptions. I did notice one request today for esomeprazole ([place]) 40 MG capsule [[id number], maybe from pharmacist which was on her list. She is scheduled to come in on 4/28 and she said she will run out by then. [facility] [address]. The other medications are budesonide-formoterol (SYMBICORT) 160-4.5 MCG/ACT inhaler [[id number]] , polyethylene glycol powder (GLYCOLAX/MIRALAX) powder [[id number]] , ALPRAZolam (XANAX) 1 MG tablet [[id number], oxycodone (OXY-IR) 5 MG capsule [[id number], and fentaNYL (DURAGESIC - DOSED MCG/HR) 100 MCG/HR [[id number]]

## 2014-11-18 NOTE — Telephone Encounter (Signed)
Called patient. She will pick up Thursday and a note has been left for a uds.

## 2014-11-18 NOTE — Telephone Encounter (Signed)
Do you want to refill her, Alprazolam, Oxycodone, and Fentanyl? Please advise, thanks. I already sent the others to her pharmacy.

## 2014-11-18 NOTE — Telephone Encounter (Signed)
Please call and let her know she can pick up on Thursday. Leave note for front (not patient) that she needs urine drug screen to get scripts.

## 2014-11-18 NOTE — Telephone Encounter (Signed)
Spoke with patient. She will pick up the prescriptions Thursday. The other prescriptions were sent to the pharmacy.

## 2014-11-21 DIAGNOSIS — Z79891 Long term (current) use of opiate analgesic: Secondary | ICD-10-CM | POA: Diagnosis not present

## 2014-11-21 DIAGNOSIS — Z79899 Other long term (current) drug therapy: Secondary | ICD-10-CM | POA: Diagnosis not present

## 2014-11-27 ENCOUNTER — Ambulatory Visit (INDEPENDENT_AMBULATORY_CARE_PROVIDER_SITE_OTHER): Payer: Medicare Other | Admitting: Internal Medicine

## 2014-11-27 ENCOUNTER — Encounter: Payer: Self-pay | Admitting: Internal Medicine

## 2014-11-27 VITALS — BP 120/80 | HR 87 | Temp 97.6°F | Resp 18 | Wt 102.0 lb

## 2014-11-27 DIAGNOSIS — J449 Chronic obstructive pulmonary disease, unspecified: Secondary | ICD-10-CM

## 2014-11-27 DIAGNOSIS — Z79899 Other long term (current) drug therapy: Secondary | ICD-10-CM | POA: Diagnosis not present

## 2014-11-27 DIAGNOSIS — G894 Chronic pain syndrome: Secondary | ICD-10-CM | POA: Diagnosis not present

## 2014-11-27 DIAGNOSIS — Z79891 Long term (current) use of opiate analgesic: Secondary | ICD-10-CM | POA: Diagnosis not present

## 2014-11-27 MED ORDER — TIOTROPIUM BROMIDE MONOHYDRATE 18 MCG IN CAPS: 18.0000 ug | ORAL_CAPSULE | Freq: Every day | RESPIRATORY_TRACT | Status: DC

## 2014-11-27 MED ORDER — GABAPENTIN 300 MG PO CAPS: 300.0000 mg | ORAL_CAPSULE | Freq: Two times a day (BID) | ORAL | Status: DC

## 2014-11-27 NOTE — Progress Notes (Signed)
Pre visit review using our clinic review tool, if applicable. No additional management support is needed unless otherwise documented below in the visit note. 

## 2014-11-27 NOTE — Patient Instructions (Signed)
We are going to start a new medicine for the breathing called spiriva which should help you to breathe better. The breathing will likely continue to get worse if you keep smoking as smoking damages the lungs.   We will add another medicine for the pain which is called gabapentin. This is a medicine that works on the nerves and helps to reduce pain all over the body.   Come back in about 3-4 months and call us if the breathing does not get better, you may need to see a lung specialist to see if they can help you more.

## 2014-11-30 NOTE — Assessment & Plan Note (Addendum)
UDS negative for xanax last time at pick up for prescription. Asked her if she runs out early due to taking extra but she denies. Recheck UDS today (picked up meds last week). UDS was appropriate for her pain medicaitons. Since she has stopped taking cymbalta will add gabapentin for better pain control.

## 2014-11-30 NOTE — Progress Notes (Signed)
   Subjective:    Patient ID: [name], female    DOB: 29-May-1961, 54 y.o.   MRN: [id number]  HPI The patient is a 54 YO female who is coming in for increasing SOB in the last few days. Her energy is gradually decreased from SOB with exertion. She has known COPD and has been taking her symbicort daily. She does not think it is working well. She is still smoking. Denies increased cough, sputum production. Denies fevers or chills. She does not feel like she can stop smoking as she was able to get off other things with the cigarettes.   Review of Systems  Constitutional: Positive for activity change and appetite change. Negative for fever and fatigue.  Respiratory: Positive for shortness of breath. Negative for cough, chest tightness and wheezing.   Cardiovascular: Negative for chest pain, palpitations and leg swelling.  Musculoskeletal: Positive for back pain and arthralgias. Negative for gait problem.      Objective:   Physical Exam  Constitutional: She is oriented to person, place, and time. She appears well-developed.  Thin  HENT:  Head: Normocephalic and atraumatic.  Eyes: EOM are normal.  Neck: Normal range of motion.  Cardiovascular: Normal rate and regular rhythm.   Pulmonary/Chest: Effort normal and breath sounds normal. She has no wheezes. She has no rales.  Abdominal: Soft. Bowel sounds are normal.  Neurological: She is alert and oriented to person, place, and time.  Skin: Skin is warm and dry.   Filed Vitals:   11/27/14 1305  BP: 120/80  Pulse: 87  Temp: 97.6 F (36.4 C)  TempSrc: Oral  Resp: 18  Weight: 102 lb (46.267 kg)  SpO2: 95%      Assessment & Plan:

## 2014-11-30 NOTE — Assessment & Plan Note (Signed)
Might benefit from pulmonary rehab but she declines at this time feeling as though she does not have enough energy. Add spiriva to her symbicort daily. She is not in clear exacerbation today but feel this represents worsening given her ongoing smoking. Talked to her about the fact that her smoking is worsening her lungs and will keep her from having any stamina or lung capacity but she does not feel able to quit at this time.

## 2014-12-05 DIAGNOSIS — J449 Chronic obstructive pulmonary disease, unspecified: Secondary | ICD-10-CM | POA: Diagnosis not present

## 2014-12-08 ENCOUNTER — Encounter: Payer: Self-pay | Admitting: Internal Medicine

## 2014-12-10 ENCOUNTER — Telehealth: Payer: Self-pay | Admitting: Internal Medicine

## 2014-12-10 NOTE — Telephone Encounter (Signed)
Pt called in and would like a [facility],  She said that she cant so anywhere because she only has the tank for home.   [phone]

## 2014-12-11 NOTE — Telephone Encounter (Signed)
I did not realize she needed oxygen. Would need visit for 10 minute walk to see if she desaturates.

## 2014-12-11 NOTE — Telephone Encounter (Signed)
Patient said she will do it in July during her appointment.

## 2014-12-11 NOTE — Telephone Encounter (Signed)
Is this something you can do without an office visit?

## 2014-12-15 ENCOUNTER — Encounter: Payer: Self-pay | Admitting: Internal Medicine

## 2014-12-19 ENCOUNTER — Telehealth: Payer: Self-pay | Admitting: Internal Medicine

## 2014-12-19 DIAGNOSIS — G894 Chronic pain syndrome: Secondary | ICD-10-CM

## 2014-12-19 MED ORDER — OXYCODONE HCL 5 MG PO CAPS: 5.0000 mg | ORAL_CAPSULE | Freq: Two times a day (BID) | ORAL | Status: DC | PRN

## 2014-12-19 MED ORDER — FENTANYL 100 MCG/HR TD PT72: 100.0000 ug | MEDICATED_PATCH | TRANSDERMAL | Status: DC

## 2014-12-19 NOTE — Telephone Encounter (Signed)
Printed and signed, she can pick up Monday.

## 2014-12-19 NOTE — Telephone Encounter (Signed)
Patient aware.

## 2014-12-19 NOTE — Telephone Encounter (Signed)
Patient is requesting script for oxycodone and fentanyl.

## 2015-01-05 DIAGNOSIS — J449 Chronic obstructive pulmonary disease, unspecified: Secondary | ICD-10-CM | POA: Diagnosis not present

## 2015-01-19 ENCOUNTER — Telehealth: Payer: Self-pay | Admitting: Internal Medicine

## 2015-01-19 DIAGNOSIS — G894 Chronic pain syndrome: Secondary | ICD-10-CM

## 2015-01-19 MED ORDER — FENTANYL 100 MCG/HR TD PT72: 100.0000 ug | MEDICATED_PATCH | TRANSDERMAL | Status: DC

## 2015-01-19 MED ORDER — OXYCODONE HCL 5 MG PO CAPS: 5.0000 mg | ORAL_CAPSULE | Freq: Two times a day (BID) | ORAL | Status: DC | PRN

## 2015-01-19 NOTE — Telephone Encounter (Signed)
Printed and signed.  

## 2015-01-19 NOTE — Telephone Encounter (Signed)
Patient aware and will come pick up 

## 2015-01-19 NOTE — Telephone Encounter (Signed)
Patient requesting refill for fentaNYL (DURAGESIC - DOSED MCG/HR) 100 MCG/HR [[id number]] and oxycodone (OXY-IR) 5 MG capsule [[id number]. Please call patient when ready

## 2015-02-04 DIAGNOSIS — J449 Chronic obstructive pulmonary disease, unspecified: Secondary | ICD-10-CM | POA: Diagnosis not present

## 2015-02-16 ENCOUNTER — Telehealth: Payer: Self-pay | Admitting: Internal Medicine

## 2015-02-16 ENCOUNTER — Encounter: Payer: Medicare Other | Admitting: Emergency Medicine

## 2015-02-16 DIAGNOSIS — G894 Chronic pain syndrome: Secondary | ICD-10-CM

## 2015-02-16 DIAGNOSIS — F12980 Cannabis use, unspecified with anxiety disorder: Secondary | ICD-10-CM

## 2015-02-16 MED ORDER — ALPRAZOLAM 1 MG PO TABS: 1.0000 mg | ORAL_TABLET | Freq: Two times a day (BID) | ORAL | Status: DC | PRN

## 2015-02-16 MED ORDER — OXYCODONE HCL 5 MG PO CAPS: 5.0000 mg | ORAL_CAPSULE | Freq: Two times a day (BID) | ORAL | Status: DC | PRN

## 2015-02-16 MED ORDER — FENTANYL 100 MCG/HR TD PT72: 100.0000 ug | MEDICATED_PATCH | TRANSDERMAL | Status: DC

## 2015-02-16 NOTE — Telephone Encounter (Signed)
Advised patient that rx's are ready for pick up at front office, patient stated if she does not come by today, she will come by tomorrow

## 2015-02-16 NOTE — Telephone Encounter (Signed)
Please advise, thanks.

## 2015-02-16 NOTE — Telephone Encounter (Signed)
Patient is requesting scripts of fentanyl, oxycodone and xanax.

## 2015-02-16 NOTE — Telephone Encounter (Signed)
Medications refilled. [place] Controlled Substance Database reviewed and medications adjusted to meet fill dates.

## 2015-02-19 NOTE — Progress Notes (Signed)
This encounter was created in error - please disregard.

## 2015-02-26 ENCOUNTER — Other Ambulatory Visit (INDEPENDENT_AMBULATORY_CARE_PROVIDER_SITE_OTHER): Payer: Medicare Other

## 2015-02-26 ENCOUNTER — Encounter: Payer: Self-pay | Admitting: Internal Medicine

## 2015-02-26 ENCOUNTER — Ambulatory Visit (INDEPENDENT_AMBULATORY_CARE_PROVIDER_SITE_OTHER): Payer: Medicare Other | Admitting: Internal Medicine

## 2015-02-26 VITALS — BP 100/70 | HR 96 | Temp 98.1°F | Resp 12 | Ht 67.0 in | Wt 91.0 lb

## 2015-02-26 DIAGNOSIS — E538 Deficiency of other specified B group vitamins: Secondary | ICD-10-CM

## 2015-02-26 DIAGNOSIS — J449 Chronic obstructive pulmonary disease, unspecified: Secondary | ICD-10-CM | POA: Diagnosis not present

## 2015-02-26 DIAGNOSIS — F418 Other specified anxiety disorders: Secondary | ICD-10-CM

## 2015-02-26 DIAGNOSIS — R634 Abnormal weight loss: Secondary | ICD-10-CM | POA: Diagnosis not present

## 2015-02-26 DIAGNOSIS — E43 Unspecified severe protein-calorie malnutrition: Secondary | ICD-10-CM

## 2015-02-26 LAB — COMPREHENSIVE METABOLIC PANEL
ALT: 6 U/L (ref 0–35)
AST: 14 U/L (ref 0–37)
Albumin: 4.8 g/dL (ref 3.5–5.2)
Alkaline Phosphatase: 77 U/L (ref 39–117)
BILIRUBIN TOTAL: 0.7 mg/dL (ref 0.2–1.2)
BUN: 12 mg/dL (ref 6–23)
CO2: 32 mEq/L (ref 19–32)
CREATININE: 0.75 mg/dL (ref 0.40–1.20)
Calcium: 10.1 mg/dL (ref 8.4–10.5)
Chloride: 99 mEq/L (ref 96–112)
GFR: 84.93 mL/min (ref >60.00)
GLUCOSE: 91 mg/dL (ref 70–99)
Potassium: 4.6 mEq/L (ref 3.5–5.1)
Sodium: 138 mEq/L (ref 135–145)
Total Protein: 8.2 g/dL (ref 6.0–8.3)

## 2015-02-26 LAB — CBC
HCT: 47 % — ABNORMAL HIGH (ref 36.0–46.0)
Hemoglobin: 15.7 g/dL — ABNORMAL HIGH (ref 12.0–15.0)
MCHC: 33.3 g/dL (ref 30.0–36.0)
MCV: 95.5 fl (ref 78.0–100.0)
PLATELETS: 261 10*3/uL (ref 150.0–400.0)
RBC: 4.93 Mil/uL (ref 3.87–5.11)
RDW: 14.2 % (ref 11.5–15.5)
WBC: 4.4 10*3/uL (ref 4.0–10.5)

## 2015-02-26 LAB — VITAMIN B12: Vitamin B-12: >1500 pg/mL — ABNORMAL HIGH (ref 211–911)

## 2015-02-26 MED ORDER — CYANOCOBALAMIN 1000 MCG/ML IJ SOLN
1000.0000 ug | Freq: Once | INTRAMUSCULAR | Status: AC
  Administered 2015-02-26: 1000 ug via INTRAMUSCULAR

## 2015-02-26 NOTE — Progress Notes (Signed)
Pre visit review using our clinic review tool, if applicable. No additional management support is needed unless otherwise documented below in the visit note. 

## 2015-02-26 NOTE — Assessment & Plan Note (Signed)
Suspect that her lung disease is contributing to her negative calorie balance. This is severely impacting her life. She is still an ongoing smoker and does not feel able to see a specialist right now due to not wanting to go to more appointments.

## 2015-02-26 NOTE — Progress Notes (Signed)
   Subjective:    Patient ID: [name], female    DOB: 15-Sep-1960, 55 y.o.   MRN: [id number]  HPI The patient is a 54 YO female coming in with several acute complaints. She has been losing weight since our last visit. She is having more anxiety with her family there are several ongoing concerns. She has never had a large appetite and may be eating a little less recently. Denies cough although having much less energy and more SOB. She does not feel she has any energy and lies on the cough all day long. She is able to walk from the bed to the cough without stopping for SOB. No change to her bowels. Some abdominal pain that is new in the middle. She has been trying to drink some ensure but it is very rich and she is eating it half and half with milk. After the visit she admits to the [place] that she is taking more of her pain and anxiety medication than recommended and will be out early. She has been on megace in the past for weight loss and did not notice that it helped her appetite at all. She is still smoking.   Review of Systems  Constitutional: Positive for activity change, appetite change and unexpected weight change. Negative for fever and fatigue.  Respiratory: Positive for shortness of breath. Negative for cough, chest tightness and wheezing.   Cardiovascular: Negative for chest pain, palpitations and leg swelling.  Gastrointestinal: Positive for abdominal pain. Negative for diarrhea, constipation, blood in stool and abdominal distention.  Musculoskeletal: Positive for myalgias, back pain and arthralgias. Negative for gait problem.  Psychiatric/Behavioral: Positive for behavioral problems, dysphoric mood, decreased concentration and agitation. Negative for suicidal ideas and self-injury. The patient is nervous/anxious.       Objective:   Physical Exam  Constitutional: She is oriented to person, place, and time.  Very thin and gaunt  HENT:  Head: Normocephalic and atraumatic.  Eyes: EOM  are normal.  Neck: Normal range of motion.  Cardiovascular: Normal rate and regular rhythm.   Pulmonary/Chest: Effort normal and breath sounds normal. She has no wheezes. She has no rales.  Abdominal: Soft. Bowel sounds are normal. There is tenderness.  Some tenderness midline  Neurological: She is alert and oriented to person, place, and time.  Skin: Skin is warm and dry.  Psychiatric:  Very flat affect   Filed Vitals:   02/26/15 1259  BP: 100/70  Pulse: 96  Temp: 98.1 F (36.7 C)  TempSrc: Oral  Resp: 12  Height: 5\' 7"  (1.702 m)  Weight: 91 lb (41.277 kg)  SpO2: 91%      Assessment & Plan:

## 2015-02-26 NOTE — Assessment & Plan Note (Signed)
BMI 14, drastic weight loss since last visit. 10 pounds down. She is eating less but also has many risks for lung cancer including ongoing smoking. She also has suspicious LAD in the stomach on prior CT from 2015. Will order CT chest, abdomen and pelvis to rule out malignancy. Suspect that her ongoing benzo and opioid usage is negatively affecting her health. She is not willing to quit smoking now although she is trying. Advised that she needs to drink 2 boost or ensure daily to help with her weight. Suspect that her lack of energy is her ongoing COPD and lack of calories.

## 2015-02-26 NOTE — Patient Instructions (Signed)
We will give you a shot of vitamin B12 to help some with energy. We need you to work on increasing the amount of calories that you are eating everyday. I would recommend to drink at least 2 nutritional drinks per day like boost or ensure.   We will also want to get an imaging test of the lungs, the stomach to make sure that there is nothing else going on that is causing you to lose weight.   We are going to check some labs today as well to make sure everything is okay.   Having your weight so low is dangerous and we need to work to get the weight up again as it being so low can make you feel really tired and have no energy.

## 2015-02-26 NOTE — Assessment & Plan Note (Signed)
Her BMI is dangerously low and talked to her about the importance of eating and adding boost or ensure 2 daily to help with avoiding weight loss and she needs weight gain. Her health is fragile and likely would have less than 6 month prognosis is she is not able to eat.

## 2015-02-26 NOTE — Assessment & Plan Note (Signed)
Suspect that this is also a strong contributor to her poor health and QOL right now. Have talked to her about counseling and psych referral but she does not want to go. Will not increase her xanax as she is already on high dose. She was advised not to take more than advised. She has stopped many SSRIs in the past and does not wish to take any as she did not see the benefit. I feel that she was doing better on cymbalta but she stopped taking it and will not resume. Remeron would be another good choice for the positive affect on appetite but she has tried and failed this as well. This is severe right now and uncontrolled.

## 2015-02-28 ENCOUNTER — Encounter (HOSPITAL_COMMUNITY): Payer: Self-pay | Admitting: Emergency Medicine

## 2015-02-28 ENCOUNTER — Emergency Department (INDEPENDENT_AMBULATORY_CARE_PROVIDER_SITE_OTHER)
Admission: EM | Admit: 2015-02-28 | Discharge: 2015-02-28 | Disposition: A | Discharge status: Other Acute Inpatient Hospital | Payer: Medicare Other | Source: Home / Self Care | Attending: Family Medicine | Admitting: Family Medicine

## 2015-02-28 ENCOUNTER — Encounter (HOSPITAL_COMMUNITY): Payer: Self-pay | Admitting: *Deleted

## 2015-02-28 ENCOUNTER — Emergency Department (HOSPITAL_COMMUNITY): Payer: Medicare Other

## 2015-02-28 ENCOUNTER — Emergency Department (HOSPITAL_COMMUNITY)
Admission: EM | Admit: 2015-02-28 | Discharge: 2015-03-01 | Disposition: A | Discharge status: Home / Self Care | Payer: Medicare Other | Attending: Emergency Medicine | Admitting: Emergency Medicine

## 2015-02-28 DIAGNOSIS — G8929 Other chronic pain: Secondary | ICD-10-CM | POA: Diagnosis not present

## 2015-02-28 DIAGNOSIS — F329 Major depressive disorder, single episode, unspecified: Secondary | ICD-10-CM | POA: Insufficient documentation

## 2015-02-28 DIAGNOSIS — Z8541 Personal history of malignant neoplasm of cervix uteri: Secondary | ICD-10-CM | POA: Diagnosis not present

## 2015-02-28 DIAGNOSIS — J441 Chronic obstructive pulmonary disease with (acute) exacerbation: Secondary | ICD-10-CM | POA: Insufficient documentation

## 2015-02-28 DIAGNOSIS — Z79899 Other long term (current) drug therapy: Secondary | ICD-10-CM | POA: Insufficient documentation

## 2015-02-28 DIAGNOSIS — M797 Fibromyalgia: Secondary | ICD-10-CM | POA: Diagnosis not present

## 2015-02-28 DIAGNOSIS — Z720 Tobacco use: Secondary | ICD-10-CM | POA: Diagnosis not present

## 2015-02-28 DIAGNOSIS — J449 Chronic obstructive pulmonary disease, unspecified: Secondary | ICD-10-CM | POA: Diagnosis not present

## 2015-02-28 DIAGNOSIS — R634 Abnormal weight loss: Secondary | ICD-10-CM

## 2015-02-28 DIAGNOSIS — R531 Weakness: Secondary | ICD-10-CM | POA: Diagnosis present

## 2015-02-28 DIAGNOSIS — F419 Anxiety disorder, unspecified: Secondary | ICD-10-CM | POA: Insufficient documentation

## 2015-02-28 DIAGNOSIS — F17210 Nicotine dependence, cigarettes, uncomplicated: Secondary | ICD-10-CM | POA: Diagnosis not present

## 2015-02-28 DIAGNOSIS — M1990 Unspecified osteoarthritis, unspecified site: Secondary | ICD-10-CM | POA: Insufficient documentation

## 2015-02-28 DIAGNOSIS — R112 Nausea with vomiting, unspecified: Secondary | ICD-10-CM | POA: Diagnosis not present

## 2015-02-28 DIAGNOSIS — R0602 Shortness of breath: Secondary | ICD-10-CM | POA: Diagnosis not present

## 2015-02-28 DIAGNOSIS — Z8719 Personal history of other diseases of the digestive system: Secondary | ICD-10-CM | POA: Insufficient documentation

## 2015-02-28 DIAGNOSIS — R109 Unspecified abdominal pain: Secondary | ICD-10-CM

## 2015-02-28 DIAGNOSIS — R1013 Epigastric pain: Secondary | ICD-10-CM | POA: Diagnosis not present

## 2015-02-28 LAB — CBC WITH DIFFERENTIAL/PLATELET
Basophils Absolute: 0 10*3/uL (ref 0.0–0.1)
Basophils Relative: 0 % (ref 0–1)
EOS PCT: 2 % (ref 0–5)
Eosinophils Absolute: 0.1 10*3/uL (ref 0.0–0.7)
HEMATOCRIT: 47.3 % — AB (ref 36.0–46.0)
HEMOGLOBIN: 16.3 g/dL — AB (ref 12.0–15.0)
LYMPHS PCT: 41 % (ref 12–46)
Lymphs Abs: 2.4 10*3/uL (ref 0.7–4.0)
MCH: 33 pg (ref 26.0–34.0)
MCHC: 34.5 g/dL (ref 30.0–36.0)
MCV: 95.7 fL (ref 78.0–100.0)
Monocytes Absolute: 0.5 10*3/uL (ref 0.1–1.0)
Monocytes Relative: 9 % (ref 3–12)
Neutro Abs: 2.7 10*3/uL (ref 1.7–7.7)
Neutrophils Relative %: 48 % (ref 43–77)
Platelets: 260 10*3/uL (ref 150–400)
RBC: 4.94 MIL/uL (ref 3.87–5.11)
RDW: 13.8 % (ref 11.5–15.5)
WBC: 5.8 10*3/uL (ref 4.0–10.5)

## 2015-02-28 LAB — URINALYSIS, ROUTINE W REFLEX MICROSCOPIC
Bilirubin Urine: NEGATIVE
GLUCOSE, UA: NEGATIVE mg/dL
Hgb urine dipstick: NEGATIVE
KETONES UR: 15 mg/dL — AB
NITRITE: NEGATIVE
PROTEIN: NEGATIVE mg/dL
Specific Gravity, Urine: 1.022 (ref 1.005–1.030)
UROBILINOGEN UA: 1 mg/dL (ref 0.0–1.0)
pH: 6 (ref 5.0–8.0)

## 2015-02-28 LAB — COMPREHENSIVE METABOLIC PANEL
ALK PHOS: 75 U/L (ref 38–126)
ALT: 8 U/L — ABNORMAL LOW (ref 14–54)
AST: 20 U/L (ref 15–41)
Albumin: 4.8 g/dL (ref 3.5–5.0)
Anion gap: 8 (ref 5–15)
BUN: 13 mg/dL (ref 6–20)
CHLORIDE: 98 mmol/L — AB (ref 101–111)
CO2: 31 mmol/L (ref 22–32)
CREATININE: 0.75 mg/dL (ref 0.44–1.00)
Calcium: 9.9 mg/dL (ref 8.9–10.3)
GFR calc Af Amer: >60 mL/min (ref >60)
GFR calc non Af Amer: >60 mL/min (ref >60)
GLUCOSE: 101 mg/dL — AB (ref 65–99)
Potassium: 3.6 mmol/L (ref 3.5–5.1)
Sodium: 137 mmol/L (ref 135–145)
Total Bilirubin: 0.5 mg/dL (ref 0.3–1.2)
Total Protein: 8.3 g/dL — ABNORMAL HIGH (ref 6.5–8.1)

## 2015-02-28 LAB — URINE MICROSCOPIC-ADD ON

## 2015-02-28 LAB — I-STAT TROPONIN, ED: TROPONIN I, POC: 0 ng/mL (ref 0.00–0.08)

## 2015-02-28 LAB — LIPASE, BLOOD: Lipase: 15 U/L — ABNORMAL LOW (ref 22–51)

## 2015-02-28 MED ORDER — MORPHINE SULFATE 4 MG/ML IJ SOLN
4.0000 mg | Freq: Once | INTRAMUSCULAR | Status: AC
  Administered 2015-02-28: 4 mg via INTRAVENOUS
  Filled 2015-02-28: qty 1

## 2015-02-28 MED ORDER — ONDANSETRON HCL 4 MG/2ML IJ SOLN
4.0000 mg | Freq: Once | INTRAMUSCULAR | Status: AC
  Administered 2015-02-28: 4 mg via INTRAVENOUS
  Filled 2015-02-28: qty 2

## 2015-02-28 MED ORDER — IOHEXOL 300 MG/ML  SOLN
25.0000 mL | INTRAMUSCULAR | Status: AC
  Administered 2015-02-28 (×2): 25 mL via ORAL

## 2015-02-28 MED ORDER — IOHEXOL 300 MG/ML  SOLN: 25.0000 mL | Freq: Once | INTRAMUSCULAR | Status: DC | PRN

## 2015-02-28 NOTE — ED Notes (Signed)
Patient refuses to have rectal temperature checked.

## 2015-02-28 NOTE — ED Provider Notes (Signed)
CSN: [id number]     Arrival date & time 02/28/15  2036 History   First MD Initiated Contact with Patient 02/28/15 2048     Chief Complaint  Patient presents with  . Weakness  . Fatigue     (Consider location/radiation/quality/duration/timing/severity/associated sxs/prior Treatment) HPI Comments: Patient is a 54 year old female with a past medical history of COPD, GERD, tobacco abuse, and opiate abuse who presents with abdominal pain for the past 2 days. The pain is located in the epigastric area and does not radiate. The pain is described as aching and severe. The pain started gradually and progressively worsened since the onset. No alleviating/aggravating factors. The patient has tried nothing for symptoms without relief. Associated symptoms include SOB and fatigue. Patient denies fever, headache, NVD, chest pain, dysuria, constipation, abnormal vaginal bleeding/discharge.      Past Medical History  Diagnosis Date  . Anxiety   . Depression   . Cervical cancer     Discovered by pap smear, treated by Dr. Charlesetta Garibaldi with LEEP procedure.  Pap since that time has been normal.  . Chronic back pain      her CT of the head without contrast June 28, 2008- moderate to large right paracentral disc protrusion at C4-C5 resulting in a right C5 neuroforaminal stenosis and probably some degree of spinal stenosis. C. bilateral C6 neuroforaminal stenosis related to  degenerative hypertrophy, no acute fracture or loose pieces identifying the cervical spine, ligamentous injury is not excluded.    Marland Kitchen Headache(784.0)   . COPD (chronic obstructive pulmonary disease)   . Arthritis   . GERD (gastroesophageal reflux disease)  October 2011     Williams, esophagogram - tiny amount of gastroesophageal reflux otherwise unremarkable exam, done by Dr. Dellis Filbert  . Osteoporosis     T score L femur neck = -2.6 (02/21/12)  . Fibromyalgia   . Hemorrhoids    Past Surgical History  Procedure Laterality Date  . Cervical  cone biopsy      leep procedure  . Multiple extractions with alveoloplasty  06/18/2012    Procedure: MULTIPLE EXTRACION WITH ALVEOLOPLASTY;  Surgeon: Gae Bon, DDS;  Location: [place];  Service: Oral Surgery;  Laterality: Bilateral;   Family History  Problem Relation Age of Onset  . Stroke Mother   . Diabetes Mother   . Heart disease Father   . Lung cancer Daughter    History  Substance Use Topics  . Smoking status: Current Every Day Smoker -- 1.00 packs/day for 34 years    Types: Cigarettes  . Smokeless tobacco: Never Used     Comment: hx drugs non since 95/ TRYING TO QUITS/ HAS CUT BACK  . Alcohol Use: No   OB History    Gravida Para Term Preterm AB TAB SAB Ectopic Multiple Living   1 1 1       2      Review of Systems  Constitutional: Positive for fatigue and unexpected weight change.  Respiratory: Positive for shortness of breath.   Gastrointestinal: Positive for nausea and abdominal pain.  All other systems reviewed and are negative.     Allergies  Aripiprazole; Chantix; Pregabalin; and Zolpidem tartrate  Home Medications   Prior to Admission medications   Medication Sig Start Date End Date Taking? Authorizing Provider  ALPRAZolam Duanne Moron) 1 MG tablet Take 1 tablet (1 mg total) by mouth 2 (two) times daily as needed for anxiety. 02/16/15   Golden Circle, FNP  budesonide-formoterol [facility]) 160-4.5 MCG/ACT inhaler Inhale  2 puffs into the lungs 2 (two) times daily. 11/18/14   Olga Millers, MD  fentaNYL (DURAGESIC - DOSED MCG/HR) 100 MCG/HR Place 1 patch (100 mcg total) onto the skin every 3 (three) days. 02/16/15   Golden Circle, FNP  gabapentin (NEURONTIN) 300 MG capsule Take 1 capsule (300 mg total) by mouth 2 (two) times daily. 11/27/14   Olga Millers, MD  NEXIUM 40 MG capsule TAKE 1 CAPSULE (40 MG TOTAL) BY MOUTH DAILY. 11/18/14   Olga Millers, MD  oxycodone (OXY-IR) 5 MG capsule Take 1 capsule (5 mg total) by mouth 2 (two) times daily as  needed for pain. 30 day supply 02/16/15   Golden Circle, FNP  polyethylene glycol (MIRALAX / GLYCOLAX) packet Take 17 g by mouth daily as needed for mild constipation.    Historical Provider, MD  polyethylene glycol powder (GLYCOLAX/MIRALAX) powder Take one cap full daily 08/25/14   Olga Millers, MD  tiotropium (SPIRIVA HANDIHALER) 18 MCG inhalation capsule Place 1 capsule (18 mcg total) into inhaler and inhale daily. 11/27/14   Olga Millers, MD   BP 128/94 mmHg  Pulse 91  Temp(Src) 98 F (36.7 C) (Oral)  Resp 9  SpO2 99%  LMP 09/29/2003 Physical Exam  Constitutional: She is oriented to person, place, and time. She appears well-developed and well-nourished. No distress.  Cachectic   HENT:  Head: Normocephalic and atraumatic.  Eyes: Conjunctivae and EOM are normal.  Neck: Normal range of motion.  Cardiovascular: Regular rhythm.  Exam reveals no gallop and no friction rub.   No murmur heard. Pulmonary/Chest: Effort normal and breath sounds normal. She has no wheezes. She has no rales. She exhibits no tenderness.  Abdominal: Soft. She exhibits no distension. There is tenderness. There is no rebound.  Epigastric tenderness to palpation. No other focal tenderness.   Musculoskeletal: Normal range of motion.  Neurological: She is alert and oriented to person, place, and time. Coordination normal.  Speech is goal-oriented. Moves limbs without ataxia.   Skin: Skin is warm and dry.  Psychiatric: She has a normal mood and affect. Her behavior is normal.  Nursing note and vitals reviewed.   ED Course  Procedures (including critical care time) Labs Review Labs Reviewed  CBC WITH DIFFERENTIAL/PLATELET - Abnormal; Notable for the following:    Hemoglobin 16.3 (*)    HCT 47.3 (*)    All other components within normal limits  COMPREHENSIVE METABOLIC PANEL - Abnormal; Notable for the following:    Chloride 98 (*)    Glucose, Bld 101 (*)    Total Protein 8.3 (*)    ALT 8 (*)     All other components within normal limits  LIPASE, BLOOD - Abnormal; Notable for the following:    Lipase 15 (*)    All other components within normal limits  URINALYSIS, ROUTINE W REFLEX MICROSCOPIC (NOT AT [facility]) - Abnormal; Notable for the following:    APPearance CLOUDY (*)    Ketones, ur 15 (*)    Leukocytes, UA SMALL (*)    All other components within normal limits  URINE MICROSCOPIC-ADD ON  Randolm Idol, ED    Imaging Review Dg Chest 2 View  02/28/2015   CLINICAL DATA:  Shortness of breath.  Nausea.  Anorexia.  COPD.  EXAM: CHEST  2 VIEW  COMPARISON:  05/29/2014  FINDINGS: The heart size and mediastinal contours are within normal limits. Both lungs are clear. Pulmonary hyperinflation again seen, consistent with COPD. No evidence  of pneumothorax or pleural effusion.  IMPRESSION: Stable exam.  COPD.  No active disease.   Electronically Signed   By: Earle Gell M.D.   On: 02/28/2015 21:44   Ct Abdomen Pelvis W Contrast  03/01/2015   CLINICAL DATA:  Epigastric pain, weakness, nausea and vomiting.  EXAM: CT ABDOMEN AND PELVIS WITH CONTRAST  TECHNIQUE: Multidetector CT imaging of the abdomen and pelvis was performed using the standard protocol following bolus administration of intravenous contrast.  CONTRAST:  49mL OMNIPAQUE IOHEXOL 300 MG/ML  SOLN  COMPARISON:  05/27/2014  FINDINGS: Lower chest:  No significant abnormality  Hepatobiliary: There are normal appearances of the liver, gallbladder and bile ducts.  Pancreas: Normal  Spleen: Norm  Adrenals/Urinary Tract: The adrenals and kidneys are normal in appearance. There is no urinary calculus evident. There is no hydronephrosis or ureteral dilatation. Collecting systems and ureters appear unremarkable.  Stomach/Bowel: There are normal appearances of the stomach, small bowel and colon. The appendix is normal.  Vascular/Lymphatic: The abdominal aorta is normal in caliber. There is moderate atherosclerotic calcification. There is no adenopathy in  the abdomen or pelvis.  Reproductive: The uterus and ovaries appear unremarkable.  Other: No acute inflammatory changes are evident in the abdomen or pelvis. There is no ascites.  Musculoskeletal: No significant abnormality  IMPRESSION: No significant abnormality   Electronically Signed   By: Andreas Newport M.D.   On: 03/01/2015 01:08     EKG Interpretation None      MDM   Final diagnoses:  SOB (shortness of breath)  Abdominal pain in female    9:29 PM Labs pending. Patient sent from urgent care for further evaluation.   Labs unremarkable for acute changes. Patient's CT pending. Patient signed out to [facility], PA-C.    [place], PA-C 03/01/15 [place], MD 03/02/15 [phone]

## 2015-02-28 NOTE — ED Notes (Signed)
Pt. reports generalized weakness , fatigue , nausea , and  poor appetite onset this week , denies fever or chills , respirations unlabored .

## 2015-02-28 NOTE — ED Provider Notes (Addendum)
CSN: [id number]     Arrival date & time 02/28/15  1928 History   First MD Initiated Contact with Patient 02/28/15 2009     Chief Complaint  Patient presents with  . Weakness   (Consider location/radiation/quality/duration/timing/severity/associated sxs/prior Treatment) Patient is a 54 y.o. female presenting with weakness. The history is provided by the patient.  Weakness This is a chronic problem. The current episode started more than 2 days ago. The problem has been gradually worsening. Associated symptoms include abdominal pain and shortness of breath. Associated symptoms comments: Wt loss, h/o divert, smoker, poor appetite..    Past Medical History  Diagnosis Date  . Anxiety   . Depression   . Cervical cancer     Discovered by pap smear, treated by Dr. Charlesetta Garibaldi with LEEP procedure.  Pap since that time has been normal.  . Chronic back pain      her CT of the head without contrast June 28, 2008- moderate to large right paracentral disc protrusion at C4-C5 resulting in a right C5 neuroforaminal stenosis and probably some degree of spinal stenosis. C. bilateral C6 neuroforaminal stenosis related to  degenerative hypertrophy, no acute fracture or loose pieces identifying the cervical spine, ligamentous injury is not excluded.    Marland Kitchen Headache(784.0)   . COPD (chronic obstructive pulmonary disease)   . Arthritis   . GERD (gastroesophageal reflux disease)  October 2011     Williams, esophagogram - tiny amount of gastroesophageal reflux otherwise unremarkable exam, done by Dr. Dellis Filbert  . Osteoporosis     T score L femur neck = -2.6 (02/21/12)  . Fibromyalgia   . Hemorrhoids    Past Surgical History  Procedure Laterality Date  . Cervical cone biopsy      leep procedure  . Multiple extractions with alveoloplasty  06/18/2012    Procedure: MULTIPLE EXTRACION WITH ALVEOLOPLASTY;  Surgeon: Gae Bon, DDS;  Location: [place];  Service: Oral Surgery;  Laterality: Bilateral;   Family  History  Problem Relation Age of Onset  . Stroke Mother   . Diabetes Mother   . Heart disease Father   . Lung cancer Daughter    History  Substance Use Topics  . Smoking status: Current Every Day Smoker -- 1.00 packs/day for 34 years    Types: Cigarettes  . Smokeless tobacco: Never Used     Comment: hx drugs non since 89/ TRYING TO QUITS/ HAS CUT BACK  . Alcohol Use: No   OB History    Gravida Para Term Preterm AB TAB SAB Ectopic Multiple Living   1 1 1       2      Review of Systems  Constitutional: Positive for appetite change, fatigue and unexpected weight change.  HENT: Negative.   Respiratory: Positive for cough and shortness of breath.   Cardiovascular: Negative for leg swelling.  Gastrointestinal: Positive for abdominal pain. Negative for nausea, vomiting and blood in stool.  Neurological: Positive for weakness.    Allergies  Aripiprazole; Chantix; Pregabalin; and Zolpidem tartrate  Home Medications   Prior to Admission medications   Medication Sig Start Date End Date Taking? Authorizing Provider  ALPRAZolam Duanne Moron) 1 MG tablet Take 1 tablet (1 mg total) by mouth 2 (two) times daily as needed for anxiety. Patient taking differently: Take 1 mg by mouth 2 (two) times daily.  02/16/15   Golden Circle, FNP  budesonide-formoterol (SYMBICORT) 160-4.5 MCG/ACT inhaler Inhale 2 puffs into the lungs 2 (two) times daily. 11/18/14   Benjamine Mola  A Kollar, MD  cyanocobalamin (,VITAMIN B-12,) 1000 MCG/ML injection Inject 1,000 mcg into the muscle once.    Historical Provider, MD  dicyclomine (BENTYL) 20 MG tablet Take 1 tablet (20 mg total) by mouth 2 (two) times daily. 03/01/15   Jennifer Piepenbrink, PA-C  fentaNYL (DURAGESIC - DOSED MCG/HR) 100 MCG/HR Place 1 patch (100 mcg total) onto the skin every 3 (three) days. 02/16/15   Golden Circle, FNP  gabapentin (NEURONTIN) 300 MG capsule Take 1 capsule (300 mg total) by mouth 2 (two) times daily. 11/27/14   Olga Millers, MD   NEXIUM 40 MG capsule TAKE 1 CAPSULE (40 MG TOTAL) BY MOUTH DAILY. 11/18/14   Olga Millers, MD  oxycodone (OXY-IR) 5 MG capsule Take 1 capsule (5 mg total) by mouth 2 (two) times daily as needed for pain. 30 day supply 02/16/15   Golden Circle, FNP  polyethylene glycol (MIRALAX / GLYCOLAX) packet Take 17 g by mouth daily as needed for mild constipation.    Historical Provider, MD  tiotropium (SPIRIVA HANDIHALER) 18 MCG inhalation capsule Place 1 capsule (18 mcg total) into inhaler and inhale daily. 11/27/14   Olga Millers, MD   BP 100/70 mmHg  Pulse 112  Temp(Src) 98.6 F (37 C) (Oral)  SpO2 96%  LMP 09/29/2003 Physical Exam  Constitutional: She is oriented to person, place, and time. She appears lethargic. She appears cachectic. She has a sickly appearance. She appears ill.  Cardiovascular: Normal heart sounds.   Pulmonary/Chest: She has decreased breath sounds. She has rhonchi.  Abdominal: She exhibits no distension and no mass. There is tenderness in the right lower quadrant. There is no rebound, no guarding and no CVA tenderness.    Musculoskeletal: She exhibits no edema.  Neurological: She is oriented to person, place, and time. She appears lethargic.  Skin: Skin is warm and dry.  Nursing note and vitals reviewed.   ED Course  Procedures (including critical care time) Labs Review Labs Reviewed - No data to display  Imaging Review Dg Chest 2 View  02/28/2015   CLINICAL DATA:  Shortness of breath.  Nausea.  Anorexia.  COPD.  EXAM: CHEST  2 VIEW  COMPARISON:  05/29/2014  FINDINGS: The heart size and mediastinal contours are within normal limits. Both lungs are clear. Pulmonary hyperinflation again seen, consistent with COPD. No evidence of pneumothorax or pleural effusion.  IMPRESSION: Stable exam.  COPD.  No active disease.   Electronically Signed   By: Earle Gell M.D.   On: 02/28/2015 21:44   Ct Abdomen Pelvis W Contrast  03/01/2015   CLINICAL DATA:  Epigastric  pain, weakness, nausea and vomiting.  EXAM: CT ABDOMEN AND PELVIS WITH CONTRAST  TECHNIQUE: Multidetector CT imaging of the abdomen and pelvis was performed using the standard protocol following bolus administration of intravenous contrast.  CONTRAST:  36mL OMNIPAQUE IOHEXOL 300 MG/ML  SOLN  COMPARISON:  05/27/2014  FINDINGS: Lower chest:  No significant abnormality  Hepatobiliary: There are normal appearances of the liver, gallbladder and bile ducts.  Pancreas: Normal  Spleen: Norm  Adrenals/Urinary Tract: The adrenals and kidneys are normal in appearance. There is no urinary calculus evident. There is no hydronephrosis or ureteral dilatation. Collecting systems and ureters appear unremarkable.  Stomach/Bowel: There are normal appearances of the stomach, small bowel and colon. The appendix is normal.  Vascular/Lymphatic: The abdominal aorta is normal in caliber. There is moderate atherosclerotic calcification. There is no adenopathy in the abdomen or pelvis.  Reproductive:  The uterus and ovaries appear unremarkable.  Other: No acute inflammatory changes are evident in the abdomen or pelvis. There is no ascites.  Musculoskeletal: No significant abnormality  IMPRESSION: No significant abnormality   Electronically Signed   By: Andreas Newport M.D.   On: 03/01/2015 01:08     MDM   1. Weight loss, abnormal        Billy Fischer, MD 02/28/15 2021  Billy Fischer, MD 03/02/15 2132

## 2015-02-28 NOTE — ED Notes (Signed)
Pt  Is  Weakness   Has  A  decresed  Appetite     And  Reports  abd  Pain   She  Reports  A  History  Of  Diverticulitis           she  Reports  She  Saw  Her pcp   sev  Days  Ago  yest  Is  Worse today      She  Has  Poor  Eye  Contact

## 2015-03-01 ENCOUNTER — Encounter (HOSPITAL_COMMUNITY): Payer: Self-pay | Admitting: Radiology

## 2015-03-01 ENCOUNTER — Emergency Department (HOSPITAL_COMMUNITY): Payer: Medicare Other

## 2015-03-01 DIAGNOSIS — J441 Chronic obstructive pulmonary disease with (acute) exacerbation: Secondary | ICD-10-CM | POA: Diagnosis not present

## 2015-03-01 DIAGNOSIS — R112 Nausea with vomiting, unspecified: Secondary | ICD-10-CM | POA: Diagnosis not present

## 2015-03-01 DIAGNOSIS — R1013 Epigastric pain: Secondary | ICD-10-CM | POA: Diagnosis not present

## 2015-03-01 MED ORDER — DICYCLOMINE HCL 20 MG PO TABS: 20.0000 mg | ORAL_TABLET | Freq: Two times a day (BID) | ORAL | Status: DC

## 2015-03-01 MED ORDER — IOHEXOL 300 MG/ML  SOLN
80.0000 mL | Freq: Once | INTRAMUSCULAR | Status: AC | PRN
  Administered 2015-03-01: 80 mL via INTRAVENOUS

## 2015-03-01 NOTE — ED Notes (Addendum)
This RN called CT to determine status of CT transport; was informed pt is a 2-hr drinker due to her weight and patient will be scanned at [facility].

## 2015-03-01 NOTE — ED Notes (Signed)
Pt offered heat packs to place on her back for chronic back pain and patient denied.

## 2015-03-01 NOTE — ED Provider Notes (Signed)
Results for orders placed or performed during the hospital encounter of 02/28/15  CBC with Differential/Platelet  Result Value Ref Range   WBC 5.8 4.0 - 10.5 K/uL   RBC 4.94 3.87 - 5.11 MIL/uL   Hemoglobin 16.3 (H) 12.0 - 15.0 g/dL   HCT 47.3 (H) 36.0 - 46.0 %   MCV 95.7 78.0 - 100.0 fL   MCH 33.0 26.0 - 34.0 pg   MCHC 34.5 30.0 - 36.0 g/dL   RDW 13.8 11.5 - 15.5 %   Platelets 260 150 - 400 K/uL   Neutrophils Relative % 48 43 - 77 %   Neutro Abs 2.7 1.7 - 7.7 K/uL   Lymphocytes Relative 41 12 - 46 %   Lymphs Abs 2.4 0.7 - 4.0 K/uL   Monocytes Relative 9 3 - 12 %   Monocytes Absolute 0.5 0.1 - 1.0 K/uL   Eosinophils Relative 2 0 - 5 %   Eosinophils Absolute 0.1 0.0 - 0.7 K/uL   Basophils Relative 0 0 - 1 %   Basophils Absolute 0.0 0.0 - 0.1 K/uL  Comprehensive metabolic panel  Result Value Ref Range   Sodium 137 135 - 145 mmol/L   Potassium 3.6 3.5 - 5.1 mmol/L   Chloride 98 (L) 101 - 111 mmol/L   CO2 31 22 - 32 mmol/L   Glucose, Bld 101 (H) 65 - 99 mg/dL   BUN 13 6 - 20 mg/dL   Creatinine, Ser 0.75 0.44 - 1.00 mg/dL   Calcium 9.9 8.9 - 10.3 mg/dL   Total Protein 8.3 (H) 6.5 - 8.1 g/dL   Albumin 4.8 3.5 - 5.0 g/dL   AST 20 15 - 41 U/L   ALT 8 (L) 14 - 54 U/L   Alkaline Phosphatase 75 38 - 126 U/L   Total Bilirubin 0.5 0.3 - 1.2 mg/dL   GFR calc non Af Amer >60 >60 mL/min   GFR calc Af Amer >60 >60 mL/min   Anion gap 8 5 - 15  Lipase, blood  Result Value Ref Range   Lipase 15 (L) 22 - 51 U/L  Urinalysis, Routine w reflex microscopic (not at [facility])  Result Value Ref Range   Color, Urine YELLOW YELLOW   APPearance CLOUDY (A) CLEAR   Specific Gravity, Urine 1.022 1.005 - 1.030   pH 6.0 5.0 - 8.0   Glucose, UA NEGATIVE NEGATIVE mg/dL   Hgb urine dipstick NEGATIVE NEGATIVE   Bilirubin Urine NEGATIVE NEGATIVE   Ketones, ur 15 (A) NEGATIVE mg/dL   Protein, ur NEGATIVE NEGATIVE mg/dL   Urobilinogen, UA 1.0 0.0 - 1.0 mg/dL   Nitrite NEGATIVE NEGATIVE   Leukocytes, UA  SMALL (A) NEGATIVE  Urine microscopic-add on  Result Value Ref Range   Squamous Epithelial / LPF RARE RARE   WBC, UA 3-6 <3 WBC/hpf   RBC / HPF 0-2 <3 RBC/hpf   Bacteria, UA RARE RARE   Urine-Other MUCOUS PRESENT   I-stat troponin, ED  Result Value Ref Range   Troponin i, poc 0.00 0.00 - 0.08 ng/mL   Comment 3           Dg Chest 2 View  02/28/2015   CLINICAL DATA:  Shortness of breath.  Nausea.  Anorexia.  COPD.  EXAM: CHEST  2 VIEW  COMPARISON:  05/29/2014  FINDINGS: The heart size and mediastinal contours are within normal limits. Both lungs are clear. Pulmonary hyperinflation again seen, consistent with COPD. No evidence of pneumothorax or pleural effusion.  IMPRESSION: Stable exam.  COPD.  No active disease.   Electronically Signed   By: Earle Gell M.D.   On: 02/28/2015 21:44   Ct Abdomen Pelvis W Contrast  03/01/2015   CLINICAL DATA:  Epigastric pain, weakness, nausea and vomiting.  EXAM: CT ABDOMEN AND PELVIS WITH CONTRAST  TECHNIQUE: Multidetector CT imaging of the abdomen and pelvis was performed using the standard protocol following bolus administration of intravenous contrast.  CONTRAST:  66mL OMNIPAQUE IOHEXOL 300 MG/ML  SOLN  COMPARISON:  05/27/2014  FINDINGS: Lower chest:  No significant abnormality  Hepatobiliary: There are normal appearances of the liver, gallbladder and bile ducts.  Pancreas: Normal  Spleen: Norm  Adrenals/Urinary Tract: The adrenals and kidneys are normal in appearance. There is no urinary calculus evident. There is no hydronephrosis or ureteral dilatation. Collecting systems and ureters appear unremarkable.  Stomach/Bowel: There are normal appearances of the stomach, small bowel and colon. The appendix is normal.  Vascular/Lymphatic: The abdominal aorta is normal in caliber. There is moderate atherosclerotic calcification. There is no adenopathy in the abdomen or pelvis.  Reproductive: The uterus and ovaries appear unremarkable.  Other: No acute inflammatory  changes are evident in the abdomen or pelvis. There is no ascites.  Musculoskeletal: No significant abnormality  IMPRESSION: No significant abnormality   Electronically Signed   By: Andreas Newport M.D.   On: 03/01/2015 01:08    1. Abdominal pain in female   2. SOB (shortness of breath)    Patient care acquired from [facility] PA-C pending CT scan results with plan to d/c home if negative. Discussed CT scan results with patient who is feeling better and agreeable to d/c home. Patient is stable at time of discharge   Baron Sane, PA-C 03/01/15 [place], MD 03/01/15 [phone]

## 2015-03-01 NOTE — Discharge Instructions (Signed)
Please follow up with your primary care physician in 1-2 days. If you do not have one please call the [place] number listed above. Please read all discharge instructions and return precautions.    Abdominal Pain, Women Abdominal (stomach, pelvic, or belly) pain can be caused by many things. It is important to tell your doctor:  The location of the pain.  Does it come and go or is it present all the time?  Are there things that start the pain (eating certain foods, exercise)?  Are there other symptoms associated with the pain (fever, nausea, vomiting, diarrhea)? All of this is helpful to know when trying to find the cause of the pain. CAUSES   Stomach: virus or bacteria infection, or ulcer.  Intestine: appendicitis (inflamed appendix), regional ileitis (Crohn's disease), ulcerative colitis (inflamed colon), irritable bowel syndrome, diverticulitis (inflamed diverticulum of the colon), or cancer of the stomach or intestine.  Gallbladder disease or stones in the gallbladder.  Kidney disease, kidney stones, or infection.  Pancreas infection or cancer.  Fibromyalgia (pain disorder).  Diseases of the female organs:  Uterus: fibroid (non-cancerous) tumors or infection.  Fallopian tubes: infection or tubal pregnancy.  Ovary: cysts or tumors.  Pelvic adhesions (scar tissue).  Endometriosis (uterus lining tissue growing in the pelvis and on the pelvic organs).  Pelvic congestion syndrome (female organs filling up with blood just before the menstrual period).  Pain with the menstrual period.  Pain with ovulation (producing an egg).  Pain with an IUD (intrauterine device, birth control) in the uterus.  Cancer of the female organs.  Functional pain (pain not caused by a disease, may improve without treatment).  Psychological pain.  Depression. DIAGNOSIS  Your doctor will decide the seriousness of your pain by doing an examination.  Blood  tests.  X-rays.  Ultrasound.  CT scan (computed tomography, special type of X-ray).  MRI (magnetic resonance imaging).  Cultures, for infection.  Barium enema (dye inserted in the large intestine, to better view it with X-rays).  Colonoscopy (looking in intestine with a lighted tube).  Laparoscopy (minor surgery, looking in abdomen with a lighted tube).  Major abdominal exploratory surgery (looking in abdomen with a large incision). TREATMENT  The treatment will depend on the cause of the pain.   Many cases can be observed and treated at home.  Over-the-counter medicines recommended by your caregiver.  Prescription medicine.  Antibiotics, for infection.  Birth control pills, for painful periods or for ovulation pain.  Hormone treatment, for endometriosis.  Nerve blocking injections.  Physical therapy.  Antidepressants.  Counseling with a psychologist or psychiatrist.  Minor or major surgery. HOME CARE INSTRUCTIONS   Do not take laxatives, unless directed by your caregiver.  Take over-the-counter pain medicine only if ordered by your caregiver. Do not take aspirin because it can cause an upset stomach or bleeding.  Try a clear liquid diet (broth or water) as ordered by your caregiver. Slowly move to a bland diet, as tolerated, if the pain is related to the stomach or intestine.  Have a thermometer and take your temperature several times a day, and record it.  Bed rest and sleep, if it helps the pain.  Avoid sexual intercourse, if it causes pain.  Avoid stressful situations.  Keep your follow-up appointments and tests, as your caregiver orders.  If the pain does not go away with medicine or surgery, you may try:  Acupuncture.  Relaxation exercises (yoga, meditation).  Group therapy.  Counseling. SEEK  MEDICAL CARE IF:   You notice certain foods cause stomach pain.  Your home care treatment is not helping your pain.  You need stronger pain  medicine.  You want your IUD removed.  You feel faint or lightheaded.  You develop nausea and vomiting.  You develop a rash.  You are having side effects or an allergy to your medicine. SEEK IMMEDIATE MEDICAL CARE IF:   Your pain does not go away or gets worse.  You have a fever.  Your pain is felt only in portions of the abdomen. The right side could possibly be appendicitis. The left lower portion of the abdomen could be colitis or diverticulitis.  You are passing blood in your stools (bright red or black tarry stools, with or without vomiting).  You have blood in your urine.  You develop chills, with or without a fever.  You pass out. MAKE SURE YOU:   Understand these instructions.  Will watch your condition.  Will get help right away if you are not doing well or get worse. Document Released: 05/15/2007 Document Revised: 12/02/2013 Document Reviewed: 06/04/2009 [facility] Patient Information 2015 [place], [place]. This information is not intended to replace advice given to you by your health care provider. Make sure you discuss any questions you have with your health care provider.

## 2015-03-01 NOTE — ED Notes (Signed)
Pt A&OX4, ambulatory with cane with steady gait, NAD. Pt states she has all of her belongings with her at discharge.

## 2015-03-06 ENCOUNTER — Telehealth: Payer: Self-pay | Admitting: Internal Medicine

## 2015-03-06 NOTE — Telephone Encounter (Signed)
Please advise in dr Jeraldine Loots absence, i will call patient back, thanks

## 2015-03-06 NOTE — Telephone Encounter (Signed)
Patient would like someone to call her to discuss her recent lab results. She is concerned about her recent weight loss and possibility of cancer. She is signed up for MyChart, but does not have access to it at this time. CB# [phone]

## 2015-03-07 DIAGNOSIS — J449 Chronic obstructive pulmonary disease, unspecified: Secondary | ICD-10-CM | POA: Diagnosis not present

## 2015-03-09 ENCOUNTER — Ambulatory Visit: Payer: Medicare Other | Admitting: Internal Medicine

## 2015-03-09 ENCOUNTER — Ambulatory Visit (INDEPENDENT_AMBULATORY_CARE_PROVIDER_SITE_OTHER)
Admission: RE | Admit: 2015-03-09 | Discharge: 2015-03-09 | Disposition: A | Discharge status: Home / Self Care | Payer: Medicare Other | Source: Ambulatory Visit | Attending: Internal Medicine | Admitting: Internal Medicine

## 2015-03-09 DIAGNOSIS — R0602 Shortness of breath: Secondary | ICD-10-CM | POA: Diagnosis not present

## 2015-03-09 DIAGNOSIS — J439 Emphysema, unspecified: Secondary | ICD-10-CM | POA: Diagnosis not present

## 2015-03-09 DIAGNOSIS — R109 Unspecified abdominal pain: Secondary | ICD-10-CM

## 2015-03-09 DIAGNOSIS — J449 Chronic obstructive pulmonary disease, unspecified: Secondary | ICD-10-CM | POA: Diagnosis not present

## 2015-03-09 DIAGNOSIS — R634 Abnormal weight loss: Secondary | ICD-10-CM | POA: Diagnosis not present

## 2015-03-09 MED ORDER — IOHEXOL 300 MG/ML  SOLN
80.0000 mL | Freq: Once | INTRAMUSCULAR | Status: AC | PRN
  Administered 2015-03-09: 80 mL via INTRAVENOUS

## 2015-03-10 NOTE — Telephone Encounter (Signed)
Please inform patient that review of her blood work shows there is nothing glaring that would indicate that she has cancer. Her weight loss is most likely related to her COPD. Therefore please have her eat frequent, small meals and add Boost or Ensure as needed between meals to help with nutrition. We can also refer her to nutrition education if necessary.

## 2015-03-11 ENCOUNTER — Telehealth: Payer: Self-pay | Admitting: Internal Medicine

## 2015-03-11 DIAGNOSIS — R0602 Shortness of breath: Secondary | ICD-10-CM

## 2015-03-11 NOTE — Telephone Encounter (Signed)
Patient called wanting her CT results Please call her at [phone]

## 2015-03-11 NOTE — Telephone Encounter (Signed)
Patient states she was told to wear oxygen all the time.  Patient is requesting [facility] to be [facility] to [facility] for assesment to see if she can get a smaller oxygen tank since she will have to carry with her all the time.

## 2015-03-11 NOTE — Telephone Encounter (Signed)
Advised patient of greg calones note---patient has already seen nutritionist and she is trying to drink 2 boosts per day--patient has not been using oxygen because patient stated she didn't really think it was necessary---i advised patient she needed to resume o2 and have her oxygen level rechecked when she comes in for appt on 8/29 with dr Doug Sou

## 2015-03-11 NOTE — Telephone Encounter (Signed)
[name] spoke with patient regarding results.

## 2015-03-13 NOTE — Telephone Encounter (Signed)
Please look at this 

## 2015-03-13 NOTE — Telephone Encounter (Signed)
Placed order for portable o2 tank that is easily transported with [facility] st[facility], waiting to see if this order is correct and for [facility] to respond

## 2015-03-17 ENCOUNTER — Telehealth: Payer: Self-pay | Admitting: *Deleted

## 2015-03-17 DIAGNOSIS — G894 Chronic pain syndrome: Secondary | ICD-10-CM

## 2015-03-17 NOTE — Telephone Encounter (Signed)
Left msg on triage requesting refills on her oxycodone & fentanyl patches. MD out of office is this ok...Johny Chess

## 2015-03-18 NOTE — Telephone Encounter (Signed)
Patient calling back regarding her refills

## 2015-03-18 NOTE — Telephone Encounter (Signed)
Pls ask Dr Doug Sou Thx

## 2015-03-19 MED ORDER — OXYCODONE HCL 5 MG PO CAPS: 5.0000 mg | ORAL_CAPSULE | Freq: Two times a day (BID) | ORAL | Status: DC | PRN

## 2015-03-19 MED ORDER — FENTANYL 100 MCG/HR TD PT72: 100.0000 ug | MEDICATED_PATCH | TRANSDERMAL | Status: DC

## 2015-03-19 NOTE — Telephone Encounter (Signed)
Printed and signed.  

## 2015-03-19 NOTE — Telephone Encounter (Signed)
[name] called pt inform her rx ready for pick-up...[name]

## 2015-03-30 ENCOUNTER — Ambulatory Visit (INDEPENDENT_AMBULATORY_CARE_PROVIDER_SITE_OTHER): Payer: Medicare Other | Admitting: Internal Medicine

## 2015-03-30 ENCOUNTER — Encounter: Payer: Self-pay | Admitting: Internal Medicine

## 2015-03-30 VITALS — BP 98/80 | HR 83 | Temp 98.3°F | Resp 16 | Wt 97.1 lb

## 2015-03-30 DIAGNOSIS — E569 Vitamin deficiency, unspecified: Secondary | ICD-10-CM

## 2015-03-30 DIAGNOSIS — F12980 Cannabis use, unspecified with anxiety disorder: Secondary | ICD-10-CM

## 2015-03-30 DIAGNOSIS — J449 Chronic obstructive pulmonary disease, unspecified: Secondary | ICD-10-CM

## 2015-03-30 DIAGNOSIS — F418 Other specified anxiety disorders: Secondary | ICD-10-CM | POA: Diagnosis not present

## 2015-03-30 DIAGNOSIS — Z720 Tobacco use: Secondary | ICD-10-CM

## 2015-03-30 MED ORDER — ALPRAZOLAM 1 MG PO TABS: 1.0000 mg | ORAL_TABLET | Freq: Three times a day (TID) | ORAL | Status: DC | PRN

## 2015-03-30 NOTE — Assessment & Plan Note (Signed)
She is now using ensure BID for nutrients and calories.

## 2015-03-30 NOTE — Patient Instructions (Signed)
We will increase the xanax so you can get 75 per month. This means that you can take an extra 1/2 pill per day.   Thankfully there was no evidence of cancer in the stomach or the lungs from the imaging tests.   Keep taking the boost everyday to help bring the weight up. You still need more weight to help with your stamina and endurance.

## 2015-03-30 NOTE — Assessment & Plan Note (Signed)
Not wanting to quit due to stress. Reminded her about the risk of progression of her lung disease and cancer. She declines cessation at this time.

## 2015-03-30 NOTE — Assessment & Plan Note (Signed)
Possibly contributing to her weight loss. Still does not wish to try any medication SSRI or related. Will temporarily increase her xanax to #75 per month so she can have an additional 1/2 pill per day. She does not have a large support system but has 1 friend she can open up to about her problems.
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
## 2015-03-30 NOTE — Assessment & Plan Note (Signed)
Once she gets some weight back would benefit from pulmonary rehab but she does not think she can make so many appointments. Using O2 2L now due to previous hypoxia and now 93% on 2 L today. Does get SOB with walking but stable. CT chest without any lung nodules or signs of cancer. She is not ready to quit due to increased stress.

## 2015-03-30 NOTE — Progress Notes (Signed)
Pre visit review using our clinic review tool, if applicable. No additional management support is needed unless otherwise documented below in the visit note. 

## 2015-04-07 DIAGNOSIS — J449 Chronic obstructive pulmonary disease, unspecified: Secondary | ICD-10-CM | POA: Diagnosis not present

## 2015-04-15 ENCOUNTER — Telehealth: Payer: Self-pay | Admitting: *Deleted

## 2015-04-15 ENCOUNTER — Other Ambulatory Visit: Payer: Self-pay | Admitting: Internal Medicine

## 2015-04-15 DIAGNOSIS — G894 Chronic pain syndrome: Secondary | ICD-10-CM

## 2015-04-15 NOTE — Telephone Encounter (Signed)
Left msg on triage requesting monthly refills on her fentanyl patches & oxyocodone...Johny Chess

## 2015-04-16 MED ORDER — OXYCODONE HCL 5 MG PO CAPS: 5.0000 mg | ORAL_CAPSULE | Freq: Two times a day (BID) | ORAL | Status: DC | PRN

## 2015-04-16 MED ORDER — FENTANYL 100 MCG/HR TD PT72: 100.0000 ug | MEDICATED_PATCH | TRANSDERMAL | Status: DC

## 2015-04-16 NOTE — Telephone Encounter (Signed)
Refill request early and she can come in Monday to pickup.

## 2015-04-16 NOTE — Telephone Encounter (Addendum)
Notified pt with md response. Place rx in cabinet for pick-up on Monday...[name]

## 2015-04-16 NOTE — Telephone Encounter (Signed)
pls advise on msg below.../lmb 

## 2015-05-07 DIAGNOSIS — J449 Chronic obstructive pulmonary disease, unspecified: Secondary | ICD-10-CM | POA: Diagnosis not present

## 2015-05-18 ENCOUNTER — Telehealth: Payer: Self-pay | Admitting: *Deleted

## 2015-05-18 ENCOUNTER — Ambulatory Visit: Payer: Self-pay | Admitting: Internal Medicine

## 2015-05-18 DIAGNOSIS — G894 Chronic pain syndrome: Secondary | ICD-10-CM

## 2015-05-18 DIAGNOSIS — Z79899 Other long term (current) drug therapy: Secondary | ICD-10-CM | POA: Diagnosis not present

## 2015-05-18 DIAGNOSIS — Z79891 Long term (current) use of opiate analgesic: Secondary | ICD-10-CM | POA: Diagnosis not present

## 2015-05-18 MED ORDER — FENTANYL 100 MCG/HR TD PT72: 100.0000 ug | MEDICATED_PATCH | TRANSDERMAL | Status: DC

## 2015-05-18 MED ORDER — OXYCODONE HCL 5 MG PO CAPS: 5.0000 mg | ORAL_CAPSULE | Freq: Two times a day (BID) | ORAL | Status: DC | PRN

## 2015-05-18 NOTE — Telephone Encounter (Signed)
Notified pt rx ready for pick-up.../lmb 

## 2015-05-18 NOTE — Telephone Encounter (Signed)
Left msg on triage requesting refills on her oxycodone & fentanyl patches...[name]

## 2015-05-18 NOTE — Telephone Encounter (Signed)
Printed and signed, needs UDS at visit.

## 2015-06-05 ENCOUNTER — Encounter: Payer: Self-pay | Admitting: Internal Medicine

## 2015-06-07 DIAGNOSIS — J449 Chronic obstructive pulmonary disease, unspecified: Secondary | ICD-10-CM | POA: Diagnosis not present

## 2015-06-16 ENCOUNTER — Other Ambulatory Visit: Payer: Self-pay | Admitting: Internal Medicine

## 2015-06-16 ENCOUNTER — Telehealth: Payer: Self-pay | Admitting: Internal Medicine

## 2015-06-16 DIAGNOSIS — G894 Chronic pain syndrome: Secondary | ICD-10-CM

## 2015-06-16 MED ORDER — OXYCODONE HCL 5 MG PO CAPS: 5.0000 mg | ORAL_CAPSULE | Freq: Two times a day (BID) | ORAL | Status: DC | PRN

## 2015-06-16 MED ORDER — FENTANYL 100 MCG/HR TD PT72: 100.0000 ug | MEDICATED_PATCH | TRANSDERMAL | Status: DC

## 2015-06-16 NOTE — Telephone Encounter (Signed)
Patient requesting refills for fentaNYL (DURAGESIC - DOSED MCG/HR) 100 MCG/HR [id number]  And oxycodone (OXY-IR) 5 MG capsule [id number]

## 2015-06-16 NOTE — Telephone Encounter (Signed)
Ok to refill 

## 2015-06-16 NOTE — Telephone Encounter (Signed)
Printed and signed. Needs UDS at pickup.

## 2015-06-17 ENCOUNTER — Other Ambulatory Visit: Payer: Self-pay | Admitting: Internal Medicine

## 2015-06-17 DIAGNOSIS — Z79891 Long term (current) use of opiate analgesic: Secondary | ICD-10-CM | POA: Diagnosis not present

## 2015-06-17 DIAGNOSIS — G894 Chronic pain syndrome: Secondary | ICD-10-CM

## 2015-06-17 DIAGNOSIS — Z79899 Other long term (current) drug therapy: Secondary | ICD-10-CM | POA: Diagnosis not present

## 2015-06-17 MED ORDER — OXYCODONE HCL 5 MG PO CAPS: 5.0000 mg | ORAL_CAPSULE | Freq: Two times a day (BID) | ORAL | Status: DC | PRN

## 2015-06-17 MED ORDER — FENTANYL 100 MCG/HR TD PT72: 100.0000 ug | MEDICATED_PATCH | TRANSDERMAL | Status: DC

## 2015-06-18 ENCOUNTER — Telehealth: Payer: Self-pay | Admitting: Internal Medicine

## 2015-06-18 NOTE — Telephone Encounter (Signed)
Patient requesting a refill for ALPRAZolam Duanne Moron) 1 MG tablet [id number] Pharmacy is [facility] on [address]

## 2015-06-18 NOTE — Telephone Encounter (Signed)
Patient called back regarding encounter below. She is out and is hoping to get a prescription today

## 2015-06-19 NOTE — Telephone Encounter (Signed)
Patient is calling back regarding her prescription for xanax. Amy told me yesterday she talked with patient and was sending this request to another provider. Can you please check on this.

## 2015-06-19 NOTE — Telephone Encounter (Signed)
Spoke to pt. She is rq rf of alprazolam.   Called pharmacy and pt has refills there.   Called pt and informed of the same.   Disregard any previous message. Pt will not need refills until January according to the information from the pharmacy.

## 2015-06-30 ENCOUNTER — Ambulatory Visit (INDEPENDENT_AMBULATORY_CARE_PROVIDER_SITE_OTHER): Payer: Medicare Other | Admitting: Internal Medicine

## 2015-06-30 ENCOUNTER — Encounter (HOSPITAL_COMMUNITY): Payer: Self-pay | Admitting: Emergency Medicine

## 2015-06-30 ENCOUNTER — Emergency Department (HOSPITAL_COMMUNITY)
Admission: EM | Admit: 2015-06-30 | Discharge: 2015-06-30 | Discharge status: Against Medical Advice | Payer: Medicare Other | Attending: Emergency Medicine | Admitting: Emergency Medicine

## 2015-06-30 ENCOUNTER — Encounter: Payer: Self-pay | Admitting: Internal Medicine

## 2015-06-30 VITALS — BP 112/76 | HR 92 | Temp 97.8°F | Resp 12 | Ht 67.0 in | Wt 94.1 lb

## 2015-06-30 DIAGNOSIS — J449 Chronic obstructive pulmonary disease, unspecified: Secondary | ICD-10-CM

## 2015-06-30 DIAGNOSIS — G894 Chronic pain syndrome: Secondary | ICD-10-CM | POA: Diagnosis not present

## 2015-06-30 DIAGNOSIS — R109 Unspecified abdominal pain: Secondary | ICD-10-CM | POA: Insufficient documentation

## 2015-06-30 DIAGNOSIS — E43 Unspecified severe protein-calorie malnutrition: Secondary | ICD-10-CM

## 2015-06-30 DIAGNOSIS — F17210 Nicotine dependence, cigarettes, uncomplicated: Secondary | ICD-10-CM | POA: Diagnosis not present

## 2015-06-30 DIAGNOSIS — R531 Weakness: Secondary | ICD-10-CM | POA: Insufficient documentation

## 2015-06-30 DIAGNOSIS — R630 Anorexia: Secondary | ICD-10-CM | POA: Diagnosis not present

## 2015-06-30 DIAGNOSIS — Z79891 Long term (current) use of opiate analgesic: Secondary | ICD-10-CM | POA: Diagnosis not present

## 2015-06-30 DIAGNOSIS — R404 Transient alteration of awareness: Secondary | ICD-10-CM | POA: Diagnosis not present

## 2015-06-30 DIAGNOSIS — F418 Other specified anxiety disorders: Secondary | ICD-10-CM

## 2015-06-30 DIAGNOSIS — Z79899 Other long term (current) drug therapy: Secondary | ICD-10-CM | POA: Diagnosis not present

## 2015-06-30 NOTE — Assessment & Plan Note (Signed)
Lack of flare today, stable on continuous O2. Sats 90% today. It is unclear if she uses her inhalers regularly.

## 2015-06-30 NOTE — ED Notes (Signed)
No answer x3

## 2015-06-30 NOTE — Patient Instructions (Signed)
WE would like you to go to the ER as you likely need fluids. We would like to work on something to help you eat as you are not going to survive long without eating.

## 2015-06-30 NOTE — Assessment & Plan Note (Signed)
I am still concerned that this could be related to her pain but it is very hard since she is still in such pain. Will ask her to go to the ER for being unable to eat or drink in 3 days and drastically underweight. Would try reglan and zofran to see if this helps with her stomach problems.

## 2015-06-30 NOTE — Progress Notes (Signed)
   Subjective:    Patient ID: [name], female    DOB: Nov 25, 1960, 54 y.o.   MRN: [id number]  HPI The patient is a 54 YO female coming in for abdominal pain and not being able to eat. She has been losing weight since last visit. She is barely having any energy and not able to get around well. Having a lot more stomach pain. Last BM several days ago. Worsening anxiety and feels like she may die. She has not been able to drink water or eat anything for 3 days. She is having pain when she tries to eat. Some nausea all the time. No vomiting. No diarrhea. Constipated.   Review of Systems  Constitutional: Positive for activity change, appetite change, fatigue and unexpected weight change. Negative for fever.  Respiratory: Positive for shortness of breath. Negative for cough, chest tightness and wheezing.        Stable  Cardiovascular: Negative for chest pain, palpitations and leg swelling.  Gastrointestinal: Positive for nausea, abdominal pain and constipation. Negative for vomiting, diarrhea, blood in stool and abdominal distention.       Severe  Musculoskeletal: Positive for myalgias, back pain and arthralgias. Negative for gait problem.  Neurological: Positive for dizziness and light-headedness. Negative for syncope, weakness and headaches.  Psychiatric/Behavioral: Positive for behavioral problems, dysphoric mood, decreased concentration and agitation. Negative for suicidal ideas and self-injury. The patient is nervous/anxious.       Objective:   Physical Exam  Constitutional: She is oriented to person, place, and time.  Very thin and gaunt  HENT:  Head: Normocephalic and atraumatic.  Eyes: EOM are normal.  Neck: Normal range of motion.  Cardiovascular: Normal rate and regular rhythm.   Pulmonary/Chest: Effort normal and breath sounds normal. She has no wheezes. She has no rales.  Abdominal: Soft. Bowel sounds are normal. There is tenderness.  Neurological: She is alert and oriented  to person, place, and time.  Skin: Skin is warm and dry.  Psychiatric:  Very flat affect   Filed Vitals:   06/30/15 1346  BP: 112/76  Pulse: 92  Temp: 97.8 F (36.6 C)  TempSrc: Oral  Resp: 12  Height: 5\' 7"  (1.702 m)  Weight: 94 lb 1.9 oz (42.693 kg)  SpO2: 90%      Assessment & Plan:

## 2015-06-30 NOTE — ED Notes (Signed)
Per EMS: pt c/o generalized weakness and no appetite due to abd pain; pt requesting increase of pain medication

## 2015-06-30 NOTE — Assessment & Plan Note (Signed)
This is severe and worsening. Her not being able to eat is detrimental. Will need to go to ER as signs of dehydration on exam and she is barely able to walk in the office. She is wearing heavy winter clothing and only weighs 94# at visit. She is 62'7'' and this is too small. She has had prior CT abdomen and pelvis and CT chest which were unrevealing.

## 2015-06-30 NOTE — Assessment & Plan Note (Signed)
This is worsening and severe at this time. She does not leave the house much. She declines any SSRIs but was on cymbalta and did well for some time. Now on xanax alone which is not good therapy for her. Lack of big support system.

## 2015-06-30 NOTE — ED Notes (Signed)
Called for room. No answer x 2

## 2015-06-30 NOTE — Progress Notes (Signed)
Pre visit review using our clinic review tool, if applicable. No additional management support is needed unless otherwise documented below in the visit note. 

## 2015-06-30 NOTE — ED Notes (Signed)
Pt called for room. No answer 

## 2015-07-01 ENCOUNTER — Telehealth: Payer: Self-pay | Admitting: Internal Medicine

## 2015-07-01 MED ORDER — METOCLOPRAMIDE HCL 5 MG PO TABS: 5.0000 mg | ORAL_TABLET | Freq: Three times a day (TID) | ORAL | Status: DC | PRN

## 2015-07-01 NOTE — Telephone Encounter (Signed)
Patient called and said she left the ER last night and was not seen. She waited for 3 hours and her back was hurting from sitting in the chair. She asked if she needs to go back to the hospital, or if there is anything you can call in for stomach pain. She still has not eaten.

## 2015-07-01 NOTE — Telephone Encounter (Signed)
Pt calling back needing to talk with you.

## 2015-07-01 NOTE — Telephone Encounter (Signed)
Sent in rx for reglan that she can try for nausea up to 3 times per day. It is likely that the pain medicine is causing the stomach to go slower and if the reglan does not help we may need to try decreasing the pain medicine to help it get going again. If she is not able to eat or drink I would recommend going to the hospital as we recommended yesterday.

## 2015-07-01 NOTE — Telephone Encounter (Signed)
Pt called in and i give pt the message below per Dr Sharlet Salina.  She said ok

## 2015-07-01 NOTE — Telephone Encounter (Signed)
Can you please call pt. She was taken to the hospital yesterday and they put her in waiting room where she waited for over 3 hours. She decided to go home.  She's wanting to know if there can be orders put in for the hospital or anything that can be called in. Please call her at [phone]

## 2015-07-03 ENCOUNTER — Encounter (HOSPITAL_COMMUNITY): Payer: Self-pay

## 2015-07-03 ENCOUNTER — Observation Stay (HOSPITAL_COMMUNITY): Payer: Medicare Other

## 2015-07-03 ENCOUNTER — Telehealth: Payer: Self-pay | Admitting: Internal Medicine

## 2015-07-03 ENCOUNTER — Observation Stay (HOSPITAL_COMMUNITY)
Admission: AD | Admit: 2015-07-03 | Discharge: 2015-07-05 | Discharge status: Against Medical Advice | Payer: Medicare Other | Source: Ambulatory Visit | Attending: Internal Medicine | Admitting: Internal Medicine

## 2015-07-03 ENCOUNTER — Encounter: Payer: Self-pay | Admitting: Internal Medicine

## 2015-07-03 DIAGNOSIS — M549 Dorsalgia, unspecified: Secondary | ICD-10-CM | POA: Insufficient documentation

## 2015-07-03 DIAGNOSIS — R627 Adult failure to thrive: Secondary | ICD-10-CM | POA: Insufficient documentation

## 2015-07-03 DIAGNOSIS — G894 Chronic pain syndrome: Secondary | ICD-10-CM | POA: Insufficient documentation

## 2015-07-03 DIAGNOSIS — M797 Fibromyalgia: Secondary | ICD-10-CM | POA: Insufficient documentation

## 2015-07-03 DIAGNOSIS — R1084 Generalized abdominal pain: Secondary | ICD-10-CM | POA: Diagnosis not present

## 2015-07-03 DIAGNOSIS — M810 Age-related osteoporosis without current pathological fracture: Secondary | ICD-10-CM | POA: Diagnosis not present

## 2015-07-03 DIAGNOSIS — E43 Unspecified severe protein-calorie malnutrition: Secondary | ICD-10-CM | POA: Diagnosis not present

## 2015-07-03 DIAGNOSIS — F418 Other specified anxiety disorders: Secondary | ICD-10-CM | POA: Diagnosis not present

## 2015-07-03 DIAGNOSIS — Z8541 Personal history of malignant neoplasm of cervix uteri: Secondary | ICD-10-CM | POA: Diagnosis not present

## 2015-07-03 DIAGNOSIS — Z681 Body mass index (BMI) 19 or less, adult: Secondary | ICD-10-CM | POA: Insufficient documentation

## 2015-07-03 DIAGNOSIS — R634 Abnormal weight loss: Secondary | ICD-10-CM | POA: Diagnosis present

## 2015-07-03 DIAGNOSIS — R109 Unspecified abdominal pain: Secondary | ICD-10-CM | POA: Diagnosis present

## 2015-07-03 DIAGNOSIS — R1030 Lower abdominal pain, unspecified: Secondary | ICD-10-CM | POA: Diagnosis not present

## 2015-07-03 DIAGNOSIS — R1031 Right lower quadrant pain: Principal | ICD-10-CM | POA: Insufficient documentation

## 2015-07-03 DIAGNOSIS — M1990 Unspecified osteoarthritis, unspecified site: Secondary | ICD-10-CM | POA: Insufficient documentation

## 2015-07-03 DIAGNOSIS — J449 Chronic obstructive pulmonary disease, unspecified: Secondary | ICD-10-CM | POA: Diagnosis not present

## 2015-07-03 DIAGNOSIS — F17210 Nicotine dependence, cigarettes, uncomplicated: Secondary | ICD-10-CM | POA: Diagnosis not present

## 2015-07-03 DIAGNOSIS — Z79899 Other long term (current) drug therapy: Secondary | ICD-10-CM | POA: Diagnosis not present

## 2015-07-03 DIAGNOSIS — Z79891 Long term (current) use of opiate analgesic: Secondary | ICD-10-CM | POA: Insufficient documentation

## 2015-07-03 DIAGNOSIS — K219 Gastro-esophageal reflux disease without esophagitis: Secondary | ICD-10-CM | POA: Diagnosis present

## 2015-07-03 DIAGNOSIS — Z720 Tobacco use: Secondary | ICD-10-CM | POA: Diagnosis present

## 2015-07-03 DIAGNOSIS — R64 Cachexia: Secondary | ICD-10-CM | POA: Diagnosis not present

## 2015-07-03 LAB — CBC WITH DIFFERENTIAL/PLATELET
BASOS PCT: 0 %
Basophils Absolute: 0 10*3/uL (ref 0.0–0.1)
EOS ABS: 0.1 10*3/uL (ref 0.0–0.7)
EOS PCT: 2 %
HCT: 42.4 % (ref 36.0–46.0)
HEMOGLOBIN: 14.1 g/dL (ref 12.0–15.0)
Lymphocytes Relative: 45 %
Lymphs Abs: 2.4 10*3/uL (ref 0.7–4.0)
MCH: 32.2 pg (ref 26.0–34.0)
MCHC: 33.3 g/dL (ref 30.0–36.0)
MCV: 96.8 fL (ref 78.0–100.0)
Monocytes Absolute: 0.3 10*3/uL (ref 0.1–1.0)
Monocytes Relative: 6 %
NEUTROS PCT: 47 %
Neutro Abs: 2.5 10*3/uL (ref 1.7–7.7)
PLATELETS: 210 10*3/uL (ref 150–400)
RBC: 4.38 MIL/uL (ref 3.87–5.11)
RDW: 13.2 % (ref 11.5–15.5)
WBC: 5.3 10*3/uL (ref 4.0–10.5)

## 2015-07-03 LAB — URINE MICROSCOPIC-ADD ON: RBC / HPF: NONE SEEN RBC/hpf (ref 0–5)

## 2015-07-03 LAB — URINALYSIS, ROUTINE W REFLEX MICROSCOPIC
Bilirubin Urine: NEGATIVE
Glucose, UA: NEGATIVE mg/dL
HGB URINE DIPSTICK: NEGATIVE
KETONES UR: NEGATIVE mg/dL
Nitrite: NEGATIVE
PROTEIN: NEGATIVE mg/dL
Specific Gravity, Urine: 1.029 (ref 1.005–1.030)
pH: 6.5 (ref 5.0–8.0)

## 2015-07-03 LAB — COMPREHENSIVE METABOLIC PANEL
ALT: 10 U/L — ABNORMAL LOW (ref 14–54)
ANION GAP: 5 (ref 5–15)
AST: 14 U/L — ABNORMAL LOW (ref 15–41)
Albumin: 4.4 g/dL (ref 3.5–5.0)
Alkaline Phosphatase: 64 U/L (ref 38–126)
BILIRUBIN TOTAL: 0.7 mg/dL (ref 0.3–1.2)
BUN: 12 mg/dL (ref 6–20)
CO2: 34 mmol/L — ABNORMAL HIGH (ref 22–32)
Calcium: 9.7 mg/dL (ref 8.9–10.3)
Chloride: 103 mmol/L (ref 101–111)
Creatinine, Ser: 0.7 mg/dL (ref 0.44–1.00)
Glucose, Bld: 99 mg/dL (ref 65–99)
POTASSIUM: 4.3 mmol/L (ref 3.5–5.1)
Sodium: 142 mmol/L (ref 135–145)
TOTAL PROTEIN: 7.3 g/dL (ref 6.5–8.1)

## 2015-07-03 LAB — PHOSPHORUS: Phosphorus: 4.2 mg/dL (ref 2.5–4.6)

## 2015-07-03 LAB — TSH: TSH: 0.902 u[IU]/mL (ref 0.350–4.500)

## 2015-07-03 LAB — MAGNESIUM: MAGNESIUM: 2.2 mg/dL (ref 1.7–2.4)

## 2015-07-03 MED ORDER — BUDESONIDE-FORMOTEROL FUMARATE 160-4.5 MCG/ACT IN AERO
2.0000 | INHALATION_SPRAY | Freq: Two times a day (BID) | RESPIRATORY_TRACT | Status: DC
  Administered 2015-07-03 – 2015-07-05 (×4): 2 via RESPIRATORY_TRACT
  Filled 2015-07-03: qty 6

## 2015-07-03 MED ORDER — SODIUM CHLORIDE 0.9 % IV BOLUS (SEPSIS)
500.0000 mL | Freq: Once | INTRAVENOUS | Status: AC
  Administered 2015-07-03: 500 mL via INTRAVENOUS

## 2015-07-03 MED ORDER — MORPHINE SULFATE (PF) 2 MG/ML IV SOLN
2.0000 mg | INTRAVENOUS | Status: DC | PRN
  Administered 2015-07-03 – 2015-07-04 (×2): 2 mg via INTRAVENOUS
  Filled 2015-07-03 (×2): qty 1

## 2015-07-03 MED ORDER — OXYCODONE HCL 5 MG PO TABS
5.0000 mg | ORAL_TABLET | ORAL | Status: DC | PRN
  Administered 2015-07-05: 5 mg via ORAL
  Filled 2015-07-03: qty 1

## 2015-07-03 MED ORDER — IOHEXOL 300 MG/ML  SOLN
100.0000 mL | Freq: Once | INTRAMUSCULAR | Status: AC | PRN
  Administered 2015-07-03: 80 mL via INTRAVENOUS

## 2015-07-03 MED ORDER — ACETAMINOPHEN 325 MG PO TABS: 650.0000 mg | ORAL_TABLET | Freq: Four times a day (QID) | ORAL | Status: DC | PRN

## 2015-07-03 MED ORDER — CHLORHEXIDINE GLUCONATE 0.12 % MT SOLN
15.0000 mL | Freq: Two times a day (BID) | OROMUCOSAL | Status: DC
  Administered 2015-07-03 – 2015-07-04 (×3): 15 mL via OROMUCOSAL
  Filled 2015-07-03 (×5): qty 15

## 2015-07-03 MED ORDER — IOHEXOL 300 MG/ML  SOLN
25.0000 mL | INTRAMUSCULAR | Status: AC
  Administered 2015-07-03 (×2): 25 mL via INTRAVENOUS

## 2015-07-03 MED ORDER — ENOXAPARIN SODIUM 30 MG/0.3ML ~~LOC~~ SOLN
30.0000 mg | SUBCUTANEOUS | Status: DC
  Administered 2015-07-03 – 2015-07-04 (×2): 30 mg via SUBCUTANEOUS
  Filled 2015-07-03 (×3): qty 0.3

## 2015-07-03 MED ORDER — GABAPENTIN 300 MG PO CAPS
300.0000 mg | ORAL_CAPSULE | Freq: Two times a day (BID) | ORAL | Status: DC
  Administered 2015-07-03 – 2015-07-04 (×2): 300 mg via ORAL
  Filled 2015-07-03 (×5): qty 1

## 2015-07-03 MED ORDER — BOOST / RESOURCE BREEZE PO LIQD
237.0000 mL | Freq: Two times a day (BID) | ORAL | Status: DC
  Administered 2015-07-03 – 2015-07-04 (×2): 1 via ORAL

## 2015-07-03 MED ORDER — TIOTROPIUM BROMIDE MONOHYDRATE 18 MCG IN CAPS
18.0000 ug | ORAL_CAPSULE | Freq: Every day | RESPIRATORY_TRACT | Status: DC
  Administered 2015-07-04 – 2015-07-05 (×2): 18 ug via RESPIRATORY_TRACT
  Filled 2015-07-03: qty 5

## 2015-07-03 MED ORDER — ONDANSETRON HCL 4 MG/2ML IJ SOLN
4.0000 mg | Freq: Four times a day (QID) | INTRAMUSCULAR | Status: DC | PRN
  Administered 2015-07-03: 4 mg via INTRAVENOUS
  Filled 2015-07-03: qty 2

## 2015-07-03 MED ORDER — DEXTROSE-NACL 5-0.9 % IV SOLN
INTRAVENOUS | Status: DC
  Administered 2015-07-03 – 2015-07-05 (×5): via INTRAVENOUS

## 2015-07-03 MED ORDER — ALPRAZOLAM 1 MG PO TABS
1.0000 mg | ORAL_TABLET | Freq: Three times a day (TID) | ORAL | Status: DC | PRN
  Administered 2015-07-03 – 2015-07-05 (×4): 1 mg via ORAL
  Filled 2015-07-03 (×4): qty 1

## 2015-07-03 MED ORDER — FENTANYL 100 MCG/HR TD PT72
100.0000 ug | MEDICATED_PATCH | TRANSDERMAL | Status: DC
  Administered 2015-07-05: 100 ug via TRANSDERMAL
  Filled 2015-07-03: qty 1

## 2015-07-03 MED ORDER — POLYETHYLENE GLYCOL 3350 17 G PO PACK
17.0000 g | PACK | Freq: Every day | ORAL | Status: DC
  Administered 2015-07-05: 17 g via ORAL
  Filled 2015-07-03 (×3): qty 1

## 2015-07-03 MED ORDER — NICOTINE 21 MG/24HR TD PT24
21.0000 mg | MEDICATED_PATCH | Freq: Every day | TRANSDERMAL | Status: DC
  Administered 2015-07-03 – 2015-07-05 (×3): 21 mg via TRANSDERMAL
  Filled 2015-07-03 (×3): qty 1

## 2015-07-03 MED ORDER — ACETAMINOPHEN 650 MG RE SUPP
650.0000 mg | Freq: Four times a day (QID) | RECTAL | Status: DC | PRN
Start: 2015-07-03 — End: 2015-07-05

## 2015-07-03 MED ORDER — PANTOPRAZOLE SODIUM 40 MG IV SOLR
40.0000 mg | INTRAVENOUS | Status: DC
  Administered 2015-07-03 – 2015-07-04 (×2): 40 mg via INTRAVENOUS
  Filled 2015-07-03 (×3): qty 40

## 2015-07-03 MED ORDER — ONDANSETRON HCL 4 MG PO TABS: 4.0000 mg | ORAL_TABLET | Freq: Four times a day (QID) | ORAL | Status: DC | PRN

## 2015-07-03 MED ORDER — BISACODYL 5 MG PO TBEC: 5.0000 mg | DELAYED_RELEASE_TABLET | Freq: Every day | ORAL | Status: DC | PRN
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
## 2015-07-03 NOTE — H&P (Signed)
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 Her abdominal pain appears to be dating back to July this year and so was seen in the ED had a normal abdominal CT. She also has unexplained weight loss for past 6 months. (On reviewing the chart weight was 102 pounds in March and is 94 lb on admission) . Patient reports poor by mouth intake with dysphagia but without any nausea or vomiting. Was having to take stool softeners for regular bowel movement. She has difficulty gaining weight despite having nutrition supplement but at the same time has extremely poor by mouth intake. Patient denies headache, dizziness, fever, chills, nausea , vomiting, chest pain, palpitations, SOB,  bowel or urinary symptoms. Denies vaginal discharge or bleeding. Denies hematemesis or melena. She was sent to the ED by her PCP  yesterday after she visited for ongoing abdominal pain and significant weight loss. She was in the waiting room for over 4 hours and her pain got really worse to the point that she had to call her daughter to take her  home so that she could have her pain medications.  Of note patient EGD and colonoscopy in 2009-2010 for weight loss which was unremarkable. Patient has been requiring a lot of pain medications at home and has severe anxiety and depression. She is known to Dr. Hilma Favors in the past when she was her PCP and she was contacted by the patient given her severe symptoms. Requested hospital  admission for her ongoing abdominal pain.  Review of Systems:  Constitutional: Denies fever, chills, diaphoresis, appetite change and fatigue.  HEENT: difficulty swallowing, Denies headache, congestion, cough, ear pain or discharge, neck pain or stiffness   Respiratory: Denies SOB, DOE, cough, chest tightness,  and wheezing.   Cardiovascular: Denies chest pain, palpitations and leg swelling.  Gastrointestinal: abdominal pain, constipation,  Denies nausea, vomiting,  diarrhea, constipation, blood in stool and abdominal distention.  Genitourinary: Denies dysuria, urgency, frequency, hematuria, flank pain and difficulty urinating.  Endocrine: Denies: hot or cold intolerance, sweats,  polyuria, polydipsia. Musculoskeletal: Back pain, malaise, Denies myalgias,  joint swelling, arthralgias and gait problem.  Skin: Denies pallor, rash and wound.  Neurological: Weakness, Denies dizziness, seizures, syncope,  light-headedness, numbness and headaches.  Hematological: Denies adenopathy.  Psychiatric/Behavioral: Denies confusion   Past Medical History  Diagnosis Date  . Anxiety   . Depression   . Cervical cancer ([place])     Discovered by pap smear, treated by Dr. Charlesetta Garibaldi with LEEP procedure.  Pap since that time has been normal.  . Chronic back pain      her CT of the head without contrast June 28, 2008- moderate to large right paracentral disc protrusion at C4-C5 resulting in a right C5 neuroforaminal stenosis and probably some degree of spinal stenosis. C. bilateral C6 neuroforaminal stenosis related to  degenerative hypertrophy, no acute fracture or loose pieces identifying the cervical spine, ligamentous injury is not excluded.    Marland Kitchen Headache(784.0)   . COPD (chronic obstructive pulmonary disease) ([place])   . Arthritis   .  GERD (gastroesophageal reflux disease)  October 2011     [place], esophagogram - tiny amount of gastroesophageal reflux otherwise unremarkable exam, done by Dr. Dellis Filbert  .  Osteoporosis     T score L femur neck = -2.6 (02/21/12)  . Fibromyalgia   . Hemorrhoids    Past Surgical History  Procedure Laterality Date  . Cervical cone biopsy      leep procedure  . Multiple extractions with alveoloplasty  06/18/2012    Procedure: MULTIPLE EXTRACION WITH ALVEOLOPLASTY;  Surgeon: Gae Bon, DDS;  Location: [facility];  Service: Oral Surgery;  Laterality: Bilateral;   Social History:  reports that she has been smoking Cigarettes.  She has a 34 pack-year smoking history. She has never used smokeless tobacco. She reports that she does not drink alcohol or use illicit drugs.  Allergies  Allergen Reactions  . Aripiprazole Other (See Comments)    Suicidal ideation, also with SSRI  . Chantix [Varenicline] Other (See Comments)    Irritability and depressed mood  . Pregabalin Hives    On trunk of body  . Zolpidem Tartrate Other (See Comments)    Unknown- pt states she does not know about this allergy     Family History  Problem Relation Age of Onset  . Stroke Mother   . Diabetes Mother   . Heart disease Father   . Lung cancer Daughter     Prior to Admission medications   Medication Sig Start Date End Date Taking? Authorizing Provider  ALPRAZolam Duanne Moron) 1 MG tablet Take 1 tablet (1 mg total) by mouth 3 (three) times daily as needed for anxiety. 03/30/15  Yes Hoyt Koch, MD  bisacodyl (DULCOLAX) 5 MG EC tablet Take 5 mg by mouth daily as needed for mild constipation or moderate constipation.   Yes Historical Provider, MD  budesonide-formoterol (SYMBICORT) 160-4.5 MCG/ACT inhaler Inhale 2 puffs into the lungs 2 (two) times daily. 11/18/14  Yes Hoyt Koch, MD  cyanocobalamin (,VITAMIN B-12,) 1000 MCG/ML injection Inject 1,000 mcg into the muscle once.   Yes Historical Provider, MD  esomeprazole (NEXIUM) 40 MG capsule TAKE 1 CAPSULE (40 MG TOTAL) BY MOUTH DAILY. 04/15/15  Yes Hoyt Koch, MD  fentaNYL (DURAGESIC - DOSED MCG/HR) 100 MCG/HR  Place 1 patch (100 mcg total) onto the skin every 3 (three) days. 06/17/15  Yes Hoyt Koch, MD  gabapentin (NEURONTIN) 300 MG capsule TAKE 1 CAPSULE (300 MG TOTAL) BY MOUTH 2 (TWO) TIMES DAILY. Patient taking differently: TAKE 1 CAPSULE (300 MG TOTAL) BY MOUTH NIGHTLY. 04/15/15  Yes Hoyt Koch, MD  lactose free nutrition (BOOST) LIQD Take 237 mLs by mouth 2 (two) times daily. Vanilla   Yes Historical Provider, MD  metoCLOPramide (REGLAN) 5 MG tablet Take 1 tablet (5 mg total) by mouth every 8 (eight) hours as needed for nausea. 07/01/15  Yes Hoyt Koch, MD  oxycodone (OXY-IR) 5 MG capsule Take 1 capsule (5 mg total) by mouth 2 (two) times daily as needed for pain. 30 day supply 06/17/15  Yes Hoyt Koch, MD  polyethylene glycol powder (GLYCOLAX/MIRALAX) powder TAKE ONE CAP FULL DAILY 06/17/15  Yes Hoyt Koch, MD  tiotropium (SPIRIVA HANDIHALER) 18 MCG inhalation capsule Place 1 capsule (18 mcg total) into inhaler and inhale daily. 11/27/14  Yes Hoyt Koch, MD  dicyclomine (BENTYL) 20 MG tablet Take 1 tablet (20 mg total) by mouth 2 (two) times daily. Patient not taking: Reported on 06/30/2015 03/01/15   Anderson Malta  P[place]pen[place]ink, PA-C     Physical Exam: Temperature : 97.9 F Pulse 63/m regular Respiratory rate 16/m BP 101/69 mmHg O2 sat 100% on room air  Constitutional: Vital signs reviewed. Middle aged cachectic female lying in bed not in distress, appears fatigued (looks much older than her stated days) HEENT: no pallor, no icterus, dry oral mucosa, no cervical lymphadenopathy, prominent thyroid without tenderness or nodule Cardiovascular: RRR, S1 normal, S2 normal, no MRG Chest: CTAB, no wheezes, rales, or rhonchi Abdominal: Soft. Non-tender, bowel sounds present, tender to palpation over right lower quadrant, no guarding or rigidity GU: No CVA tenderness Ext: warm, no edema Neurological: A&O x3, non focal  Labs on Admission:   Pending Radiological Exams on Admission: No results found.  EKG: NONE  Assessment/Plan  Principal Problem:   Abdominal pain Right progressive abdominal pain. Symptoms ongoing for at least 4 months and progressive. Associated with progressive weight loss and decreased by mouth intake with dysphagia. -Admit under observation. -Pain control with when necessary oxycodone and IV morphine. Supportive care with IV hydration (D5 with normal saline) and antiemetics. -Check CBC, CMET, lipase, magnesium, phosphorus, TSH, hepatitis panel and HIV antibody. Check CA 19-9 and CEA. -check CT of her abdomen and pelvis. -Patient had unremarkable EGD and colonoscopy in 2009-2010 by [facility]. Depending upon her CT results please consult GI in the morning for further evaluation. -Nutrition consultation. -Dr. Hilma Favors aware of patient's admission. Please consult her for any assistance on pain management.   Active Problems:   Depression with anxiety Resume home medications.    COPD, severe ([place]) Resume home inhaler. Counseled strongly on smoking cessation. Order nicotine patch.    GERD Continue IV PPI    Chronic pain syndrome Chronic back pain secondary to traumatic cervical injury. On oxycodone when necessary and Neurontin which is continued. (Increase frequency of oxycodone). Added bowel regimen.   Severe malnutrition Workup in progress. Nutrition consult.    Diet:NPO  DVT prophylaxis: sq lovenox   Code Status: full code Family Communication: None at bedside Disposition Plan: Currently inpatient.  Louellen Molder [facility] Pager [phone]  Total time spent on admission :50 minutes  If 7PM-7AM, please contact night-coverage [email or web address] Password [email or web address] 07/03/2015, 4:31 PM

## 2015-07-03 NOTE — Progress Notes (Signed)
Patient ID: [name], female   DOB: [birth year], 54 y.o.   MRN: [id number]  Request for admission for pain control. Dr. Hilma Favors asked to admit to medical unit. Pt with failure to thrive. VSS. Dr. Hilma Favors can assist with pain management if needed.   Faye Ramsay, MD  [facility] Pager [phone]  If 7PM-7AM, please contact night-coverage [email or web address] Password [email or web address]

## 2015-07-04 DIAGNOSIS — Z8541 Personal history of malignant neoplasm of cervix uteri: Secondary | ICD-10-CM | POA: Diagnosis not present

## 2015-07-04 DIAGNOSIS — R64 Cachexia: Secondary | ICD-10-CM | POA: Diagnosis not present

## 2015-07-04 DIAGNOSIS — Z79899 Other long term (current) drug therapy: Secondary | ICD-10-CM | POA: Diagnosis not present

## 2015-07-04 DIAGNOSIS — F17210 Nicotine dependence, cigarettes, uncomplicated: Secondary | ICD-10-CM | POA: Diagnosis not present

## 2015-07-04 DIAGNOSIS — J449 Chronic obstructive pulmonary disease, unspecified: Secondary | ICD-10-CM | POA: Diagnosis not present

## 2015-07-04 DIAGNOSIS — M549 Dorsalgia, unspecified: Secondary | ICD-10-CM | POA: Diagnosis not present

## 2015-07-04 DIAGNOSIS — R109 Unspecified abdominal pain: Secondary | ICD-10-CM | POA: Diagnosis not present

## 2015-07-04 DIAGNOSIS — R1030 Lower abdominal pain, unspecified: Secondary | ICD-10-CM | POA: Diagnosis not present

## 2015-07-04 DIAGNOSIS — F418 Other specified anxiety disorders: Secondary | ICD-10-CM | POA: Diagnosis not present

## 2015-07-04 DIAGNOSIS — Z79891 Long term (current) use of opiate analgesic: Secondary | ICD-10-CM | POA: Diagnosis not present

## 2015-07-04 DIAGNOSIS — M797 Fibromyalgia: Secondary | ICD-10-CM | POA: Diagnosis not present

## 2015-07-04 DIAGNOSIS — R634 Abnormal weight loss: Secondary | ICD-10-CM | POA: Diagnosis not present

## 2015-07-04 DIAGNOSIS — R627 Adult failure to thrive: Secondary | ICD-10-CM | POA: Diagnosis not present

## 2015-07-04 DIAGNOSIS — R1031 Right lower quadrant pain: Secondary | ICD-10-CM | POA: Diagnosis not present

## 2015-07-04 DIAGNOSIS — G894 Chronic pain syndrome: Secondary | ICD-10-CM | POA: Diagnosis not present

## 2015-07-04 DIAGNOSIS — E43 Unspecified severe protein-calorie malnutrition: Secondary | ICD-10-CM | POA: Diagnosis not present

## 2015-07-04 DIAGNOSIS — R1084 Generalized abdominal pain: Secondary | ICD-10-CM

## 2015-07-04 DIAGNOSIS — K219 Gastro-esophageal reflux disease without esophagitis: Secondary | ICD-10-CM | POA: Diagnosis not present

## 2015-07-04 DIAGNOSIS — Z681 Body mass index (BMI) 19 or less, adult: Secondary | ICD-10-CM | POA: Diagnosis not present

## 2015-07-04 DIAGNOSIS — M810 Age-related osteoporosis without current pathological fracture: Secondary | ICD-10-CM | POA: Diagnosis not present

## 2015-07-04 DIAGNOSIS — M1990 Unspecified osteoarthritis, unspecified site: Secondary | ICD-10-CM | POA: Diagnosis not present

## 2015-07-04 LAB — HEPATITIS PANEL, ACUTE
Hep A IgM: NEGATIVE
Hep B C IgM: NEGATIVE
Hepatitis B Surface Ag: NEGATIVE

## 2015-07-04 LAB — CEA: CEA: 3.4 ng/mL (ref 0.0–4.7)

## 2015-07-04 LAB — CANCER ANTIGEN 19-9: CA 19 9: 15 U/mL (ref 0–35)

## 2015-07-04 LAB — VITAMIN B12: VITAMIN B 12: 174 pg/mL — AB (ref 180–914)

## 2015-07-04 LAB — LIPASE, BLOOD: Lipase: 21 U/L (ref 11–51)

## 2015-07-04 LAB — HIV ANTIBODY (ROUTINE TESTING W REFLEX): HIV SCREEN 4TH GENERATION: NONREACTIVE

## 2015-07-04 MED ORDER — KETOROLAC TROMETHAMINE 15 MG/ML IJ SOLN
15.0000 mg | Freq: Four times a day (QID) | INTRAMUSCULAR | Status: DC | PRN
  Administered 2015-07-04: 15 mg via INTRAVENOUS
  Filled 2015-07-04: qty 1

## 2015-07-04 MED ORDER — SODIUM CHLORIDE 0.9 % IV BOLUS (SEPSIS): 1000.0000 mL | INTRAVENOUS | Status: DC | PRN

## 2015-07-04 MED ORDER — SODIUM CHLORIDE 0.9 % IV BOLUS (SEPSIS)
1000.0000 mL | Freq: Once | INTRAVENOUS | Status: AC
  Administered 2015-07-04: 1000 mL via INTRAVENOUS

## 2015-07-04 MED ORDER — SODIUM CHLORIDE 0.9 % IV BOLUS (SEPSIS)
500.0000 mL | Freq: Once | INTRAVENOUS | Status: AC
  Administered 2015-07-04: 500 mL via INTRAVENOUS

## 2015-07-04 NOTE — Consult Note (Signed)
Referring Provider: Dr. Clementeen Graham Primary Care Physician:  Hoyt Koch, MD Primary Gastroenterologist:  Dr. Henrene Pastor  Reason for Consultation:  Weight loss and abdominal pain; FTT  HPI: [name] is a 54 y.o. 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 Her abdominal pain appears to be dating back to July this year and so was seen in the ED had a normal abdominal CT at that time. She also has unexplained weight loss for past 6 months. (on reviewing the chart weight was 102 pounds in March and is 94 lb on admission). Says that pain has been more prominent over the past 2 weeks.  Patient reports poor by mouth intake without any nausea, vomiting, or dysphagia. Was having to take laxatives or stool softeners to have a BM but does not eat much to put much out.  She was sent to the ED by her PCPyesterday after she visited for ongoing abdominal pain and significant weight loss. She was in the waiting room for over 4 hours and her pain got really worse to the point that she had to call her daughter to take her home so that she could have her pain medications.   Patient has been requiring a lot of pain medications at home and has severe anxiety and depression.  She is known to Dr. Hilma Favors in the past when she was her PCP and she was contacted by the patient given her severe symptoms. Requested hospital admission for her ongoing abdominal pain.  Multiple labs are normal/negative including CBC, CMP, TSH, lipase, CA19-9, CEA, HIV, viral hepatitis studies.  CT abdomen and pelvis with contrast was negative.  She tells me that her pain is much better today.  Pain was in RLQ.  Mostly complaining of back pain and  poor appetite now, but says that she feels hungry now and that she ate most of a sandwich for dinner last night.  Says that she has been taking some Advil once or twice a day but only for the past week or so.  Is worried about her dog and is asking if she will be able to go home soon because she does not have anyone to take care of him.  Endoscopic history as follows:  Colonoscopy by Dr. Henrene Pastor 08/2007 for constipation and abdominal pain/bloating was incomplete prep (only proceeded to transverse colon), but only internal hemorrhoids found.  EGD 12/2007 with Dr. Carlean Purl for abdominal pain showed mild duodenitis  EGD by Dr. Henrene Pastor 12/2008 for dysphagia and weight loss was normal.   Past Medical History  Diagnosis Date  . Anxiety   . Depression   . Cervical cancer ([place])     Discovered by pap smear, treated by Dr. Charlesetta Garibaldi with LEEP procedure.  Pap since that time has been normal.  . Chronic back pain      her CT of the head without contrast June 28, 2008- moderate to large right paracentral disc protrusion at C4-C5 resulting in a right C5 neuroforaminal stenosis and probably some degree of spinal stenosis. C. bilateral C6 neuroforaminal stenosis related to  degenerative hypertrophy, no acute fracture or loose pieces identifying the cervical spine, ligamentous injury is not excluded.    Marland Kitchen Headache(784.0)   . COPD (chronic obstructive pulmonary disease) ([place])   .  Arthritis   . GERD (gastroesophageal reflux disease)  October 2011     Williams, esophagogram - tiny amount of gastroesophageal reflux otherwise unremarkable exam, done by Dr. Dellis Filbert  . Osteoporosis     T score L femur neck = -2.6 (02/21/12)  . Fibromyalgia   . Hemorrhoids     Past Surgical History  Procedure Laterality Date  . Cervical cone biopsy      leep procedure  . Multiple extractions with alveoloplasty  06/18/2012    Procedure: MULTIPLE EXTRACION WITH ALVEOLOPLASTY;  Surgeon: Gae Bon, DDS;  Location: [place] [facility];   Service: Oral Surgery;  Laterality: Bilateral;    Prior to Admission medications   Medication Sig Start Date End Date Taking? Authorizing Provider  ALPRAZolam Duanne Moron) 1 MG tablet Take 1 tablet (1 mg total) by mouth 3 (three) times daily as needed for anxiety. 03/30/15  Yes Hoyt Koch, MD  bisacodyl (DULCOLAX) 5 MG EC tablet Take 5 mg by mouth daily as needed for mild constipation or moderate constipation.   Yes Historical Provider, MD  budesonide-formoterol (SYMBICORT) 160-4.5 MCG/ACT inhaler Inhale 2 puffs into the lungs 2 (two) times daily. 11/18/14  Yes Hoyt Koch, MD  cyanocobalamin (,VITAMIN B-12,) 1000 MCG/ML injection Inject 1,000 mcg into the muscle once.   Yes Historical Provider, MD  esomeprazole (NEXIUM) 40 MG capsule TAKE 1 CAPSULE (40 MG TOTAL) BY MOUTH DAILY. 04/15/15  Yes Hoyt Koch, MD  fentaNYL (DURAGESIC - DOSED MCG/HR) 100 MCG/HR Place 1 patch (100 mcg total) onto the skin every 3 (three) days. 06/17/15  Yes Hoyt Koch, MD  gabapentin (NEURONTIN) 300 MG capsule TAKE 1 CAPSULE (300 MG TOTAL) BY MOUTH 2 (TWO) TIMES DAILY. Patient taking differently: TAKE 1 CAPSULE (300 MG TOTAL) BY MOUTH NIGHTLY. 04/15/15  Yes Hoyt Koch, MD  lactose free nutrition (BOOST) LIQD Take 237 mLs by mouth 2 (two) times daily. Vanilla   Yes Historical Provider, MD  metoCLOPramide (REGLAN) 5 MG tablet Take 1 tablet (5 mg total) by mouth every 8 (eight) hours as needed for nausea. 07/01/15  Yes Hoyt Koch, MD  oxycodone (OXY-IR) 5 MG capsule Take 1 capsule (5 mg total) by mouth 2 (two) times daily as needed for pain. 30 day supply 06/17/15  Yes Hoyt Koch, MD  polyethylene glycol powder (GLYCOLAX/MIRALAX) powder TAKE ONE CAP FULL DAILY 06/17/15  Yes Hoyt Koch, MD  tiotropium (SPIRIVA HANDIHALER) 18 MCG inhalation capsule Place 1 capsule (18 mcg total) into inhaler and inhale daily. 11/27/14  Yes Hoyt Koch, MD  dicyclomine  (BENTYL) 20 MG tablet Take 1 tablet (20 mg total) by mouth 2 (two) times daily. Patient not taking: Reported on 06/30/2015 03/01/15   Baron Sane, PA-C    Current Facility-Administered Medications  Medication Dose Route Frequency Provider Last Rate Last Dose  . acetaminophen (TYLENOL) tablet 650 mg  650 mg Oral Q6H PRN Nishant Dhungel, MD       Or  . acetaminophen (TYLENOL) suppository 650 mg  650 mg Rectal Q6H PRN Nishant Dhungel, MD      . ALPRAZolam Duanne Moron) tablet 1 mg  1 mg Oral TID PRN Nishant Dhungel, MD   1 mg at 07/03/15 1707  . bisacodyl (DULCOLAX) EC tablet 5 mg  5 mg Oral Daily PRN Nishant Dhungel, MD      . budesonide-formoterol (SYMBICORT) 160-4.5 MCG/ACT inhaler 2 puff  2 puff Inhalation BID Nishant Dhungel, MD   2 puff at 07/04/15 0835  .  chlorhexidine (PERIDEX) 0.12 % solution 15 mL  15 mL Mouth/Throat BID Nishant Dhungel, MD   15 mL at 07/03/15 2122  . dextrose 5 %-0.9 % sodium chloride infusion   Intravenous Continuous Nishant Dhungel, MD 100 mL/hr at 07/03/15 2129    . enoxaparin (LOVENOX) injection 30 mg  30 mg Subcutaneous Q24H Nishant Dhungel, MD   30 mg at 07/03/15 2122  . feeding supplement (BOOST / RESOURCE BREEZE) liquid 1 Container  237 mL Oral BID Louellen Molder, MD   1 Container at 07/03/15 2122  . [START ON 07/05/2015] fentaNYL (DURAGESIC - dosed mcg/hr) 100 mcg  100 mcg Transdermal Q72H Nishant Dhungel, MD      . gabapentin (NEURONTIN) capsule 300 mg  300 mg Oral BID Nishant Dhungel, MD   300 mg at 07/03/15 2122  . morphine 2 MG/ML injection 2 mg  2 mg Intravenous Q3H PRN Nishant Dhungel, MD   2 mg at 07/03/15 1707  . nicotine (NICODERM CQ - dosed in mg/24 hours) patch 21 mg  21 mg Transdermal Daily Nishant Dhungel, MD   21 mg at 07/03/15 1848  . ondansetron (ZOFRAN) tablet 4 mg  4 mg Oral Q6H PRN Nishant Dhungel, MD       Or  . ondansetron (ZOFRAN) injection 4 mg  4 mg Intravenous Q6H PRN Nishant Dhungel, MD   4 mg at 07/03/15 1707  . oxyCODONE (Oxy  IR/ROXICODONE) immediate release tablet 5 mg  5 mg Oral Q4H PRN Nishant Dhungel, MD      . pantoprazole (PROTONIX) injection 40 mg  40 mg Intravenous Q24H Nishant Dhungel, MD   40 mg at 07/03/15 1848  . polyethylene glycol (MIRALAX / GLYCOLAX) packet 17 g  17 g Oral Daily Nishant Dhungel, MD   17 g at 07/03/15 1715  . tiotropium (SPIRIVA) inhalation capsule 18 mcg  18 mcg Inhalation Daily Nishant Dhungel, MD   18 mcg at 07/04/15 0835    Allergies as of 07/03/2015 - Review Complete 07/03/2015  Allergen Reaction Noted  . Aripiprazole Other (See Comments) 03/15/2011  . Chantix [varenicline] Other (See Comments) 10/17/2012  . Pregabalin Hives   . Zolpidem tartrate Other (See Comments)     Family History  Problem Relation Age of Onset  . Stroke Mother   . Diabetes Mother   . Heart disease Father   . Lung cancer Daughter     Social History   Social History  . Marital Status: Divorced    Spouse Name: N/A  . Number of Children: N/A  . Years of Education: N/A   Occupational History  . Not on file.   Social History Main Topics  . Smoking status: Current Every Day Smoker -- 1.00 packs/day for 34 years    Types: Cigarettes  . Smokeless tobacco: Never Used     Comment: hx drugs non since 95/ TRYING TO QUITS/ HAS CUT BACK  . Alcohol Use: No  . Drug Use: No  . Sexual Activity: Not Currently    Birth Control/ Protection: None   Other Topics Concern  . Not on file   Social History Narrative   10th grade education. Married then divorced mid 107's. One daughter. Crack cocaine about 1995 ish. One Mollie [place] admit 2001 for SI. Deemed disabled 04/01/2004 By [facility] due to documented health and mental issues.    Review of Systems: Ten point ROS is O/W negative except as mentioned in HPI.  Physical Exam: Vital signs in last 24 hours: Temp:  [97.5 F (36.4  C)-98.1 F (36.7 C)] 97.5 F (36.4 C) (12/03 0557) Pulse Rate:  [52-63] 52 (12/03 0557) Resp:  [16] 16 (12/03 0557) BP:  (86-101)/(44-69) 88/44 mmHg (12/03 0557) SpO2:  [98 %-100 %] 99 % (12/03 0835) Weight:  [89 lb 8 oz (40.597 kg)] 89 lb 8 oz (40.597 kg) (12/02 1715) Last BM Date: 07/02/15 (After 4 dulcolax) General:  Alert, thin/cachectic female, pleasant but tearful in NAD Head:  Normocephalic and atraumatic. Eyes:  Sclera clear, no icterus.  Conjunctiva pink. Ears:  Normal auditory acuity. Mouth:  No deformity or lesions.   Lungs:  Clear throughout to auscultation.  No wheezes, crackles, or rhonchi.  Heart:  Bradycardic.  No murmurs. Abdomen:  Soft, non-distended.  BS present.  Mild epigastric TTP. Rectal:  Deferred  Msk:  Symmetrical without gross deformities.  Muscle wasting noticed. Pulses:  Normal pulses noted. Extremities:  Without clubbing or edema. Neurologic:  Alert and  oriented x4;  grossly normal neurologically. Skin:  Intact without significant lesions or rashes. Psych:  Alert and cooperative. Normal mood and affect.  Lab Results:  Recent Labs  07/03/15 1634  WBC 5.3  HGB 14.1  HCT 42.4  PLT 210   BMET  Recent Labs  07/03/15 1634  NA 142  K 4.3  CL 103  CO2 34*  GLUCOSE 99  BUN 12  CREATININE 0.70  CALCIUM 9.7   LFT  Recent Labs  07/03/15 1634  PROT 7.3  ALBUMIN 4.4  AST 14*  ALT 10*  ALKPHOS 64  BILITOT 0.7   Hepatitis Panel  Recent Labs  07/03/15 1634  HEPBSAG Negative  HCVAB <0.1  HEPAIGM Negative  HEPBIGM Negative   Studies/Results: Ct Abdomen Pelvis W Contrast  07/03/2015  CLINICAL DATA:  Chronic back pain with worsening right lower quadrant abdominal pain. History of cervical cancer. Unexplained weight loss. EXAM: CT ABDOMEN AND PELVIS WITH CONTRAST TECHNIQUE: Multidetector CT imaging of the abdomen and pelvis was performed using the standard protocol following bolus administration of intravenous contrast. CONTRAST:  1 OMNIPAQUE IOHEXOL 300 MG/ML SOLN, 74mL OMNIPAQUE IOHEXOL 300 MG/ML SOLN COMPARISON:  Prior CT 03/01/2015 and 05/27/2014.  FINDINGS: Lower chest: Stable tiny right lower lobe nodule on image 7. The lung bases are otherwise clear. Hepatobiliary: The liver appear stable without suspicious findings. No evidence of gallstones, gallbladder wall thickening or biliary dilatation. Pancreas: Unremarkable. No pancreatic ductal dilatation or surrounding inflammatory changes. Spleen: Normal in size without focal abnormality. Adrenals/Urinary Tract: Both adrenal glands appear normal. The kidneys appear normal without evidence of urinary tract calculus, suspicious lesion or hydronephrosis. No bladder abnormalities are seen. Stomach/Bowel: No evidence of bowel wall thickening, distention or surrounding inflammatory change. The appendix is not as confidently visualized on the current examination, although based on its location previously is probably visualized on axial image 60 and without surrounding inflammation. Vascular/Lymphatic: There are no enlarged abdominal or pelvic lymph nodes. Moderate aortoiliac atherosclerosis noted. Reproductive: Uterine atrophy. No evidence of adnexal mass. The left ovarian vein is mildly dilated, but stable. Other: No evidence of abdominal wall mass or hernia. Musculoskeletal: No acute or significant osseous findings. There are relatively mild degenerative changes within the spine, although there is a broad-based right lateral disc herniation at L2-3. IMPRESSION: 1. No acute findings or clear explanation for the patient's symptoms. 2. No evidence of metastatic disease. 3. Broad-based right lateral disc herniation at L2-3 could be symptomatic on the basis of right L2 nerve root encroachment. Correlate clinically. Electronically Signed   By: Richardean Sale  M.D.   On: 07/03/2015 21:40   IMPRESSION:  -54 year old female with weight loss and abdominal pain.  Had this complaints when evaluated by [place] endoscopically in the past so they are chronic, intermittent issues.  Recent CT scans negative.  Has a lot of anxiety and  depression.  Suspect that her symptoms are related to that.  PLAN: -No further GI evaluation as inpatient.   -Consider psych consult for management of anxiety and depression. -Will restart regular diet.  Encourage PO intake.  Will likely benefit from nutritional consult to help with supplement management, etc.  ZEHR, JESSICA D.  07/04/2015, 9:05 AM  Pager number [phone]     ________________________________________________________________________  Velora Heckler GI MD note:  I personally examined the patient, reviewed the data and agree with the assessment and plan described above.  She had GI workup 5 years ago by Dr. Henrene Pastor for essentially the same issues. At that time weighed 95 pounds, now is 89.  Her biggest complaint this morning is back pains actually.  Significant psych issues, chronic pain issues.  No plans for further GI testing given normal labs, normal CT scan.     Owens Loffler, MD [facility] Pager [phone]

## 2015-07-04 NOTE — Progress Notes (Signed)
PT Cancellation Note  Patient Details Name: [name] MRN: [id number] DOB: Nov 24, 1960   Cancelled Treatment:     PT eval order received but not completed.  Pt with BP of 88/44.  RN aware and concurs with deferral.  Will follow.   Javaria Knapke 07/04/2015, 9:48 AM

## 2015-07-04 NOTE — Progress Notes (Signed)
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 Her abdominal pain appears to be dating back to July this year and so was seen in the ED had a normal abdominal CT. She also has unexplained weight loss for past 6 months. He had a normal EGD and colonoscopy in 2009-2004 weight loss. Patient sent for direct admission for progressively worsening abdominal pain with significant weight loss .  Assessment/Plan: Principal Problem:  Progressive Abdominal pain Symptoms ongoing for at least 4 months associated with progressive weight loss and decreased by mouth intake with dysphagia. -Admit under observation. -Pain control with when necessary oxycodone and IV morphine. Supportive care with IV hydration (D5 with normal saline) and antiemetics. -Labs (CBC, comprehensive panel, lipase, HIV, CEA and CA-19-9) unremarkable. -Normal EGD and colonoscopy in 2009-2010 by [facility]. CT abdomen and pelvis unremarkable for intra-abdominal abnormality. GI consulted who recommended this is possibly associated with her severe anxiety and depression and no underlying GI issues. -Start on regular diet with nutrition consult. Psychiatry consulted.   Active Problems:  Depression with anxiety Resume home medications. Psych consulted as her underlying abdominal pain symptoms or dysphagia possibly related to her anxiety and depression.   COPD, severe ([place]) Resume home inhaler. Counseled strongly on smoking cessation.  nicotine patch.   GERD Continue IV PPI   Chronic pain syndrome Chronic back pain  secondary to traumatic cervical injury. On oxycodone when necessary and Neurontin which is continued. (Increase frequency of oxycodone). Added bowel regimen.  Low back pain CT shows right lateral disc herniation at L2-L3. Has localized pain without radiation. Continue pain medications. PT evaluation. Check vitamin D level.  Severe malnutrition  Nutrition consult.  Code Status: full code Family Communication: none at bedside Disposition Plan: home once pain better   Consultants:  [facility]  Procedures:  CT abdomen and pelvis  Antibiotics:  none  HPI/Subjective: Seen and examined.  abdominal slightly better.  Objective: Filed Vitals:   07/04/15 0242 07/04/15 0557  BP: 86/50 88/44  Pulse: 53 52  Temp:  97.5 F (36.4 C)  Resp:  16    Intake/Output Summary (Last 24 hours) at 07/04/15 1229 Last data filed at 07/04/15 0755  Gross per 24 hour  Intake      0 ml  Output      0 ml  Net      0 ml   Filed Weights   07/03/15 1715  Weight: 40.597 kg (89 lb 8 oz)    Exam:   General:  Middle aged cachectic female not in distress  HEENT: No pallor, dry oral mucosa, supple neck  Chest: Clear to auscultation bilaterally  CVS: Normal S1 and S2, no murmurs  GI: Soft,ND, BS+, RLQ tenderness  Musculoskeletal: warm, no edema, right low back tenderness   Data Reviewed: Basic Metabolic Panel:  Recent Labs Lab 07/03/15 1634  NA 142  K 4.3  CL 103  CO2 34*  GLUCOSE 99  BUN 12  CREATININE 0.70  CALCIUM 9.7  MG 2.2  PHOS 4.2   Liver Function Tests:  Recent Labs Lab 07/03/15 1634  AST 14*  ALT 10*  ALKPHOS 64  BILITOT 0.7  PROT 7.3  ALBUMIN 4.4    Recent Labs Lab 07/04/15 0625  LIPASE 21   No results for input(s): AMMONIA in the last 168 hours. CBC:  Recent Labs Lab 07/03/15 1634  WBC 5.3  NEUTROABS 2.5  HGB 14.1  HCT 42.4  MCV 96.8  PLT 210   Cardiac Enzymes: No results for input(s): CKTOTAL, CKMB, CKMBINDEX, TROPONINI in the last  168 hours. BNP (last 3 results) No results for input(s): BNP in the last 8760 hours.  ProBNP (last 3 results) No results for input(s): PROBNP in the last 8760 hours.  CBG: No results for input(s): GLUCAP in the last 168 hours.  No results found for this or any previous visit (from the past 240 hour(s)).   Studies: Ct Abdomen Pelvis W Contrast  07/03/2015  CLINICAL DATA:  Chronic back pain with worsening right lower quadrant abdominal pain. History of cervical cancer. Unexplained weight loss. EXAM: CT ABDOMEN AND PELVIS WITH CONTRAST TECHNIQUE: Multidetector CT imaging of the abdomen and pelvis was performed using the standard protocol following bolus administration of intravenous contrast. CONTRAST:  1 OMNIPAQUE IOHEXOL 300 MG/ML SOLN, 34mL OMNIPAQUE IOHEXOL 300 MG/ML SOLN COMPARISON:  Prior CT 03/01/2015 and 05/27/2014. FINDINGS: Lower chest: Stable tiny right lower lobe nodule on image 7. The lung bases are otherwise clear. Hepatobiliary: The liver appear stable without suspicious findings. No evidence of gallstones, gallbladder wall thickening or biliary dilatation. Pancreas: Unremarkable. No pancreatic ductal dilatation or surrounding inflammatory changes. Spleen: Normal in size without focal abnormality. Adrenals/Urinary Tract: Both adrenal glands appear normal. The kidneys appear normal without evidence of urinary tract calculus, suspicious lesion or hydronephrosis. No bladder abnormalities are seen. Stomach/Bowel: No evidence of bowel wall thickening, distention or surrounding inflammatory change. The appendix is not as confidently visualized on the current examination, although based on its location previously is probably visualized on axial image 60 and without surrounding inflammation. Vascular/Lymphatic: There are no enlarged abdominal or pelvic lymph nodes. Moderate aortoiliac atherosclerosis noted. Reproductive: Uterine atrophy. No evidence of adnexal mass. The left ovarian vein is mildly  dilated, but stable. Other: No evidence of abdominal wall mass or hernia. Musculoskeletal: No acute or significant osseous findings. There are relatively mild degenerative changes within the spine, although there is a broad-based right lateral disc herniation at L2-3. IMPRESSION: 1. No acute findings or clear explanation for the patient's symptoms. 2. No evidence of metastatic disease. 3. Broad-based right lateral disc herniation at L2-3 could be symptomatic on the basis of right L2 nerve root encroachment. Correlate clinically. Electronically Signed   By: Richardean Sale M.D.   On: 07/03/2015 21:40    Scheduled Meds: . budesonide-formoterol  2 puff Inhalation BID  . chlorhexidine  15 mL Mouth/Throat BID  . enoxaparin (LOVENOX) injection  30 mg Subcutaneous Q24H  . feeding supplement  237 mL Oral BID  . [START ON 07/05/2015] fentaNYL  100 mcg Transdermal Q72H  . gabapentin  300 mg Oral BID  . nicotine  21 mg Transdermal Daily  . pantoprazole (PROTONIX) IV  40 mg Intravenous Q24H  . polyethylene glycol  17 g Oral Daily  . tiotropium  18 mcg Inhalation Daily   Continuous Infusions: . dextrose 5 % and 0.9% NaCl 100 mL/hr at 07/03/15 2129      Time spent: [address] [facility]  [facility] Pager [phone]. If 7PM-7AM, please contact night-coverage at [email or web address], password [facility] 07/04/2015, 12:29 PM  LOS: 1 day

## 2015-07-05 DIAGNOSIS — Z681 Body mass index (BMI) 19 or less, adult: Secondary | ICD-10-CM | POA: Diagnosis not present

## 2015-07-05 DIAGNOSIS — Z79899 Other long term (current) drug therapy: Secondary | ICD-10-CM | POA: Diagnosis not present

## 2015-07-05 DIAGNOSIS — M810 Age-related osteoporosis without current pathological fracture: Secondary | ICD-10-CM | POA: Diagnosis not present

## 2015-07-05 DIAGNOSIS — R64 Cachexia: Secondary | ICD-10-CM | POA: Diagnosis not present

## 2015-07-05 DIAGNOSIS — K219 Gastro-esophageal reflux disease without esophagitis: Secondary | ICD-10-CM | POA: Diagnosis not present

## 2015-07-05 DIAGNOSIS — M549 Dorsalgia, unspecified: Secondary | ICD-10-CM | POA: Diagnosis not present

## 2015-07-05 DIAGNOSIS — F17210 Nicotine dependence, cigarettes, uncomplicated: Secondary | ICD-10-CM | POA: Diagnosis not present

## 2015-07-05 DIAGNOSIS — R109 Unspecified abdominal pain: Secondary | ICD-10-CM | POA: Diagnosis not present

## 2015-07-05 DIAGNOSIS — E43 Unspecified severe protein-calorie malnutrition: Secondary | ICD-10-CM | POA: Diagnosis not present

## 2015-07-05 DIAGNOSIS — M797 Fibromyalgia: Secondary | ICD-10-CM | POA: Diagnosis not present

## 2015-07-05 DIAGNOSIS — M1990 Unspecified osteoarthritis, unspecified site: Secondary | ICD-10-CM | POA: Diagnosis not present

## 2015-07-05 DIAGNOSIS — R1031 Right lower quadrant pain: Secondary | ICD-10-CM | POA: Diagnosis not present

## 2015-07-05 DIAGNOSIS — R1030 Lower abdominal pain, unspecified: Secondary | ICD-10-CM

## 2015-07-05 DIAGNOSIS — R634 Abnormal weight loss: Secondary | ICD-10-CM | POA: Diagnosis not present

## 2015-07-05 DIAGNOSIS — Z720 Tobacco use: Secondary | ICD-10-CM

## 2015-07-05 DIAGNOSIS — Z8541 Personal history of malignant neoplasm of cervix uteri: Secondary | ICD-10-CM | POA: Diagnosis not present

## 2015-07-05 DIAGNOSIS — F418 Other specified anxiety disorders: Secondary | ICD-10-CM | POA: Diagnosis not present

## 2015-07-05 DIAGNOSIS — Z79891 Long term (current) use of opiate analgesic: Secondary | ICD-10-CM | POA: Diagnosis not present

## 2015-07-05 DIAGNOSIS — G894 Chronic pain syndrome: Secondary | ICD-10-CM | POA: Diagnosis not present

## 2015-07-05 DIAGNOSIS — R627 Adult failure to thrive: Secondary | ICD-10-CM | POA: Diagnosis not present

## 2015-07-05 DIAGNOSIS — J449 Chronic obstructive pulmonary disease, unspecified: Secondary | ICD-10-CM | POA: Diagnosis not present

## 2015-07-05 MED ORDER — PANTOPRAZOLE SODIUM 40 MG PO TBEC: 40.0000 mg | DELAYED_RELEASE_TABLET | Freq: Every day | ORAL | Status: DC

## 2015-07-05 NOTE — Discharge Summary (Signed)
Physician Discharge Summary  [name] [id number] DOB: [birth year] DOA: 07/03/2015  PCP: Hoyt Koch, MD  Admit date: 07/03/2015 Discharge date: 07/05/2015  Time spent: 20 minutes  Recommendations for Outpatient Follow-up:  Patient left AMA. Instructed to follow-up with her PCP as outpatient.  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 Her abdominal pain appears to be dating back to July this year and so was seen in the ED had a normal abdominal CT. She also has unexplained weight loss for past 6 months. He had a normal EGD and colonoscopy in 2009-2004 weight loss. Patient sent for direct admission for progressively worsening abdominal pain with significant weight loss .  Hospital Course:  Principal Problem:  Progressive Abdominal pain Symptoms ongoing for at least 4 months associated with progressive weight loss and decreased by mouth intake with dysphagia. Given pain control, IV hydration and antiemetics. -Labs (CBC, comprehensive panel, lipase, HIV, CEA and CA-19-9) unremarkable. -Normal EGD and colonoscopy in 2009-2010 by [facility]. CT abdomen and pelvis unremarkable for intra-abdominal abnormality. GI consulted who recommended this is possibly  associated with her severe anxiety and depression and no underlying GI issues. -Tolerating regular diet. Psych consulted for evaluation of anxiety and depression. However patient wished to go home this morning to take care of her dog. I instructed her that I would send her home later today after seen by psychiatry and recommendations given. Added also plan on sending her out on a better pain regimen but she did not wish to stay longer. -Patient signed herself out AMA. I have instructed her to follow-up with her PCP in one week. No prescriptions were given.   Active Problems:  Depression with anxiety Resumed home medications. Psych consulted as her underlying abdominal pain symptoms or dysphagia possibly related to her anxiety and depression. Left AMA before being seen.   COPD, severe ([place]) Resumed home inhaler. Counseled strongly on smoking cessation. Nicotine patch placed while in the hospital.     Chronic pain syndrome Chronic back pain secondary to traumatic cervical injury. On oxycodone when necessary and Neurontin which was continued with increased frequency.  Low back pain CT shows right lateral disc herniation at L2-L3. Has localized pain without radiation. Given pain medications. PT evaluation ordered but left AMA before being seen.  Severe malnutrition On supplements at home. Dietitian consulted but patient left AMA.  Code Status: full code Family Communication: none at bedside    Consultants:  [facility]  Procedures:  CT abdomen and pelvis  Antibiotics:  none   Discharge Exam: Filed Vitals:   07/05/15 0207 07/05/15 0517  BP: 96/60 95/61  Pulse: 61 58  Temp:  97.8 F (36.6 C)  Resp:  18     General: Middle aged cachectic female not  in distress  HEENT: No pallor, dry oral mucosa, supple neck  Chest: Clear to auscultation bilaterally  CVS: Normal S1 and S2, no murmurs  GI: Soft,ND, BS+, right lower quadrant tenderness  resolved.  Musculoskeletal: warm, no edema, mild right low back tenderness  Discharge Instructions    Discharge Medication List as of 07/05/2015 11:53 AM    CONTINUE these medications which have NOT CHANGED   Details  ALPRAZolam (XANAX) 1 MG tablet Take 1 tablet (1 mg total) by mouth 3 (three) times daily as needed for anxiety., Starting 03/30/2015, Until Discontinued, Print    bisacodyl (DULCOLAX) 5 MG EC tablet Take 5 mg by mouth daily as needed for mild constipation or moderate constipation., Until Discontinued, Historical Med    budesonide-formoterol (SYMBICORT) 160-4.5 MCG/ACT inhaler Inhale 2 puffs into the lungs 2 (two) times daily., Starting 11/18/2014, Until Discontinued, Normal    cyanocobalamin (,VITAMIN B-12,) 1000 MCG/ML injection Inject 1,000 mcg into the muscle once., Historical Med    esomeprazole (NEXIUM) 40 MG capsule TAKE 1 CAPSULE (40 MG TOTAL) BY MOUTH DAILY., Normal    fentaNYL (DURAGESIC - DOSED MCG/HR) 100 MCG/HR Place 1 patch (100 mcg total) onto the skin every 3 (three) days., Starting 06/17/2015, Until Discontinued, Print    gabapentin (NEURONTIN) 300 MG capsule TAKE 1 CAPSULE (300 MG TOTAL) BY MOUTH 2 (TWO) TIMES DAILY., Normal    lactose free nutrition (BOOST) LIQD Take 237 mLs by mouth 2 (two) times daily. Vanilla, Until Discontinued, Historical Med    metoCLOPramide (REGLAN) 5 MG tablet Take 1 tablet (5 mg total) by mouth every 8 (eight) hours as needed for nausea., Starting 07/01/2015, Until Discontinued, Normal    oxycodone (OXY-IR) 5 MG capsule Take 1 capsule (5 mg total) by mouth 2 (two) times daily as needed for pain. 30 day supply, Starting 06/17/2015, Until Discontinued, Print    polyethylene glycol powder (GLYCOLAX/MIRALAX) powder TAKE ONE CAP FULL DAILY, Normal    tiotropium (SPIRIVA HANDIHALER) 18 MCG inhalation capsule Place 1 capsule (18 mcg total) into inhaler and inhale daily., Starting 11/27/2014, Until Discontinued, Normal    dicyclomine  (BENTYL) 20 MG tablet Take 1 tablet (20 mg total) by mouth 2 (two) times daily., Starting 03/01/2015, Until Discontinued, Print       Allergies  Allergen Reactions  . Aripiprazole Other (See Comments)    Suicidal ideation, also with SSRI  . Chantix [Varenicline] Other (See Comments)    Irritability and depressed mood  . Pregabalin Hives    On trunk of body  . Zolpidem Tartrate Other (See Comments)    Unknown- pt states she does not know about this allergy    Follow-up Information    Follow up with Hoyt Koch, MD.   Specialty:  Internal Medicine   Contact information:   [place] [address] [phone]        The results of significant diagnostics from this hospitalization (including imaging, microbiology, ancillary and laboratory) are listed below for reference.    Significant Diagnostic Studies: Ct Abdomen Pelvis W Contrast  07/03/2015  CLINICAL DATA:  Chronic back pain with worsening right lower quadrant abdominal pain. History of cervical cancer. Unexplained weight loss. EXAM: CT ABDOMEN AND PELVIS WITH CONTRAST TECHNIQUE: Multidetector CT imaging of the abdomen and pelvis was performed using the standard protocol following bolus administration of intravenous contrast. CONTRAST:  1 OMNIPAQUE IOHEXOL 300 MG/ML SOLN, 52mL OMNIPAQUE IOHEXOL 300 MG/ML SOLN COMPARISON:  Prior CT 03/01/2015 and 05/27/2014. FINDINGS: Lower chest: Stable  tiny right lower lobe nodule on image 7. The lung bases are otherwise clear. Hepatobiliary: The liver appear stable without suspicious findings. No evidence of gallstones, gallbladder wall thickening or biliary dilatation. Pancreas: Unremarkable. No pancreatic ductal dilatation or surrounding inflammatory changes. Spleen: Normal in size without focal abnormality. Adrenals/Urinary Tract: Both adrenal glands appear normal. The kidneys appear normal without evidence of urinary tract calculus, suspicious lesion or hydronephrosis. No  bladder abnormalities are seen. Stomach/Bowel: No evidence of bowel wall thickening, distention or surrounding inflammatory change. The appendix is not as confidently visualized on the current examination, although based on its location previously is probably visualized on axial image 60 and without surrounding inflammation. Vascular/Lymphatic: There are no enlarged abdominal or pelvic lymph nodes. Moderate aortoiliac atherosclerosis noted. Reproductive: Uterine atrophy. No evidence of adnexal mass. The left ovarian vein is mildly dilated, but stable. Other: No evidence of abdominal wall mass or hernia. Musculoskeletal: No acute or significant osseous findings. There are relatively mild degenerative changes within the spine, although there is a broad-based right lateral disc herniation at L2-3. IMPRESSION: 1. No acute findings or clear explanation for the patient's symptoms. 2. No evidence of metastatic disease. 3. Broad-based right lateral disc herniation at L2-3 could be symptomatic on the basis of right L2 nerve root encroachment. Correlate clinically. Electronically Signed   By: Richardean Sale M.D.   On: 07/03/2015 21:40    Microbiology: No results found for this or any previous visit (from the past 240 hour(s)).   Labs: Basic Metabolic Panel:  Recent Labs Lab 07/03/15 1634  NA 142  K 4.3  CL 103  CO2 34*  GLUCOSE 99  BUN 12  CREATININE 0.70  CALCIUM 9.7  MG 2.2  PHOS 4.2   Liver Function Tests:  Recent Labs Lab 07/03/15 1634  AST 14*  ALT 10*  ALKPHOS 64  BILITOT 0.7  PROT 7.3  ALBUMIN 4.4    Recent Labs Lab 07/04/15 0625  LIPASE 21   No results for input(s): AMMONIA in the last 168 hours. CBC:  Recent Labs Lab 07/03/15 1634  WBC 5.3  NEUTROABS 2.5  HGB 14.1  HCT 42.4  MCV 96.8  PLT 210   Cardiac Enzymes: No results for input(s): CKTOTAL, CKMB, CKMBINDEX, TROPONINI in the last 168 hours. BNP: BNP (last 3 results) No results for input(s): BNP in the last  8760 hours.  ProBNP (last 3 results) No results for input(s): PROBNP in the last 8760 hours.  CBG: No results for input(s): GLUCAP in the last 168 hours.     SignedLouellen Molder  [facility] 07/05/2015, 12:44 PM

## 2015-07-05 NOTE — Progress Notes (Signed)
Patient leaving AMA.  Patient signed paperwork and understands the risks involved in leaving AMA.  A&O x4.  Room air.  Leaving with personal belongings.  No complaints.  Reports she will follow-up with her physician on Monday 07/06/15.

## 2015-07-05 NOTE — Progress Notes (Signed)
Notified physician that patient called me to room and reports she wants to leave now.  She is dressed, teary, and reports she does not want to talk to a psychiatrist, see a nutritionist, or physical therapy.  She wants to go home and see her dog.  Patient reports she drove herself here and will drive herself home.  Physician speaks to patient via telephone.  I will obtain AMA paperwork and patient will be discharged AMA.

## 2015-07-06 ENCOUNTER — Other Ambulatory Visit: Payer: Self-pay | Admitting: *Deleted

## 2015-07-06 DIAGNOSIS — J449 Chronic obstructive pulmonary disease, unspecified: Secondary | ICD-10-CM

## 2015-07-06 NOTE — Patient Outreach (Signed)
Transition of care call (f/u on MD referral in Epic, pt left AMA 12/4): Spoke with pt, HIPPA verified.  Pt states she left AMA because they wanted her to talk to nutritionist, psychiatrist which she does not need, just worries a lot.   Pt reports not feeling too good- tired, stomach and back pain, legs weak.   Pt states lives alone.  Pt states her pain comes and goes, not hurting right now, took pain medication- helps.   Pt states appetite not good, weight is 89 lbs, drinking 2 cans of Boost a day.  Pt states Dr. Sharlet Salina started her on Reglan 12/1.   RN CM discussed with pt doing a home visit as well as  f/u with weekly phone calls for 31 days post discharge to which pt agreed.  Home visit scheduled for 12/9.   RN CM also discussed doing a CSW referral to which pt agreed.   As discussed, RN CM to call pt again today- see if scheduled post discharge office visit with Dr. Sharlet Salina.  Plan to f/u with pt 12/9- home visit.   Plan to do CSW referral.    [name].   [place] [facility]  [phone]

## 2015-07-06 NOTE — Discharge Planning (Addendum)
Patient left AMA after arranging for direct admission. I reviewed her labs and will communicate with her PCP-poorly controlled anxiety, pain and depression are critical components of her current situation. Labs suggested UTI as well as the possibility for L2 disc herniation which is likely symptomatic. She is on high dose opiates and given her presentation I recommend strong consideration for transitioning her over to methadone and referral to complex pain management clinic (or I am happy to advise on methadone dosing and transition) - consider referral to Dr. Clydell Hakim for evaluation for nerve root ablation L-spine. For her anxiety and weight loss consider starting Xyprexa 5mg  qhs- challenge will be on getting her to actually take this medication. Await UTI culture, start oral antibiotics. She has significantly low B12, please begin IM injections in outpatient setting. Please contact Palliative team if we can assist with symptom management in this high risk patient. Will place order for [facility] care management- she would benefit from in home social work help.  Lane Hacker, DO Palliative Medicine [phone]

## 2015-07-07 DIAGNOSIS — J449 Chronic obstructive pulmonary disease, unspecified: Secondary | ICD-10-CM | POA: Diagnosis not present

## 2015-07-08 ENCOUNTER — Other Ambulatory Visit: Payer: Self-pay | Admitting: Licensed Clinical Social Worker

## 2015-07-08 NOTE — Patient Outreach (Signed)
[facility]) Care Management  07/08/2015  KIALEY PRETTYMAN Jun 15, 1961 [id number]   Assessment-CSW contacted [place]. RNCM will be complete home visit with patient on 07/10/15 and will provide further updates and evaluation on social work needs at that time.  Plan-CSW will await to hear back from [place] before completing outreach to patient.  Eula Fried, BSW, MSW, [email or web address] Phone: [phone] Fax: [phone]

## 2015-07-10 ENCOUNTER — Other Ambulatory Visit: Payer: Self-pay | Admitting: *Deleted

## 2015-07-10 ENCOUNTER — Ambulatory Visit: Payer: Self-pay | Admitting: *Deleted

## 2015-07-10 NOTE — Patient Outreach (Signed)
As requested called pt prior to home visit (scheduled for 11 am) to which pt states not feeling good today.   Pt requested to reschedule home visit, as discussed- RN CM to call  Pt back 12/12 to check on status, see about doing visit that day.  Pt reports  appetite is better, back pain- cold weather makes it worse.   Pt states she is f/u with MD 12/15.   Plan to f/u with pt again 12/12.   [name].   [place] [facility]  [phone]

## 2015-07-13 ENCOUNTER — Other Ambulatory Visit: Payer: Self-pay | Admitting: *Deleted

## 2015-07-13 NOTE — Patient Outreach (Signed)
Follow up phone call:  Pt requested RN CM to  call back in 15 minutes, in the middle of doing something.    RN CM to call back as requested.     [name].   [place] [facility]  [phone]

## 2015-07-13 NOTE — Patient Outreach (Signed)
Received a call from pt (earlier call today by RN CM- pt unable to talk).   Pt reports doing better since came home from hospital.   Pt states she can eat a little better,currently 89 lbs., told to increase protein.  Pt states her stomach hurts after she eats, has small amounts. Pt reports she hurts all the time, been on pain medication so long, question if working.  Pt states to f/u with MD 12/15.   Pt states she is breathing better when use oxygen.   Pt states her BP runs low.   RN CM discussed with pt doing a home visit 12/14 to which pt agreed.     [name].   [place] [facility]  [phone]

## 2015-07-15 ENCOUNTER — Other Ambulatory Visit: Payer: Self-pay | Admitting: *Deleted

## 2015-07-15 NOTE — Patient Outreach (Addendum)
[facility]) Care Management   07/15/2015  Mayerly Kaman [place] April 21, 1961 [id number]  NAZANIN KINNER is an 54 y.o. female  Subjective:  Pt reports on chronic back pain, current dosage not helping( 2 Oxycodone a day), to f/u with Dr. Sharlet Salina tomorrow- let her know need more pain medication.  Pt reports took a laxative yesterday with results,helped stomach pain, don't hurt as bad.  Pt reports poor appetite, used to weigh 130 lbs, now 89 lbs, having 2 Boost a day (daughter provides).   Objective:  O2 sat at rest without oxygen- 95%.   Filed Vitals:   07/15/15 1332  Pulse: 75    ROS  Physical Exam  Constitutional: She is oriented to person, place, and time.  Thin, frail looking   Cardiovascular: Normal rate and regular rhythm.   Respiratory: Breath sounds normal.  Musculoskeletal: Normal range of motion.  Neurological: She is alert and oriented to person, place, and time.  Skin: Skin is warm and dry.  Psychiatric: She has a normal mood and affect. Her behavior is normal. Judgment and thought content normal.    Current Medications:  Reviewed with pt  Current Outpatient Prescriptions  Medication Sig Dispense Refill  . bisacodyl (DULCOLAX) 5 MG EC tablet Take 5 mg by mouth daily as needed for mild constipation or moderate constipation.    . budesonide-formoterol (SYMBICORT) 160-4.5 MCG/ACT inhaler Inhale 2 puffs into the lungs 2 (two) times daily. 1 Inhaler 11  . esomeprazole (NEXIUM) 40 MG capsule TAKE 1 CAPSULE (40 MG TOTAL) BY MOUTH DAILY. 30 capsule 3  . fentaNYL (DURAGESIC - DOSED MCG/HR) 100 MCG/HR Place 1 patch (100 mcg total) onto the skin every 3 (three) days. 10 patch 0  . gabapentin (NEURONTIN) 300 MG capsule TAKE 1 CAPSULE (300 MG TOTAL) BY MOUTH 2 (TWO) TIMES DAILY. (Patient taking differently: TAKE 1 CAPSULE (300 MG TOTAL) BY MOUTH NIGHTLY.) 60 capsule 3  . lactose free nutrition (BOOST) LIQD Take 237 mLs by mouth 2 (two) times daily. Vanilla    .  metoCLOPramide (REGLAN) 5 MG tablet Take 1 tablet (5 mg total) by mouth every 8 (eight) hours as needed for nausea. 90 tablet 0  . oxycodone (OXY-IR) 5 MG capsule Take 1 capsule (5 mg total) by mouth 2 (two) times daily as needed for pain. 30 day supply 60 capsule 0  . polyethylene glycol powder (GLYCOLAX/MIRALAX) powder TAKE ONE CAP FULL DAILY 255 g 3  . tiotropium (SPIRIVA HANDIHALER) 18 MCG inhalation capsule Place 1 capsule (18 mcg total) into inhaler and inhale daily. 30 capsule 12  . ALPRAZolam (XANAX) 1 MG tablet Take 1 tablet (1 mg total) by mouth 3 (three) times daily as needed for anxiety. 75 tablet 2  . cyanocobalamin (,VITAMIN B-12,) 1000 MCG/ML injection Inject 1,000 mcg into the muscle once. Reported on 07/15/2015    . dicyclomine (BENTYL) 20 MG tablet Take 1 tablet (20 mg total) by mouth 2 (two) times daily. (Patient not taking: Reported on 07/15/2015) 20 tablet 0  . metoCLOPramide (REGLAN) 5 MG tablet Take 5 mg by mouth every 8 (eight) hours as needed.     Current Facility-Administered Medications  Medication Dose Route Frequency Provider Last Rate Last Dose  . cyanocobalamin ((VITAMIN B-12)) injection 1,000 mcg  1,000 mcg Intramuscular Q30 days Acquanetta Chain, DO   1,000 mcg at 02/24/11 1218    Functional Status:   In your present state of health, do you have any difficulty performing the following activities: 07/15/2015 07/03/2015  Hearing? N N  Vision? N N  Difficulty concentrating or making decisions? N N  Walking or climbing stairs? N Y  Dressing or bathing? N N  Doing errands, shopping? N N  Using the Toilet? N -  In the past six months, have you accidently leaked urine? N -  Do you have problems with loss of bowel control? N -  Managing your Medications? N -  Managing your Finances? N -  Housekeeping or managing your Housekeeping? Y -    Fall/Depression Screening:    PHQ 2/9 Scores 07/15/2015 07/01/2014 06/23/2014 06/13/2014 06/04/2014 05/21/2014 04/28/2014   PHQ - 2 Score 0 0 0 0 0 0 0  PHQ- 9 Score - - - - - - -    Assessment:  COPD-  On continuous O2 2LNC. No c/o of sob.  Smokes                          Chronic back pain- pt reports +7 today, on Fentanyl patch, oral pain medication - wants                                                            Frequency increased.                           Impaired nutrition- thin, frail looking, reported weight 89 lbs.                           Pt would benefit from Palliative care services- symptom management.    Plan:   Pt to f/u with Dr. Sharlet Salina 12/15- post discharge visit.               Pt to ask MD about increasing frequency of oral pain medication.              RN CM to continue to follow pt for transition of care, f/u again telephonically 12/21.              As discussed with pt (pt agreeable) plan to  refer  to Palliative Care.               Plan to inform Dr. Sharlet Salina of [facility] involvement- send encounter and letter by in basket in Epic.    THN CM Care Plan Problem One        Most Recent Value   Care Plan Problem One  Risk for readmission- recent hospitalization, left AMA    Role Documenting the Problem One  Care Management [place] for Problem One  Active   THN Long Term Goal (31-90 days)  Pt would not readmit in the next 31 days from day of discharge.    THN Long Term Goal Start Date  07/06/15   Interventions for Problem One Long Term Goal  Discussed with pt importance of f/u with Primary Care MD wiithin 7 days of discharge, ongoing compliance with medicatons    THN CM Short Term Goal #1 (0-30 days)  Pt's nutritional status would improve in the next 30 days    THN CM Short Term Goal #1 Start Date  07/06/15   Interventions for Short Term Goal #  1  Discussed with pt continue to have Boost twice a day, call Doristine Bosworth if need to get more food.    THN CM Short Term Goal #2 (0-30 days)  Pt's abdominal pain would continue to improve  in the next 7 days    THN CM Short Term Goal #2 Start  Date  07/06/15   [facility] CM Short Term Goal #2 Met Date  07/15/15 [not met- 12/14 pt states stomach pain better. ]   Interventions for Short Term Goal #2  Discussed with pt ongoing compliance with medication, take Reglan (new Medication) as needed.       [name].   [place] Care Management  [phone]

## 2015-07-16 ENCOUNTER — Encounter: Payer: Self-pay | Admitting: Internal Medicine

## 2015-07-16 ENCOUNTER — Telehealth: Payer: Self-pay | Admitting: Internal Medicine

## 2015-07-16 ENCOUNTER — Encounter: Payer: Self-pay | Admitting: *Deleted

## 2015-07-16 ENCOUNTER — Ambulatory Visit (INDEPENDENT_AMBULATORY_CARE_PROVIDER_SITE_OTHER): Payer: Medicare Other | Admitting: Internal Medicine

## 2015-07-16 VITALS — BP 100/80 | HR 86 | Temp 97.9°F | Resp 18 | Ht 67.0 in | Wt 92.0 lb

## 2015-07-16 DIAGNOSIS — G894 Chronic pain syndrome: Secondary | ICD-10-CM

## 2015-07-16 DIAGNOSIS — R64 Cachexia: Secondary | ICD-10-CM | POA: Diagnosis not present

## 2015-07-16 DIAGNOSIS — E538 Deficiency of other specified B group vitamins: Secondary | ICD-10-CM | POA: Diagnosis not present

## 2015-07-16 DIAGNOSIS — R1030 Lower abdominal pain, unspecified: Secondary | ICD-10-CM | POA: Diagnosis not present

## 2015-07-16 MED ORDER — OXYCODONE HCL ER 30 MG PO T12A: 30.0000 mg | EXTENDED_RELEASE_TABLET | Freq: Two times a day (BID) | ORAL | Status: DC

## 2015-07-16 MED ORDER — OXYCODONE HCL 5 MG PO CAPS: 5.0000 mg | ORAL_CAPSULE | Freq: Two times a day (BID) | ORAL | Status: DC | PRN

## 2015-07-16 MED ORDER — CYANOCOBALAMIN 1000 MCG/ML IJ SOLN
1000.0000 ug | Freq: Once | INTRAMUSCULAR | Status: AC
  Administered 2015-07-16: 1000 ug via INTRAMUSCULAR

## 2015-07-16 NOTE — Telephone Encounter (Signed)
[place] [facility] [place] Call Center Patient Name: [name] DOB: April 18, 1961 Initial Comment Caller states Dr changed medication and insurance needs pre authorization. Out of Oxyco. med. States she is anxious and is having a panic attack because she is afraid she won't get medication. Nurse Assessment Nurse: Martyn Ehrich, RN, Felicia Date/Time (Eastern Time): 07/16/2015 7:10:53 PM Confirm and document reason for call. If symptomatic, describe symptoms. ---MD changed pt's medication. She was using fentanyl patch and MD said it isn't working bc it absorbs through fat. She only weighs 89# and discharged Sunday from hospital. When she went to pharmacy they said it the insurance was not paying for it. She needs priorauthorization. She was in hospital for dehydration - but MD can't tell why she is loosing so much wt. This pain medication is for chronic back pain after accident. Panicking if she cant get the pain medication. Pain in back is level 8 and she has one dose oxycodone left. Oxycontin was ordered 11 am today and drug store said at 1 pm it was not covered. She saw MD today as f/u after hospitalization. Not worse than when seen. Has the patient traveled out of the country within the last 30 days? ---No Does the patient have any new or worsening symptoms? ---Yes Will a triage be completed? ---Yes Related visit to physician within the last 2 weeks? ---Yes Does the PT have any chronic conditions? (i.e. diabetes, asthma, etc.) ---Yes List chronic conditions. ---COPD Is this a behavioral health or substance abuse call? ---No Nurse: Martyn Ehrich, RN, Felicia Date/Time (Eastern Time): 07/16/2015 7:30:34 PM Please select the assessment type ---Request for controlled medication refill Additional Documentation ---PT wants prior auth to be taken care of [facility]contin [facility] [address] [phone] ) (spoke with Lennon Alstrom says there is additional paperwork after one calls the insurance no. that pt gave for prior auth - she agrees best plan would be have them call office 1st thing in the am (she takes it every 12 hours) and has a pain pill for tonight .1 [phone] is the no. to call for prior auth per pt. Is there an on-call physician for the client? ---Yes Do the client directives specifically allow for paging the on-call regarding scheduled drugs? ---No Additional Documentation ---asked pt to call office first thing in the amGuidelines Guideline Title Affirmed Question Affirmed Notes Final Disposition User Clinical Call Tina, RN, Felicia Comments CLARIFICATION OF PRELIMINARY STMT - [place] ANYMORE  Call Id: [id number]

## 2015-07-16 NOTE — Patient Instructions (Signed)
We are going to have you take off the fentanyl patch when you get the new prescription. It is called oxycontin and is long acting pain relief. Take 1 pill in the morning and 1 pill in the evening every day to keep the pain good. We can adjust the dose as needed.   You can still the other oxy-ir medicine to help with pain that is not covered by the oxycontin.   Call us in about 2 weeks to let us know how the medicine is doing. If it is working well come back in 2 months. If it is not working we will have you come back sooner.

## 2015-07-16 NOTE — Assessment & Plan Note (Signed)
Last shot before today July as she did not come back for shots and did not want to take daily pills. B12 given today and has been low multiple times in the past.

## 2015-07-16 NOTE — Assessment & Plan Note (Signed)
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 She is doing better now that she is moving her bowels again.

## 2015-07-16 NOTE — Assessment & Plan Note (Signed)
Will stop her fentanyl patches as she is likely getting only little to no benefit from them. Will try long acting pain medicine instead with oxycontin and continue the oxy-ir as needed. She also is requesting xanax 2 mg TID and declined that.

## 2015-07-16 NOTE — Assessment & Plan Note (Signed)
Improving slightly and would like to decrease her narcotic burden overall. Rx for reglan seems to be helping as she is not having as much nausea. We talked about also continuing stool softeners to keep her bowels moving as this helps with her pain as well.

## 2015-07-16 NOTE — Telephone Encounter (Signed)
Pt called in and said the meds that was sent in ins is not coving without a pa.  Did you rec a PA on one of her medications?

## 2015-07-16 NOTE — Progress Notes (Signed)
Pre visit review using our clinic review tool, if applicable. No additional management support is needed unless otherwise documented below in the visit note. 

## 2015-07-16 NOTE — Telephone Encounter (Signed)
Pt called state that oxyCODONE (OXYCONTIN) 30 MG 12 hr tablet need PA due to insurance not cover. Please call this # to start the PA [phone] and call pt once we stated this process to let her know.

## 2015-07-16 NOTE — Progress Notes (Signed)
   Subjective:    Patient ID: [name], female    DOB: Jun 24, 1961, 54 y.o.   MRN: [id number]  HPI The patient is a 54 YO female coming in for hospital follow up. She was directly admitted for pain management of her stomach. She did not stay in the hospital for treatment and left shortly thereafter. She is doing a little better with her stomach and able to eat some. Trying to drink boost but cannot afford so not doing daily. She is using dulcolax to keep her stomach moving and this also seems to help the pain. She is having a lot of pain in her back. Wants to know if we can up her medicines. Also wants to double her xanax. Still smoking cigarettes.   Review of Systems  Constitutional: Positive for activity change, appetite change, fatigue and unexpected weight change. Negative for fever.  Respiratory: Positive for shortness of breath. Negative for cough, chest tightness and wheezing.        Stable  Cardiovascular: Negative for chest pain, palpitations and leg swelling.  Gastrointestinal: Positive for abdominal pain and constipation. Negative for nausea, vomiting, diarrhea, blood in stool and abdominal distention.       Improving  Musculoskeletal: Positive for myalgias, back pain and arthralgias. Negative for gait problem.  Neurological: Negative for dizziness, syncope, weakness, light-headedness and headaches.  Psychiatric/Behavioral: Positive for behavioral problems, dysphoric mood, decreased concentration and agitation. Negative for suicidal ideas and self-injury. The patient is nervous/anxious.       Objective:   Physical Exam  Constitutional: She is oriented to person, place, and time.  Very thin and gaunt  HENT:  Head: Normocephalic and atraumatic.  Eyes: EOM are normal.  Neck: Normal range of motion.  Cardiovascular: Normal rate and regular rhythm.   Pulmonary/Chest: Effort normal and breath sounds normal. She has no wheezes. She has no rales.  Abdominal: Soft. Bowel sounds  are normal. There is tenderness.  Mild compared to prior exam, good BS  Neurological: She is alert and oriented to person, place, and time.  Skin: Skin is warm and dry.   Filed Vitals:   07/16/15 1105  BP: 100/80  Pulse: 86  Temp: 97.9 F (36.6 C)  TempSrc: Oral  Resp: 18  Height: 5\' 7"  (1.702 m)  Weight: 92 lb (41.731 kg)  SpO2: 93%      Assessment & Plan:  B12 shot given at visit.

## 2015-07-17 ENCOUNTER — Telehealth: Payer: Self-pay

## 2015-07-17 ENCOUNTER — Other Ambulatory Visit: Payer: Self-pay | Admitting: Internal Medicine

## 2015-07-17 ENCOUNTER — Other Ambulatory Visit: Payer: Self-pay | Admitting: Licensed Clinical Social Worker

## 2015-07-17 DIAGNOSIS — E43 Unspecified severe protein-calorie malnutrition: Secondary | ICD-10-CM

## 2015-07-17 NOTE — Telephone Encounter (Signed)
Patient called and stated that pharmacy would not fill the Oxycontin prescription without a prior authorization due to patients insurance not covering it. PA initiated via covermymeds. The key for this PA is [id number]. Patient was called and notified of the PA being started. She also is aware that it will take 2-5 business days to have an answer back and she states that she is out of medication.

## 2015-07-17 NOTE — Patient Outreach (Signed)
[place] [facility]) Care Management  07/17/2015  CALEY INDOVINA May 22, 1961 [id number]   Addendum- Patient is also agreeable to CSW mailing out complete [employer] for Seniors in [place].  Eula Fried, BSW, MSW, [email or web address] Phone: [phone] Fax: [phone]

## 2015-07-17 NOTE — Telephone Encounter (Signed)
Patient called back regarding the PA on her medication that was sent yesterday.  She gave me the number [phone] to call so she can get her medication today.

## 2015-07-17 NOTE — Patient Outreach (Signed)
[place] [facility]) Care Management  07/17/2015  Joniah Nameth [facility] 1960-11-12 [id number]   Assessment-CSW received updates on patient from [facility]. Patient is agreeable to Palliative Care consult and is in need of resources such as poor self care, nutrition and assistance with repairs in her home.CSW introduced self, reason for call and of [place] social work services. Patient is agreeable to social work assistance and resources. Patient shares that she is in need of repairs within her home. Patient states that she needs new carpet (has been there for over 30 years), front door needs to be fixed, ceilings need painting, trim on walls has started to crack and is in need of outdoor repairs as well. CSW explained that there are several housing repair programs with the [place]. CSW reviewed these various programs with patient and their eligibility requirements. CSW listed the various program such as: [place], [place], [place] and Emergency Repair Program. Patient is agreeable to CSW mailing out a complete list of these resources and their contact information. Patient shares that she has a poor appetite and does not eat much. CSW educated patient on the [place] that provides opportunities for elderly enjoy a nutritious [place] participate in field trips, health and wellness activities and educational and cultural programs. Patient reports that she does not wish to go to the [facility] as she does not feel well. Patient shared that she has missed several opportunities to spend time with her grandson because of her health and needing to stay at home. CSW reviewed ways that patient can improve self care and nutrition at home. Patient is agreeable to CSW mailing out a list of nutrition tips for elderly. CSW completed psychosocial assessment with  patient. Patient is agreeable to CSW contacting her in two weeks to discuss and review resources.  Plan-CSW will send in basket message to [place] Management Assistant to mail out needed resources for patient. CSW will remain available to patient for all social work needs.  Eula Fried, BSW, MSW, [email or web address] Phone: [phone] Fax: [phone]

## 2015-07-20 ENCOUNTER — Encounter: Payer: Self-pay | Admitting: Internal Medicine

## 2015-07-20 MED ORDER — MORPHINE SULFATE ER 30 MG PO TBCR: 30.0000 mg | EXTENDED_RELEASE_TABLET | Freq: Two times a day (BID) | ORAL | Status: DC

## 2015-07-20 NOTE — Patient Outreach (Signed)
[facility] [facility]) Care Management  07/20/2015  [place] 1960-10-22 [id number]   Request from Eula Fried, LCSW to send patient Housing Repairs information and Nutrition Tips for the Elderly. Packet sent 07/20/2015.  Beckett Hickmon L. Yanky Vanderburg, [place] Care Management Assistant

## 2015-07-20 NOTE — Telephone Encounter (Signed)
Received call from pt stating the insurance will not cover the Oxycontin 30 mg but will cover two other med. I couldn't understand pt so i told her i would call [facility] to see what are the alternatives. Called insurance spoke with rep/Keyla she stated the alternative would be Embeda, and or Morphine sulfate with a quantity limit of 12 per day...[name]

## 2015-07-20 NOTE — Telephone Encounter (Signed)
Patient states that she has found a comparable med to oxycontin. States that oxymorphone is covered under her insurance

## 2015-07-20 NOTE — Telephone Encounter (Signed)
Have printed morphine extended release that she can pick up that should be covered.

## 2015-07-20 NOTE — Telephone Encounter (Signed)
Notified pt with md response. Place cabinet up front for pick-up...[name]

## 2015-07-21 ENCOUNTER — Telehealth: Payer: Self-pay

## 2015-07-21 NOTE — Telephone Encounter (Signed)
Patients insurance sent over fax stating that they will not cover oxycontin under the insurance due to the patient not having documentation on trying two other medications prior. The medications that they have sent over for her to try first are embeda, morphone sulfate controlled release tablet, or the nucynta ER. Please advise on what you would like to do.

## 2015-07-21 NOTE — Telephone Encounter (Signed)
Please see phone note prior, morphine controlled released already rxed to try this week.

## 2015-07-22 ENCOUNTER — Other Ambulatory Visit: Payer: Self-pay | Admitting: *Deleted

## 2015-07-22 NOTE — Patient Outreach (Signed)
Transition of care call (week 3- Discharge 12/4).  Spoke with pt who reports doing better.  Pt reports MD took her off the Fentanyl patch, started her on Morphine which she has been taking for 2 days, helping, doing more now. Pt reports cold weather makes her pain worse.   Pt reports appetite is better, MD gave her boost.   RN CM discussed f/u again next week 12/28 telephonically (part of ongoing transition of care).   [name].   [place] Care Management  [phone]

## 2015-07-28 ENCOUNTER — Telehealth: Payer: Self-pay

## 2015-07-28 NOTE — Telephone Encounter (Signed)
Patient states morphine is not working---can she either go back to patch she was using before or can you recommend something else----please advise, thanks

## 2015-07-28 NOTE — Telephone Encounter (Signed)
She will need to be seen so we can figure out what is not working about it.

## 2015-07-28 NOTE — Telephone Encounter (Signed)
Left message for patient to call back to schedule appt with dr crawford---if patient calls back, please schedule office visit with dr Sharlet Salina

## 2015-07-29 ENCOUNTER — Other Ambulatory Visit: Payer: Self-pay | Admitting: *Deleted

## 2015-07-29 NOTE — Patient Outreach (Signed)
Transition of care call (week 4, discharged 12/4):   Pt reports today not a bad day, trying to exercise.  Pt reports Dr. Sharlet Salina  gave her morphine for pain, not working at all.   Pt states she had one Fentanyl patch left, stopped taking the Morphine and applied the patch yesterday, can tell a difference.  Pt reports Dr. Sharlet Salina wants to see her 08/06/15 before starting her on a different pain med. Pt reports when patch is done, will go back to morphine until she see MD.  Pt reports she has more pain in legs than back.  Pt discussed might need to f/u with Orthopedic MD.   Pt reports appetite a lot better, eating more as people are bringing me food (holidays).  RN CM discussed f/u again telephonically 08/06/15 (final transition of care call).    [name].   [place] Care Management  [phone]

## 2015-07-30 IMAGING — CR DG CHEST 2V
2 series · 2 of 2 positions shown · non-contrast
Comparison: 06/13/2012

CLINICAL DATA: Cough. Short of breath.

EXAM:
CHEST  2 VIEW

[w chest lat]
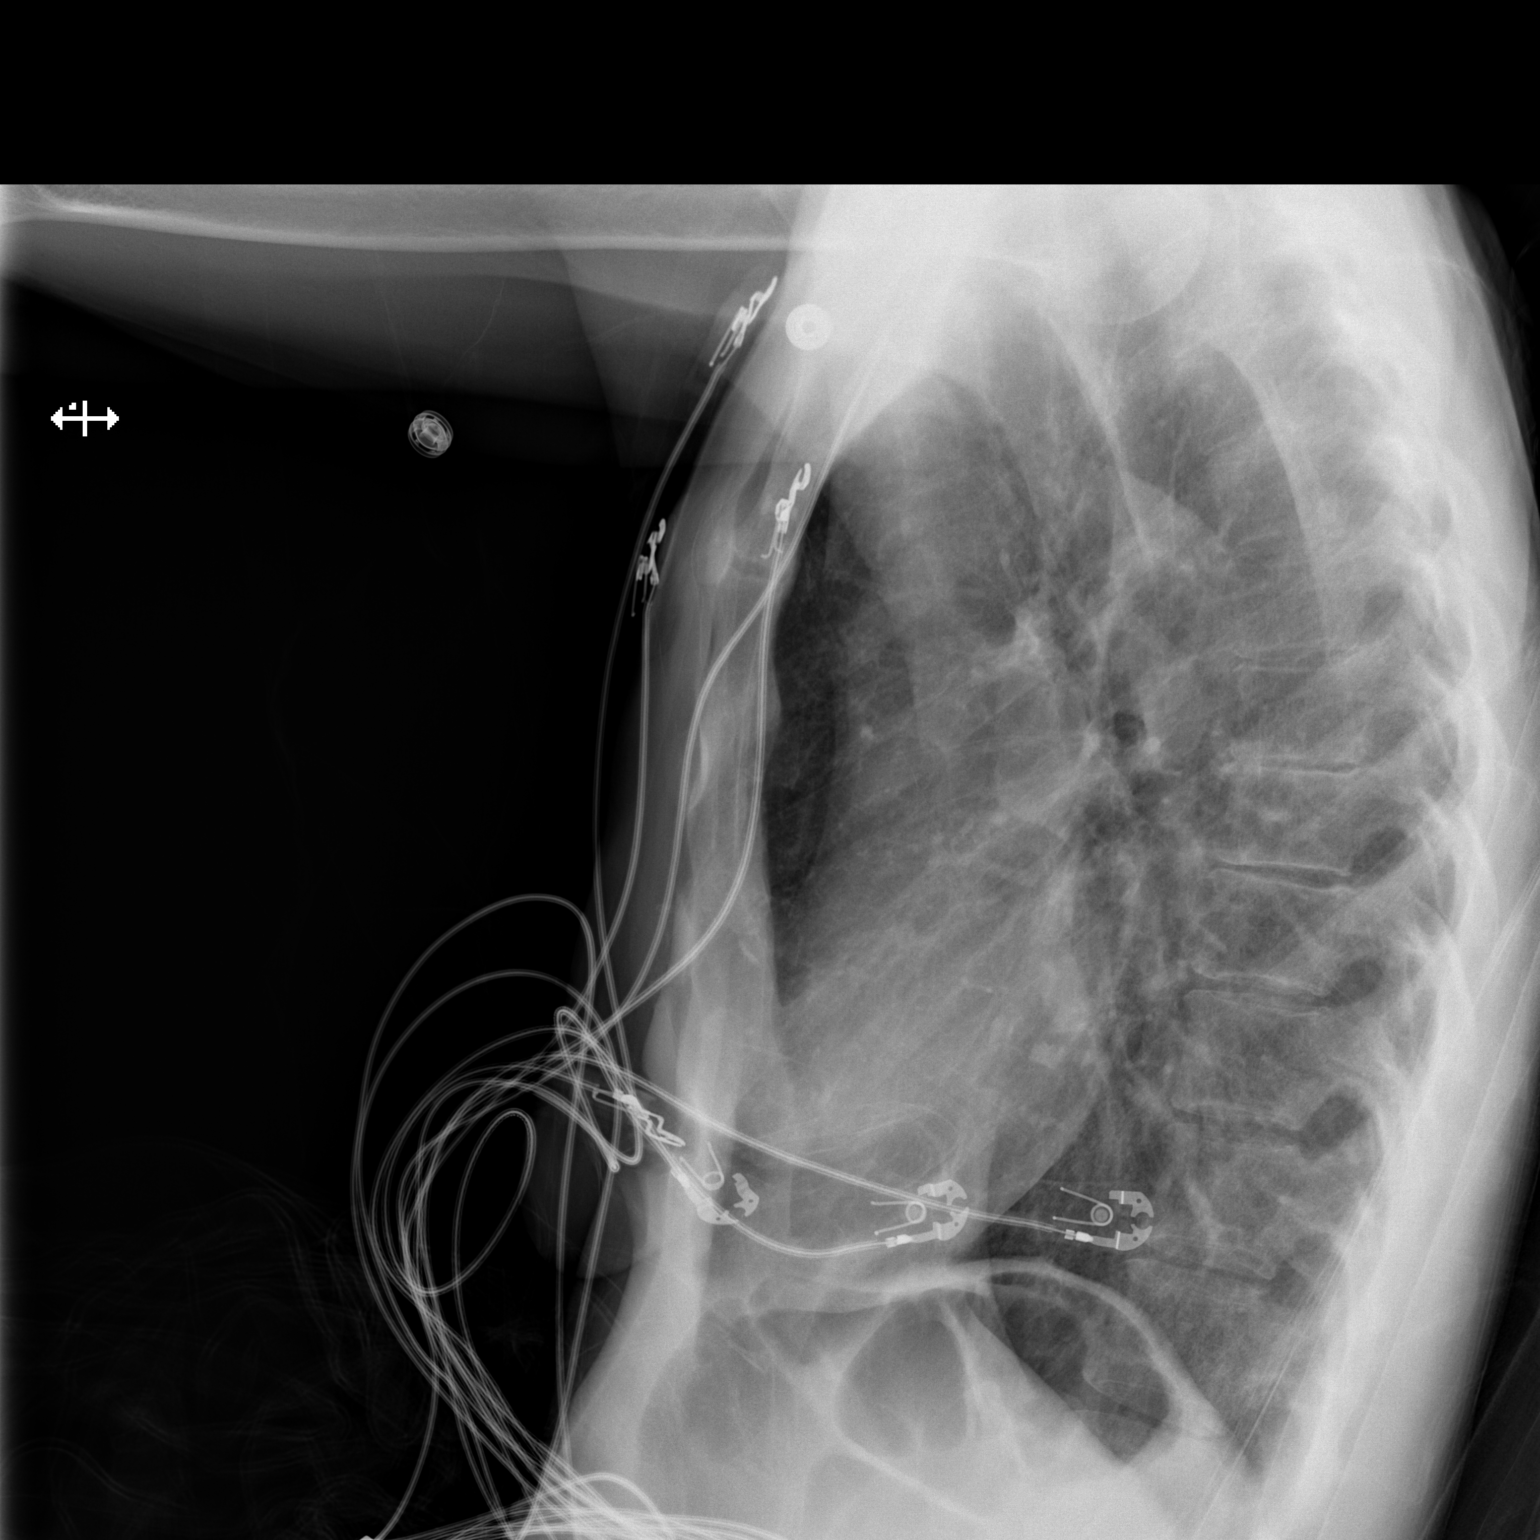

[x chest ap]
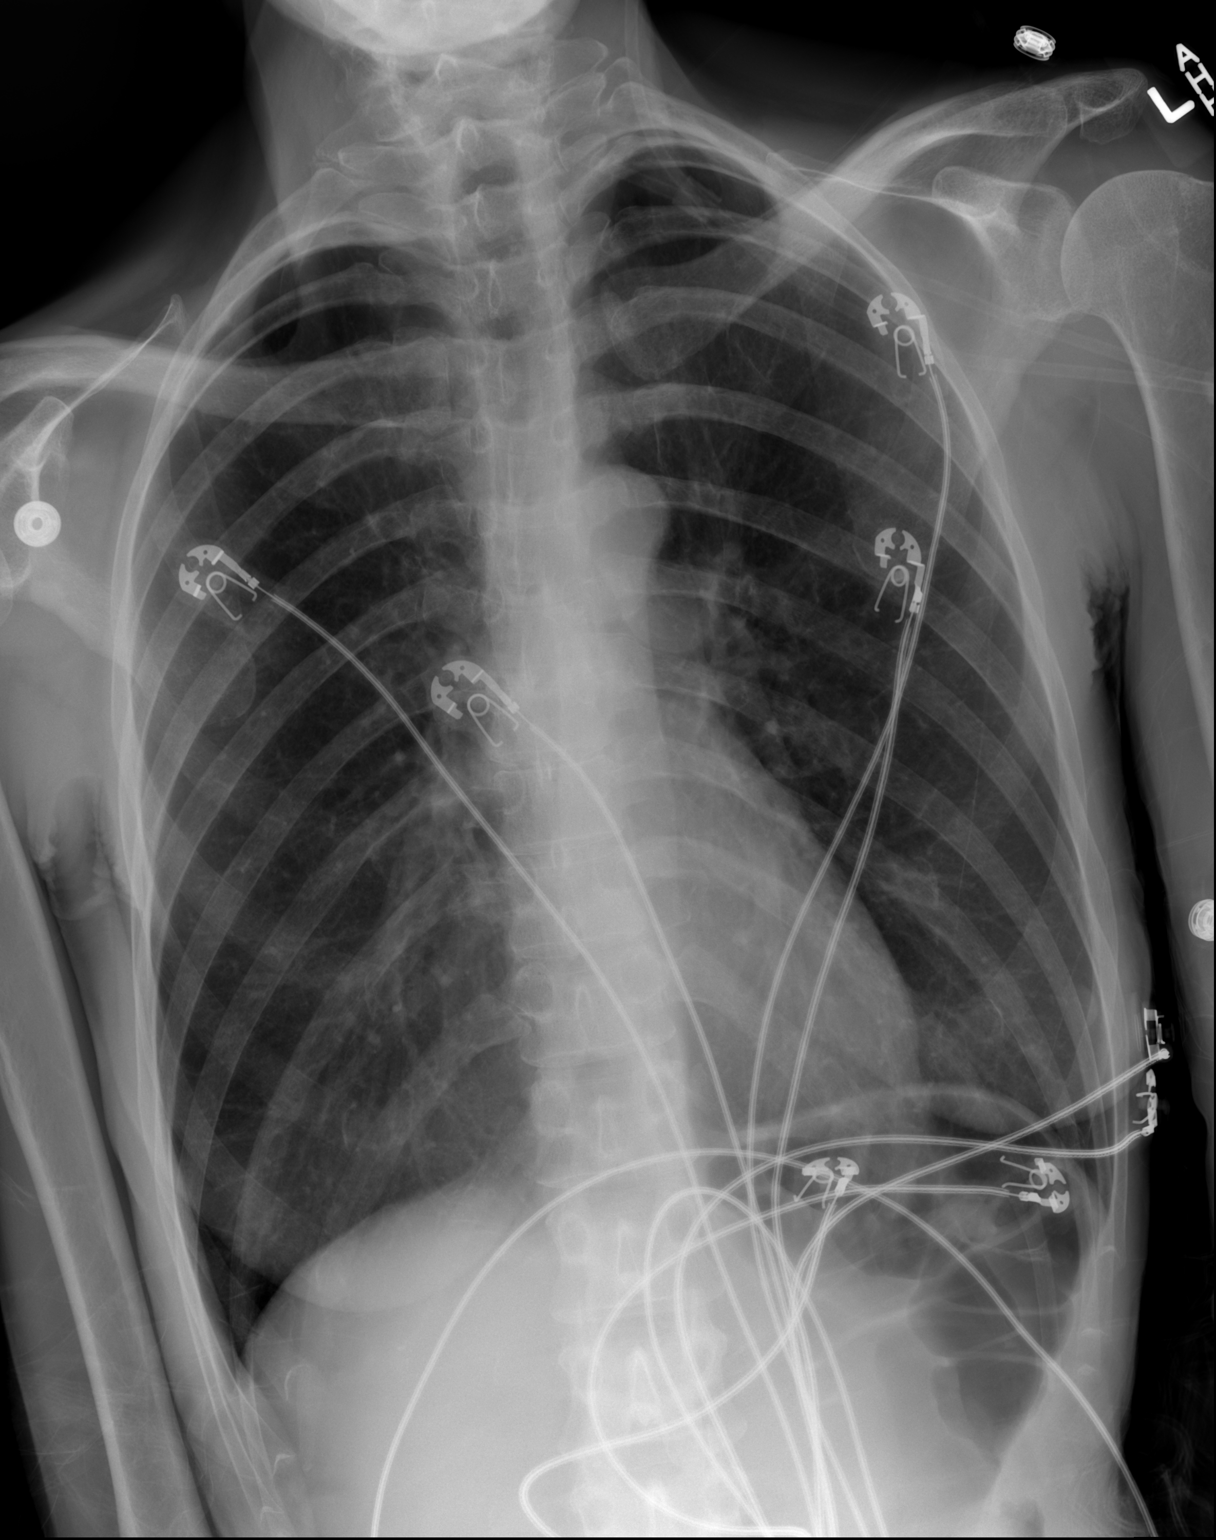

[2 of 2 positions shown; findings below may reference images not displayed]

FINDINGS: The heart size and mediastinal contours are within normal limits.

The lungs are hyperexpanded. The lungs are clear. No pleural
effusion or pneumothorax.

The bony thorax is intact.
IMPRESSION: No acute cardiopulmonary disease.

COPD.

## 2015-08-04 ENCOUNTER — Other Ambulatory Visit: Payer: Self-pay | Admitting: Licensed Clinical Social Worker

## 2015-08-04 NOTE — Patient Outreach (Signed)
[facility]) Care Management  08/04/2015  [name] [birth year] [id number]   Assessment-CSW completed outreach to patient on 08/04/15 to follow up on community resources. Patient answered and stated that she is feeling "okay" today. Patient reports that her appetite has improved over the past few days and that she has reviewed the nutrition tips for elderly packet and felt that information was helpful. Patient reports that she has appointment with PCP on 08/06/15 as her new pain medication has not been effective for her and she wishes to change back to fentanyl patch. Patient reports that she is experiencing some pain in legs. Patient reports that she was able to spend time with her daughter and grand daughter over the holidays. CSW spent time reviewing resources with patient over the phone. Patient received community resource information by mail which included nutrition tips for elderly, Health and safety inspector for seniors and Air traffic controller resources for [employer]. Patient reports that she will contact Housing Repair assistance program when she has the time to see if they would be able to provide assistance to her even if she lives out of the [place] limits. Patient reports that she is unable to make outreaches at this time due to pain. Patient denies any further social work needs at this time. Patient reports that she received a call this morning from Palliative Care and that she scheduled a home visit for 08/11/15. Patient reports confusion on why referral was made and what the benefits of the program would be stating "I'm not sure how it could help me." CSW explained reason for referral and the benefits. Patient is open to learning more about program and their benefits during Palliative care home visit next week. CSW will discharge patient at this time as patient denies needing any further social work assistance.  Plan-CSW has sent in basket message to [facility] and informed  her of patient's desire to gain additional information on Palliative Care services. CSW has informed RNCM of social work discharge.  Eula Fried, BSW, MSW, [email or web address] Phone: [phone] Fax: [phone]

## 2015-08-06 ENCOUNTER — Other Ambulatory Visit: Payer: Self-pay | Admitting: *Deleted

## 2015-08-06 ENCOUNTER — Encounter: Payer: Self-pay | Admitting: Internal Medicine

## 2015-08-06 ENCOUNTER — Ambulatory Visit (INDEPENDENT_AMBULATORY_CARE_PROVIDER_SITE_OTHER): Payer: Medicare Other | Admitting: Internal Medicine

## 2015-08-06 ENCOUNTER — Other Ambulatory Visit: Payer: Self-pay | Admitting: Internal Medicine

## 2015-08-06 VITALS — BP 102/80 | HR 101 | Temp 97.9°F | Resp 18 | Ht 67.0 in | Wt 98.0 lb

## 2015-08-06 DIAGNOSIS — F418 Other specified anxiety disorders: Secondary | ICD-10-CM

## 2015-08-06 DIAGNOSIS — R64 Cachexia: Secondary | ICD-10-CM

## 2015-08-06 DIAGNOSIS — G894 Chronic pain syndrome: Secondary | ICD-10-CM

## 2015-08-06 DIAGNOSIS — Z720 Tobacco use: Secondary | ICD-10-CM

## 2015-08-06 DIAGNOSIS — E538 Deficiency of other specified B group vitamins: Secondary | ICD-10-CM

## 2015-08-06 MED ORDER — CYANOCOBALAMIN 1000 MCG/ML IJ SOLN
1000.0000 ug | Freq: Once | INTRAMUSCULAR | Status: AC
  Administered 2015-08-06: 1000 ug via INTRAMUSCULAR

## 2015-08-06 MED ORDER — MORPHINE SULFATE ER 60 MG PO TBCR: 60.0000 mg | EXTENDED_RELEASE_TABLET | Freq: Two times a day (BID) | ORAL | Status: DC

## 2015-08-06 MED ORDER — NICOTINE 21 MG/24HR TD PT24: 21.0000 mg | MEDICATED_PATCH | Freq: Every day | TRANSDERMAL | Status: DC

## 2015-08-06 MED ORDER — OXYCODONE HCL 5 MG PO CAPS: 5.0000 mg | ORAL_CAPSULE | Freq: Two times a day (BID) | ORAL | Status: DC | PRN

## 2015-08-06 NOTE — Patient Instructions (Addendum)
You can quit the bentyl (dicyclomine) and you can start taking the nexium every other day for 2 weeks then you can stop.  With the gabapentin you can take it once a day for 3 days then stop taking it.   We have given you a new prescription for the morphine and want you to start taking 2 pills twice a day to see if it does better for the pain.   We have refilled the other medicine and given you a B12 shot today.

## 2015-08-06 NOTE — Patient Outreach (Signed)
Final transition of care call:  Spoke with pt who reports went to see MD, told MD  did not want to take so many medications.  Pt reports MD is having her cut  back on 3 medications and Morphine was increased from 30 mg to 60 mg.   Pt reports she does not like the way the Morphine makes her feel, dizzy/lightheaded, wanted to go back on the Fentanyl patch but MD choose to increase the Morphine.  Pt reports her appetite is better, just can't eat large meals, gained 4 lbs.  Pt reports her breathing is good, oxygen level good. Pt reports MD called in a patch to help with smoking cessation, wants to quit.   Pt reports she got a Vitamin B 12 shot today.  RN CM reports on  call from Palliative care, suppose to do a home visit 1/10, does not feel needs their services, doing okay now.  Discussed with pt some of the  benefits of Palliative Care.   Also discussed with pt continuing to follow with community nurse case management services  Short term to which pt did not see a need.  Therefore, RN CM to close case.    Plan to inform Dr. Sharlet Salina via in basket in Epic of case closure.   Plan to send Lattie Haw [facility] care management assistant) via in basket in Epic request to close case.   [name].   [place] Care Management  [phone]

## 2015-08-06 NOTE — Progress Notes (Signed)
Pre visit review using our clinic review tool, if applicable. No additional management support is needed unless otherwise documented below in the visit note. 

## 2015-08-07 ENCOUNTER — Encounter: Payer: Self-pay | Admitting: *Deleted

## 2015-08-07 ENCOUNTER — Other Ambulatory Visit: Payer: Self-pay | Admitting: Geriatric Medicine

## 2015-08-07 ENCOUNTER — Encounter: Payer: Self-pay | Admitting: Internal Medicine

## 2015-08-07 ENCOUNTER — Ambulatory Visit: Payer: Self-pay | Admitting: Internal Medicine

## 2015-08-07 DIAGNOSIS — J449 Chronic obstructive pulmonary disease, unspecified: Secondary | ICD-10-CM | POA: Diagnosis not present

## 2015-08-07 MED ORDER — ALPRAZOLAM 1 MG PO TABS: ORAL_TABLET | ORAL | Status: DC

## 2015-08-07 NOTE — Addendum Note (Signed)
Addended by: Della Goo C on: 08/07/2015 08:55 AM   Modules accepted: Orders

## 2015-08-07 NOTE — Assessment & Plan Note (Signed)
She is still in pain although her back is doing better (likely to more regular bowel movements). Will increase the morphine ER to help more with pain. Her goal is to be in less pain by the summer and more weight so that she is able to go fishing.

## 2015-08-07 NOTE — Assessment & Plan Note (Signed)
Given B12 shot at visit and talked to her about the importance of getting shots regularly or taking daily vitamin B12 pill. She is not able to get to the clinic monthly and will consider B12 between visits.

## 2015-08-07 NOTE — Assessment & Plan Note (Signed)
Gaining some weight and I think changing narcotics is helping some in that regard. Up 6 pounds since last visit and her goal weight is 120 pounds.

## 2015-08-07 NOTE — Progress Notes (Signed)
   Subjective:    Patient ID: [name], female    DOB: November 26, 1960, 55 y.o.   MRN: [id number]  HPI The patient is a 55 YO female coming in for follow up of her chronic pain. We did stop her fentanyl and start her on long acting morphine (covered by her insurance over several other choices we had hoped to start). She has started taking that but does not feel like it is helping much. Still in pain most of the time. She is eating better and overall feels like she is gaining some weight. Some stomach pain still but less back pain for several weeks. She wants to see if we can stop some of her medicines that she is not sure are helping.   Review of Systems  Constitutional: Positive for activity change and fatigue. Negative for fever, appetite change and unexpected weight change.  Respiratory: Positive for shortness of breath. Negative for cough, chest tightness and wheezing.        Stable  Cardiovascular: Negative for chest pain, palpitations and leg swelling.  Gastrointestinal: Positive for abdominal pain and constipation. Negative for nausea, vomiting, diarrhea, blood in stool and abdominal distention.       Improving  Musculoskeletal: Positive for myalgias, back pain and arthralgias. Negative for gait problem.  Neurological: Negative for dizziness, syncope, weakness, light-headedness and headaches.  Psychiatric/Behavioral: Positive for dysphoric mood and decreased concentration. Negative for suicidal ideas, behavioral problems and self-injury. The patient is nervous/anxious.       Objective:   Physical Exam  Constitutional: She is oriented to person, place, and time.  Very thin   HENT:  Head: Normocephalic and atraumatic.  Eyes: EOM are normal.  Neck: Normal range of motion.  Cardiovascular: Normal rate and regular rhythm.   Pulmonary/Chest: Effort normal and breath sounds normal. She has no wheezes. She has no rales.  Abdominal: Soft. Bowel sounds are normal. She exhibits no  distension. There is no tenderness.  Neurological: She is alert and oriented to person, place, and time.  Skin: Skin is warm and dry.   Filed Vitals:   08/06/15 1342  BP: 102/80  Pulse: 101  Temp: 97.9 F (36.6 C)  TempSrc: Oral  Resp: 18  Height: 5\' 7"  (1.702 m)  Weight: 98 lb (44.453 kg)  SpO2: 93%      Assessment & Plan:  B12 shot given at visit.

## 2015-08-07 NOTE — Assessment & Plan Note (Signed)
Talked to her about quitting again today. Since she is trying to gain weight she could spend more money on food and boost if she quit smoking. She is seriously considering cutting back this year as she also has a personal financial goal of getting her boat fixed which could be done if she quit smoking.

## 2015-08-07 NOTE — Assessment & Plan Note (Signed)
Overall she is stable but not well controlled. Using xanax daily still. Would like to see her on an SSRI but her weight is more important at this time. Due to finances and location she is not willing and able to go to counseling. I think she would really benefit from this. She did decline palliative care services on the phone as she did not think they had anything to offer her and did not need them.

## 2015-08-12 ENCOUNTER — Other Ambulatory Visit: Payer: Self-pay | Admitting: Internal Medicine

## 2015-08-18 DIAGNOSIS — R531 Weakness: Secondary | ICD-10-CM | POA: Diagnosis not present

## 2015-09-03 ENCOUNTER — Telehealth: Payer: Self-pay | Admitting: Internal Medicine

## 2015-09-03 NOTE — Telephone Encounter (Signed)
[place] [facility] Call Center Patient Name: [name] DOB: 1961/05/18 Initial Comment Caller states she been constipated and it's been two weeks since she's had a bowl movement until this morning. Strained so bad she has a headache and can't sit. Wants to know if there is something that can be called in. Having bad cramps too. Nurse Assessment Nurse: Martyn Ehrich RN, Felicia Date/Time (Eastern Time): 09/03/2015 3:36:13 PM Confirm and document reason for call. If symptomatic, describe symptoms. You must click the next button to save text entered. ---PT did not have a BM for 2 weeks until this am. 3 am she finally had BM but she had to strain extremely hard and she can hardly sit on her bottom and has a bad HA. Stomach cramps every few seconds. Takes Miralax daily and used a suppository and enema and 2 large pieces came but she feels she is still constipated. No HA now Has the patient traveled out of the country within the last 30 days? ---No Does the patient have any new or worsening symptoms? ---Colbert Ewing a triage be completed? ---Yes Related visit to physician within the last 2 weeks? ---No Does the PT have any chronic conditions? (i.e. diabetes, asthma, etc.) ---Yes List chronic conditions. ---constipation Is this a behavioral health or substance abuse call? ---No Guidelines Guideline Title Affirmed Question Affirmed Notes Abdominal Pain - Female Black or tarry bowel movements (Exception: chronic-unchanged black-grey bowel movements AND is taking iron pills or Pepto-bismol) Final Disposition User Go to ED Now Martyn Ehrich, RN, [facility] - ED Disagree/Comply: Comply Call Id: [phone]

## 2015-09-07 DIAGNOSIS — J449 Chronic obstructive pulmonary disease, unspecified: Secondary | ICD-10-CM | POA: Diagnosis not present

## 2015-09-08 ENCOUNTER — Telehealth: Payer: Self-pay | Admitting: *Deleted

## 2015-09-08 MED ORDER — MORPHINE SULFATE ER 60 MG PO TBCR: 60.0000 mg | EXTENDED_RELEASE_TABLET | Freq: Two times a day (BID) | ORAL | Status: DC

## 2015-09-08 MED ORDER — OXYCODONE HCL 5 MG PO CAPS: 5.0000 mg | ORAL_CAPSULE | Freq: Two times a day (BID) | ORAL | Status: DC | PRN

## 2015-09-08 NOTE — Telephone Encounter (Signed)
Notified pt rx ready for pick-up.../lmb 

## 2015-09-08 NOTE — Telephone Encounter (Signed)
Printed and signed.  

## 2015-09-08 NOTE — Telephone Encounter (Signed)
Left msg on triage wanting to come pick up refills on her Oxycodone & Morphine tomorrow...[name]

## 2015-09-09 ENCOUNTER — Other Ambulatory Visit: Payer: Self-pay | Admitting: Internal Medicine

## 2015-09-09 NOTE — Telephone Encounter (Signed)
Routing to dr crawford----i'm not really seeing recent office visit where this issue was addressed--please advise, thanks

## 2015-09-18 ENCOUNTER — Other Ambulatory Visit: Payer: Self-pay | Admitting: Internal Medicine

## 2015-09-21 NOTE — Telephone Encounter (Signed)
Please advise, thanks.

## 2015-10-05 DIAGNOSIS — J449 Chronic obstructive pulmonary disease, unspecified: Secondary | ICD-10-CM | POA: Diagnosis not present

## 2015-10-06 ENCOUNTER — Telehealth: Payer: Self-pay | Admitting: *Deleted

## 2015-10-06 ENCOUNTER — Ambulatory Visit: Payer: Self-pay | Admitting: Internal Medicine

## 2015-10-06 MED ORDER — MORPHINE SULFATE ER 60 MG PO TBCR: 60.0000 mg | EXTENDED_RELEASE_TABLET | Freq: Two times a day (BID) | ORAL | Status: DC

## 2015-10-06 MED ORDER — OXYCODONE HCL 5 MG PO CAPS: 5.0000 mg | ORAL_CAPSULE | Freq: Two times a day (BID) | ORAL | Status: DC | PRN

## 2015-10-06 NOTE — Telephone Encounter (Signed)
Notified pt rx ready for pick-up.../lmb 

## 2015-10-06 NOTE — Telephone Encounter (Signed)
Received call pt requesting refills on her pain meds Oxycodone & Morphine...[name]

## 2015-10-06 NOTE — Telephone Encounter (Signed)
Printed and signed.  

## 2015-10-11 ENCOUNTER — Other Ambulatory Visit: Payer: Self-pay | Admitting: Internal Medicine

## 2015-10-19 ENCOUNTER — Encounter: Payer: Self-pay | Admitting: Internal Medicine

## 2015-10-19 ENCOUNTER — Ambulatory Visit (INDEPENDENT_AMBULATORY_CARE_PROVIDER_SITE_OTHER): Payer: Medicare Other | Admitting: Internal Medicine

## 2015-10-19 VITALS — BP 102/62 | HR 93 | Temp 98.3°F | Resp 14 | Ht 67.0 in | Wt 96.0 lb

## 2015-10-19 DIAGNOSIS — R64 Cachexia: Secondary | ICD-10-CM | POA: Diagnosis not present

## 2015-10-19 DIAGNOSIS — E538 Deficiency of other specified B group vitamins: Secondary | ICD-10-CM

## 2015-10-19 DIAGNOSIS — Z720 Tobacco use: Secondary | ICD-10-CM

## 2015-10-19 DIAGNOSIS — G894 Chronic pain syndrome: Secondary | ICD-10-CM | POA: Diagnosis not present

## 2015-10-19 DIAGNOSIS — F418 Other specified anxiety disorders: Secondary | ICD-10-CM

## 2015-10-19 DIAGNOSIS — E43 Unspecified severe protein-calorie malnutrition: Secondary | ICD-10-CM

## 2015-10-19 MED ORDER — MORPHINE SULFATE 15 MG PO TABS: 15.0000 mg | ORAL_TABLET | Freq: Two times a day (BID) | ORAL | Status: DC | PRN

## 2015-10-19 MED ORDER — CYANOCOBALAMIN 1000 MCG/ML IJ SOLN
1000.0000 ug | Freq: Once | INTRAMUSCULAR | Status: AC
  Administered 2015-10-19: 1000 ug via INTRAMUSCULAR

## 2015-10-19 NOTE — Assessment & Plan Note (Signed)
Weight is down from last visit which is concerning. She is not eating well and not taking boost daily. Reminded her that she should be drinking 1-2 boost daily regardless of if she eats.

## 2015-10-19 NOTE — Assessment & Plan Note (Signed)
This is definitely worsening her problems and she is not willing to take any meds for this right now. She did well on cymbalta for some time and stopped taking for unknown reason. She was doing better on the cymbalta with mood.

## 2015-10-19 NOTE — Assessment & Plan Note (Signed)
Given shot today at visit and reminded her of the importance of at least taking a multivitamin daily but she is not able at this time. She tries to drink boost when she is able.

## 2015-10-19 NOTE — Progress Notes (Signed)
   Subjective:    Patient ID: [name], female    DOB: 02-Jul-1961, 55 y.o.   MRN: [id number]  HPI The patient is a 55 YO female coming in for follow up of her chronic pain (adjusting to ms contin well and wants to stop her oxycodone as it is not helping, she is cutting the ms contin in half and taking TID which leaves her out early some of the time). She is still having pain most days. Wants to get off some meds. Weight is down 2 pounds and her appetite is poor. Sometimes drinking the boost. Not eating well. Tired most of the time and 50% sleeping well.   Review of Systems  Constitutional: Positive for activity change and fatigue. Negative for fever, appetite change and unexpected weight change.  Respiratory: Positive for shortness of breath. Negative for cough, chest tightness and wheezing.        Better lately.  Cardiovascular: Negative for chest pain, palpitations and leg swelling.  Gastrointestinal: Positive for abdominal pain. Negative for nausea, vomiting, diarrhea, constipation, blood in stool and abdominal distention.       Improving  Musculoskeletal: Positive for myalgias, back pain and arthralgias. Negative for gait problem.  Neurological: Positive for weakness. Negative for dizziness, syncope, light-headedness and headaches.  Psychiatric/Behavioral: Positive for dysphoric mood and decreased concentration. Negative for suicidal ideas, behavioral problems and self-injury.      Objective:   Physical Exam  Constitutional: She is oriented to person, place, and time.  Very thin   HENT:  Head: Normocephalic and atraumatic.  Eyes: EOM are normal.  Neck: Normal range of motion.  Cardiovascular: Normal rate and regular rhythm.   Pulmonary/Chest: Effort normal and breath sounds normal. She has no wheezes. She has no rales.  Abdominal: Soft. Bowel sounds are normal. She exhibits no distension. There is no tenderness.  Neurological: She is alert and oriented to person, place, and  time.  Skin: Skin is warm and dry.   Filed Vitals:   10/19/15 1304  BP: 102/62  Pulse: 93  Temp: 98.3 F (36.8 C)  TempSrc: Oral  Resp: 14  Height: 5\' 7"  (1.702 m)  Weight: 96 lb (43.545 kg)  SpO2: 94%      Assessment & Plan:  B12 shot given at visit.

## 2015-10-19 NOTE — Patient Instructions (Addendum)
We will have you stop the oxycodone and we have given you a morphine that you can take up to twice per day for the pain. Do not split the ms contin as it does not work well when split.   This new medicine is immediate release morphine and you can take it in place of the oxycodone.   We have given you a B12 shot today. Work on eating more as we need you to gain some weight to help with energy and pain.

## 2015-10-19 NOTE — Assessment & Plan Note (Signed)
Stopping her oxycodone and change to MSIR. Continue her MScontin and advised her that it does not work well if you cut it. She will go back to taking this BID and use MSIR BID prn. Given rx today although she got oxycodone IR 10/06/15.

## 2015-10-19 NOTE — Progress Notes (Signed)
Pre visit review using our clinic review tool, if applicable. No additional management support is needed unless otherwise documented below in the visit note. 

## 2015-10-19 NOTE — Assessment & Plan Note (Signed)
Still smoking and we talked about the ongoing damage and harm from smoking. She would like to quit and has tried.

## 2015-10-19 NOTE — Assessment & Plan Note (Signed)
Talked about proper boost usage of 1-2 daily if she can. Suspect that she is not using daily but only when she does not eat at all.

## 2015-10-26 ENCOUNTER — Telehealth: Payer: Self-pay | Admitting: Internal Medicine

## 2015-10-26 NOTE — Telephone Encounter (Signed)
Recommend to go to ER for breathing or agree to see another provider within [place].

## 2015-10-26 NOTE — Telephone Encounter (Signed)
Spoke with patient and she agreed to go to the ER.

## 2015-10-26 NOTE — Telephone Encounter (Signed)
[facility]  Patient Name: [name]  DOB: 06-Apr-1961    Initial Comment Caller states having a hard time breathing and urine is orange    Nurse Assessment  Nurse: Wayne Sever, RN, Tillie Rung Date/Time (Eastern Time): 10/26/2015 10:58:07 AM  Confirm and document reason for call. If symptomatic, describe symptoms. You must click the next button to save text entered. ---Caller states she has been having trouble breathing about 2 or 3 days. She states her pee has been orange for a year. She is having some congestion. Unsure of fever  Has the patient traveled out of the country within the last 30 days? ---Not Applicable  Does the patient have any new or worsening symptoms? ---Yes  Will a triage be completed? ---Yes  Related visit to physician within the last 2 weeks? ---No  Does the PT have any chronic conditions? (i.e. diabetes, asthma, etc.) ---Yes  List chronic conditions. ---Back Issues, Breathing Issues  Is this a behavioral health or substance abuse call? ---No     Guidelines    Guideline Title Affirmed Question Affirmed Notes  Cough - Acute Non-Productive SEVERE coughing spells (e.g., whooping sound after coughing, vomiting after coughing)    Final Disposition User   See Physician within [place], RN, [place] only wanted to see her MD. Told her there was no appointment within 24 hours. She states she will either wait or go to ED.   Referrals  GO TO FACILITY UNDECIDED   Disagree/Comply: Disagree  Disagree/Comply Reason: Disagree with instructions

## 2015-11-02 ENCOUNTER — Other Ambulatory Visit: Payer: Self-pay | Admitting: Internal Medicine

## 2015-11-04 ENCOUNTER — Telehealth: Payer: Self-pay

## 2015-11-04 NOTE — Telephone Encounter (Signed)
Pt called , needs refill of Morphine 15mg  and 60mg  by Friday. Please call when ready and she will pickup.  [phone]

## 2015-11-04 NOTE — Telephone Encounter (Signed)
MD out of office pls advise on refill.../lmb 

## 2015-11-04 NOTE — Telephone Encounter (Signed)
This patient is seen all the time and should be up to date with her appts. Please advise in Dr. Nathanial Millman absence, thanks.

## 2015-11-05 DIAGNOSIS — J449 Chronic obstructive pulmonary disease, unspecified: Secondary | ICD-10-CM | POA: Diagnosis not present

## 2015-11-05 MED ORDER — MORPHINE SULFATE ER 60 MG PO TBCR: 60.0000 mg | EXTENDED_RELEASE_TABLET | Freq: Two times a day (BID) | ORAL | Status: DC

## 2015-11-05 MED ORDER — MORPHINE SULFATE 15 MG PO TABS: 15.0000 mg | ORAL_TABLET | Freq: Two times a day (BID) | ORAL | Status: DC | PRN

## 2015-11-05 NOTE — Telephone Encounter (Signed)
Please call patient as soon as ready for pick up

## 2015-11-05 NOTE — Telephone Encounter (Signed)
Rx's written. 

## 2015-11-05 NOTE — Telephone Encounter (Signed)
Ready for pick up, patient aware. Placed in cabinet up front.

## 2015-11-06 ENCOUNTER — Ambulatory Visit: Payer: Medicare Other | Admitting: Internal Medicine

## 2015-11-06 NOTE — Telephone Encounter (Signed)
Called pharmacy had to leave refill on pharmacy vm.../lmb 

## 2015-11-26 ENCOUNTER — Telehealth: Payer: Self-pay | Admitting: Internal Medicine

## 2015-11-26 NOTE — Telephone Encounter (Signed)
Pt called stated she has been losing weight since last ov, now she is 88lb. Pt was wondering if Dr. Sharlet Salina can recommend what to do or specialist she can see. Please call pt back. Pt stated she can no eat or keep anything down, nausea.

## 2015-11-27 NOTE — Telephone Encounter (Signed)
Please call patient and schedule ov, thanks.

## 2015-11-27 NOTE — Telephone Encounter (Signed)
She would need visit here for evaluation or can go to urgent care if severe.

## 2015-11-27 NOTE — Telephone Encounter (Signed)
Pt stated she might go to Urgent care.

## 2015-12-01 ENCOUNTER — Telehealth: Payer: Self-pay | Admitting: *Deleted

## 2015-12-01 MED ORDER — MORPHINE SULFATE 15 MG PO TABS: 15.0000 mg | ORAL_TABLET | Freq: Two times a day (BID) | ORAL | Status: DC | PRN

## 2015-12-01 MED ORDER — MORPHINE SULFATE ER 60 MG PO TBCR: 60.0000 mg | EXTENDED_RELEASE_TABLET | Freq: Two times a day (BID) | ORAL | Status: DC

## 2015-12-01 NOTE — Telephone Encounter (Signed)
Requesting refills on Morphine 60 mg & Morphine 15 mg...[name]

## 2015-12-01 NOTE — Telephone Encounter (Signed)
Notified pt rx ready for [ick-up...[name]

## 2015-12-01 NOTE — Telephone Encounter (Signed)
Printed and signed.  

## 2015-12-03 ENCOUNTER — Other Ambulatory Visit: Payer: Self-pay | Admitting: Internal Medicine

## 2015-12-05 DIAGNOSIS — J449 Chronic obstructive pulmonary disease, unspecified: Secondary | ICD-10-CM | POA: Diagnosis not present

## 2015-12-31 ENCOUNTER — Telehealth: Payer: Self-pay | Admitting: *Deleted

## 2015-12-31 MED ORDER — MORPHINE SULFATE ER 60 MG PO TBCR
60.0000 mg | EXTENDED_RELEASE_TABLET | Freq: Two times a day (BID) | ORAL | Status: DC
Start: 2015-12-31 — End: 2016-01-21

## 2015-12-31 MED ORDER — MORPHINE SULFATE 15 MG PO TABS: 15.0000 mg | ORAL_TABLET | Freq: Two times a day (BID) | ORAL | Status: DC | PRN

## 2015-12-31 NOTE — Telephone Encounter (Signed)
Printed and signed.  

## 2015-12-31 NOTE — Telephone Encounter (Signed)
Requesting refill on both Morphine rx's...Johny Chess

## 2015-12-31 NOTE — Telephone Encounter (Signed)
Called pt no answer LMOM rx ready for pick-up.../lmb 

## 2016-01-01 ENCOUNTER — Other Ambulatory Visit: Payer: Self-pay | Admitting: Internal Medicine

## 2016-01-04 NOTE — Telephone Encounter (Signed)
Sent to pharmacy 

## 2016-01-05 DIAGNOSIS — J449 Chronic obstructive pulmonary disease, unspecified: Secondary | ICD-10-CM | POA: Diagnosis not present

## 2016-01-21 ENCOUNTER — Ambulatory Visit (INDEPENDENT_AMBULATORY_CARE_PROVIDER_SITE_OTHER): Payer: Medicare Other | Admitting: Internal Medicine

## 2016-01-21 ENCOUNTER — Encounter: Payer: Self-pay | Admitting: Internal Medicine

## 2016-01-21 VITALS — BP 100/78 | HR 101 | Temp 98.4°F | Resp 12 | Ht 67.0 in | Wt 85.0 lb

## 2016-01-21 DIAGNOSIS — E538 Deficiency of other specified B group vitamins: Secondary | ICD-10-CM

## 2016-01-21 DIAGNOSIS — E43 Unspecified severe protein-calorie malnutrition: Secondary | ICD-10-CM

## 2016-01-21 DIAGNOSIS — G894 Chronic pain syndrome: Secondary | ICD-10-CM

## 2016-01-21 DIAGNOSIS — R64 Cachexia: Secondary | ICD-10-CM

## 2016-01-21 MED ORDER — MORPHINE SULFATE ER 60 MG PO TBCR: 60.0000 mg | EXTENDED_RELEASE_TABLET | Freq: Two times a day (BID) | ORAL | Status: DC

## 2016-01-21 MED ORDER — MORPHINE SULFATE 15 MG PO TABS: 15.0000 mg | ORAL_TABLET | Freq: Two times a day (BID) | ORAL | Status: DC | PRN

## 2016-01-21 MED ORDER — KETOROLAC TROMETHAMINE 30 MG/ML IJ SOLN
30.0000 mg | Freq: Once | INTRAMUSCULAR | Status: AC
  Administered 2016-01-21: 30 mg via INTRAMUSCULAR

## 2016-01-21 NOTE — Patient Instructions (Signed)
I am so sorry about [name]. We will be keeping him in our prayers.   We have done your medicine refills.

## 2016-01-21 NOTE — Progress Notes (Signed)
Pre visit review using our clinic review tool, if applicable. No additional management support is needed unless otherwise documented below in the visit note. 

## 2016-01-21 NOTE — Progress Notes (Signed)
   Subjective:    Patient ID: [name], female    DOB: April 27, 1961, 55 y.o.   MRN: [id number]  HPI The patient is a 55 YO female coming in for follow up of her chronic pain. She is still taking her MS contin and morphine MSIR. Still having significant problems with her back pain. She is not gaining weight although she is trying some. Not able to eat a lot with her stomach pain. Her son-in-law is now critically ill and this is weighing on her mind and she is not eating well at all the last 3 days. Breathing is doing okay.   Review of Systems  Constitutional: Positive for activity change, appetite change, fatigue and unexpected weight change. Negative for fever.  Respiratory: Positive for shortness of breath. Negative for cough, chest tightness and wheezing.        Better lately.  Cardiovascular: Negative for chest pain, palpitations and leg swelling.  Gastrointestinal: Positive for abdominal pain. Negative for nausea, vomiting, diarrhea, constipation, blood in stool and abdominal distention.       Improving  Musculoskeletal: Positive for myalgias, back pain and arthralgias. Negative for gait problem.  Neurological: Positive for weakness. Negative for dizziness, syncope, light-headedness and headaches.  Psychiatric/Behavioral: Positive for sleep disturbance, dysphoric mood and decreased concentration. Negative for suicidal ideas, behavioral problems and self-injury.      Objective:   Physical Exam  Constitutional: She is oriented to person, place, and time.  Gaunt  HENT:  Head: Normocephalic and atraumatic.  Eyes: EOM are normal.  Neck: Normal range of motion.  Cardiovascular: Normal rate and regular rhythm.   Pulmonary/Chest: Effort normal and breath sounds normal. She has no wheezes. She has no rales.  Abdominal: Soft. Bowel sounds are normal. She exhibits no distension. There is no tenderness.  Musculoskeletal: She exhibits tenderness.  Diffuse tenderness  Neurological: She is  alert and oriented to person, place, and time.  Skin: Skin is warm and dry.   Filed Vitals:   01/21/16 1303  BP: 100/78  Pulse: 101  Temp: 98.4 F (36.9 C)  TempSrc: Oral  Resp: 12  Height: 5\' 7"  (1.702 m)  Weight: 85 lb (38.556 kg)  SpO2: 95%      Assessment & Plan:  Toradol 30 mg IM given at visit.

## 2016-01-22 NOTE — Assessment & Plan Note (Signed)
Weight is down about 10 pounds and I suspect she is not eating much at all. She is under more stress with critically ill family member the last several days. We talked about the fact that her low energy is from not eating and having no reserves left. We talked about how this could be something that takes her life is she is not able to start eating or using boost or ensure for calories.

## 2016-01-22 NOTE — Assessment & Plan Note (Signed)
Doing well overall with her MSIR and MScontin for pain control in fact better than she has in some time. Given toradol injection today since she has been sleeping on the couch with the family critical illness she is in more pain today.

## 2016-01-22 NOTE — Assessment & Plan Note (Signed)
Severe and suspect she is not eating well and may not have the energy to get food for herself.

## 2016-01-22 NOTE — Assessment & Plan Note (Signed)
Declines feeling improved with replacement and declines B12 shot today although we talked about the fact that she is severely deficient and this could be worsening some of her pain.

## 2016-02-04 DIAGNOSIS — J449 Chronic obstructive pulmonary disease, unspecified: Secondary | ICD-10-CM | POA: Diagnosis not present

## 2016-02-17 ENCOUNTER — Telehealth: Payer: Self-pay | Admitting: *Deleted

## 2016-02-17 MED ORDER — POLYETHYLENE GLYCOL 3350 17 GM/SCOOP PO POWD
ORAL | Status: DC
Start: 2016-02-17 — End: 2016-09-01

## 2016-02-17 NOTE — Telephone Encounter (Signed)
Received fax pt requesting refill on her Miralax. Sent to [facility].../lmb

## 2016-02-29 ENCOUNTER — Telehealth: Payer: Self-pay | Admitting: Emergency Medicine

## 2016-02-29 MED ORDER — MORPHINE SULFATE 15 MG PO TABS: 15.0000 mg | ORAL_TABLET | Freq: Two times a day (BID) | ORAL | 0 refills | Status: DC | PRN

## 2016-02-29 MED ORDER — MORPHINE SULFATE ER 60 MG PO TBCR: 60.0000 mg | EXTENDED_RELEASE_TABLET | Freq: Two times a day (BID) | ORAL | 0 refills | Status: DC

## 2016-02-29 NOTE — Telephone Encounter (Signed)
Please advise, thanks.

## 2016-02-29 NOTE — Telephone Encounter (Signed)
Patient needs a prescription refill on morphine (MS CONTIN) 60 MG 12 hr tablet and morphine (MSIR) 15 MG tablet. Please follow up thanks.

## 2016-02-29 NOTE — Telephone Encounter (Signed)
Called and advised patient that rx is ready for pick up

## 2016-02-29 NOTE — Telephone Encounter (Signed)
Printed and signed.  

## 2016-03-06 DIAGNOSIS — J449 Chronic obstructive pulmonary disease, unspecified: Secondary | ICD-10-CM | POA: Diagnosis not present

## 2016-03-17 ENCOUNTER — Ambulatory Visit: Payer: Medicare Other | Admitting: Internal Medicine

## 2016-04-01 ENCOUNTER — Other Ambulatory Visit: Payer: Self-pay | Admitting: Internal Medicine

## 2016-04-01 ENCOUNTER — Other Ambulatory Visit (INDEPENDENT_AMBULATORY_CARE_PROVIDER_SITE_OTHER): Payer: Medicare Other

## 2016-04-01 ENCOUNTER — Encounter: Payer: Self-pay | Admitting: Internal Medicine

## 2016-04-01 ENCOUNTER — Ambulatory Visit (INDEPENDENT_AMBULATORY_CARE_PROVIDER_SITE_OTHER): Payer: Medicare Other | Admitting: Internal Medicine

## 2016-04-01 VITALS — BP 110/80 | HR 88 | Temp 98.0°F | Resp 18 | Ht 67.0 in | Wt 84.1 lb

## 2016-04-01 DIAGNOSIS — R634 Abnormal weight loss: Secondary | ICD-10-CM

## 2016-04-01 DIAGNOSIS — R64 Cachexia: Secondary | ICD-10-CM | POA: Diagnosis not present

## 2016-04-01 DIAGNOSIS — Z720 Tobacco use: Secondary | ICD-10-CM

## 2016-04-01 DIAGNOSIS — E538 Deficiency of other specified B group vitamins: Secondary | ICD-10-CM | POA: Diagnosis not present

## 2016-04-01 DIAGNOSIS — E43 Unspecified severe protein-calorie malnutrition: Secondary | ICD-10-CM

## 2016-04-01 DIAGNOSIS — G894 Chronic pain syndrome: Secondary | ICD-10-CM

## 2016-04-01 LAB — COMPREHENSIVE METABOLIC PANEL
ALBUMIN: 4.5 g/dL (ref 3.5–5.2)
ALT: 7 U/L (ref 0–35)
AST: 15 U/L (ref 0–37)
Alkaline Phosphatase: 77 U/L (ref 39–117)
BILIRUBIN TOTAL: 0.5 mg/dL (ref 0.2–1.2)
BUN: 16 mg/dL (ref 6–23)
CALCIUM: 9.7 mg/dL (ref 8.4–10.5)
CHLORIDE: 100 meq/L (ref 96–112)
CO2: 33 meq/L — AB (ref 19–32)
CREATININE: 0.68 mg/dL (ref 0.40–1.20)
GFR: 94.72 mL/min (ref >60.00)
Glucose, Bld: 62 mg/dL — ABNORMAL LOW (ref 70–99)
Potassium: 4.9 mEq/L (ref 3.5–5.1)
SODIUM: 138 meq/L (ref 135–145)
Total Protein: 7.5 g/dL (ref 6.0–8.3)

## 2016-04-01 LAB — CBC
HEMATOCRIT: 44.7 % (ref 36.0–46.0)
Hemoglobin: 15.1 g/dL — ABNORMAL HIGH (ref 12.0–15.0)
MCHC: 33.7 g/dL (ref 30.0–36.0)
MCV: 93.6 fl (ref 78.0–100.0)
Platelets: 218 10*3/uL (ref 150.0–400.0)
RBC: 4.78 Mil/uL (ref 3.87–5.11)
RDW: 14.1 % (ref 11.5–15.5)
WBC: 4.6 10*3/uL (ref 4.0–10.5)

## 2016-04-01 LAB — T4, FREE: FREE T4: 0.87 ng/dL (ref 0.60–1.60)

## 2016-04-01 LAB — TSH: TSH: 1.02 u[IU]/mL (ref 0.35–4.50)

## 2016-04-01 MED ORDER — ALPRAZOLAM 1 MG PO TABS
ORAL_TABLET | ORAL | 3 refills | Status: DC
Start: 2016-04-01 — End: 2016-07-20

## 2016-04-01 MED ORDER — MORPHINE SULFATE ER 60 MG PO TBCR: 60.0000 mg | EXTENDED_RELEASE_TABLET | Freq: Two times a day (BID) | ORAL | 0 refills | Status: DC

## 2016-04-01 MED ORDER — MEGESTROL ACETATE 625 MG/5ML PO SUSP: 625.0000 mg | Freq: Every day | ORAL | 0 refills | Status: DC

## 2016-04-01 MED ORDER — MORPHINE SULFATE 15 MG PO TABS: 15.0000 mg | ORAL_TABLET | Freq: Two times a day (BID) | ORAL | 0 refills | Status: DC | PRN

## 2016-04-01 NOTE — Patient Instructions (Addendum)
We have sent in the megace to help with appetite. Take 5 mL daily to see if it helps with appetite.   We have given you a prescription for the boost to see if insurance will pay for it.   We are checking the labs today.

## 2016-04-01 NOTE — Assessment & Plan Note (Signed)
Still smoking and feels she cannot quit.

## 2016-04-01 NOTE — Progress Notes (Signed)
   Subjective:    Patient ID: [name], female    DOB: Aug 12, 1960, 55 y.o.   MRN: [id number]  HPI The patient is a 55 YO female coming in for continued weight loss. She is down another pound since the last visit. She does not have much appetite and is not eating much. She is more stressed out since the loss of her son-in-law this summer as that affects her daughter and granddaughter and this bothers her mood a lot. She has not done any grief counseling or other counseling. She is still having pain but this is reasonably controlled with her current medications. She denies abusing them or using this as she is not allowed. She has questions about THC as a therapy for her appetite stating that she used it back long ago and that she has not used it in some time and it stopped working.   Review of Systems  Constitutional: Positive for activity change, appetite change, fatigue and unexpected weight change. Negative for fever.  Respiratory: Positive for cough and shortness of breath. Negative for chest tightness and wheezing.        Better lately.  Cardiovascular: Negative for chest pain, palpitations and leg swelling.  Gastrointestinal: Positive for abdominal pain. Negative for abdominal distention, blood in stool, constipation, diarrhea, nausea and vomiting.       Improving  Musculoskeletal: Positive for arthralgias, back pain and myalgias. Negative for gait problem.  Neurological: Positive for weakness. Negative for dizziness, syncope, light-headedness and headaches.  Psychiatric/Behavioral: Positive for decreased concentration, dysphoric mood and sleep disturbance. Negative for behavioral problems, self-injury and suicidal ideas. The patient is nervous/anxious.       Objective:   Physical Exam  Constitutional: She is oriented to person, place, and time.  Gaunt  HENT:  Head: Normocephalic and atraumatic.  Eyes: EOM are normal.  Neck: Normal range of motion.  Cardiovascular: Normal rate  and regular rhythm.   Pulmonary/Chest: Effort normal. She has no wheezes. She has no rales.  Abdominal: Soft. Bowel sounds are normal. She exhibits no distension. There is no tenderness.  Musculoskeletal: She exhibits tenderness.  Diffuse tenderness  Neurological: She is alert and oriented to person, place, and time.  Skin: Skin is warm and dry.  Psychiatric:  Crying during the visit and very distraught when talking about her family.    Vitals:   04/01/16 1101  BP: 110/80  Pulse: 88  Resp: 18  Temp: 98 F (36.7 C)  TempSrc: Oral  SpO2: 93%  Weight: 84 lb 1.9 oz (38.2 kg)  Height: 5\' 7"  (1.702 m)      Assessment & Plan:

## 2016-04-01 NOTE — Assessment & Plan Note (Signed)
She declines B12 injection today as they hurt her due to low muscle mass. Not checking levels today as they will be low. This is likely a part of her fatigue.

## 2016-04-01 NOTE — Progress Notes (Signed)
Pre visit review using our clinic review tool, if applicable. No additional management support is needed unless otherwise documented below in the visit note. 

## 2016-04-01 NOTE — Assessment & Plan Note (Signed)
Talked to her about the serious nature of her severe low weight with cardiac and pulmonary failure if she is not able to gain weight.

## 2016-04-01 NOTE — Assessment & Plan Note (Signed)
Rx written for boost today. Rx for megace to try taking for 4 weeks to see if this helps with appetite. IF not we will stop.

## 2016-04-01 NOTE — Assessment & Plan Note (Signed)
Checking labs as none in some time. CT chest and abdomen/pelvis without overt cancer. Referring her to GI today for evaluation of her stomach pain and weight loss.

## 2016-04-01 NOTE — Assessment & Plan Note (Signed)
Refilled her morphine prescriptions today. Slight increase in her xanax for the grief. Encouraged her to seek therapy for help.

## 2016-04-06 DIAGNOSIS — J449 Chronic obstructive pulmonary disease, unspecified: Secondary | ICD-10-CM | POA: Diagnosis not present

## 2016-04-26 ENCOUNTER — Telehealth: Payer: Self-pay | Admitting: *Deleted

## 2016-04-26 MED ORDER — MORPHINE SULFATE 15 MG PO TABS: 15.0000 mg | ORAL_TABLET | Freq: Two times a day (BID) | ORAL | 0 refills | Status: DC | PRN

## 2016-04-26 MED ORDER — MORPHINE SULFATE ER 60 MG PO TBCR: 60.0000 mg | EXTENDED_RELEASE_TABLET | Freq: Two times a day (BID) | ORAL | 0 refills | Status: DC

## 2016-04-26 NOTE — Telephone Encounter (Signed)
Printed and signed.  

## 2016-04-26 NOTE — Telephone Encounter (Signed)
Notified pt rx's ready for pick-up../lmb 

## 2016-04-26 NOTE — Telephone Encounter (Signed)
Rec'd call pt requesting refill on both Morphine...[name]

## 2016-04-27 ENCOUNTER — Telehealth: Payer: Self-pay | Admitting: Internal Medicine

## 2016-04-27 NOTE — Telephone Encounter (Signed)
Pharmacy would not take script for boost b/c she would need feeding tube.  Patient is fearful she will loose a lot of weight and is depressed now.  Suggest maybe getting [facility] involved.  Also, thinks patient would benefit from remeron b/c patient is not sleeping at night and this would also help with depression.

## 2016-04-28 NOTE — Telephone Encounter (Signed)
She has been on remeron before without good results. If the patient wants to try it again that's fine but if the pharmacist is just trying to get this we can talk with patient at next visit. I'm not sure why they would not fill the prescription. Can she try taking it to another pharmacy? We have tried to get [facility] and palliative involved in the past and the patient did not want to have services or talk to them. Is this her request or the pharmacist?

## 2016-04-29 ENCOUNTER — Other Ambulatory Visit: Payer: Self-pay | Admitting: Internal Medicine

## 2016-04-29 NOTE — Telephone Encounter (Signed)
Patient does not want Remeron.

## 2016-05-06 DIAGNOSIS — J449 Chronic obstructive pulmonary disease, unspecified: Secondary | ICD-10-CM | POA: Diagnosis not present

## 2016-05-11 ENCOUNTER — Other Ambulatory Visit: Payer: Self-pay | Admitting: Internal Medicine

## 2016-05-13 ENCOUNTER — Telehealth: Payer: Self-pay | Admitting: Emergency Medicine

## 2016-05-13 NOTE — Telephone Encounter (Signed)
Who are all the she and hers? Is the patient calling or palliative care? I think patient declined [facility] services.

## 2016-05-13 NOTE — Telephone Encounter (Signed)
Palliative care is calling back and wants to know if [facility] got involved? She is stating she would like them to so they can help her financially. Please advise thanks.

## 2016-05-13 NOTE — Telephone Encounter (Signed)
Palliative Care is calling.

## 2016-05-24 ENCOUNTER — Telehealth: Payer: Self-pay | Admitting: *Deleted

## 2016-05-24 NOTE — Telephone Encounter (Signed)
Rec'd call pt is requesting refills on both strength of the Morphine. Inform MD is out of office will be back in tomorrow will hold until she return on 10/25...Johny Chess

## 2016-05-25 MED ORDER — MORPHINE SULFATE 15 MG PO TABS: 15.0000 mg | ORAL_TABLET | Freq: Two times a day (BID) | ORAL | 0 refills | Status: DC | PRN

## 2016-05-25 MED ORDER — MORPHINE SULFATE ER 60 MG PO TBCR: 60.0000 mg | EXTENDED_RELEASE_TABLET | Freq: Two times a day (BID) | ORAL | 0 refills | Status: DC

## 2016-05-25 NOTE — Telephone Encounter (Signed)
Notified pt rx ready for pick-up.../lmb 

## 2016-05-27 ENCOUNTER — Other Ambulatory Visit: Payer: Self-pay | Admitting: Internal Medicine

## 2016-06-03 ENCOUNTER — Encounter: Payer: Self-pay | Admitting: Internal Medicine

## 2016-06-03 ENCOUNTER — Ambulatory Visit (INDEPENDENT_AMBULATORY_CARE_PROVIDER_SITE_OTHER): Payer: Medicare Other | Admitting: Internal Medicine

## 2016-06-03 VITALS — BP 98/64 | HR 124 | Ht 66.5 in | Wt 91.2 lb

## 2016-06-03 DIAGNOSIS — R110 Nausea: Secondary | ICD-10-CM

## 2016-06-03 DIAGNOSIS — R1084 Generalized abdominal pain: Secondary | ICD-10-CM | POA: Diagnosis not present

## 2016-06-03 DIAGNOSIS — K5901 Slow transit constipation: Secondary | ICD-10-CM

## 2016-06-03 MED ORDER — ONDANSETRON HCL 4 MG PO TABS: 4.0000 mg | ORAL_TABLET | ORAL | 0 refills | Status: DC | PRN

## 2016-06-03 NOTE — Progress Notes (Signed)
HISTORY OF PRESENT ILLNESS:  [name] is a 55 y.o. female with multiple significant medical problems including advanced COPD, chronic pain syndrome for which she is on chronic narcotics, arthritis, anxiety/depression, chronic tobacco abuse, remote history of drug abuse, and chronic constipation. Last seen in the office October 2014. See that dictation for details. Sent today by her primary care provider regarding anorexia principally. Occasional abdominal pain as well. Patient reports that she will occasionally develop significant nausea without vomiting. This affects her appetite. She has had fluctuating weight. More recently weight loss. She does report that when she had an sure 3 times daily she gained 12 pounds. This was provided to her by her church. She continues with chronic constipation. When she comes constipated she will develop abdominal pain which is relieved with defecation. When she uses MiraLAX regular, her bowels are improved as is her abdominal pain. No bleeding. She has multiple other complaints which are non-GI including severe back pain, depression, insomnia, and low energy levels.  REVIEW OF SYSTEMS:  All non-GI ROS negative except for anxiety, arthritis, back pain, cough, fatigue, headaches, muscle cramps, sleeping problems, increased urination, shortness of breath, depression  Past Medical History:  Diagnosis Date  . Anxiety   . Arthritis   . [place]    Discovered by pap smear, treated by Dr. Charlesetta Garibaldi with LEEP procedure.  Pap since that time has been normal.  . Chronic back pain     her CT of the head without contrast June 28, 2008- moderate to large right paracentral disc protrusion at C4-C5 resulting in a right C5 neuroforaminal stenosis and probably some degree of spinal stenosis. C. bilateral C6 neuroforaminal stenosis related to  degenerative hypertrophy, no acute fracture or loose pieces identifying the cervical spine, ligamentous injury is not  excluded.    Marland Kitchen COPD (chronic obstructive pulmonary disease) ([place])   . Depression   . Fibromyalgia   . GERD (gastroesophageal reflux disease)  October 2011    [facility], esophagogram - tiny amount of gastroesophageal reflux otherwise unremarkable exam, done by Dr. Dellis Filbert  . Headache(784.0)   . Hemorrhoids   . Osteoporosis    T score L femur neck = -2.6 (02/21/12)    Past Surgical History:  Procedure Laterality Date  . CERVICAL CONE BIOPSY     leep procedure  . MULTIPLE EXTRACTIONS WITH ALVEOLOPLASTY  06/18/2012   Procedure: MULTIPLE EXTRACION WITH ALVEOLOPLASTY;  Surgeon: Gae Bon, DDS;  Location: [facility];  Service: Oral Surgery;  Laterality: Bilateral;    Social History LONNETTE BONADIO  reports that she has been smoking Cigarettes.  She has a 34.00 pack-year smoking history. She has never used smokeless tobacco. She reports that she does not drink alcohol or use drugs.  family history includes Diabetes in her mother; Heart disease in her father; Lung cancer in her daughter; Stroke in her mother.  Allergies  Allergen Reactions  . Aripiprazole Other (See Comments)    Suicidal ideation, also with SSRI  . Chantix [Varenicline] Other (See Comments)    Irritability and depressed mood  . Pregabalin Hives    On trunk of body  . Zolpidem Tartrate Other (See Comments)    Unknown- pt states she does not know about this allergy        PHYSICAL EXAMINATION: Vital signs: BP 98/64 (BP Location: Left Arm, Patient Position: Sitting, Cuff Size: Normal)   Pulse (!) 124   Ht 5' 6.5" (1.689 m) Comment: height measured without shoes  Wt  91 lb 4 oz (41.4 kg)   LMP 09/29/2003   BMI 14.51 kg/m   Constitutional: Thin, chronically ill-appearing, no acute distress Psychiatric: alert and oriented x3, cooperative. Depressed-appearing Eyes: extraocular movements intact, anicteric, conjunctiva pink Mouth: oral pharynx moist, no lesions Neck: supple no lymphadenopathy Cardiovascular: heart  regular rate and rhythm, no murmur Lungs: clear to auscultation bilaterally with distant breath sounds Abdomen: soft, nontender, nondistended, no obvious ascites, no peritoneal signs, normal bowel sounds, no organomegaly Rectal: Omitted Extremities: no clubbing cyanosis or lower extremity edema bilaterally Skin: no lesions on visible extremities Neuro: No focal deficits. Cranial nerves intact  ASSESSMENT:  #1. Chronic intermittent nausea. Likely secondary to chronic narcotic use or other medications #2. Abdominal discomfort related to constipation #3. Fluctuating weight and weight loss. Multifactorial   PLAN:  #1. Prescribe Zofran 4 mg every 4-6 hours as needed for nausea #2. Continue MiraLAX to achieve one or 2 bowel movements daily #3. Try to get additional nutritional supplements as you did previously #4. Return to the care of your primary provider. No further GI workup planned were felt necessary at this time  0.5 minutes was spent face-to-face with the patient. Greater than 50% a time use for counseling helping her understand the nature of her nausea, its treatment, as well as optimal management of abdominal discomfort related to chronic constipation

## 2016-06-06 DIAGNOSIS — J449 Chronic obstructive pulmonary disease, unspecified: Secondary | ICD-10-CM | POA: Diagnosis not present

## 2016-06-08 ENCOUNTER — Encounter: Payer: Self-pay | Admitting: Internal Medicine

## 2016-06-08 ENCOUNTER — Ambulatory Visit (INDEPENDENT_AMBULATORY_CARE_PROVIDER_SITE_OTHER): Payer: Medicare Other | Admitting: Internal Medicine

## 2016-06-08 ENCOUNTER — Other Ambulatory Visit (INDEPENDENT_AMBULATORY_CARE_PROVIDER_SITE_OTHER): Payer: Medicare Other

## 2016-06-08 VITALS — BP 114/78 | HR 121 | Temp 97.6°F | Resp 18 | Ht 67.0 in | Wt 93.0 lb

## 2016-06-08 DIAGNOSIS — Z23 Encounter for immunization: Secondary | ICD-10-CM | POA: Diagnosis not present

## 2016-06-08 DIAGNOSIS — R1013 Epigastric pain: Secondary | ICD-10-CM | POA: Diagnosis not present

## 2016-06-08 DIAGNOSIS — E43 Unspecified severe protein-calorie malnutrition: Secondary | ICD-10-CM | POA: Diagnosis not present

## 2016-06-08 DIAGNOSIS — R110 Nausea: Secondary | ICD-10-CM

## 2016-06-08 LAB — COMPREHENSIVE METABOLIC PANEL
ALK PHOS: 66 U/L (ref 39–117)
ALT: 19 U/L (ref 0–35)
AST: 19 U/L (ref 0–37)
Albumin: 4.5 g/dL (ref 3.5–5.2)
BUN: 15 mg/dL (ref 6–23)
CHLORIDE: 99 meq/L (ref 96–112)
CO2: 33 mEq/L — ABNORMAL HIGH (ref 19–32)
Calcium: 10.3 mg/dL (ref 8.4–10.5)
Creatinine, Ser: 0.83 mg/dL (ref 0.40–1.20)
GFR: 75.21 mL/min (ref >60.00)
GLUCOSE: 101 mg/dL — AB (ref 70–99)
POTASSIUM: 4.8 meq/L (ref 3.5–5.1)
SODIUM: 141 meq/L (ref 135–145)
TOTAL PROTEIN: 8.2 g/dL (ref 6.0–8.3)
Total Bilirubin: 0.6 mg/dL (ref 0.2–1.2)

## 2016-06-08 LAB — T4, FREE: FREE T4: 1.04 ng/dL (ref 0.60–1.60)

## 2016-06-08 LAB — TSH: TSH: 1.95 u[IU]/mL (ref 0.35–4.50)

## 2016-06-08 LAB — VITAMIN B12: Vitamin B-12: 291 pg/mL (ref 211–911)

## 2016-06-08 LAB — LIPASE: Lipase: 2 U/L — ABNORMAL LOW (ref 11.0–59.0)

## 2016-06-08 LAB — CBC
HEMATOCRIT: 44.1 % (ref 36.0–46.0)
HEMOGLOBIN: 14.9 g/dL (ref 12.0–15.0)
MCHC: 33.8 g/dL (ref 30.0–36.0)
MCV: 97 fl (ref 78.0–100.0)
PLATELETS: 417 10*3/uL — AB (ref 150.0–400.0)
RBC: 4.55 Mil/uL (ref 3.87–5.11)
RDW: 15.3 % (ref 11.5–15.5)
WBC: 7.2 10*3/uL (ref 4.0–10.5)

## 2016-06-08 MED ORDER — PROMETHAZINE HCL 25 MG PO TABS: 25.0000 mg | ORAL_TABLET | Freq: Three times a day (TID) | ORAL | 0 refills | Status: DC | PRN

## 2016-06-08 NOTE — Progress Notes (Signed)
   Subjective:    Patient ID: [name], female    DOB: 05/22/1961, 55 y.o.   MRN: [id number]  HPI The patient is a 55 YO female coming in for stomach pain and feeling weak. She saw GI last week and feels that they did not take her complaints seriously. She is not moving her bowels in about 1 week. She has been taking miralax BID and moved some yesterday but not like usual. She is also not eating well. She is still trying to use boost but does not have it often. Denies nausea or vomiting. Is still passing some gas.   Review of Systems  Constitutional: Negative.   Respiratory: Negative.   Cardiovascular: Negative.   Gastrointestinal: Positive for abdominal distention, abdominal pain and constipation. Negative for diarrhea, nausea and vomiting.  Musculoskeletal: Negative.   Skin: Negative.   Neurological: Negative.       Objective:   Physical Exam  Constitutional: She appears well-developed and well-nourished.  HENT:  Head: Normocephalic and atraumatic.  Eyes: EOM are normal.  Cardiovascular: Normal rate and regular rhythm.   Pulmonary/Chest: Effort normal. No respiratory distress. She has no wheezes. She has no rales.  Abdominal: Soft. Bowel sounds are normal. She exhibits distension. There is no tenderness. There is no rebound and no guarding.  Some fullness and epigastric tenderness.    Vitals:   06/08/16 1120  BP: 114/78  Pulse: (!) 121  Resp: 18  Temp: 97.6 F (36.4 C)  TempSrc: Oral  SpO2: 97%  Weight: 93 lb (42.2 kg)  Height: 5\' 7"  (1.702 m)      Assessment & Plan:  Flu shot given at visit.

## 2016-06-08 NOTE — Patient Instructions (Signed)
We are checking the labs today and will call you back with the results.   We have given you the flu shot today.   We have sent in the phenergan.

## 2016-06-08 NOTE — Progress Notes (Signed)
Pre visit review using our clinic review tool, if applicable. No additional management support is needed unless otherwise documented below in the visit note. 

## 2016-06-11 NOTE — Assessment & Plan Note (Signed)
She is taking nexium which helps some. She is advised to continue taking miralax BID to help with the constipation. She has normal BS and no signs of obstruction on exam. Prior imaging normal which is reassuring.

## 2016-06-11 NOTE — Assessment & Plan Note (Signed)
Likely this is contributing to her sluggish bowels as she is not eating enough.

## 2016-06-20 ENCOUNTER — Telehealth: Payer: Self-pay | Admitting: Emergency Medicine

## 2016-06-20 MED ORDER — MORPHINE SULFATE 15 MG PO TABS: 15.0000 mg | ORAL_TABLET | Freq: Two times a day (BID) | ORAL | 0 refills | Status: DC | PRN

## 2016-06-20 MED ORDER — MORPHINE SULFATE ER 60 MG PO TBCR: 60.0000 mg | EXTENDED_RELEASE_TABLET | Freq: Two times a day (BID) | ORAL | 0 refills | Status: DC

## 2016-06-20 NOTE — Telephone Encounter (Signed)
Pt called and needs a prescription refill on morphine (MS CONTIN) 60 MG 12 hr tablet and morphine (MSIR) 15 MG tablet. Please advise thanks.

## 2016-06-20 NOTE — Telephone Encounter (Signed)
Printed and signed.  

## 2016-06-20 NOTE — Telephone Encounter (Signed)
Patient aware. Placed in cabinet up front.  

## 2016-07-01 ENCOUNTER — Ambulatory Visit: Payer: Medicare Other | Admitting: Internal Medicine

## 2016-07-06 DIAGNOSIS — J449 Chronic obstructive pulmonary disease, unspecified: Secondary | ICD-10-CM | POA: Diagnosis not present

## 2016-07-18 ENCOUNTER — Other Ambulatory Visit: Payer: Self-pay | Admitting: Internal Medicine

## 2016-07-20 ENCOUNTER — Other Ambulatory Visit: Payer: Self-pay | Admitting: Internal Medicine

## 2016-07-20 MED ORDER — MORPHINE SULFATE ER 60 MG PO TBCR: 60.0000 mg | EXTENDED_RELEASE_TABLET | Freq: Two times a day (BID) | ORAL | 0 refills | Status: DC

## 2016-07-20 MED ORDER — MORPHINE SULFATE 15 MG PO TABS: 15.0000 mg | ORAL_TABLET | Freq: Two times a day (BID) | ORAL | 0 refills | Status: DC | PRN

## 2016-07-20 NOTE — Telephone Encounter (Signed)
Notified pt rx ready for pick-up.../lmb 

## 2016-07-20 NOTE — Telephone Encounter (Signed)
Rec'd call pt requesting refills on both strength Morphine. MD is out of the office until next week pls advise...[name]

## 2016-07-21 ENCOUNTER — Other Ambulatory Visit: Payer: Self-pay | Admitting: Family

## 2016-07-21 MED ORDER — ALPRAZOLAM 1 MG PO TABS: ORAL_TABLET | ORAL | 0 refills | Status: DC

## 2016-07-21 NOTE — Telephone Encounter (Signed)
Please advise in Dr. Crawford's absence, thanks.  

## 2016-07-21 NOTE — Telephone Encounter (Signed)
Faxed to pharmacy

## 2016-08-06 DIAGNOSIS — J449 Chronic obstructive pulmonary disease, unspecified: Secondary | ICD-10-CM | POA: Diagnosis not present

## 2016-08-17 ENCOUNTER — Ambulatory Visit: Payer: Medicare Other | Admitting: Internal Medicine

## 2016-08-19 ENCOUNTER — Ambulatory Visit: Payer: Medicare Other | Admitting: Internal Medicine

## 2016-08-22 ENCOUNTER — Ambulatory Visit (INDEPENDENT_AMBULATORY_CARE_PROVIDER_SITE_OTHER): Payer: Medicare Other | Admitting: Internal Medicine

## 2016-08-22 ENCOUNTER — Encounter: Payer: Self-pay | Admitting: Internal Medicine

## 2016-08-22 DIAGNOSIS — E43 Unspecified severe protein-calorie malnutrition: Secondary | ICD-10-CM

## 2016-08-22 DIAGNOSIS — G894 Chronic pain syndrome: Secondary | ICD-10-CM | POA: Diagnosis not present

## 2016-08-22 DIAGNOSIS — Z720 Tobacco use: Secondary | ICD-10-CM

## 2016-08-22 DIAGNOSIS — F418 Other specified anxiety disorders: Secondary | ICD-10-CM

## 2016-08-22 MED ORDER — MORPHINE SULFATE 15 MG PO TABS: 15.0000 mg | ORAL_TABLET | Freq: Two times a day (BID) | ORAL | 0 refills | Status: DC | PRN

## 2016-08-22 MED ORDER — ALPRAZOLAM 1 MG PO TABS: 1.0000 mg | ORAL_TABLET | Freq: Four times a day (QID) | ORAL | 0 refills | Status: DC | PRN

## 2016-08-22 MED ORDER — MORPHINE SULFATE ER 60 MG PO TBCR: 60.0000 mg | EXTENDED_RELEASE_TABLET | Freq: Two times a day (BID) | ORAL | 0 refills | Status: DC

## 2016-08-22 MED ORDER — ESOMEPRAZOLE MAGNESIUM 40 MG PO CPDR: 40.0000 mg | DELAYED_RELEASE_CAPSULE | Freq: Two times a day (BID) | ORAL | 1 refills | Status: DC

## 2016-08-22 MED ORDER — ONDANSETRON HCL 4 MG PO TABS: 4.0000 mg | ORAL_TABLET | Freq: Three times a day (TID) | ORAL | 2 refills | Status: DC | PRN

## 2016-08-22 NOTE — Assessment & Plan Note (Signed)
Weight is up some from last time but she is still dramatically underweight. She is working on increasing calorie intake. Prior extensive workup without cause and without malignancy.

## 2016-08-22 NOTE — Assessment & Plan Note (Signed)
Refilled morphine long acting and short acting today. [place] narcotic database reviewed and no abnormal behavior and filling at [address] and no early fills. Pain is adequately controlled with tolerable side effects.

## 2016-08-22 NOTE — Patient Instructions (Signed)
We have given you the refills of the pain medication and the xanax.   We have sent in the zofran as well for nausea that you can take instead of the phenergan.   We have sent in the nexium to take twice a day for the next month.

## 2016-08-22 NOTE — Progress Notes (Signed)
Pre visit review using our clinic review tool, if applicable. No additional management support is needed unless otherwise documented below in the visit note. 

## 2016-08-22 NOTE — Assessment & Plan Note (Signed)
Worsening since last visit. She will increase to xanax QID prn until next visit only. At this time we will reduce back to TID prn only. She has tried and failed many antidepressants and is not willing to see psych and not financially able to do that as well. She does not do counseling. We talked about her finding some volunteer opportunities to help with having something to do with her time.

## 2016-08-22 NOTE — Progress Notes (Signed)
   Subjective:    Patient ID: [name], female    DOB: [birth year], 56 y.o.   MRN: [id number]  HPI The patient is a 56 YO female coming in for worsening anxiety lately with more panic attacks. Since the death of her son-in-law she has been having more attacks. She is also trying to be there for her daughter who is now a single mother. She tried the megace from last visit and did not find it to be helpful with her appetite. She is still eating and trying to drink boost when she can afford it. She is up several pounds since last time which is good.   Review of Systems  Constitutional: Positive for activity change, appetite change and fatigue. Negative for chills, fever and unexpected weight change.  Respiratory: Negative.   Cardiovascular: Negative.   Gastrointestinal: Positive for abdominal pain.  Musculoskeletal: Positive for arthralgias, back pain and myalgias. Negative for gait problem and joint swelling.  Skin: Negative.   Neurological: Negative.   Psychiatric/Behavioral: Positive for decreased concentration and dysphoric mood. Negative for agitation, behavioral problems, self-injury, sleep disturbance and suicidal ideas. The patient is nervous/anxious.       Objective:   Physical Exam  Constitutional: She is oriented to person, place, and time. She appears well-developed and well-nourished.  Thin  HENT:  Head: Normocephalic and atraumatic.  Eyes: EOM are normal.  Neck: Normal range of motion.  Cardiovascular: Normal rate and regular rhythm.   Pulmonary/Chest: Effort normal.  Abdominal: Soft. She exhibits no distension. There is no tenderness.  Neurological: She is alert and oriented to person, place, and time. Coordination abnormal.  Cane for ambulation  Skin: Skin is warm and dry.  Psychiatric:  Some flat affect during the visit    Vitals:   08/22/16 1031  BP: 110/70  Pulse: (!) 101  Resp: 16  Temp: 98 F (36.7 C)  TempSrc: Oral  SpO2: 95%  Weight: 103 lb (46.7  kg)  Height: 5\' 7"  (1.702 m)      Assessment & Plan:

## 2016-08-22 NOTE — Assessment & Plan Note (Signed)
Still smoking and have asked her again to quit but she is not interested although she does have severe health consequences already from smoking.

## 2016-09-01 ENCOUNTER — Other Ambulatory Visit: Payer: Self-pay | Admitting: Internal Medicine

## 2016-09-06 DIAGNOSIS — J449 Chronic obstructive pulmonary disease, unspecified: Secondary | ICD-10-CM | POA: Diagnosis not present

## 2016-09-16 ENCOUNTER — Encounter: Payer: Self-pay | Admitting: Internal Medicine

## 2016-09-16 ENCOUNTER — Ambulatory Visit (INDEPENDENT_AMBULATORY_CARE_PROVIDER_SITE_OTHER): Payer: Medicare Other | Admitting: Internal Medicine

## 2016-09-16 DIAGNOSIS — G894 Chronic pain syndrome: Secondary | ICD-10-CM | POA: Diagnosis not present

## 2016-09-16 DIAGNOSIS — E569 Vitamin deficiency, unspecified: Secondary | ICD-10-CM | POA: Diagnosis not present

## 2016-09-16 DIAGNOSIS — Z720 Tobacco use: Secondary | ICD-10-CM

## 2016-09-16 MED ORDER — ALPRAZOLAM 1 MG PO TABS: 1.0000 mg | ORAL_TABLET | Freq: Four times a day (QID) | ORAL | 0 refills | Status: DC | PRN

## 2016-09-16 MED ORDER — MORPHINE SULFATE 15 MG PO TABS: 15.0000 mg | ORAL_TABLET | Freq: Two times a day (BID) | ORAL | 0 refills | Status: DC | PRN

## 2016-09-16 MED ORDER — MORPHINE SULFATE ER 100 MG PO TBCR: 100.0000 mg | EXTENDED_RELEASE_TABLET | Freq: Two times a day (BID) | ORAL | 0 refills | Status: DC

## 2016-09-16 NOTE — Patient Instructions (Signed)
We have gone up on the morphine medicine that you take twice a day to see if this helps more.

## 2016-09-16 NOTE — Assessment & Plan Note (Signed)
Still smoking and does adversely affect her QOL. She is not able to make an attempt to quit at this time.

## 2016-09-16 NOTE — Progress Notes (Signed)
Pre visit review using our clinic review tool, if applicable. No additional management support is needed unless otherwise documented below in the visit note. 

## 2016-09-16 NOTE — Assessment & Plan Note (Signed)
She is not doing B12 shots but has been instructed to take multivitamin. Last B12 still low but in the normal range.

## 2016-09-16 NOTE — Progress Notes (Signed)
   Subjective:    Patient ID: [name], female    DOB: 02-27-1961, 56 y.o.   MRN: [id number]  HPI The patient is a 57 YO female coming in for worsening of her chronic pain. She has been feeling more aching and stiffness in the last 2-3 weeks. This is limiting her ROM and activity. She is not able to get around the house. Her goals are to be more mobile to get out on her boat this summer. She denies injuries and she is taking her medications appropriately. She is not eating as well lately due to lack of mobility.   Review of Systems  Constitutional: Positive for activity change, appetite change and fatigue. Negative for chills, fever and unexpected weight change.  Respiratory: Negative.   Cardiovascular: Negative.   Gastrointestinal: Negative.   Musculoskeletal: Positive for arthralgias, back pain, gait problem and myalgias. Negative for joint swelling, neck pain and neck stiffness.  Skin: Negative.   Neurological: Positive for weakness and numbness. Negative for dizziness, light-headedness and headaches.      Objective:   Physical Exam  Constitutional: She is oriented to person, place, and time. She appears well-developed and well-nourished.  HENT:  Head: Normocephalic and atraumatic.  Eyes: EOM are normal.  Neck: Normal range of motion.  Cardiovascular: Normal rate and regular rhythm.   Pulmonary/Chest: Effort normal.  Abdominal: Soft. She exhibits no distension. There is no tenderness.  Musculoskeletal: She exhibits tenderness.  Neurological: She is alert and oriented to person, place, and time. Coordination normal.  Skin: Skin is warm.   Vitals:   09/16/16 1118  BP: 102/60  Pulse: 92  Temp: 98.1 F (36.7 C)  TempSrc: Oral  SpO2: 98%  Weight: 101 lb (45.8 kg)  Height: 5\' 7"  (1.702 m)      Assessment & Plan:

## 2016-09-16 NOTE — Assessment & Plan Note (Signed)
Will increase morphine to 100 mg BID long acting and keep her short acting the same to see if this helps with her mobility and function. If not then we can decrease back to her prior level. Refilled her morphine today and new rx for ms contin 100 mg BID.

## 2016-09-19 ENCOUNTER — Ambulatory Visit: Payer: Medicare Other | Admitting: Internal Medicine

## 2016-10-04 DIAGNOSIS — J449 Chronic obstructive pulmonary disease, unspecified: Secondary | ICD-10-CM | POA: Diagnosis not present

## 2016-10-07 ENCOUNTER — Ambulatory Visit (INDEPENDENT_AMBULATORY_CARE_PROVIDER_SITE_OTHER): Payer: Self-pay | Admitting: Specialist

## 2016-10-07 ENCOUNTER — Other Ambulatory Visit: Payer: Self-pay | Admitting: Internal Medicine

## 2016-10-10 ENCOUNTER — Telehealth: Payer: Self-pay | Admitting: Internal Medicine

## 2016-10-10 MED ORDER — MORPHINE SULFATE 15 MG PO TABS: 15.0000 mg | ORAL_TABLET | Freq: Two times a day (BID) | ORAL | 0 refills | Status: DC | PRN

## 2016-10-10 MED ORDER — MORPHINE SULFATE ER 100 MG PO TBCR: 100.0000 mg | EXTENDED_RELEASE_TABLET | Freq: Two times a day (BID) | ORAL | 0 refills | Status: DC

## 2016-10-10 MED ORDER — ALPRAZOLAM 1 MG PO TABS: 1.0000 mg | ORAL_TABLET | Freq: Four times a day (QID) | ORAL | 1 refills | Status: DC | PRN

## 2016-10-10 NOTE — Telephone Encounter (Signed)
Pt request refill for Xanax, morphine 15 and morphine 100. Please call  [phone]

## 2016-11-04 DIAGNOSIS — J449 Chronic obstructive pulmonary disease, unspecified: Secondary | ICD-10-CM | POA: Diagnosis not present

## 2016-11-09 ENCOUNTER — Telehealth: Payer: Self-pay | Admitting: *Deleted

## 2016-11-09 NOTE — Telephone Encounter (Signed)
Rec'd call pt requesting refills on both strength of morphine & xanax...[name]

## 2016-11-10 MED ORDER — MORPHINE SULFATE 15 MG PO TABS: 15.0000 mg | ORAL_TABLET | Freq: Two times a day (BID) | ORAL | 0 refills | Status: DC | PRN

## 2016-11-10 MED ORDER — MORPHINE SULFATE ER 100 MG PO TBCR: 100.0000 mg | EXTENDED_RELEASE_TABLET | Freq: Two times a day (BID) | ORAL | 0 refills | Status: DC

## 2016-11-10 NOTE — Telephone Encounter (Signed)
Notified pt w/MD response. Place scripts up front for pick-up...[name]

## 2016-11-10 NOTE — Telephone Encounter (Signed)
Morphine printed and signed, xanax not due for refill.

## 2016-11-17 ENCOUNTER — Other Ambulatory Visit: Payer: Self-pay | Admitting: Internal Medicine

## 2016-12-04 DIAGNOSIS — J449 Chronic obstructive pulmonary disease, unspecified: Secondary | ICD-10-CM | POA: Diagnosis not present

## 2016-12-09 ENCOUNTER — Telehealth: Payer: Self-pay | Admitting: *Deleted

## 2016-12-09 ENCOUNTER — Other Ambulatory Visit: Payer: Self-pay | Admitting: Internal Medicine

## 2016-12-09 MED ORDER — MORPHINE SULFATE 15 MG PO TABS: 15.0000 mg | ORAL_TABLET | Freq: Two times a day (BID) | ORAL | 0 refills | Status: DC | PRN

## 2016-12-09 MED ORDER — MORPHINE SULFATE ER 100 MG PO TBCR: 100.0000 mg | EXTENDED_RELEASE_TABLET | Freq: Two times a day (BID) | ORAL | 0 refills | Status: DC

## 2016-12-09 NOTE — Telephone Encounter (Signed)
Printed and signed.  

## 2016-12-09 NOTE — Telephone Encounter (Signed)
Rec'd call pt requesting refill on her Morphine scripts...[name]

## 2016-12-09 NOTE — Telephone Encounter (Signed)
please advise.

## 2016-12-12 NOTE — Telephone Encounter (Signed)
Notified pt rx ready for pick-up.../lmb 

## 2016-12-15 ENCOUNTER — Other Ambulatory Visit: Payer: Self-pay | Admitting: Internal Medicine

## 2016-12-23 ENCOUNTER — Telehealth: Payer: Self-pay | Admitting: Internal Medicine

## 2016-12-23 NOTE — Telephone Encounter (Signed)
Pt states promethazine (PHENERGAN) 25 MG tablet Is not working she is still very nauseous and cannot eat or drink her ensure. She is very weak and cannot get off the couch. Would like to know what else she can try.

## 2016-12-27 NOTE — Telephone Encounter (Signed)
Tried calling patient, no answer. Will call again later

## 2017-01-01 ENCOUNTER — Other Ambulatory Visit: Payer: Self-pay | Admitting: Internal Medicine

## 2017-01-04 DIAGNOSIS — J449 Chronic obstructive pulmonary disease, unspecified: Secondary | ICD-10-CM | POA: Diagnosis not present

## 2017-01-09 ENCOUNTER — Telehealth: Payer: Self-pay | Admitting: *Deleted

## 2017-01-09 MED ORDER — MORPHINE SULFATE 15 MG PO TABS
15.0000 mg | ORAL_TABLET | Freq: Two times a day (BID) | ORAL | 0 refills | Status: DC | PRN
Start: 2017-01-09 — End: 2017-03-10

## 2017-01-09 MED ORDER — MORPHINE SULFATE ER 100 MG PO TBCR: 100.0000 mg | EXTENDED_RELEASE_TABLET | Freq: Two times a day (BID) | ORAL | 0 refills | Status: DC

## 2017-01-09 NOTE — Telephone Encounter (Signed)
Notified pt rx ready for pick-up.../lmb 

## 2017-01-09 NOTE — Telephone Encounter (Signed)
Pt left msg on triage requesting refills on both of her morphine scripts.Will send to [name] since Md is out of office....[name]

## 2017-01-09 NOTE — Telephone Encounter (Signed)
Medications dated per last refill and available for pick up.

## 2017-01-29 ENCOUNTER — Other Ambulatory Visit: Payer: Self-pay | Admitting: Internal Medicine

## 2017-02-03 DIAGNOSIS — J449 Chronic obstructive pulmonary disease, unspecified: Secondary | ICD-10-CM | POA: Diagnosis not present

## 2017-02-07 ENCOUNTER — Telehealth: Payer: Self-pay | Admitting: Internal Medicine

## 2017-02-07 MED ORDER — ALPRAZOLAM 1 MG PO TABS: ORAL_TABLET | ORAL | 0 refills | Status: DC

## 2017-02-07 MED ORDER — MORPHINE SULFATE ER 100 MG PO TBCR: 100.0000 mg | EXTENDED_RELEASE_TABLET | Freq: Two times a day (BID) | ORAL | 0 refills | Status: DC

## 2017-02-07 NOTE — Telephone Encounter (Signed)
Medications have been dated for refill. [place] Controlled Substance Database reviewed with no irregularities.

## 2017-02-07 NOTE — Telephone Encounter (Signed)
Please advise  Morphine 15mg  tab last dispensed 01/31/2017 60 tabs for 30 days Xanax 1mg  tab last dispensed 01/14/2017 120 tabs for 30 days  Morphine ER100mg  last dispensed 01/02/2017 60 tabs for 30 days

## 2017-02-07 NOTE — Telephone Encounter (Signed)
Pt needs a refill of  morphine (MSIR) 15 MG tablet  morphine (MS CONTIN) 100 MG 12 hr tablet  ALPRAZolam (XANAX) 1 MG tablet

## 2017-02-09 NOTE — Telephone Encounter (Signed)
Please send morphine (MSIR) 15 MG tablet  [facility]

## 2017-02-09 NOTE — Telephone Encounter (Signed)
Patient aware.

## 2017-02-09 NOTE — Telephone Encounter (Signed)
Please advise 

## 2017-02-09 NOTE — Telephone Encounter (Signed)
Recently filled on 01/31/17 - not due.

## 2017-02-23 DIAGNOSIS — H04123 Dry eye syndrome of bilateral lacrimal glands: Secondary | ICD-10-CM | POA: Diagnosis not present

## 2017-02-23 DIAGNOSIS — H353131 Nonexudative age-related macular degeneration, bilateral, early dry stage: Secondary | ICD-10-CM | POA: Diagnosis not present

## 2017-02-23 DIAGNOSIS — Z961 Presence of intraocular lens: Secondary | ICD-10-CM | POA: Diagnosis not present

## 2017-03-06 DIAGNOSIS — J449 Chronic obstructive pulmonary disease, unspecified: Secondary | ICD-10-CM | POA: Diagnosis not present

## 2017-03-07 ENCOUNTER — Other Ambulatory Visit: Payer: Self-pay | Admitting: Internal Medicine

## 2017-03-07 NOTE — Telephone Encounter (Signed)
Routing to [employer], are you ok with refilling until dr crawford returns, or do you need office visit---please advise, thanks

## 2017-03-09 ENCOUNTER — Telehealth: Payer: Self-pay | Admitting: Internal Medicine

## 2017-03-09 NOTE — Telephone Encounter (Signed)
morphine (MS CONTIN) 100 MG 12 hr tablet  morphine (MSIR) 15 MG tablet    Patient is requesting a refill on these medication. Please advise. Thank you.

## 2017-03-09 NOTE — Telephone Encounter (Signed)
Check [place] registry last filled 02/12/2017. Last UDS 08/25/14...Johny Chess

## 2017-03-10 MED ORDER — MORPHINE SULFATE ER 100 MG PO TBCR: 100.0000 mg | EXTENDED_RELEASE_TABLET | Freq: Two times a day (BID) | ORAL | 0 refills | Status: DC

## 2017-03-10 MED ORDER — MORPHINE SULFATE 15 MG PO TABS: 15.0000 mg | ORAL_TABLET | Freq: Two times a day (BID) | ORAL | 0 refills | Status: DC | PRN

## 2017-03-10 NOTE — Telephone Encounter (Signed)
Needs UDS at pick up.

## 2017-03-13 ENCOUNTER — Telehealth: Payer: Self-pay | Admitting: Internal Medicine

## 2017-03-13 DIAGNOSIS — Z79891 Long term (current) use of opiate analgesic: Secondary | ICD-10-CM | POA: Diagnosis not present

## 2017-03-13 DIAGNOSIS — Z79899 Other long term (current) drug therapy: Secondary | ICD-10-CM | POA: Diagnosis not present

## 2017-03-13 MED ORDER — ALPRAZOLAM 1 MG PO TABS: ORAL_TABLET | ORAL | 0 refills | Status: DC

## 2017-03-13 NOTE — Telephone Encounter (Signed)
Medication refilled - it appears as it was previously filled early by pharmacy.

## 2017-03-13 NOTE — Telephone Encounter (Signed)
Tried calling pt to inform rx ready for pick-up no answer & can't leave msg due to vm not being set-up. Will call back later.Marland KitchenJohny Kaufman

## 2017-03-13 NOTE — Telephone Encounter (Signed)
Pt called for a refill of her ALPRAZolam Duanne Moron) 1 MG tablet  Had med refill 08-22-2016 Please advise

## 2017-03-13 NOTE — Telephone Encounter (Signed)
Check [place] registry [facility] pls advise...[name]

## 2017-03-13 NOTE — Telephone Encounter (Signed)
Pt called checking on these refills. I gave her your response and let her know that she will need a UDS when she comes to pick them up.

## 2017-03-13 NOTE — Telephone Encounter (Signed)
[facility].../lmb 

## 2017-03-13 NOTE — Telephone Encounter (Signed)
Place rx's up front for pick-up...[name]

## 2017-03-21 ENCOUNTER — Other Ambulatory Visit: Payer: Self-pay | Admitting: Internal Medicine

## 2017-04-06 ENCOUNTER — Ambulatory Visit (INDEPENDENT_AMBULATORY_CARE_PROVIDER_SITE_OTHER): Payer: Medicare Other | Admitting: Internal Medicine

## 2017-04-06 ENCOUNTER — Encounter: Payer: Self-pay | Admitting: Internal Medicine

## 2017-04-06 DIAGNOSIS — E43 Unspecified severe protein-calorie malnutrition: Secondary | ICD-10-CM | POA: Diagnosis not present

## 2017-04-06 DIAGNOSIS — F418 Other specified anxiety disorders: Secondary | ICD-10-CM

## 2017-04-06 DIAGNOSIS — J449 Chronic obstructive pulmonary disease, unspecified: Secondary | ICD-10-CM | POA: Diagnosis not present

## 2017-04-06 DIAGNOSIS — G894 Chronic pain syndrome: Secondary | ICD-10-CM | POA: Diagnosis not present

## 2017-04-06 DIAGNOSIS — R64 Cachexia: Secondary | ICD-10-CM | POA: Diagnosis not present

## 2017-04-06 MED ORDER — MORPHINE SULFATE ER 100 MG PO TBCR: 100.0000 mg | EXTENDED_RELEASE_TABLET | Freq: Two times a day (BID) | ORAL | 0 refills | Status: DC

## 2017-04-06 MED ORDER — MORPHINE SULFATE 15 MG PO TABS: 15.0000 mg | ORAL_TABLET | Freq: Two times a day (BID) | ORAL | 0 refills | Status: DC | PRN

## 2017-04-06 MED ORDER — ALPRAZOLAM 1 MG PO TABS: ORAL_TABLET | ORAL | 3 refills | Status: DC

## 2017-04-06 NOTE — Patient Instructions (Signed)
We have sent in remeron which is a once a day medicine to help with the anxiety and depression.

## 2017-04-09 NOTE — Assessment & Plan Note (Signed)
Sending in remeron to help with appetite and her uncontrolled depression and anxiety.

## 2017-04-09 NOTE — Progress Notes (Signed)
   Subjective:    Patient ID: [name], female    DOB: 1960-11-21, 56 y.o.   MRN: [id number]  HPI The patient is a 56 YO female coming in for follow up of her cachexia and weight loss. She is trying to eat some since last visit, but her weight is down some. She is having some stomach problems. The megace has not helped much with appetite. She is not sure that the pain medication is helping her as well as it was. She is still taking the morphine regularly and using the breakthrough. No change in her breathing and no new sputum.   Review of Systems  Constitutional: Positive for activity change, appetite change, fatigue and unexpected weight change. Negative for chills and fever.  HENT: Negative.   Eyes: Negative.   Respiratory: Positive for cough and shortness of breath. Negative for chest tightness and wheezing.   Cardiovascular: Negative.   Gastrointestinal: Positive for abdominal pain. Negative for abdominal distention, anal bleeding and blood in stool.  Musculoskeletal: Positive for arthralgias and back pain. Negative for gait problem, myalgias and neck pain.  Skin: Negative.   Neurological: Positive for weakness. Negative for dizziness, facial asymmetry and numbness.      Objective:   Physical Exam  Constitutional: She is oriented to person, place, and time. She appears well-developed.  Gaunt in appearance  HENT:  Head: Normocephalic and atraumatic.  Eyes: EOM are normal.  Neck: Normal range of motion.  Cardiovascular: Normal rate and regular rhythm.   Pulmonary/Chest: Effort normal. No respiratory distress. She has no wheezes.  Musculoskeletal: She exhibits no edema.  Neurological: She is alert and oriented to person, place, and time. Coordination abnormal.  Cane for ambulation   Vitals:   04/06/17 1544  BP: 110/80  Pulse: (!) 116  Temp: 97.6 F (36.4 C)  TempSrc: Oral  SpO2: 96%  Weight: 98 lb (44.5 kg)  Height: 5\' 7"  (1.702 m)      Assessment & Plan:

## 2017-04-09 NOTE — Assessment & Plan Note (Signed)
Will keep regimen the same although we have talked before about possibility of her pain regimen affecting her abdominal pain.

## 2017-04-09 NOTE — Assessment & Plan Note (Signed)
She is down about 4 pounds since last visit and she is having more pain. She does not have any new localizing symptoms and has had extensive workup for malignancy without anything found. Will have her resume protonix daily to see if this helps and encouraged her to increase calories. She declines nutrition consult.

## 2017-04-09 NOTE — Assessment & Plan Note (Signed)
Worsened since last visit. Megace has not helped with weight gain although her appetite is about the same since last visit. She is severely undernourished and we discussed how this is affecting her health and energy.

## 2017-04-14 ENCOUNTER — Other Ambulatory Visit: Payer: Self-pay | Admitting: Internal Medicine

## 2017-05-06 DIAGNOSIS — J449 Chronic obstructive pulmonary disease, unspecified: Secondary | ICD-10-CM | POA: Diagnosis not present

## 2017-05-08 ENCOUNTER — Telehealth: Payer: Self-pay | Admitting: *Deleted

## 2017-05-08 MED ORDER — MORPHINE SULFATE 15 MG PO TABS: 15.0000 mg | ORAL_TABLET | Freq: Two times a day (BID) | ORAL | 0 refills | Status: DC | PRN

## 2017-05-08 MED ORDER — MORPHINE SULFATE ER 100 MG PO TBCR: 100.0000 mg | EXTENDED_RELEASE_TABLET | Freq: Two times a day (BID) | ORAL | 0 refills | Status: DC

## 2017-05-08 NOTE — Telephone Encounter (Signed)
Ready for pickup. Please UDS

## 2017-05-08 NOTE — Telephone Encounter (Signed)
Pt called regarding this would like to come later this week to pick up

## 2017-05-08 NOTE — Telephone Encounter (Signed)
Received call pt requesting refills on both MS Contin. Check [place] registry both last filled 04/12/2017...[name]

## 2017-05-09 ENCOUNTER — Other Ambulatory Visit: Payer: Self-pay

## 2017-05-09 ENCOUNTER — Other Ambulatory Visit: Payer: Medicare Other

## 2017-05-09 DIAGNOSIS — F1120 Opioid dependence, uncomplicated: Secondary | ICD-10-CM

## 2017-05-09 NOTE — Telephone Encounter (Signed)
Notified pt rx's ready for pick-up../lmb 

## 2017-05-14 LAB — PAIN MGMT, PROFILE 8 W/CONF, U
6 ACETYLMORPHINE: NEGATIVE ng/mL (ref <10)
6 Acetylmorphine: NEGATIVE ng/mL (ref <10)
ALCOHOL METABOLITES: NEGATIVE ng/mL (ref <500)
AMINOCLONAZEPAM: NEGATIVE ng/mL (ref <25)
Alphahydroxyalprazolam: >1000 ng/mL — ABNORMAL HIGH (ref <25)
Alphahydroxymidazolam: NEGATIVE ng/mL (ref <50)
Alphahydroxytriazolam: NEGATIVE ng/mL (ref <50)
Amphetamines: NEGATIVE ng/mL (ref <500)
BUPRENORPHINE, URINE: NEGATIVE ng/mL (ref <5)
BUPRENORPHINE: NEGATIVE ng/mL (ref <2)
Benzodiazepines: POSITIVE ng/mL — AB (ref <100)
Cocaine Metabolite: NEGATIVE ng/mL (ref <150)
Codeine: NEGATIVE ng/mL (ref <50)
Creatinine: 133.1 mg/dL
HYDROCODONE: NEGATIVE ng/mL (ref <50)
Hydromorphone: 1871 ng/mL — ABNORMAL HIGH (ref <50)
Hydroxyethylflurazepam: NEGATIVE ng/mL (ref <50)
LORAZEPAM: NEGATIVE ng/mL (ref <50)
MARIJUANA METABOLITE: 325 ng/mL — AB (ref <5)
MDMA: NEGATIVE ng/mL (ref <500)
Marijuana Metabolite: POSITIVE ng/mL — AB (ref <20)
NORBUPRENORPHINE: NEGATIVE ng/mL (ref <2)
Nordiazepam: NEGATIVE ng/mL (ref <50)
Norhydrocodone: NEGATIVE ng/mL (ref <50)
Noroxycodone: NEGATIVE ng/mL (ref <50)
OPIATES: POSITIVE ng/mL — AB (ref <100)
OXYCODONE: NEGATIVE ng/mL (ref <100)
OXYCODONE: NEGATIVE ng/mL (ref <50)
Oxazepam: NEGATIVE ng/mL (ref <50)
Oxidant: NEGATIVE ug/mL (ref <200)
Oxymorphone: NEGATIVE ng/mL (ref <50)
TEMAZEPAM: NEGATIVE ng/mL (ref <50)
pH: 7.58 (ref 4.5–9.0)

## 2017-06-06 DIAGNOSIS — J449 Chronic obstructive pulmonary disease, unspecified: Secondary | ICD-10-CM | POA: Diagnosis not present

## 2017-06-07 ENCOUNTER — Encounter: Payer: Self-pay | Admitting: Internal Medicine

## 2017-06-07 ENCOUNTER — Ambulatory Visit (INDEPENDENT_AMBULATORY_CARE_PROVIDER_SITE_OTHER): Payer: Medicare Other | Admitting: Internal Medicine

## 2017-06-07 VITALS — BP 100/76 | HR 104 | Temp 98.5°F | Ht 67.0 in | Wt 102.0 lb

## 2017-06-07 DIAGNOSIS — J449 Chronic obstructive pulmonary disease, unspecified: Secondary | ICD-10-CM

## 2017-06-07 DIAGNOSIS — J441 Chronic obstructive pulmonary disease with (acute) exacerbation: Secondary | ICD-10-CM | POA: Diagnosis not present

## 2017-06-07 DIAGNOSIS — Z720 Tobacco use: Secondary | ICD-10-CM | POA: Diagnosis not present

## 2017-06-07 MED ORDER — PREDNISONE 20 MG PO TABS: 40.0000 mg | ORAL_TABLET | Freq: Every day | ORAL | 0 refills | Status: DC

## 2017-06-07 MED ORDER — GUAIFENESIN 200 MG PO TABS: 200.0000 mg | ORAL_TABLET | Freq: Three times a day (TID) | ORAL | 0 refills | Status: DC | PRN

## 2017-06-07 MED ORDER — MORPHINE SULFATE ER 100 MG PO TBCR: 100.0000 mg | EXTENDED_RELEASE_TABLET | Freq: Two times a day (BID) | ORAL | 0 refills | Status: DC

## 2017-06-07 MED ORDER — MORPHINE SULFATE 15 MG PO TABS: 15.0000 mg | ORAL_TABLET | Freq: Two times a day (BID) | ORAL | 0 refills | Status: DC | PRN

## 2017-06-07 NOTE — Progress Notes (Signed)
   Subjective:    Patient ID: [name], female    DOB: 25-Jun-1961, 56 y.o.   MRN: [id number]  HPI The patient is a 56 YO female coming in for bad cough and SOB. 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 She is having more cough but about the same sputum. More SOB. She does use her inhalers as they help some. She would like a referral to pulmonary to see if there is anything else she can do. This is limiting her life to the point where she is not able to do much of anything.   Review of Systems  Constitutional: Positive for activity change, appetite change and chills. Negative for fatigue, fever and unexpected weight change.  HENT: Positive for congestion and postnasal drip. Negative for ear discharge, ear pain, rhinorrhea, sinus pressure, sinus pain, sneezing, sore throat, tinnitus, trouble swallowing and voice change.   Eyes: Negative.   Respiratory: Positive for cough and shortness of breath. Negative for chest tightness and wheezing.   Cardiovascular: Negative.   Gastrointestinal: Negative.   Musculoskeletal: Positive for arthralgias, back pain and myalgias.  Neurological: Negative.       Objective:   Physical Exam  Constitutional: She is oriented to person, place, and time. She appears well-developed and well-nourished.  gaunt  HENT:  Head: Normocephalic and atraumatic.  Eyes: EOM are normal.  Neck: Normal range of motion. No thyromegaly present.  Cardiovascular: Normal rate and regular rhythm.  Pulmonary/Chest: Effort normal. No respiratory distress. She has wheezes. She has no rales.  Lung exam similar to prior with severe wheezing and some rhonchi  Abdominal: Soft.  Lymphadenopathy:    She has no cervical adenopathy.  Neurological: She is alert and oriented to person, place, and time.  Skin: Skin is warm and dry.   Vitals:   06/07/17 1102  BP: 100/76  Pulse: (!) 104  Temp: 98.5 F (36.9 C)  TempSrc: Oral  SpO2: 93%    Weight: 102 lb (46.3 kg)  Height: 5\' 7"  (1.702 m)      Assessment & Plan:

## 2017-06-07 NOTE — Patient Instructions (Signed)
We have sent in prednisone to take 2 pills daily for 5 days.  We have also sent in mucinex to use twice a day to help the stuff be easier to cough up. The smoking is causing this to be thick and hard to cough up so stopping smoking is the best thing to do to help your health.  We have refilled the pain medicine today but you will need a visit before any more refilled can be done due to some changes in our policies.

## 2017-06-09 NOTE — Assessment & Plan Note (Signed)
With exacerbation today, rx for prednisone. Referral to pulmonary although we discussed that her breathing is not likely to improve while she is still smoking and that it will worsen with continued smoking. Continue symbicort and spiriva

## 2017-06-09 NOTE — Assessment & Plan Note (Signed)
Reminded about continued abuse and the negative effects on her health. She is not able to make an attempt to quit at this time.

## 2017-06-16 ENCOUNTER — Other Ambulatory Visit: Payer: Self-pay

## 2017-06-16 MED ORDER — ESOMEPRAZOLE MAGNESIUM 40 MG PO CPDR: DELAYED_RELEASE_CAPSULE | ORAL | 1 refills | Status: DC

## 2017-06-26 ENCOUNTER — Telehealth: Payer: Self-pay | Admitting: Internal Medicine

## 2017-06-26 NOTE — Telephone Encounter (Signed)
Pt called and needs a refill of her  ALPRAZolam (XANAX) 1 MG tablet morphine (MS CONTIN) 100 MG 12 hr tablet  morphine (MSIR) 15 MG tablet   Please advise does she need appt or can these be refilled.

## 2017-06-26 NOTE — Telephone Encounter (Signed)
Notified pt w/MD response.../lmb 

## 2017-06-26 NOTE — Telephone Encounter (Signed)
Not due until 07/09/17 for MS contin and morphine. Xanax with refills still.

## 2017-07-03 ENCOUNTER — Telehealth: Payer: Self-pay | Admitting: Internal Medicine

## 2017-07-03 NOTE — Telephone Encounter (Signed)
Copied from [place]. Topic: Quick Communication - See Telephone Encounter >> Jul 03, 2017 12:58 PM Corie Chiquito, [place] wrote: CRM for notification. See Telephone encounter for: Patient called because she needs when she could she come by and pick up her hard copy of her scripts to take to the pharmacy.Would like to have them by Thursday or Friday.  Patient does have an appointment to be seen on 08-09-2017. If someone could please give her a call about this   07/03/17.

## 2017-07-03 NOTE — Telephone Encounter (Signed)
Patient requesting refill on both of her morphine.

## 2017-07-04 MED ORDER — MORPHINE SULFATE ER 100 MG PO TBCR: 100.0000 mg | EXTENDED_RELEASE_TABLET | Freq: Two times a day (BID) | ORAL | 0 refills | Status: DC

## 2017-07-04 MED ORDER — MORPHINE SULFATE 15 MG PO TABS: 15.0000 mg | ORAL_TABLET | Freq: Two times a day (BID) | ORAL | 0 refills | Status: DC | PRN

## 2017-07-04 NOTE — Telephone Encounter (Signed)
Patient informed. 

## 2017-07-04 NOTE — Telephone Encounter (Signed)
Refills sent in electronically and she does not need to pick up.

## 2017-07-06 DIAGNOSIS — J449 Chronic obstructive pulmonary disease, unspecified: Secondary | ICD-10-CM | POA: Diagnosis not present

## 2017-07-15 ENCOUNTER — Encounter (HOSPITAL_COMMUNITY): Payer: Self-pay

## 2017-07-15 ENCOUNTER — Other Ambulatory Visit: Payer: Self-pay

## 2017-07-15 ENCOUNTER — Emergency Department (HOSPITAL_COMMUNITY): Payer: Medicare Other

## 2017-07-15 ENCOUNTER — Emergency Department (HOSPITAL_COMMUNITY)
Admission: EM | Admit: 2017-07-15 | Discharge: 2017-07-15 | Disposition: A | Discharge status: Home / Self Care | Payer: Medicare Other | Attending: Emergency Medicine | Admitting: Emergency Medicine

## 2017-07-15 DIAGNOSIS — D069 Carcinoma in situ of cervix, unspecified: Secondary | ICD-10-CM | POA: Diagnosis not present

## 2017-07-15 DIAGNOSIS — R110 Nausea: Secondary | ICD-10-CM | POA: Diagnosis not present

## 2017-07-15 DIAGNOSIS — R509 Fever, unspecified: Secondary | ICD-10-CM | POA: Diagnosis not present

## 2017-07-15 DIAGNOSIS — R1013 Epigastric pain: Secondary | ICD-10-CM | POA: Insufficient documentation

## 2017-07-15 DIAGNOSIS — R5383 Other fatigue: Secondary | ICD-10-CM | POA: Insufficient documentation

## 2017-07-15 DIAGNOSIS — R109 Unspecified abdominal pain: Secondary | ICD-10-CM | POA: Diagnosis not present

## 2017-07-15 DIAGNOSIS — Z79899 Other long term (current) drug therapy: Secondary | ICD-10-CM | POA: Diagnosis not present

## 2017-07-15 DIAGNOSIS — M791 Myalgia, unspecified site: Secondary | ICD-10-CM

## 2017-07-15 DIAGNOSIS — F17210 Nicotine dependence, cigarettes, uncomplicated: Secondary | ICD-10-CM | POA: Diagnosis not present

## 2017-07-15 DIAGNOSIS — R112 Nausea with vomiting, unspecified: Secondary | ICD-10-CM | POA: Diagnosis not present

## 2017-07-15 DIAGNOSIS — J449 Chronic obstructive pulmonary disease, unspecified: Secondary | ICD-10-CM | POA: Insufficient documentation

## 2017-07-15 DIAGNOSIS — M549 Dorsalgia, unspecified: Secondary | ICD-10-CM | POA: Diagnosis not present

## 2017-07-15 DIAGNOSIS — R6883 Chills (without fever): Secondary | ICD-10-CM | POA: Diagnosis not present

## 2017-07-15 DIAGNOSIS — R1110 Vomiting, unspecified: Secondary | ICD-10-CM | POA: Diagnosis not present

## 2017-07-15 LAB — COMPREHENSIVE METABOLIC PANEL
ALK PHOS: 71 U/L (ref 38–126)
ALT: 10 U/L — AB (ref 14–54)
ANION GAP: 7 (ref 5–15)
AST: 21 U/L (ref 15–41)
Albumin: 3.6 g/dL (ref 3.5–5.0)
BILIRUBIN TOTAL: 0.4 mg/dL (ref 0.3–1.2)
BUN: 11 mg/dL (ref 6–20)
CALCIUM: 9.4 mg/dL (ref 8.9–10.3)
CO2: 30 mmol/L (ref 22–32)
CREATININE: 0.71 mg/dL (ref 0.44–1.00)
Chloride: 101 mmol/L (ref 101–111)
GFR calc non Af Amer: >60 mL/min (ref >60)
GLUCOSE: 93 mg/dL (ref 65–99)
Potassium: 4.2 mmol/L (ref 3.5–5.1)
Sodium: 138 mmol/L (ref 135–145)
TOTAL PROTEIN: 6.9 g/dL (ref 6.5–8.1)

## 2017-07-15 LAB — URINALYSIS, ROUTINE W REFLEX MICROSCOPIC
Bilirubin Urine: NEGATIVE
GLUCOSE, UA: NEGATIVE mg/dL
Hgb urine dipstick: NEGATIVE
Ketones, ur: NEGATIVE mg/dL
NITRITE: NEGATIVE
PH: 5 (ref 5.0–8.0)
Protein, ur: NEGATIVE mg/dL
SPECIFIC GRAVITY, URINE: 1.021 (ref 1.005–1.030)

## 2017-07-15 LAB — CBC
HCT: 44.7 % (ref 36.0–46.0)
HEMOGLOBIN: 14.9 g/dL (ref 12.0–15.0)
MCH: 32.5 pg (ref 26.0–34.0)
MCHC: 33.3 g/dL (ref 30.0–36.0)
MCV: 97.6 fL (ref 78.0–100.0)
PLATELETS: 348 10*3/uL (ref 150–400)
RBC: 4.58 MIL/uL (ref 3.87–5.11)
RDW: 12.9 % (ref 11.5–15.5)
WBC: 4.9 10*3/uL (ref 4.0–10.5)

## 2017-07-15 LAB — POC OCCULT BLOOD, ED: FECAL OCCULT BLD: NEGATIVE

## 2017-07-15 LAB — LIPASE, BLOOD: Lipase: 19 U/L (ref 11–51)

## 2017-07-15 LAB — I-STAT BETA HCG BLOOD, ED (MC, WL, AP ONLY): I-stat hCG, quantitative: <5 m[IU]/mL (ref <5)

## 2017-07-15 MED ORDER — ONDANSETRON HCL 4 MG/2ML IJ SOLN
4.0000 mg | Freq: Once | INTRAMUSCULAR | Status: AC
  Administered 2017-07-15: 4 mg via INTRAVENOUS
  Filled 2017-07-15: qty 2

## 2017-07-15 MED ORDER — SODIUM CHLORIDE 0.9 % IV BOLUS (SEPSIS)
1000.0000 mL | Freq: Once | INTRAVENOUS | Status: AC
  Administered 2017-07-15: 1000 mL via INTRAVENOUS

## 2017-07-15 MED ORDER — GI COCKTAIL ~~LOC~~
30.0000 mL | Freq: Once | ORAL | Status: AC
  Administered 2017-07-15: 30 mL via ORAL
  Filled 2017-07-15: qty 30

## 2017-07-15 MED ORDER — IOPAMIDOL (ISOVUE-300) INJECTION 61%
INTRAVENOUS | Status: AC
  Administered 2017-07-15: 100 mL
  Filled 2017-07-15: qty 100

## 2017-07-15 MED ORDER — FENTANYL CITRATE (PF) 100 MCG/2ML IJ SOLN
50.0000 ug | Freq: Once | INTRAMUSCULAR | Status: AC
  Administered 2017-07-15: 50 ug via INTRAVENOUS
  Filled 2017-07-15: qty 2

## 2017-07-15 NOTE — ED Notes (Signed)
This RN wasted 24mcg fentanyl x2 on accident. Charge RN made aware, pharmacy notified of error.

## 2017-07-15 NOTE — Discharge Instructions (Signed)
As discussed, make sure that you continue taking her omeprazole daily. Avoid alcohol, smoking, spicy foods and follow a bland diet. Drink plenty of fluids and follow up with gastroenterology and your primary care provider.  Return if symptoms worsen or new concerning symptoms in the meantime.

## 2017-07-15 NOTE — ED Provider Notes (Signed)
[facility] EMERGENC[facility] DEPARTMENT Provider Note   CSN: [id number] Arrival date & time: 07/15/17  1033     History   Chief Complaint No chief complaint on file.   HPI [name] is a 56 y.o. female past medical history significant for anxiety, major depression, COPD, fibromyalgia, GERD, hemorrhoids presenting with 3-4 days of gradual onset generalized body aches, chills, fatigue. Patient reports that she is hurting all over and has multiple nonspecific complaints.  She reports that she may be coughing more for the past mild epigastric pain that has been worsening.  Also reports her last bowel movement 3 or 4 days ago states that it was black.  She has been nauseated and reports vomiting 3 nights ago. Her main concern and new symptom is epigastric pain, body aches and chills. She reports that she has been taking her morphine at home without relief and is requesting something for pain.  She denies any urinary symptoms.  HPI  Past Medical History:  Diagnosis Date  . Anxiety   . Arthritis   . Cervical cancer ([place])    Discovered by pap smear, treated by Dr. Charlesetta Garibaldi with LEEP procedure.  Pap since that time has been normal.  . Chronic back pain     her CT of the head without contrast June 28, 2008- moderate to large right paracentral disc protrusion at C4-C5 resulting in a right C5 neuroforaminal stenosis and probably some degree of spinal stenosis. C. bilateral C6 neuroforaminal stenosis related to  degenerative hypertrophy, no acute fracture or loose pieces identifying the cervical spine, ligamentous injury is not excluded.    Marland Kitchen COPD (chronic obstructive pulmonary disease) ([place])   . Depression   . Fibromyalgia   . GERD (gastroesophageal reflux disease)  October 2011    [facility], esophagogram - tiny amount of gastroesophageal reflux otherwise unremarkable exam, done by Dr. Dellis Filbert  . Headache(784.0)   . Hemorrhoids   . Osteoporosis    T score L femur neck =  -2.6 (02/21/12)    Patient Active Problem List   Diagnosis Date Noted  . B12 deficiency 07/16/2015  . Abdominal pain 07/03/2015  . Cachexia ([place]) 07/03/2015  . Protein-calorie malnutrition, severe ([place]) 05/30/2014  . Alopecia 10/30/2013  . Osteoporosis 03/11/2012  . Tobacco abuse 12/08/2011  . Weight loss 12/08/2011  . Preventative health care 10/19/2011  . Chronic pain syndrome 09/22/2011  . GERD 01/08/2009  . Multiple vitamin deficiency (Vit D) and h/o B12 deficiency 02/28/2007  . Depression with anxiety 02/27/2007  . COPD, severe ([place]) 02/27/2007    Past Surgical History:  Procedure Laterality Date  . CERVICAL CONE BIOPSY     leep procedure  . MULTIPLE EXTRACTIONS WITH ALVEOLOPLASTY  06/18/2012   Procedure: MULTIPLE EXTRACION WITH ALVEOLOPLASTY;  Surgeon: Gae Bon, DDS;  Location: [facility];  Service: Oral Surgery;  Laterality: Bilateral;    OB History    Gravida Para Term Preterm AB Living   1 1 1     2    SAB TAB Ectopic Multiple Live Births           1       Home Medications    Prior to Admission medications   Medication Sig Start Date End Date Taking? Authorizing Provider  ALPRAZolam Duanne Moron) 1 MG tablet TAKE 1 TABLET BY MOUTH 4 TIMES A DAY AS NEEDED FOR ANXIETY 04/06/17   Hoyt Koch, MD  bisacodyl (DULCOLAX) 5 MG EC tablet Take 5 mg by mouth daily as  needed for mild constipation or moderate constipation.    [provider]  esomeprazole (NEXIUM) 40 MG capsule TAKE ONE CAPSULE BY MOUTH TWICE A DAY BEFORE A MEAL 06/16/17   Hoyt Koch, MD  guaiFENesin 200 MG tablet Take 1 tablet (200 mg total) every 8 (eight) hours as needed by mouth for cough or to loosen phlegm. 06/07/17   Hoyt Koch, MD  lactose free nutrition (BOOST) LIQD Take 237 mLs by mouth 2 (two) times daily. Vanilla    [provider]  megestrol (MEGACE ES) 625 MG/5ML suspension TAKE 5 MLS (625 MG TOTAL) BY MOUTH DAILY. 03/21/17   Hoyt Koch, MD    morphine (MS CONTIN) 100 MG 12 hr tablet Take 1 tablet (100 mg total) by mouth every 12 (twelve) hours. 07/04/17   Hoyt Koch, MD  morphine (MSIR) 15 MG tablet Take 1 tablet (15 mg total) by mouth 2 (two) times daily as needed for severe pain. 07/04/17   Hoyt Koch, MD  ondansetron (ZOFRAN) 4 MG tablet Take 1 tablet (4 mg total) by mouth every 8 (eight) hours as needed for nausea or vomiting. 08/22/16   Hoyt Koch, MD  OXYGEN Inhale 3 L/min into the lungs as needed.    [provider]  polyethylene glycol powder (GLYCOLAX/MIRALAX) powder TAKE 1 CAPFUL EVERY DAY 04/17/17   Hoyt Koch, MD  predniSONE (DELTASONE) 20 MG tablet Take 2 tablets (40 mg total) daily with breakfast by mouth. 06/07/17   Hoyt Koch, MD  promethazine (PHENERGAN) 25 MG tablet TAKE 1 TABLET (25 MG TOTAL) BY MOUTH EVERY 8 (EIGHT) HOURS AS NEEDED FOR NAUSEA OR VOMITING. 12/09/16   Hoyt Koch, MD  SPIRIVA HANDIHALER 18 MCG inhalation capsule PLACE 1 CAPSULE INTO INHALER AND INHALE DAILY 10/07/16   Hoyt Koch, MD  SYMBICORT 160-4.5 MCG/ACT inhaler INHALE 2 PUFFS INTO THE LUNGS 2 (TWO) TIMES DAILY. 01/30/17   [place], FNP    Family History Family History  Problem Relation Age of Onset  . Stroke Mother   . Diabetes Mother   . Heart disease Father   . Lung cancer Daughter     Social History Social History   Tobacco Use  . Smoking status: Current Every Day Smoker    Packs/day: 1.00    Years: 34.00    Pack years: 34.00    Types: Cigarettes  . Smokeless tobacco: Never Used  . Tobacco comment: hx drugs non since 95/ TRYING TO QUITS/ HAS CUT BACK  Substance Use Topics  . Alcohol use: No    Alcohol/week: 0.0 oz  . Drug use: No     Allergies   Aripiprazole; Chantix [varenicline]; Pregabalin; and Zolpidem tartrate   Review of Systems Review of Systems  Constitutional: Positive for chills, fatigue and fever. Negative for diaphoresis.   HENT: Negative for ear pain, sore throat and trouble swallowing.   Eyes: Negative for pain and visual disturbance.  Respiratory: Positive for cough. Negative for choking, chest tightness, shortness of breath, wheezing and stridor.   Cardiovascular: Negative for chest pain, palpitations and leg swelling.  Gastrointestinal: Positive for abdominal pain, nausea and vomiting. Negative for abdominal distention.       Reports reduced gas and last BM black 3 or 4 days ago.  Genitourinary: Negative for difficulty urinating, dysuria, flank pain, frequency and hematuria.  Musculoskeletal: Positive for back pain and myalgias. Negative for arthralgias.  Skin: Negative for color change, pallor and rash.  Neurological:  Positive for weakness. Negative for dizziness, seizures, syncope, facial asymmetry, speech difficulty, light-headedness, numbness and headaches.       She reports feeling generally weak. No focal weakness or deficits     Physical Exam Updated Vital Signs BP 100/66   Pulse 66   Temp 98.3 F (36.8 C) (Oral)   Resp 15   Ht 5\' 7"  (1.702 m)   Wt 46.3 kg (102 lb)   LMP 09/29/2003   SpO2 94%   BMI 15.98 kg/m   Physical Exam  Constitutional: She is oriented to person, place, and time. She appears well-developed and well-nourished. No distress.  Afebrile, cachectic, lying in bed in no acute distress.   HENT:  Head: Normocephalic and atraumatic.  Eyes: Conjunctivae and EOM are normal. Right eye exhibits no discharge. Left eye exhibits no discharge. No scleral icterus.  Neck: Normal range of motion. Neck supple.  Cardiovascular: Normal rate, regular rhythm, normal heart sounds and intact distal pulses.  No murmur heard. Pulmonary/Chest: Effort normal and breath sounds normal. No stridor. No respiratory distress. She has no wheezes. She has no rales. She exhibits no tenderness.  Abdominal: Soft. She exhibits no distension and no mass. There is tenderness. There is guarding. There is no  rebound.  Tenderness to palpation of the epigastric region  Musculoskeletal: Normal range of motion. She exhibits no edema or deformity.  Neurological: She is alert and oriented to person, place, and time.  Skin: Skin is warm and dry. No rash noted. She is not diaphoretic. No erythema. No pallor.  Psychiatric: She has a normal mood and affect.  Nursing note and vitals reviewed.    ED Treatments / Results  Labs (all labs ordered are listed, but only abnormal results are displayed) Labs Reviewed  COMPREHENSIVE METABOLIC PANEL - Abnormal; Notable for the following components:      Result Value   ALT 10 (*)    All other components within normal limits  URINALYSIS, ROUTINE W REFLEX MICROSCOPIC - Abnormal; Notable for the following components:   APPearance HAZY (*)    Leukocytes, UA SMALL (*)    Bacteria, UA RARE (*)    Squamous Epithelial / LPF 0-5 (*)    All other components within normal limits  URINE CULTURE  LIPASE, BLOOD  CBC  OCCULT BLOOD X 1 CARD TO LAB, STOOL  I-STAT BETA HCG BLOOD, ED ([facility], [facility], AP ONLY)  POC OCCULT BLOOD, ED    EKG  EKG Interpretation  Date/Time:  Saturday July 15 2017 11:56:05 EST Ventricular Rate:  77 PR Interval:    QRS Duration: 79 QT Interval:  407 QTC Calculation: 461 R Axis:   88 Text Interpretation:  Sinus rhythm Right atrial enlargement LVH by voltage Minimal ST elevation, inferior leads No significant change since last tracing Confirmed by Dorie Rank [id number]) on 07/15/2017 1:11:52 PM       Radiology Dg Chest 2 View  Result Date: 07/15/2017 CLINICAL DATA:  Chills and fatigue. EXAM: CHEST  2 VIEW COMPARISON:  02/28/2015 FINDINGS: The heart size and mediastinal contours are within normal limits. Stable emphysematous lung disease. There is no evidence of pulmonary edema, consolidation, pneumothorax, nodule or pleural fluid. The visualized skeletal structures are unremarkable. IMPRESSION: Stable chronic emphysematous lung disease.  Electronically Signed   By: Aletta Edouard M.D.   On: 07/15/2017 12:22   Ct Abdomen Pelvis W Contrast  Result Date: 07/15/2017 CLINICAL DATA:  Nausea and vomiting EXAM: CT ABDOMEN AND PELVIS WITH CONTRAST TECHNIQUE: Multidetector CT imaging of  the abdomen and pelvis was performed using the standard protocol following bolus administration of intravenous contrast. CONTRAST:  13mL ISOVUE-300 IOPAMIDOL (ISOVUE-300) INJECTION 61% COMPARISON:  07/03/2015 FINDINGS: Lower chest: No acute abnormality. Hepatobiliary: No focal liver abnormality is seen. No gallstones, gallbladder wall thickening, or biliary dilatation. Pancreas: Unremarkable. No pancreatic ductal dilatation or surrounding inflammatory changes. Spleen: Normal in size without focal abnormality. Adrenals/Urinary Tract: The adrenal glands are within normal limits. The kidneys are well visualized bilaterally. No renal calculi or obstructive changes are seen. The bladder is well distended and within normal limits. Stomach/Bowel: The appendix is within normal limits. Mild retained fecal material is noted in the distal colon. No obstructive or inflammatory changes are seen. Vascular/Lymphatic: Aortic atherosclerosis. No enlarged abdominal or pelvic lymph nodes. Reproductive: Uterus and bilateral adnexa are unremarkable. Other: No abdominal wall hernia or abnormality. No abdominopelvic ascites. Musculoskeletal: Mild degenerative changes of lumbar spine are seen. IMPRESSION: No acute abnormality is noted. No findings to correspond with patient's given clinical symptomatology are seen. Electronically Signed   By: Inez Catalina M.D.   On: 07/15/2017 14:06    Procedures Procedures (including critical care time)  Medications Ordered in ED Medications  fentaNYL (SUBLIMAZE) injection 50 mcg (50 mcg Intravenous Given 07/15/17 1153)  ondansetron (ZOFRAN) injection 4 mg (4 mg Intravenous Given 07/15/17 1153)  sodium chloride 0.9 % bolus 1,000 mL (1,000 mLs  Intravenous New Bag/Given 07/15/17 1152)  iopamidol (ISOVUE-300) 61 % injection (100 mLs  Contrast Given 07/15/17 1230)  gi cocktail (Maalox,Lidocaine,Donnatal) (30 mLs Oral Given 07/15/17 1340)     Initial Impression / Assessment and Plan / ED Course  I have reviewed the triage vital signs and the nursing notes.  Pertinent labs & imaging results that were available during my care of the patient were reviewed by me and considered in my medical decision making (see chart for details).    Patient presenting with multiple nonspecific complaints and feeling generally achy, with chills, subjective fevers, epigastric pain that has been worsening, vomiting 3 days ago, nausea but not at this time.  Patient is reporting feeling generally weak and does not eat or drink very much.   She is also complaining of dark black stools, and pain on palpation of the epigastrium.  Patient is afebrile, normal vital signs stable Labs unremarkable Imaging negative Hemoccult Negative  Patient significantly improved while in the emergency department and was resting comfortably on reassessment.  Discharge home follow-up with gastroenterology and PCP.  Patient was advised to continue with her omeprazole daily and to follow a bland diet and avoid triggers.   Discussed strict return precautions and advised to return to the emergency department if experiencing any new or worsening symptoms. Instructions were understood and patient agreed with discharge plan.  Final Clinical Impressions(s) / ED Diagnoses   Final diagnoses:  Epigastric pain  Myalgia  Fatigue, unspecified type    ED Discharge Orders    None       Dossie Der 07/15/17 1423    Dorie Rank, MD 07/16/17 [phone]

## 2017-07-15 NOTE — ED Triage Notes (Signed)
Patient complains of 4 days of fatigue, chills and abdominal with back pain and ongoing nausea. Reports 1 episode of emesis 2 days ago. Alert and oriented

## 2017-07-15 NOTE — ED Notes (Signed)
Patient transported to CT 

## 2017-07-16 LAB — URINE CULTURE: Culture: NO GROWTH

## 2017-07-17 ENCOUNTER — Other Ambulatory Visit: Payer: Self-pay | Admitting: Internal Medicine

## 2017-08-02 ENCOUNTER — Telehealth: Payer: Self-pay | Admitting: Internal Medicine

## 2017-08-02 NOTE — Telephone Encounter (Signed)
Copied from [place]. Topic: Quick Communication - See Telephone Encounter >> Aug 02, 2017 10:40 AM Oneta Rack wrote: CRM for notification. See Telephone encounter for:   08/02/17.   Relation to pt: self Call back number: [phone] Pharmacy: [id number] - [place], [place] [phone] (Phone) [phone] (Fax)    Reason for call:  Patient requesting ALPRAZolam (XANAX) 1 MG tablet, morphine (MS CONTIN) 100 MG 12 hr tablet, morphine (MSIR) 15 MG tablet

## 2017-08-02 NOTE — Telephone Encounter (Signed)
Check [place] registry last filled alprazolam & morphine scripts on 07/08/2017. Will hold to MD address tomorrow.Marland KitchenJohny Chess

## 2017-08-03 ENCOUNTER — Telehealth: Payer: Self-pay | Admitting: Internal Medicine

## 2017-08-03 MED ORDER — ALPRAZOLAM 1 MG PO TABS: ORAL_TABLET | ORAL | 3 refills | Status: DC

## 2017-08-03 MED ORDER — MORPHINE SULFATE 15 MG PO TABS: 15.0000 mg | ORAL_TABLET | Freq: Two times a day (BID) | ORAL | 0 refills | Status: DC | PRN

## 2017-08-03 MED ORDER — MORPHINE SULFATE ER 100 MG PO TBCR: 100.0000 mg | EXTENDED_RELEASE_TABLET | Freq: Two times a day (BID) | ORAL | 0 refills | Status: DC

## 2017-08-03 NOTE — Telephone Encounter (Signed)
Notified pt w/MD response.../lmb 

## 2017-08-03 NOTE — Telephone Encounter (Signed)
Copied from [place]. Topic: Quick Communication - See Telephone Encounter >> Aug 02, 2017 10:40 AM Oneta Rack wrote: CRM for notification. See Telephone encounter for:   08/02/17.   Relation to pt: self Call back number: [phone] Pharmacy: [id number] - [place], [place] [phone] (Phone) [phone] (Fax)    Reason for call:  Patient requesting ALPRAZolam Duanne Moron) 1 MG tablet, morphine (MS CONTIN) 100 MG 12 hr tablet, morphine (MSIR) 15 MG tablet  >> Aug 03, 2017 11:31 AM Yvette Rack wrote: Patient calling back about her refill on her Xanax and her two prescriptions of Morphine she states that she need this medicine by tomorrow patient had called yesterday I advised her it could take up to three days for medicine refill

## 2017-08-03 NOTE — Telephone Encounter (Signed)
Duplicate msg waiting on MD to address msg from yesterday. Pt is not due for refills until 08/08/17...[name]

## 2017-08-03 NOTE — Telephone Encounter (Signed)
Refill sent in but cannot be filled until 08/08/17

## 2017-08-06 DIAGNOSIS — J449 Chronic obstructive pulmonary disease, unspecified: Secondary | ICD-10-CM | POA: Diagnosis not present

## 2017-08-09 ENCOUNTER — Encounter: Payer: Self-pay | Admitting: Nurse Practitioner

## 2017-08-09 ENCOUNTER — Ambulatory Visit (INDEPENDENT_AMBULATORY_CARE_PROVIDER_SITE_OTHER): Payer: Medicare Other | Admitting: Nurse Practitioner

## 2017-08-09 ENCOUNTER — Encounter: Payer: Self-pay | Admitting: Internal Medicine

## 2017-08-09 ENCOUNTER — Ambulatory Visit (INDEPENDENT_AMBULATORY_CARE_PROVIDER_SITE_OTHER): Payer: Medicare Other | Admitting: Internal Medicine

## 2017-08-09 VITALS — BP 100/70 | HR 84 | Ht 67.0 in | Wt 97.0 lb

## 2017-08-09 DIAGNOSIS — Z0289 Encounter for other administrative examinations: Secondary | ICD-10-CM

## 2017-08-09 DIAGNOSIS — R110 Nausea: Secondary | ICD-10-CM

## 2017-08-09 DIAGNOSIS — R1013 Epigastric pain: Secondary | ICD-10-CM | POA: Diagnosis not present

## 2017-08-09 DIAGNOSIS — Z1211 Encounter for screening for malignant neoplasm of colon: Secondary | ICD-10-CM

## 2017-08-09 DIAGNOSIS — R109 Unspecified abdominal pain: Principal | ICD-10-CM

## 2017-08-09 DIAGNOSIS — F1120 Opioid dependence, uncomplicated: Secondary | ICD-10-CM | POA: Diagnosis not present

## 2017-08-09 DIAGNOSIS — G8929 Other chronic pain: Secondary | ICD-10-CM | POA: Diagnosis not present

## 2017-08-09 DIAGNOSIS — M542 Cervicalgia: Secondary | ICD-10-CM | POA: Diagnosis not present

## 2017-08-09 NOTE — Patient Instructions (Addendum)
We have updated the contract today for the morphine. You have a refill at the pharmacy already.  The opana we cannot change to because the insurance will not cover it as it is only brand name.

## 2017-08-09 NOTE — Progress Notes (Signed)
Chief Complaint:  ED follow up  HPI: Patient is a 57 yo female known remote to Dr. Henrene Pastor who has evaluated her for chronic intermittent abdominal pain, nausea and weight loss. She had an EGD years ago for weight loss, it was normal. Patient was in ED on 12/15 for evaluation of nausea  / black stool  x1 and worsening of her chronic epigastric pain. She has been on omeprazole for years for burning in stomach. Nausea started about 6 months ago. Doesn't generally vomit unless nerves get bad. Can't correlate nausea with any of her medications.  She is a poor historian and can't say for sure that pain is any worse with eating. She has chronic back pain  She is on chronic narcotics. Can't tell if epigastric pain radiates through to her back. Weight is down 5 pounds recently. Keeps complaining of generalized joint aches.   ED labs:  Serum CMET normal and lipase. CBC was normal. CT scan with contrast was unrevealing.    Past Medical History:  Diagnosis Date  . Anxiety   . Arthritis   . Cervical cancer ([place])    Discovered by pap smear, treated by Dr. Charlesetta Garibaldi with LEEP procedure.  Pap since that time has been normal.  . Chronic back pain     her CT of the head without contrast June 28, 2008- moderate to large right paracentral disc protrusion at C4-C5 resulting in a right C5 neuroforaminal stenosis and probably some degree of spinal stenosis. C. bilateral C6 neuroforaminal stenosis related to  degenerative hypertrophy, no acute fracture or loose pieces identifying the cervical spine, ligamentous injury is not excluded.    Marland Kitchen COPD (chronic obstructive pulmonary disease) ([place])   . Depression   . Fibromyalgia   . GERD (gastroesophageal reflux disease)  October 2011    [facility], esophagogram - tiny amount of gastroesophageal reflux otherwise unremarkable exam, done by Dr. Dellis Filbert  . Headache(784.0)   . Hemorrhoids   . Osteoporosis    T score L femur neck = -2.6 (02/21/12)    Patient's  surgical history, family medical history, social history, medications and allergies were all reviewed in Epic    Physical Exam: BP 100/70   Pulse 84   Ht 5\' 7"  (1.702 m)   Wt 97 lb (44 kg)   LMP 09/29/2003   BMI 15.19 kg/m   GENERAL:  Thin white female in NAD PSYCH: :Pleasant, cooperative, normal affect EENT:  conjunctiva pink, mucous membranes moist, neck supple without masses CARDIAC:  RRR, no murmur heard, no peripheral edema PULM: Normal respiratory effort, lungs CTA bilaterally, no wheezing ABDOMEN:  Nondistended, soft, mild epigastric tenderness . No obvious masses, no hepatomegaly,  normal bowel sounds SKIN:  turgor, no lesions seen Musculoskeletal:  Normal muscle tone, normal strength NEURO: Alert and oriented x 3, no focal neurologic deficits   ASSESSMENT and PLAN:  1. Pleasant 57 year old with chronic epigastric pain, chronic nausea.  ED visit a month ago for worsening epigastric pain as well as one episode of vomiting and one black stool.  Labs including CBC, chemistries, LFTs and lipase were unremarkable.  CT scan with contrast unremarkable.  Asha has chronic generalized pain, she is on chronic narcotics.  She is a poor historian, gives vague and conflicting answers to straightforward questions but the epigastric discomfort is chronic and seems to be at her baseline for now.  -No additional GI workup planned for now. Call if sx change or worsen -continue daily PPI.  -  Not taking any NSAIDS  2. Colon cancer screening- inadequate prep at time of last colonoscopy 10 years ago. Not interested in screening right now and doesn't think she could tolerate prep. She has no blood in stool.   3. COPD, on home 02. Still smoking a ppd  4.  Chronic pain, on chronic narcotics  Tye Savoy , NP 08/09/2017, 10:02 AM

## 2017-08-09 NOTE — Patient Instructions (Signed)
If you are age 59 or older, your body mass index should be between 23-30. Your Body mass index is 15.19 kg/m. If this is out of the aforementioned range listed, please consider follow up with your Primary Care Provider.  If you are age 27 or younger, your body mass index should be between 19-25. Your Body mass index is 15.19 kg/m. If this is out of the aformentioned range listed, please consider follow up with your Primary Care Provider.   Follow up as needed. Thank you for choosing me and [facility].   Tye Savoy, NP

## 2017-08-09 NOTE — Progress Notes (Signed)
   Subjective:    Patient ID: [name], female    DOB: 22-Feb-1961, 57 y.o.   MRN: [id number]  HPI The patient is a 57 y.o. F coming in for chronic pain management. The patient is taking morphine ER BID and morphine IR BID prn. They take it for chronic back and neck pain. She denies taking too much medication. [place] narcotic database reviewed and appropriate today. She is on disability for injury some time ago. She does have some nausea from the medication which is chronic but she feels that the increase in QOL is worth it. Pain without medication 10/10 and with medication 6/10 and she is able to do most of her ADLS. She does live independently.   Review of Systems  Constitutional: Negative.   HENT: Negative.   Eyes: Negative.   Respiratory: Negative for cough, chest tightness and shortness of breath.   Cardiovascular: Negative for chest pain, palpitations and leg swelling.  Gastrointestinal: Negative for abdominal distention, abdominal pain, constipation, diarrhea, nausea and vomiting.  Musculoskeletal: Positive for arthralgias, back pain and gait problem.  Skin: Negative.   Psychiatric/Behavioral: Negative.       Objective:   Physical Exam  Constitutional: She is oriented to person, place, and time. She appears well-developed and well-nourished.  HENT:  Head: Normocephalic and atraumatic.  Eyes: EOM are normal.  Neck: Normal range of motion.  Cardiovascular: Normal rate and regular rhythm.  Pulmonary/Chest: Effort normal and breath sounds normal. No respiratory distress. She has no wheezes. She has no rales.  Abdominal: Soft. Bowel sounds are normal. She exhibits no distension. There is no tenderness. There is no rebound.  Musculoskeletal: She exhibits tenderness. She exhibits no edema.  Neurological: She is alert and oriented to person, place, and time. Coordination abnormal.  cane  Skin: Skin is warm and dry.  Psychiatric: She has a normal mood and affect.   Vitals:   08/09/17 1100  BP: 100/70  Pulse: 96  Temp: 97.7 F (36.5 C)  TempSrc: Oral  SpO2: 93%  Weight: 97 lb (44 kg)  Height: 5\' 7"  (1.702 m)      Assessment & Plan:

## 2017-08-10 ENCOUNTER — Institutional Professional Consult (permissible substitution): Payer: Medicare Other | Admitting: Pulmonary Disease

## 2017-08-11 ENCOUNTER — Encounter: Payer: Self-pay | Admitting: Internal Medicine

## 2017-08-11 ENCOUNTER — Encounter: Payer: Self-pay | Admitting: Nurse Practitioner

## 2017-08-11 DIAGNOSIS — Z0289 Encounter for other administrative examinations: Secondary | ICD-10-CM | POA: Insufficient documentation

## 2017-08-11 NOTE — Progress Notes (Signed)
Assessment noted 

## 2017-08-11 NOTE — Assessment & Plan Note (Signed)
Is on chronic opioids for neck and back pain. She does have nausea but is getting more QOL improvement than problems with nausea. She wishes to continue. Controlled substance contract signed today and [place] narcotic database reviewed and no inappropriate fills. UDS up to date and most recent is appropriate.

## 2017-08-11 NOTE — Assessment & Plan Note (Signed)
[place] narcotic database reviewed and appropriate.The patient is taking morphine ER BID and morphine IR BID. They take it for neck and back pain. This medication allows them to do the following ADLs: cooking, cleaning, bathing. They deny taking too much medication in the last 3 months.

## 2017-08-11 NOTE — Assessment & Plan Note (Signed)
Reviewed policies from changes today and contract signed for morphine ER and IR monthy. She is aware of the need for pain management visit every 3 months.

## 2017-08-13 ENCOUNTER — Other Ambulatory Visit: Payer: Self-pay | Admitting: Internal Medicine

## 2017-08-16 ENCOUNTER — Other Ambulatory Visit: Payer: Self-pay | Admitting: Family

## 2017-08-16 ENCOUNTER — Other Ambulatory Visit: Payer: Self-pay | Admitting: Internal Medicine

## 2017-08-31 ENCOUNTER — Encounter: Payer: Self-pay | Admitting: Pulmonary Disease

## 2017-08-31 ENCOUNTER — Other Ambulatory Visit (INDEPENDENT_AMBULATORY_CARE_PROVIDER_SITE_OTHER): Payer: Medicare Other

## 2017-08-31 ENCOUNTER — Telehealth: Payer: Self-pay | Admitting: Pulmonary Disease

## 2017-08-31 ENCOUNTER — Ambulatory Visit (INDEPENDENT_AMBULATORY_CARE_PROVIDER_SITE_OTHER): Payer: Medicare Other | Admitting: Pulmonary Disease

## 2017-08-31 VITALS — BP 108/64 | HR 99 | Ht 67.0 in | Wt 104.6 lb

## 2017-08-31 DIAGNOSIS — R0602 Shortness of breath: Secondary | ICD-10-CM

## 2017-08-31 DIAGNOSIS — J449 Chronic obstructive pulmonary disease, unspecified: Secondary | ICD-10-CM

## 2017-08-31 DIAGNOSIS — F17200 Nicotine dependence, unspecified, uncomplicated: Secondary | ICD-10-CM

## 2017-08-31 DIAGNOSIS — R05 Cough: Secondary | ICD-10-CM | POA: Diagnosis not present

## 2017-08-31 LAB — CBC WITH DIFFERENTIAL/PLATELET
BASOS ABS: 0 10*3/uL (ref 0.0–0.1)
Basophils Relative: 0.5 % (ref 0.0–3.0)
EOS ABS: 0.1 10*3/uL (ref 0.0–0.7)
Eosinophils Relative: 1.1 % (ref 0.0–5.0)
HCT: 39.9 % (ref 36.0–46.0)
Hemoglobin: 13.3 g/dL (ref 12.0–15.0)
LYMPHS ABS: 2.4 10*3/uL (ref 0.7–4.0)
LYMPHS PCT: 30.1 % (ref 12.0–46.0)
MCHC: 33.3 g/dL (ref 30.0–36.0)
MCV: 96.7 fl (ref 78.0–100.0)
MONOS PCT: 8.1 % (ref 3.0–12.0)
Monocytes Absolute: 0.6 10*3/uL (ref 0.1–1.0)
NEUTROS ABS: 4.8 10*3/uL (ref 1.4–7.7)
NEUTROS PCT: 60.2 % (ref 43.0–77.0)
PLATELETS: 329 10*3/uL (ref 150.0–400.0)
RBC: 4.13 Mil/uL (ref 3.87–5.11)
RDW: 13.5 % (ref 11.5–15.5)
WBC: 8 10*3/uL (ref 4.0–10.5)

## 2017-08-31 MED ORDER — FLUTTER DEVI: 0 refills | Status: DC

## 2017-08-31 MED ORDER — TIOTROPIUM BROMIDE MONOHYDRATE 2.5 MCG/ACT IN AERS: 2.0000 | INHALATION_SPRAY | Freq: Every day | RESPIRATORY_TRACT | 6 refills | Status: DC

## 2017-08-31 MED ORDER — IPRATROPIUM-ALBUTEROL 0.5-2.5 (3) MG/3ML IN SOLN: 3.0000 mL | Freq: Four times a day (QID) | RESPIRATORY_TRACT | 3 refills | Status: AC | PRN

## 2017-08-31 MED ORDER — TIOTROPIUM BROMIDE MONOHYDRATE 2.5 MCG/ACT IN AERS: 2.0000 | INHALATION_SPRAY | Freq: Every day | RESPIRATORY_TRACT | 0 refills | Status: DC

## 2017-08-31 NOTE — Progress Notes (Signed)
[name]    [id number]    March 26, 1961  Primary Care Physician:Crawford, Real Cons, MD  Referring Physician: Hoyt Koch, MD [place], [place] [address]  Chief complaint: Consult for dyspnea  HPI: 57 year old with chronic pain, GERD, fibromyalgia, severe COPD, active smoker.  Seen by Dr. Sharlet Salina, primary care physician in November with COPD exacerbation and treated with prednisone.  She is currently on Symbicort and Spiriva.  She feels that she cannot use her HandiHaler appropriately but to low lung capacity Complains of chronic fatigue, dyspnea with activity and at rest, cough with white mucus.  Denies any wheezing.   Pets: None Occupation: Retired Research officer, political party.  Currently disabled Exposures: No known exposures, no mold, dampness. Smoking history: 40-60-pack-year smoking history.  Continues to smoke 1 pack/day Travel History: Not significant  Outpatient Encounter Medications as of 08/31/2017  Medication Sig  . ALPRAZolam (XANAX) 1 MG tablet TAKE 1 TABLET BY MOUTH 4 TIMES A DAY AS NEEDED FOR ANXIETY  . bisacodyl (DULCOLAX) 5 MG EC tablet Take 5 mg by mouth daily as needed for mild constipation or moderate constipation.  Marland Kitchen esomeprazole (NEXIUM) 40 MG capsule TAKE ONE CAPSULE BY MOUTH TWICE A DAY BEFORE A MEAL  . lactose free nutrition (BOOST) LIQD Take 237 mLs by mouth 2 (two) times daily. Vanilla  . megestrol (MEGACE ES) 625 MG/5ML suspension TAKE 5 MLS (625 MG TOTAL) BY MOUTH DAILY.  Marland Kitchen morphine (MS CONTIN) 100 MG 12 hr tablet Take 1 tablet (100 mg total) by mouth every 12 (twelve) hours.  Marland Kitchen morphine (MSIR) 15 MG tablet Take 1 tablet (15 mg total) by mouth 2 (two) times daily as needed for severe pain.  . OXYGEN Inhale 3 L/min into the lungs as needed.  . polyethylene glycol powder (GLYCOLAX/MIRALAX) powder TAKE 1 CAPFUL EVERY DAY  . promethazine (PHENERGAN) 25 MG tablet TAKE 1 TABLET (25 MG TOTAL) BY MOUTH EVERY 8 (EIGHT) HOURS AS  NEEDED FOR NAUSEA OR VOMITING.  . SPIRIVA HANDIHALER 18 MCG inhalation capsule PLACE 1 CAPSULE INTO INHALER AND INHALE DAILY  . SYMBICORT 160-4.5 MCG/ACT inhaler TAKE 2 PUFFS BY MOUTH TWICE A DAY   No facility-administered encounter medications on file as of 08/31/2017.     Allergies as of 08/31/2017 - Review Complete 08/31/2017  Allergen Reaction Noted  . Aripiprazole Other (See Comments) 03/15/2011  . Chantix [varenicline] Other (See Comments) 10/17/2012  . Pregabalin Hives   . Zolpidem tartrate Other (See Comments)     Past Medical History:  Diagnosis Date  . Anxiety   . Arthritis   . Cervical cancer ([place])    Discovered by pap smear, treated by Dr. Charlesetta Garibaldi with LEEP procedure.  Pap since that time has been normal.  . Chronic back pain     her CT of the head without contrast June 28, 2008- moderate to large right paracentral disc protrusion at C4-C5 resulting in a right C5 neuroforaminal stenosis and probably some degree of spinal stenosis. C. bilateral C6 neuroforaminal stenosis related to  degenerative hypertrophy, no acute fracture or loose pieces identifying the cervical spine, ligamentous injury is not excluded.    Marland Kitchen COPD (chronic obstructive pulmonary disease) ([place])   . Depression   . Fibromyalgia   . GERD (gastroesophageal reflux disease)  October 2011    [place], esophagogram - tiny amount of gastroesophageal reflux otherwise unremarkable exam, done by Dr. Dellis Filbert  . Headache(784.0)   . Hemorrhoids   .  Osteoporosis    T score L femur neck = -2.6 (02/21/12)    Past Surgical History:  Procedure Laterality Date  . CERVICAL CONE BIOPSY     leep procedure  . MULTIPLE EXTRACTIONS WITH ALVEOLOPLASTY  06/18/2012   Procedure: MULTIPLE EXTRACION WITH ALVEOLOPLASTY;  Surgeon: Gae Bon, DDS;  Location: [facility];  Service: Oral Surgery;  Laterality: Bilateral;    Family History  Problem Relation Age of Onset  . Stroke Mother   . Diabetes Mother   . Heart disease  Father   . Lung cancer Brother        smoker    Social History   Socioeconomic History  . Marital status: Divorced    Spouse name: Not on file  . Number of children: 1  . Years of education: Not on file  . Highest education level: Not on file  Social Needs  . Financial resource strain: Not on file  . Food insecurity - worry: Not on file  . Food insecurity - inability: Not on file  . Transportation needs - medical: Not on file  . Transportation needs - non-medical: Not on file  Occupational History  . Occupation: disabled  Tobacco Use  . Smoking status: Current Every Day Smoker    Packs/day: 1.00    Years: 30.00    Pack years: 30.00    Types: Cigarettes  . Smokeless tobacco: Never Used  . Tobacco comment: hx drugs non since 95/ TRYING TO QUITS/ HAS CUT BACK  Substance and Sexual Activity  . Alcohol use: No    Alcohol/week: 0.0 oz  . Drug use: No  . Sexual activity: Not Currently    Birth control/protection: None  Other Topics Concern  . Not on file  Social History Narrative   10th grade education. Married then divorced mid 23's. One daughter. Crack cocaine about 1995 ish. One Mollie [place] admit 2001 for SI. Deemed disabled 04/01/2004 By [facility] due to documented health and mental issues.   Review of systems: Review of Systems  Constitutional: Negative for fever and chills.  HENT: Negative.   Eyes: Negative for blurred vision.  Respiratory: as per HPI  Cardiovascular: Negative for chest pain and palpitations.  Gastrointestinal: Negative for vomiting, diarrhea, blood per rectum. Genitourinary: Negative for dysuria, urgency, frequency and hematuria.  Musculoskeletal: Negative for myalgias, back pain and joint pain.  Skin: Negative for itching and rash.  Neurological: Negative for dizziness, tremors, focal weakness, seizures and loss of consciousness.  Endo/Heme/Allergies: Negative for environmental allergies.  Psychiatric/Behavioral: Negative for depression, suicidal ideas  and hallucinations.  All other systems reviewed and are negative.  Physical Exam: Blood pressure 108/64, pulse 99, height 5\' 7"  (1.702 m), weight 104 lb 9.6 oz (47.4 kg), last menstrual period 09/29/2003, SpO2 92 %. Gen:      No acute distress HEENT:  EOMI, sclera anicteric Neck:     No masses; no thyromegaly Lungs:    Clear to auscultation bilaterally; normal respiratory effort CV:         Regular rate and rhythm; no murmurs Abd:      + bowel sounds; soft, non-tender; no palpable masses, no distension Ext:    No edema; adequate peripheral perfusion Skin:      Warm and dry; no rash Neuro: alert and oriented x 3 Psych: normal mood and affect  Data Reviewed: PFTs 09/18/09 Pre-bronchodilator-  FVC 1.82 [48%], FEV1 1.08 [36%], F/F 59 Post bronchodilator- FVC 2.07 [55%], FEV1 1.60 (53%], F/F 77 Severe obstruction with marked  bronchodilator response.  Chest x-ray 07/15/17-hyperinflation consistent with COPD.  No acute abnormality CT abdomen pelvis 07/15/17-visualized lung bases are clear CT chest 03/09/15-hyperinflation, mild emphysema.  Lungs are otherwise clear I have reviewed the images personally.  Assessment:  Severe COPD Likely has end stage COPD.  PFTs back in 2011 showed severe obstruction and she has continued to smoke so her lung function is likely worse. We will reevaluate with pulmonary function test, CBC differential and alpha-1 antitrypsin level Continue Symbicort.  I will change the HandiHaler to Spiriva Respimat to allow for better delivery to the lung. Add duonebs PRN Continue supplemental oxygen and Mucinex.  We will add flutter valve for better clearance of secretion  Active smoker Continues to smoke 1 pack/day.  Encouraged her to quit completely andshe wants to try on her own.  She had used nicotine replacement and Chantix in the past without success. Refer for low-dose screening CT of the chest.  Plan/Recommendations: - PFTs, 6-minute walk test - Continue  Symbicort.  Change Spiriva HandiHaler to Respimat - Start duo nebs every 6 as needed - Flutter wall, Mucinex - Check CBC differential, alpha-1 antitrypsin level and phenotype - Low-dose screening CT of the chest.  Marshell Garfinkel MD [facility] Pager [phone] 08/31/2017, 12:15 PM  CC: Hoyt Koch, *

## 2017-08-31 NOTE — Telephone Encounter (Signed)
Called pt and walked her through how to correctly use the spiriva respimat and also the flutter valve.  Pt stated she was shown how to use them before she left the office today for her appt but by the time she arrived home, pt could not remember how to do them.  Nothing further needed at this current time.

## 2017-08-31 NOTE — Patient Instructions (Addendum)
We will schedule you for pulmonary function test and a 6-minute walk test Will stop the Spiriva HandiHaler and change it to Respimat.  Continue the Symbicort We ordered a nebulizer and DuoNeb's as needed Please use Mucinex.  We will give you a flutter valve. Continue supplemental oxygen Check chest x-ray today, CBC differential and alpha-1 antitrypsin level and phenotype. We will refer you for low-dose screening CT of the chest. Follow-up in 3 months.

## 2017-09-01 ENCOUNTER — Other Ambulatory Visit: Payer: Self-pay | Admitting: Acute Care

## 2017-09-01 DIAGNOSIS — Z122 Encounter for screening for malignant neoplasm of respiratory organs: Secondary | ICD-10-CM

## 2017-09-01 DIAGNOSIS — F17210 Nicotine dependence, cigarettes, uncomplicated: Principal | ICD-10-CM

## 2017-09-04 ENCOUNTER — Telehealth: Payer: Self-pay | Admitting: Internal Medicine

## 2017-09-04 NOTE — Telephone Encounter (Signed)
Copied from [place] [phone]. Topic: Quick Communication - Rx Refill/Question >> Sep 04, 2017  8:27 AM Waylan Rocher, [place] L wrote: Medication: morphine (MSIR) 15 MG tablet, morphine (MS CONTIN) 100 MG 12 hr tablet    Has the patient contacted their pharmacy? No. Controlled substance   (Agent: If no, request that the patient contact the pharmacy for the refill.)   Preferred Pharmacy (with phone number or street name): [id number] [place], [place] [address] [place] [address]     Agent: Please be advised that RX refills may take up to 3 business days. We ask that you follow-up with your pharmacy.

## 2017-09-04 NOTE — Telephone Encounter (Signed)
Not due until 09/08/17 will hold to MD return for approval../lmb

## 2017-09-04 NOTE — Telephone Encounter (Signed)
Check [place] registry last filled 08/08/2017 on both strengths.Marland KitchenJohny Chess

## 2017-09-05 LAB — ALPHA-1 ANTITRYPSIN PHENOTYPE: A-1 Antitrypsin, Ser: 209 mg/dL — ABNORMAL HIGH (ref 83–199)

## 2017-09-06 ENCOUNTER — Ambulatory Visit: Admission: RE | Admit: 2017-09-06 | Payer: Medicare Other | Source: Ambulatory Visit

## 2017-09-06 ENCOUNTER — Ambulatory Visit (INDEPENDENT_AMBULATORY_CARE_PROVIDER_SITE_OTHER): Payer: Medicare Other | Admitting: Acute Care

## 2017-09-06 ENCOUNTER — Encounter: Payer: Self-pay | Admitting: Acute Care

## 2017-09-06 DIAGNOSIS — F17210 Nicotine dependence, cigarettes, uncomplicated: Secondary | ICD-10-CM

## 2017-09-06 DIAGNOSIS — J449 Chronic obstructive pulmonary disease, unspecified: Secondary | ICD-10-CM | POA: Diagnosis not present

## 2017-09-06 NOTE — Progress Notes (Signed)
Shared Decision Making Visit Lung Cancer Screening Program [phone])   Eligibility:  Age 57 y.o.  Pack Years Smoking History Calculation 40 pack year smoking history (# packs/per year x # years smoked)  Recent History of coughing up blood  no  Unexplained weight loss? no ( >Than 15 pounds within the last 6 months )  Prior History Lung / other cancer no (Diagnosis within the last 5 years already requiring surveillance chest CT Scans).  Smoking Status Current Smoker  Former Smokers: Years since quit: NA  Quit Date: NA  Visit Components:  Discussion included one or more decision making aids. yes  Discussion included risk/benefits of screening. yes  Discussion included potential follow up diagnostic testing for abnormal scans. yes  Discussion included meaning and risk of over diagnosis. yes  Discussion included meaning and risk of False Positives. yes  Discussion included meaning of total radiation exposure. yes  Counseling Included:  Importance of adherence to annual lung cancer LDCT screening. yes  Impact of comorbidities on ability to participate in the program. yes  Ability and willingness to under diagnostic treatment. yes  Smoking Cessation Counseling:  Current Smokers:   Discussed importance of smoking cessation. yes  Information about tobacco cessation classes and interventions provided to patient. yes  Patient provided with "ticket" for LDCT Scan. yes  Symptomatic Patient. no  Counseling  Diagnosis Code: Tobacco Use Z72.0  Asymptomatic Patient yes  Counseling (Intermediate counseling: > three minutes counseling) O2423  Former Smokers:   Discussed the importance of maintaining cigarette abstinence. yes  Diagnosis Code: Personal History of Nicotine Dependence. N36.144  Information about tobacco cessation classes and interventions provided to patient. Yes  Patient provided with "ticket" for LDCT Scan. yes  Written Order for Lung Cancer  Screening with LDCT placed in Epic. Yes (CT Chest Lung Cancer Screening Low Dose W/O CM) [id number] Z12.2-Screening of respiratory organs Z87.891-Personal history of nicotine dependence  I have spent 25 minutes of face to face time with Ms. Mccauslin discussing the risks and benefits of lung cancer screening. We viewed a power point together that explained in detail the above noted topics. We paused at intervals to allow for questions to be asked and answered to ensure understanding.We discussed that the single most powerful action that she  can take to decrease her risk of developing lung cancer is to quit smoking. We discussed whether or not she is ready to commit to setting a quit date. We discussed options for tools to aid in quitting smoking including nicotine replacement therapy, non-nicotine medications, support groups, Quit Smart classes, and behavior modification. We discussed that often times setting smaller, more achievable goals, such as eliminating 1 cigarette a day for a week and then 2 cigarettes a day for a week can be helpful in slowly decreasing the number of cigarettes smoked. This allows for a sense of accomplishment as well as providing a clinical benefit. I gave her the " Be Stronger Than Your Excuses" card with contact information for community resources, classes, free nicotine replacement therapy, and access to mobile apps, text messaging, and on-line smoking cessation help. I have also given her my card and contact information in the event she needs to contact me. We discussed the time and location of the scan, and that either Doroteo Glassman RN or I will call with the results within 24-48 hours of receiving them. I have offered her  a copy of the power point we viewed  as a resource in the event they need reinforcement  of the concepts we discussed today in the office. The patient verbalized understanding of all of  the above and had no further questions upon leaving the office. They have my  contact information in the event they have any further questions.  I spent 4 minutes counseling on smoking cessation and the health risks of continued tobacco abuse.  I explained to the patient that there has been a high incidence of coronary artery disease noted on these exams. I explained that this is a non-gated exam therefore degree or severity cannot be determined. This patient is not on statin therapy. I have asked the patient to follow-up with their PCP regarding any incidental finding of coronary artery disease and management with diet or medication as their PCP  feels is clinically indicated. The patient verbalized understanding of the above and had no further questions upon completion of the visit.      Magdalen Spatz, NP  09/06/2017

## 2017-09-07 ENCOUNTER — Telehealth: Payer: Self-pay

## 2017-09-07 MED ORDER — MORPHINE SULFATE ER 100 MG PO TBCR: 100.0000 mg | EXTENDED_RELEASE_TABLET | Freq: Two times a day (BID) | ORAL | 0 refills | Status: DC

## 2017-09-07 MED ORDER — MORPHINE SULFATE 15 MG PO TABS: 15.0000 mg | ORAL_TABLET | Freq: Two times a day (BID) | ORAL | 0 refills | Status: DC | PRN

## 2017-09-07 NOTE — Telephone Encounter (Signed)
Did they inform patient? It seems like they should let patient know not [place].

## 2017-09-07 NOTE — Telephone Encounter (Signed)
Notified pt rx sent to [facility]../lmb  

## 2017-09-07 NOTE — Telephone Encounter (Signed)
Pharamcy will let patient know when she picks up Rx

## 2017-09-07 NOTE — Telephone Encounter (Signed)
Sent in refill

## 2017-09-07 NOTE — Telephone Encounter (Signed)
Pharmacy called and wanted to inform us that the CDC and DEA recommend MME less than 90 for neck and back pain. States that after this time they will have to refuse a refill at that high of a dose.

## 2017-09-19 ENCOUNTER — Ambulatory Visit (INDEPENDENT_AMBULATORY_CARE_PROVIDER_SITE_OTHER)
Admission: RE | Admit: 2017-09-19 | Discharge: 2017-09-19 | Disposition: A | Discharge status: Home / Self Care | Payer: Medicare Other | Source: Ambulatory Visit | Attending: Acute Care | Admitting: Acute Care

## 2017-09-19 DIAGNOSIS — Z122 Encounter for screening for malignant neoplasm of respiratory organs: Secondary | ICD-10-CM

## 2017-09-19 DIAGNOSIS — F17210 Nicotine dependence, cigarettes, uncomplicated: Secondary | ICD-10-CM | POA: Diagnosis not present

## 2017-09-21 ENCOUNTER — Telehealth: Payer: Self-pay | Admitting: Pulmonary Disease

## 2017-09-21 DIAGNOSIS — Z122 Encounter for screening for malignant neoplasm of respiratory organs: Secondary | ICD-10-CM

## 2017-09-21 DIAGNOSIS — F17210 Nicotine dependence, cigarettes, uncomplicated: Principal | ICD-10-CM

## 2017-09-22 ENCOUNTER — Telehealth: Payer: Self-pay | Admitting: Acute Care

## 2017-09-22 NOTE — Telephone Encounter (Signed)
Pt informed of CT results per Sarah Groce, NP.  PT verbalized understanding.  Copy sent to PCP.  Order placed for 1 yr f/u CT.  

## 2017-09-22 NOTE — Telephone Encounter (Signed)
Called and spoke with pt clarifying part of the wording that was stated to her this morning when she received the results of her CT scan she had done.  Pt expressed understanding. Nothing further needed at this current time.

## 2017-09-27 ENCOUNTER — Other Ambulatory Visit: Payer: Self-pay | Admitting: Acute Care

## 2017-09-27 ENCOUNTER — Telehealth: Payer: Self-pay | Admitting: Pulmonary Disease

## 2017-09-27 NOTE — Telephone Encounter (Signed)
lmtcb x1 for pt. 

## 2017-09-27 NOTE — Telephone Encounter (Signed)
Spoke with pt  And gave low dose chest CT results.  PT verbalized understanding.  Nothing further needed

## 2017-09-28 DIAGNOSIS — J449 Chronic obstructive pulmonary disease, unspecified: Secondary | ICD-10-CM | POA: Diagnosis not present

## 2017-10-02 ENCOUNTER — Telehealth: Payer: Self-pay | Admitting: Internal Medicine

## 2017-10-02 NOTE — Telephone Encounter (Signed)
Pt requesting refills on:  Morphine IR 15  Last refill was 09/07/17 for 60 tabs MS Contin  100mg    Last refill was 09/07/17 for 60 tabs Alprazolam 1mg   Last refilled was 08/03/17  For 120 tabs.  PCP: Pricilla Holm, MD  LOV  08/09/17 NOV 11/02/17  [phone] [address].  Please review.

## 2017-10-02 NOTE — Telephone Encounter (Signed)
Copied from [place]. Topic: Quick Communication - Rx Refill/Question >> Oct 02, 2017  9:24 AM Sandi Mariscal E, NT wrote: Medication: morphine (MSIR) 15 MG tablet, morphine (MS CONTIN) 100 MG 12 hr tablet and ALPRAZolam (XANAX) 1 MG tablet   Has the patient contacted their pharmacy? Yes    (Agent: If no, request that the patient contact the pharmacy for the refill.)   Preferred Pharmacy (with phone number or street name): [id number] [place], [place] [phone] (Phone) [phone] (Fax)     Agent: Please be advised that RX refills may take up to 3 business days. We ask that you follow-up with your pharmacy.

## 2017-10-03 MED ORDER — MORPHINE SULFATE ER 100 MG PO TBCR: 100.0000 mg | EXTENDED_RELEASE_TABLET | Freq: Two times a day (BID) | ORAL | 0 refills | Status: DC

## 2017-10-03 MED ORDER — MORPHINE SULFATE 15 MG PO TABS: 15.0000 mg | ORAL_TABLET | Freq: Two times a day (BID) | ORAL | 0 refills | Status: DC | PRN

## 2017-10-03 NOTE — Telephone Encounter (Signed)
Alprazolam is not due for refill until May she should contact pharmacy. Other meds sent in for morphine IR and MS contin

## 2017-10-03 NOTE — Telephone Encounter (Signed)
Patient notified

## 2017-10-04 DIAGNOSIS — J449 Chronic obstructive pulmonary disease, unspecified: Secondary | ICD-10-CM | POA: Diagnosis not present

## 2017-10-10 ENCOUNTER — Ambulatory Visit (INDEPENDENT_AMBULATORY_CARE_PROVIDER_SITE_OTHER): Payer: Medicare Other | Admitting: Podiatry

## 2017-10-10 ENCOUNTER — Encounter: Payer: Self-pay | Admitting: Podiatry

## 2017-10-10 VITALS — BP 115/75 | HR 98 | Ht 67.0 in | Wt 100.0 lb

## 2017-10-10 DIAGNOSIS — M79674 Pain in right toe(s): Secondary | ICD-10-CM

## 2017-10-10 DIAGNOSIS — B351 Tinea unguium: Secondary | ICD-10-CM | POA: Diagnosis not present

## 2017-10-10 DIAGNOSIS — M79675 Pain in left toe(s): Secondary | ICD-10-CM | POA: Diagnosis not present

## 2017-10-10 NOTE — Progress Notes (Signed)
Subjective:    Patient ID: [name], female    DOB: 10-Aug-1960, 57 y.o.   MRN: [id number]  HPI  Chief Complaint  Patient presents with  . Nail Problem    Bilateral elongated thickened toenails - Right great toe is the worst and very painful   57 year old female presents the office today for concerns of thick, elongated toenails that she cannot trim herself.  She feels that the right big toe is very thick and may need to come off but she does not want to have an injection.  She states her nails are painful with pressure in shoes.  Denies any redness or drainage or any swelling.  She has no other concerns today.  Review of Systems  All other systems reviewed and are negative.  Past Medical History:  Diagnosis Date  . Anxiety   . Arthritis   . Cervical cancer ([place])    Discovered by pap smear, treated by Dr. Charlesetta Garibaldi with LEEP procedure.  Pap since that time has been normal.  . Chronic back pain     her CT of the head without contrast June 28, 2008- moderate to large right paracentral disc protrusion at C4-C5 resulting in a right C5 neuroforaminal stenosis and probably some degree of spinal stenosis. C. bilateral C6 neuroforaminal stenosis related to  degenerative hypertrophy, no acute fracture or loose pieces identifying the cervical spine, ligamentous injury is not excluded.    Marland Kitchen COPD (chronic obstructive pulmonary disease) ([place])   . Depression   . Fibromyalgia   . GERD (gastroesophageal reflux disease)  October 2011    [facility], esophagogram - tiny amount of gastroesophageal reflux otherwise unremarkable exam, done by Dr. Dellis Filbert  . Headache(784.0)   . Hemorrhoids   . Osteoporosis    T score L femur neck = -2.6 (02/21/12)    Past Surgical History:  Procedure Laterality Date  . CERVICAL CONE BIOPSY     leep procedure  . MULTIPLE EXTRACTIONS WITH ALVEOLOPLASTY  06/18/2012   Procedure: MULTIPLE EXTRACION WITH ALVEOLOPLASTY;  Surgeon: Gae Bon, DDS;  Location: [facility];  Service: Oral Surgery;  Laterality: Bilateral;     Current Outpatient Medications:  .  ALPRAZolam (XANAX) 1 MG tablet, TAKE 1 TABLET BY MOUTH 4 TIMES A DAY AS NEEDED FOR ANXIETY, Disp: 120 tablet, Rfl: 3 .  bisacodyl (DULCOLAX) 5 MG EC tablet, Take 5 mg by mouth daily as needed for mild constipation or moderate constipation., Disp: , Rfl:  .  esomeprazole (NEXIUM) 40 MG capsule, TAKE ONE CAPSULE BY MOUTH TWICE A DAY BEFORE A MEAL, Disp: 60 capsule, Rfl: 1 .  ipratropium-albuterol (DUONEB) 0.5-2.5 (3) MG/3ML SOLN, Take 3 mLs by nebulization every 6 (six) hours as needed., Disp: 360 mL, Rfl: 3 .  lactose free nutrition (BOOST) LIQD, Take 237 mLs by mouth 2 (two) times daily. Vanilla, Disp: , Rfl:  .  megestrol (MEGACE ES) 625 MG/5ML suspension, TAKE 5 MLS (625 MG TOTAL) BY MOUTH DAILY., Disp: 150 mL, Rfl: 0 .  morphine (MS CONTIN) 100 MG 12 hr tablet, Take 1 tablet (100 mg total) by mouth every 12 (twelve) hours., Disp: 60 tablet, Rfl: 0 .  morphine (MSIR) 15 MG tablet, Take 1 tablet (15 mg total) by mouth 2 (two) times daily as needed for severe pain., Disp: 60 tablet, Rfl: 0 .  OXYGEN, Inhale 3 L/min into the lungs as needed., Disp: , Rfl:  .  polyethylene glycol powder (GLYCOLAX/MIRALAX) powder, TAKE 1 CAPFUL EVERY DAY, Disp:  255 g, Rfl: 1 .  promethazine (PHENERGAN) 25 MG tablet, TAKE 1 TABLET (25 MG TOTAL) BY MOUTH EVERY 8 (EIGHT) HOURS AS NEEDED FOR NAUSEA OR VOMITING., Disp: 60 tablet, Rfl: 0 .  Respiratory Therapy Supplies (FLUTTER) DEVI, USE AS DIRECTED, Disp: 1 each, Rfl: 0 .  SPIRIVA HANDIHALER 18 MCG inhalation capsule, PLACE 1 CAPSULE INTO INHALER AND INHALE DAILY, Disp: 30 capsule, Rfl: 10 .  SYMBICORT 160-4.5 MCG/ACT inhaler, TAKE 2 PUFFS BY MOUTH TWICE A DAY, Disp: 10.2 Inhaler, Rfl: 8 .  Tiotropium Bromide Monohydrate (SPIRIVA RESPIMAT) 2.5 MCG/ACT AERS, Inhale 2 puffs into the lungs daily for 1 day., Disp: 1 Inhaler, Rfl: 0 .  Tiotropium Bromide Monohydrate (SPIRIVA RESPIMAT)  2.5 MCG/ACT AERS, Inhale 2 puffs into the lungs daily., Disp: 1 Inhaler, Rfl: 6  Allergies  Allergen Reactions  . Aripiprazole Other (See Comments)    Suicidal ideation, also with SSRI  . Chantix [Varenicline] Other (See Comments)    Irritability and depressed mood  . Pregabalin Hives    On trunk of body  . Zolpidem Tartrate Other (See Comments)    Unknown- pt states she does not know about this allergy     Social History   Socioeconomic History  . Marital status: Divorced    Spouse name: Not on file  . Number of children: 1  . Years of education: Not on file  . Highest education level: Not on file  Social Needs  . Financial resource strain: Not on file  . Food insecurity - worry: Not on file  . Food insecurity - inability: Not on file  . Transportation needs - medical: Not on file  . Transportation needs - non-medical: Not on file  Occupational History  . Occupation: disabled  Tobacco Use  . Smoking status: Current Every Day Smoker    Packs/day: 1.00    Years: 40.00    Pack years: 40.00    Types: Cigarettes  . Smokeless tobacco: Never Used  . Tobacco comment: hx drugs non since 95/ TRYING TO QUITS/ HAS CUT BACK  Substance and Sexual Activity  . Alcohol use: No    Alcohol/week: 0.0 oz  . Drug use: No  . Sexual activity: Not Currently    Birth control/protection: None  Other Topics Concern  . Not on file  Social History Narrative   10th grade education. Married then divorced mid 85's. One daughter. Crack cocaine about 1995 ish. One Mollie [place] admit 2001 for SI. Deemed disabled 04/01/2004 By [employer] due to documented health and mental issues.        Objective:   Physical Exam  General: AAO x3, NAD  Dermatological: Nails are hypertrophic, dystrophic, brittle, discolored, elongated 10. The right hallux toenail is very thickened discolored. No surrounding redness or drainage. Tenderness nails 1-5 bilaterally. No open lesions or pre-ulcerative lesions are identified  today.  Vascular: Dorsalis Pedis artery and Posterior Tibial artery pedal pulses are 2/4 bilateral with immedate capillary fill time.  There is no pain with calf compression, swelling, warmth, erythema.   Neruologic: Grossly intact via light touch bilateral. Protective threshold with Semmes Wienstein monofilament intact to all pedal sites bilateral.   Musculoskeletal: No gross boney pedal deformities bilateral. No pain, crepitus, or limitation noted with foot and ankle range of motion bilateral. Muscular strength 5/5 in all groups tested bilateral.     Assessment & Plan:  57 year old female with symptomatic onychomycosis -Treatment options discussed including all alternatives, risks, and complications -Etiology of symptoms were discussed -Nails debrided  10 without complications or bleeding.  I was able to debride the right hallux toenail today without any complications or bleeding the patient comfort and there is significant improvement of pain after debridement. -Daily foot inspection -Follow-up in 3 months or sooner if any problems arise. In the meantime, encouraged to call the office with any questions, concerns, change in symptoms.   Celesta Gentile, DPM

## 2017-10-29 DIAGNOSIS — J449 Chronic obstructive pulmonary disease, unspecified: Secondary | ICD-10-CM | POA: Diagnosis not present

## 2017-11-01 ENCOUNTER — Encounter: Payer: Self-pay | Admitting: Internal Medicine

## 2017-11-01 ENCOUNTER — Ambulatory Visit (INDEPENDENT_AMBULATORY_CARE_PROVIDER_SITE_OTHER): Payer: Medicare Other | Admitting: Internal Medicine

## 2017-11-01 VITALS — BP 110/80 | HR 110 | Temp 98.6°F | Ht 67.0 in | Wt 100.0 lb

## 2017-11-01 DIAGNOSIS — F1120 Opioid dependence, uncomplicated: Secondary | ICD-10-CM | POA: Diagnosis not present

## 2017-11-01 DIAGNOSIS — M542 Cervicalgia: Secondary | ICD-10-CM

## 2017-11-01 DIAGNOSIS — R52 Pain, unspecified: Secondary | ICD-10-CM

## 2017-11-01 MED ORDER — MORPHINE SULFATE ER 100 MG PO TBCR: 100.0000 mg | EXTENDED_RELEASE_TABLET | Freq: Two times a day (BID) | ORAL | 0 refills | Status: DC

## 2017-11-01 MED ORDER — ALPRAZOLAM 1 MG PO TABS: ORAL_TABLET | ORAL | 3 refills | Status: DC

## 2017-11-01 MED ORDER — MORPHINE SULFATE 15 MG PO TABS: 15.0000 mg | ORAL_TABLET | Freq: Two times a day (BID) | ORAL | 0 refills | Status: DC | PRN

## 2017-11-01 NOTE — Assessment & Plan Note (Signed)
Taking morphine ER 100 mg BID and morphine IR 15 mg BID for pain. This has allowed her to do ADLs and be functional. We have discussed the risks and benefits and contract signed within the last year. She denies side effects which bother her. Denies taking too much. Continue for now.

## 2017-11-01 NOTE — Patient Instructions (Signed)
We have gotten the refills sent in today.

## 2017-11-01 NOTE — Assessment & Plan Note (Signed)
She is reminded about the potential risk and harm from opioid therapy. [place] narcotic database reviewed and appropriate. No new side effects. She wishes to continue for now.
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
## 2017-11-01 NOTE — Assessment & Plan Note (Signed)
[employer] narcotic database reviewed and appropriate. Taking morphine ER 100 mg BID and IR 15 mg BID. Last UDS fall 2018 which was appropriate except THC which she admits to taking to augment pain relief and previously agreed upon. This lets her do cooking, cleaning and bathing.

## 2017-11-02 ENCOUNTER — Ambulatory Visit: Payer: Medicare Other | Admitting: Internal Medicine

## 2017-11-04 DIAGNOSIS — J449 Chronic obstructive pulmonary disease, unspecified: Secondary | ICD-10-CM | POA: Diagnosis not present

## 2017-11-09 ENCOUNTER — Ambulatory Visit: Payer: Medicare Other | Admitting: Pulmonary Disease

## 2017-11-27 ENCOUNTER — Telehealth: Payer: Self-pay | Admitting: Internal Medicine

## 2017-11-27 MED ORDER — MORPHINE SULFATE ER 100 MG PO TBCR: 100.0000 mg | EXTENDED_RELEASE_TABLET | Freq: Two times a day (BID) | ORAL | 0 refills | Status: DC

## 2017-11-27 MED ORDER — MORPHINE SULFATE 15 MG PO TABS: 15.0000 mg | ORAL_TABLET | Freq: Two times a day (BID) | ORAL | 0 refills | Status: DC | PRN

## 2017-11-27 NOTE — Telephone Encounter (Signed)
Copied from [place] [phone]. Topic: Quick Communication - Rx Refill/Question >> Nov 27, 2017 12:26 PM Lennox Solders wrote: Medication: morphine 15 mg and 100 mg Has the patient contacted their pharmacy?  No Preferred Pharmacy  [facility] Agent: Please be advised that RX refills may take up to 3 business days. We ask that you follow-up with your pharmacy.

## 2017-11-27 NOTE — Telephone Encounter (Signed)
Sent in but not due for fill until 12/03/17

## 2017-11-28 DIAGNOSIS — H10413 Chronic giant papillary conjunctivitis, bilateral: Secondary | ICD-10-CM | POA: Diagnosis not present

## 2017-11-28 DIAGNOSIS — H04123 Dry eye syndrome of bilateral lacrimal glands: Secondary | ICD-10-CM | POA: Diagnosis not present

## 2017-11-28 DIAGNOSIS — Z961 Presence of intraocular lens: Secondary | ICD-10-CM | POA: Diagnosis not present

## 2017-11-28 DIAGNOSIS — H43811 Vitreous degeneration, right eye: Secondary | ICD-10-CM | POA: Diagnosis not present

## 2017-11-28 DIAGNOSIS — J449 Chronic obstructive pulmonary disease, unspecified: Secondary | ICD-10-CM | POA: Diagnosis not present

## 2017-12-04 ENCOUNTER — Other Ambulatory Visit: Payer: Self-pay | Admitting: Internal Medicine

## 2017-12-04 DIAGNOSIS — J449 Chronic obstructive pulmonary disease, unspecified: Secondary | ICD-10-CM | POA: Diagnosis not present

## 2017-12-11 ENCOUNTER — Ambulatory Visit: Payer: Self-pay

## 2017-12-11 NOTE — Telephone Encounter (Signed)
Pt.  Called in with multiple complaints.c/o  SOB,  " feels like there is something hung up in my throat"  Body feels achy from head to toe I have been having these sx for the past 3 days.  I drink the Boost but it doesn't help.  I feel weak and tired.  I feel weak and achy. Im using all my energy to breath.  Just feel weaker since the past 3 days.   Pt. States she is on 3.5 O2 liters. Still continues to smoke.  Weakness interferes with daily activites. Advised pt. Go to Urgent Care or ER. Pt. Voiced understanding. Reason for Disposition . [1] MILD difficulty breathing (e.g., minimal/no SOB at rest, SOB with walking, pulse <100) AND [2] NEW-onset or WORSE than normal . [1] MODERATE weakness (i.e., interferes with work, school, normal activities) AND [2] cause unknown  (Exceptions: weakness with acute minor illness, or weakness from poor fluid intake)  Answer Assessment - Initial Assessment Questions 1. RESPIRATORY STATUS: "Describe your breathing?" (e.g., wheezing, shortness of breath, unable to speak, severe coughing)    State wheezing  not bad, coughing non  productive.   2. ONSET: "When did this breathing problem begin?"  Started 3 days ago     3. PATTERN "Does the difficult breathing come and go, or has it been constant since it started?"      constant 4. SEVERITY: "How bad is your breathing?" (e.g., mild, moderate, severe)    - MILD: No SOB at rest, mild SOB with walking, speaks normally in sentences, can lay down, no retractions, pulse < 100. mild   - MODERATE: SOB at rest, SOB with minimal exertion and prefers to sit, cannot lie down flat, speaks in phrases, mild retractions, audible wheezing, pulse 100-120. -  SEVERE: Very SOB at rest, speaks in single words, struggling to breathe, sitting hunched forward, retractions, pulse > 120 " hard to swallow saliva. Feels like I am going to get choked."     Speaking in sentences, but c/o sob when resting  5. RECURRENT SYMPTOM: "Have you had difficulty  breathing before?" If so, ask: "When was the last time?" and "What happened that time?"      I have COPD 6. CARDIAC HISTORY: "Do you have any history of heart disease?" (e.g., heart attack, angina, bypass surgery, angioplasty) no     *No Answer*no 7. LUNG HISTORY: "Do you have any history of lung disease?"  (e.g., pulmonary embolus, asthma, emphysema)COPD     *No Answer* 8. CAUSE: "What do you think is causing the breathing problem?" no I have always  Felt like this. I ache in my legs always.      *No Answer* 9. OTHER SYMPTOMS: "Do you have any other symptoms? (e.g., dizziness, runny nose, cough, chest pain, fever) I have a bad cough .      10. PREGNANCY: "Is there any chance you are pregnant?" "When was your last menstrual period?"      n/a 11. TRAVEL: "Have you traveled out of the country in the last month?" (e.g., travel history, exposures)       no  Answer Assessment - Initial Assessment Questions 1. DESCRIPTION: "Describe how you are feeling."     weakness 2. SEVERITY: "How bad is it?"  "Can you stand and walk?"   - MILD - Feels weak or tired, but does not interfere with work, school or normal activities moderate   - MODERATE - Able to stand and walk; weakness interferes with work, school,  or normal activities   - SEVERE - Unable to stand or walk     *No Answer*modera3. ONSET:  "When did the weakness begin?"     *No Answer* if I start doing  activities onset last 3days 4. CAUSE: "What do you think is causing the weakness?"     " I have no clue" smoking O2 is on 3.5 liters 5. MEDICINES: "Have you recently started a new medicine or had a change in the amount of a medicine?"no    *No Answer* 6. OTHER SYMPTOMS: "Do you have any other symptoms?" (e.g., chest pain, fever, cough, SOB, vomiting, diarrhea, bleeding)     No fever just tired weak  Achy from neck to feet.   7. PREGNANCY: "Is there any chance you are pregnant?" "When was your last menstrual period?"     n/a  Protocols used:  BREATHING DIFFICULTY-A-AH, WEAKNESS (GENERALIZED) AND FATIGUE-A-AH

## 2017-12-15 ENCOUNTER — Encounter (HOSPITAL_COMMUNITY): Payer: Self-pay | Admitting: Emergency Medicine

## 2017-12-15 ENCOUNTER — Emergency Department (HOSPITAL_COMMUNITY)
Admission: EM | Admit: 2017-12-15 | Discharge: 2017-12-15 | Discharge status: Against Medical Advice | Payer: Medicare Other | Attending: Emergency Medicine | Admitting: Emergency Medicine

## 2017-12-15 ENCOUNTER — Emergency Department (HOSPITAL_COMMUNITY): Payer: Medicare Other

## 2017-12-15 DIAGNOSIS — F419 Anxiety disorder, unspecified: Secondary | ICD-10-CM | POA: Insufficient documentation

## 2017-12-15 DIAGNOSIS — Z8541 Personal history of malignant neoplasm of cervix uteri: Secondary | ICD-10-CM | POA: Diagnosis not present

## 2017-12-15 DIAGNOSIS — F329 Major depressive disorder, single episode, unspecified: Secondary | ICD-10-CM | POA: Insufficient documentation

## 2017-12-15 DIAGNOSIS — Z79899 Other long term (current) drug therapy: Secondary | ICD-10-CM | POA: Diagnosis not present

## 2017-12-15 DIAGNOSIS — F17210 Nicotine dependence, cigarettes, uncomplicated: Secondary | ICD-10-CM | POA: Diagnosis not present

## 2017-12-15 DIAGNOSIS — J441 Chronic obstructive pulmonary disease with (acute) exacerbation: Secondary | ICD-10-CM | POA: Insufficient documentation

## 2017-12-15 DIAGNOSIS — R071 Chest pain on breathing: Secondary | ICD-10-CM | POA: Diagnosis not present

## 2017-12-15 DIAGNOSIS — R05 Cough: Secondary | ICD-10-CM | POA: Diagnosis not present

## 2017-12-15 DIAGNOSIS — R0602 Shortness of breath: Secondary | ICD-10-CM | POA: Diagnosis not present

## 2017-12-15 LAB — CBC WITH DIFFERENTIAL/PLATELET
ABS IMMATURE GRANULOCYTES: 0 10*3/uL (ref 0.0–0.1)
Basophils Absolute: 0 10*3/uL (ref 0.0–0.1)
Basophils Relative: 1 %
EOS PCT: 2 %
Eosinophils Absolute: 0.1 10*3/uL (ref 0.0–0.7)
HEMATOCRIT: 41 % (ref 36.0–46.0)
HEMOGLOBIN: 13.1 g/dL (ref 12.0–15.0)
Immature Granulocytes: 0 %
LYMPHS ABS: 2 10*3/uL (ref 0.7–4.0)
LYMPHS PCT: 36 %
MCH: 30.9 pg (ref 26.0–34.0)
MCHC: 32 g/dL (ref 30.0–36.0)
MCV: 96.7 fL (ref 78.0–100.0)
MONO ABS: 0.5 10*3/uL (ref 0.1–1.0)
MONOS PCT: 9 %
NEUTROS ABS: 2.9 10*3/uL (ref 1.7–7.7)
Neutrophils Relative %: 52 %
Platelets: 371 10*3/uL (ref 150–400)
RBC: 4.24 MIL/uL (ref 3.87–5.11)
RDW: 13.7 % (ref 11.5–15.5)
WBC: 5.5 10*3/uL (ref 4.0–10.5)

## 2017-12-15 LAB — COMPREHENSIVE METABOLIC PANEL
ALBUMIN: 3.6 g/dL (ref 3.5–5.0)
ALT: 10 U/L — ABNORMAL LOW (ref 14–54)
AST: 16 U/L (ref 15–41)
Alkaline Phosphatase: 64 U/L (ref 38–126)
Anion gap: 6 (ref 5–15)
BILIRUBIN TOTAL: 0.5 mg/dL (ref 0.3–1.2)
BUN: 10 mg/dL (ref 6–20)
CHLORIDE: 104 mmol/L (ref 101–111)
CO2: 31 mmol/L (ref 22–32)
CREATININE: 0.74 mg/dL (ref 0.44–1.00)
Calcium: 9.2 mg/dL (ref 8.9–10.3)
GFR calc Af Amer: >60 mL/min (ref >60)
GFR calc non Af Amer: >60 mL/min (ref >60)
GLUCOSE: 93 mg/dL (ref 65–99)
POTASSIUM: 4.2 mmol/L (ref 3.5–5.1)
Sodium: 141 mmol/L (ref 135–145)
Total Protein: 6.7 g/dL (ref 6.5–8.1)

## 2017-12-15 LAB — CK: CK TOTAL: 91 U/L (ref 38–234)

## 2017-12-15 LAB — LIPASE, BLOOD: Lipase: 22 U/L (ref 11–51)

## 2017-12-15 LAB — I-STAT TROPONIN, ED: TROPONIN I, POC: 0 ng/mL (ref 0.00–0.08)

## 2017-12-15 LAB — D-DIMER, QUANTITATIVE: D-Dimer, Quant: 0.73 ug/mL-FEU — ABNORMAL HIGH (ref 0.00–0.50)

## 2017-12-15 MED ORDER — ALBUTEROL SULFATE (2.5 MG/3ML) 0.083% IN NEBU
5.0000 mg | INHALATION_SOLUTION | Freq: Once | RESPIRATORY_TRACT | Status: AC
Start: 2017-12-15 — End: 2017-12-15
  Administered 2017-12-15: 5 mg via RESPIRATORY_TRACT
  Filled 2017-12-15: qty 6

## 2017-12-15 MED ORDER — SODIUM CHLORIDE 0.9 % IV BOLUS
500.0000 mL | Freq: Once | INTRAVENOUS | Status: AC
  Administered 2017-12-15: 500 mL via INTRAVENOUS

## 2017-12-15 MED ORDER — PREDNISONE 20 MG PO TABS
40.0000 mg | ORAL_TABLET | Freq: Once | ORAL | Status: AC
  Administered 2017-12-15: 40 mg via ORAL
  Filled 2017-12-15: qty 2

## 2017-12-15 MED ORDER — IPRATROPIUM BROMIDE 0.02 % IN SOLN
0.5000 mg | Freq: Once | RESPIRATORY_TRACT | Status: AC
  Administered 2017-12-15: 0.5 mg via RESPIRATORY_TRACT
  Filled 2017-12-15: qty 2.5

## 2017-12-15 MED ORDER — MORPHINE SULFATE (PF) 4 MG/ML IV SOLN
4.0000 mg | Freq: Once | INTRAVENOUS | Status: AC
  Administered 2017-12-15: 4 mg via INTRAVENOUS
  Filled 2017-12-15: qty 1

## 2017-12-15 MED ORDER — PREDNISONE 20 MG PO TABS: ORAL_TABLET | ORAL | 0 refills | Status: DC

## 2017-12-15 NOTE — ED Notes (Addendum)
Pt discharged from ED AMA; risks discussed; instructions provided and scripts given; Pt encouraged to return to ED if symptoms worsen and to f/u with PCP; Pt verbalized understanding of all instructions

## 2017-12-15 NOTE — ED Triage Notes (Signed)
Patient arrived with EMS from home with NRM-6lit. She reports shortness of breath X 3 days after mowing her lawn 3lit Learned at home Current smoker 3 lit [place] at home.

## 2017-12-15 NOTE — Discharge Instructions (Signed)
You are leaving [place].  We discussed wanting to do a CT scan to rule out blood clot or other acute serious pathology in your lungs.  You have declined this.  We discussed that this could result in worsening lung injury, heart injury, or death.  You are probably suffering from a COPD exacerbation and need to use your albuterol nebulizer every 4 hours for the next couple days while you are feeling short of breath.  You need to follow-up with your primary care doctor on 5/20.  If you develop any worsening or new symptoms you are welcome to and encouraged to return at any time.

## 2017-12-15 NOTE — ED Provider Notes (Signed)
[facility] EMERGENCY DEPARTMENT Provider Note   CSN: [id number] Arrival date & time: 12/15/17  1257     History   Chief Complaint Chief Complaint  Patient presents with  . Shortness of Breath    HPI [name] is a 57 y.o. female.  HPI  57 year old female with a history of COPD on chronic home oxygen presents with shortness of breath and chest pain.  She states this is been ongoing for 3 days.  It seemed much worse after she mowed the lawn.  She is not sure if allergies are contributing.  She is been having on and off chest pain during this time but she describes as like a pressure.  She has not had any fevers.  She states she vomited once on the first day but none since.  She denies leg swelling.  She is been having diffuse pain in her legs and arms, feeling like they are "drawing up".  She has chronic back pain but no  new back pain.  When asked if this feels like COPD to her, she states she thinks so.  She has tried albuterol at home with no relief.  She has been increasing her oxygen from her normal 3 L up to 4 L.  Past Medical History:  Diagnosis Date  . Anxiety   . Arthritis   . Cervical cancer ([place])    Discovered by pap smear, treated by Dr. Charlesetta Garibaldi with LEEP procedure.  Pap since that time has been normal.  . Chronic back pain     her CT of the head without contrast June 28, 2008- moderate to large right paracentral disc protrusion at C4-C5 resulting in a right C5 neuroforaminal stenosis and probably some degree of spinal stenosis. C. bilateral C6 neuroforaminal stenosis related to  degenerative hypertrophy, no acute fracture or loose pieces identifying the cervical spine, ligamentous injury is not excluded.    Marland Kitchen COPD (chronic obstructive pulmonary disease) ([place])   . Depression   . Fibromyalgia   . GERD (gastroesophageal reflux disease)  October 2011    [place], esophagogram - tiny amount of gastroesophageal reflux otherwise unremarkable exam,  done by Dr. Dellis Filbert  . Headache(784.0)   . Hemorrhoids   . Osteoporosis    T score L femur neck = -2.6 (02/21/12)    Patient Active Problem List   Diagnosis Date Noted  . Encounter for pain management 11/01/2017  . Pain management contract signed 08/11/2017  . B12 deficiency 07/16/2015  . Abdominal pain 07/03/2015  . Cachexia ([place]) 07/03/2015  . Protein-calorie malnutrition, severe ([place]) 05/30/2014  . Alopecia 10/30/2013  . Osteoporosis 03/11/2012  . Tobacco abuse 12/08/2011  . Weight loss 12/08/2011  . Preventative health care 10/19/2011  . Narcotic dependence ([place]) 09/22/2011  . GERD 01/08/2009  . Neck pain 07/01/2008  . Multiple vitamin deficiency (Vit D) and h/o B12 deficiency 02/28/2007  . Depression with anxiety 02/27/2007  . COPD, severe ([place]) 02/27/2007    Past Surgical History:  Procedure Laterality Date  . CERVICAL CONE BIOPSY     leep procedure  . MULTIPLE EXTRACTIONS WITH ALVEOLOPLASTY  06/18/2012   Procedure: MULTIPLE EXTRACION WITH ALVEOLOPLASTY;  Surgeon: Gae Bon, DDS;  Location: [facility];  Service: Oral Surgery;  Laterality: Bilateral;     OB History    Gravida  1   Para  1   Term  1   Preterm      AB      Living  2  SAB      TAB      Ectopic      Multiple      Live Births  1            Home Medications    Prior to Admission medications   Medication Sig Start Date End Date Taking? Authorizing Provider  ALPRAZolam Duanne Moron) 1 MG tablet TAKE 1 TABLET BY MOUTH 4 TIMES A DAY AS NEEDED FOR ANXIETY 11/01/17   Hoyt Koch, MD  bisacodyl (DULCOLAX) 5 MG EC tablet Take 5 mg by mouth daily as needed for mild constipation or moderate constipation.    [provider]  esomeprazole (NEXIUM) 40 MG capsule TAKE ONE CAPSULE BY MOUTH TWICE A DAY BEFORE A MEAL 08/16/17   Hoyt Koch, MD  ipratropium-albuterol (DUONEB) 0.5-2.5 (3) MG/3ML SOLN Take 3 mLs by nebulization every 6 (six) hours as needed. 08/31/17   Mannam,  Hart Robinsons, MD  lactose free nutrition (BOOST) LIQD Take 237 mLs by mouth 2 (two) times daily. Vanilla    [provider]  megestrol (MEGACE ES) 625 MG/5ML suspension TAKE 5 MLS (625 MG TOTAL) BY MOUTH DAILY. 03/21/17   Hoyt Koch, MD  morphine (MS CONTIN) 100 MG 12 hr tablet Take 1 tablet (100 mg total) by mouth every 12 (twelve) hours. 12/03/17   Hoyt Koch, MD  morphine (MSIR) 15 MG tablet Take 1 tablet (15 mg total) by mouth 2 (two) times daily as needed for severe pain. 12/03/17   Hoyt Koch, MD  OXYGEN Inhale 3 L/min into the lungs as needed.    [provider]  polyethylene glycol powder (GLYCOLAX/MIRALAX) powder TAKE 1 CAPFUL EVERY DAY 08/14/17   Hoyt Koch, MD  predniSONE (DELTASONE) 20 MG tablet 2 tabs po daily x 4 days 12/16/17   Sherwood Gambler, MD  promethazine (PHENERGAN) 25 MG tablet TAKE 1 TABLET (25 MG TOTAL) BY MOUTH EVERY 8 (EIGHT) HOURS AS NEEDED FOR NAUSEA OR VOMITING. 12/05/17   Hoyt Koch, MD  Respiratory Therapy Supplies (FLUTTER) DEVI USE AS DIRECTED 08/31/17   Marshell Garfinkel, MD  SPIRIVA HANDIHALER 18 MCG inhalation capsule PLACE 1 CAPSULE INTO INHALER AND INHALE DAILY 08/16/17   Hoyt Koch, MD  SYMBICORT 160-4.5 MCG/ACT inhaler TAKE 2 PUFFS BY MOUTH TWICE A DAY 08/16/17   Hoyt Koch, MD  Tiotropium Bromide Monohydrate (SPIRIVA RESPIMAT) 2.5 MCG/ACT AERS Inhale 2 puffs into the lungs daily for 1 day. 08/31/17 09/01/17  Marshell Garfinkel, MD  Tiotropium Bromide Monohydrate (SPIRIVA RESPIMAT) 2.5 MCG/ACT AERS Inhale 2 puffs into the lungs daily. 08/31/17   Marshell Garfinkel, MD    Family History Family History  Problem Relation Age of Onset  . Stroke Mother   . Diabetes Mother   . Heart disease Father   . Lung cancer Brother        smoker    Social History Social History   Tobacco Use  . Smoking status: Current Every Day Smoker    Packs/day: 1.00    Years: 40.00    Pack years: 40.00     Types: Cigarettes  . Smokeless tobacco: Never Used  . Tobacco comment: hx drugs non since 95/ TRYING TO QUITS/ HAS CUT BACK  Substance Use Topics  . Alcohol use: No    Alcohol/week: 0.0 oz  . Drug use: No     Allergies   Aripiprazole; Chantix [varenicline]; Pregabalin; and Zolpidem tartrate   Review of Systems Review of Systems  Constitutional:  Negative for fever.  Respiratory: Positive for cough (nonproductive) and shortness of breath.   Cardiovascular: Positive for chest pain.  Gastrointestinal: Positive for vomiting (once). Negative for abdominal pain.  Musculoskeletal: Positive for back pain (chronic) and myalgias (diffuse).  All other systems reviewed and are negative.    Physical Exam Updated Vital Signs BP (!) 126/94   Pulse (!) 103   Temp 98 F (36.7 C) (Oral)   Resp 14   LMP 09/29/2003   SpO2 99%   Physical Exam  Constitutional: She is oriented to person, place, and time. She appears well-developed and well-nourished.  Chronic ill appearance  HENT:  Head: Normocephalic and atraumatic.  Right Ear: External ear normal.  Left Ear: External ear normal.  Nose: Nose normal.  Eyes: Right eye exhibits no discharge. Left eye exhibits no discharge.  Cardiovascular: Normal rate, regular rhythm and normal heart sounds.  Pulmonary/Chest: Effort normal. No accessory muscle usage. She has decreased breath sounds (mild, diffuse). She has no wheezes.  Abdominal: Soft. There is tenderness.    Musculoskeletal:       Right lower leg: She exhibits no edema.       Left lower leg: She exhibits no edema.  Neurological: She is alert and oriented to person, place, and time.  Skin: Skin is warm and dry.  Nursing note and vitals reviewed.    ED Treatments / Results  Labs (all labs ordered are listed, but only abnormal results are displayed) Labs Reviewed  COMPREHENSIVE METABOLIC PANEL - Abnormal; Notable for the following components:      Result Value   ALT 10 (*)    All  other components within normal limits  D-DIMER, QUANTITATIVE (NOT AT [facility]) - Abnormal; Notable for the following components:   D-Dimer, Quant 0.73 (*)    All other components within normal limits  LIPASE, BLOOD  CBC WITH DIFFERENTIAL/PLATELET  CK  I-STAT TROPONIN, ED    EKG EKG Interpretation  Date/Time:  Friday Dec 15 2017 18:03:23 EDT Ventricular Rate:  81 PR Interval:    QRS Duration: 75 QT Interval:  381 QTC Calculation: 443 R Axis:   91 Text Interpretation:  Normal sinus rhythm Borderline short PR interval Borderline right axis deviation no acute ST/T changes no significant change since Dec 2018 Confirmed by Sherwood Gambler [phone]) on 12/15/2017 6:07:23 PM   Radiology Dg Chest 2 View  Result Date: 12/15/2017 CLINICAL DATA:  Shortness of breath and cough. EXAM: CHEST - 2 VIEW COMPARISON:  07/15/2017 and prior radiographs FINDINGS: The cardiomediastinal silhouette is unremarkable. COPD/emphysema changes again noted. There is no evidence of focal airspace disease, pulmonary edema, suspicious pulmonary nodule/mass, pleural effusion, or pneumothorax. No acute bony abnormalities are identified. IMPRESSION: COPD/emphysema changes without evidence of acute cardiopulmonary disease. Electronically Signed   By: Margarette Canada M.D.   On: 12/15/2017 17:19    Procedures Procedures (including critical care time)  Medications Ordered in ED Medications  sodium chloride 0.9 % bolus 500 mL (0 mLs Intravenous Stopped 12/15/17 1734)  albuterol (PROVENTIL) (2.5 MG/3ML) 0.083% nebulizer solution 5 mg (5 mg Nebulization Given 12/15/17 1621)  ipratropium (ATROVENT) nebulizer solution 0.5 mg (0.5 mg Nebulization Given 12/15/17 1621)  predniSONE (DELTASONE) tablet 40 mg (40 mg Oral Given 12/15/17 1634)  morphine 4 MG/ML injection 4 mg (4 mg Intravenous Given 12/15/17 1636)     Initial Impression / Assessment and Plan / ED Course  I have reviewed the triage vital signs and the nursing notes.  Pertinent  labs & imaging  results that were available during my care of the patient were reviewed by me and considered in my medical decision making (see chart for details).     The patient does not appear critically ill.  She does appear short of breath with decreased breath sounds.  However without frank wheezing and somewhat  limited history from patient, d-dimer and other labs sent in addition to chest x-ray and breathing treatment.  She is feeling much better after breathing treatment.  Her air movement is better.  However d-dimer is slightly elevated.  I discussed we should get a CT to help rule out pulmonary embolism which could mimic other diseases.  However she is refusing and wanting to go home.  She seems to understand risk of that a potential PE or other emergent diagnosis found on CT could represent including death.  She appears to have medical decision-making capacity.  Thus she will leave [place].  I have discussed increasing her albuterol at home from twice daily up to every 4 hours as needed for shortness of breath during the acute illness.  She will also be discharged with steroids.  Encouraged to come back at any time.  I doubt this is ACS.  Final Clinical Impressions(s) / ED Diagnoses   Final diagnoses:  COPD exacerbation [facility])    ED Discharge Orders        Ordered    predniSONE (DELTASONE) 20 MG tablet     12/15/17 1812       Sherwood Gambler, MD 12/15/17 [phone]

## 2017-12-18 ENCOUNTER — Ambulatory Visit: Payer: Medicare Other | Admitting: Internal Medicine

## 2017-12-18 ENCOUNTER — Ambulatory Visit (INDEPENDENT_AMBULATORY_CARE_PROVIDER_SITE_OTHER): Payer: Medicare Other

## 2017-12-18 ENCOUNTER — Ambulatory Visit (HOSPITAL_COMMUNITY)
Admission: EM | Admit: 2017-12-18 | Discharge: 2017-12-18 | Disposition: A | Discharge status: Home / Self Care | Payer: Medicare Other | Attending: Family Medicine | Admitting: Family Medicine

## 2017-12-18 ENCOUNTER — Encounter (HOSPITAL_COMMUNITY): Payer: Self-pay | Admitting: Emergency Medicine

## 2017-12-18 DIAGNOSIS — S93602A Unspecified sprain of left foot, initial encounter: Secondary | ICD-10-CM

## 2017-12-18 DIAGNOSIS — M79672 Pain in left foot: Secondary | ICD-10-CM

## 2017-12-18 DIAGNOSIS — Z0289 Encounter for other administrative examinations: Secondary | ICD-10-CM

## 2017-12-18 DIAGNOSIS — M7989 Other specified soft tissue disorders: Secondary | ICD-10-CM | POA: Diagnosis not present
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
## 2017-12-18 NOTE — Discharge Instructions (Addendum)
Please read over the instructions printed in your after visit summary. You may use over the counter ibuprofen or acetaminophen as needed.
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
## 2017-12-26 ENCOUNTER — Other Ambulatory Visit: Payer: Self-pay | Admitting: Internal Medicine

## 2017-12-26 MED ORDER — MORPHINE SULFATE ER 100 MG PO TBCR: 100.0000 mg | EXTENDED_RELEASE_TABLET | Freq: Two times a day (BID) | ORAL | 0 refills | Status: DC

## 2017-12-26 MED ORDER — MORPHINE SULFATE 15 MG PO TABS: 15.0000 mg | ORAL_TABLET | Freq: Two times a day (BID) | ORAL | 0 refills | Status: DC | PRN

## 2017-12-26 NOTE — Telephone Encounter (Signed)
Rx refill request: MS Contin 100 mg    Last filled: 11/27/17 # 60                            Morphine 15 mg         Last filled: 11/27/17 # 60   LOV: 11/01/17  PCP: [place]: verified

## 2017-12-26 NOTE — Telephone Encounter (Signed)
Copied from [place] [id number]. Topic: Quick Communication - Rx Refill/Question >> Dec 26, 2017  9:40 AM Synthia Innocent wrote: Medication: morphine (MS CONTIN) 100 MG 12 hr tablet and morphine (MSIR) 15 MG tablet   Has the patient contacted their pharmacy? No. States pharmacy tells her to contact us (Agent: If no, request that the patient contact the pharmacy for the refill.) (Agent: If yes, when and what did the pharmacy advise?)  [facility]erred [facility] (with phone number [facility] [address]  Agent: Please be advised that RX refills may take up to 3 business days. We ask that you follow-up with your pharmacy.

## 2017-12-26 NOTE — Telephone Encounter (Signed)
Check [place] registry last filled 12/03/2017.Marland KitchenJohny Chess

## 2017-12-29 DIAGNOSIS — J449 Chronic obstructive pulmonary disease, unspecified: Secondary | ICD-10-CM | POA: Diagnosis not present

## 2018-01-04 DIAGNOSIS — J449 Chronic obstructive pulmonary disease, unspecified: Secondary | ICD-10-CM | POA: Diagnosis not present

## 2018-01-12 ENCOUNTER — Ambulatory Visit (INDEPENDENT_AMBULATORY_CARE_PROVIDER_SITE_OTHER): Payer: Medicare Other | Admitting: Internal Medicine

## 2018-01-12 ENCOUNTER — Encounter: Payer: Self-pay | Admitting: Internal Medicine

## 2018-01-12 VITALS — BP 120/70 | HR 105 | Temp 98.0°F | Ht 67.0 in | Wt 101.0 lb

## 2018-01-12 DIAGNOSIS — E538 Deficiency of other specified B group vitamins: Secondary | ICD-10-CM

## 2018-01-12 DIAGNOSIS — K5903 Drug induced constipation: Secondary | ICD-10-CM | POA: Diagnosis not present

## 2018-01-12 DIAGNOSIS — R64 Cachexia: Secondary | ICD-10-CM

## 2018-01-12 DIAGNOSIS — E569 Vitamin deficiency, unspecified: Secondary | ICD-10-CM | POA: Diagnosis not present

## 2018-01-12 DIAGNOSIS — T402X5A Adverse effect of other opioids, initial encounter: Secondary | ICD-10-CM

## 2018-01-12 MED ORDER — CYANOCOBALAMIN 1000 MCG/ML IJ SOLN
1000.0000 ug | Freq: Once | INTRAMUSCULAR | Status: AC
  Administered 2018-01-12: 1000 ug via INTRAMUSCULAR

## 2018-01-12 MED ORDER — METHYLNALTREXONE BROMIDE 150 MG PO TABS: 150.0000 mg | ORAL_TABLET | Freq: Every day | ORAL | 3 refills | Status: DC

## 2018-01-12 NOTE — Assessment & Plan Note (Signed)
B12 shot given at visit. Her diet is poor and she is not able to come to the office monthly. Does not feel able to give shots. Has tried oral replacement in the past and not done well with remembering.

## 2018-01-12 NOTE — Patient Instructions (Signed)
We have sent in relistor to take 1 pill daily for the constipation.   We have given you a b12 shot today and can give you one each time you are here.

## 2018-01-12 NOTE — Assessment & Plan Note (Signed)
Likely related to poor diet. B12 shot given today.

## 2018-01-12 NOTE — Progress Notes (Signed)
   Subjective:    Patient ID: [name], female    DOB: 20-Jul-1961, 57 y.o.   MRN: [id number]  HPI The patient is a 57 YO female coming in for concerns about fatigue (she has had B12 deficiency in the past, no B12 shots in some time, tired all the time, intake of food is poor due to appetite) and constipation (used to take miralax daily which was somewhat effective, her pharmacy will not stock this anymore, she needs something else, she cannot afford to buy otc products, takes opioids and goes about 1 times per week or so, hard stools, straining, tries to drink water, denies hemorrhoids).   Review of Systems  Constitutional: Positive for activity change, appetite change and fatigue.  HENT: Negative.   Eyes: Negative.   Respiratory: Negative for cough, chest tightness and shortness of breath.   Cardiovascular: Negative for chest pain, palpitations and leg swelling.  Gastrointestinal: Positive for abdominal pain and constipation. Negative for abdominal distention, diarrhea, nausea and vomiting.  Musculoskeletal: Positive for arthralgias, back pain and myalgias.  Skin: Negative.   Neurological: Positive for weakness. Negative for dizziness, seizures, light-headedness and numbness.  Psychiatric/Behavioral: Negative.       Objective:   Physical Exam  Constitutional: She is oriented to person, place, and time. She appears well-developed.  thin  HENT:  Head: Normocephalic and atraumatic.  Eyes: EOM are normal.  Neck: Normal range of motion.  Cardiovascular: Normal rate and regular rhythm.  Pulmonary/Chest: Effort normal. No respiratory distress. She has wheezes. She has no rales.  Stable lung exam  Abdominal: Soft. She exhibits no distension. There is no tenderness. There is no rebound.  BS present but sluggish  Musculoskeletal: She exhibits no edema.  Neurological: She is alert and oriented to person, place, and time. Coordination normal.  Skin: Skin is warm and dry.   Vitals:   01/12/18 1017  BP: 120/70  Pulse: (!) 105  Temp: 98 F (36.7 C)  TempSrc: Oral  SpO2: 90%  Weight: 101 lb (45.8 kg)  Height: 5\' 7"  (1.702 m)      Assessment & Plan:  B12 shot given at visit

## 2018-01-12 NOTE — Assessment & Plan Note (Signed)
Weight is stable from prior, no cause identified. She does admit to poor diet and lack of appetite.

## 2018-01-12 NOTE — Assessment & Plan Note (Signed)
Rx for relistor today. She had been taking miralax with moderate success but cannot afford as her pharmacy will not stock anymore.

## 2018-01-17 ENCOUNTER — Telehealth: Payer: Self-pay

## 2018-01-17 NOTE — Telephone Encounter (Signed)
error 

## 2018-01-17 NOTE — Telephone Encounter (Signed)
Pa approved through 07/19/2018

## 2018-01-17 NOTE — Telephone Encounter (Signed)
PA started on CoverMyMeds KEY: [id number]

## 2018-01-28 DIAGNOSIS — J449 Chronic obstructive pulmonary disease, unspecified: Secondary | ICD-10-CM | POA: Diagnosis not present

## 2018-01-29 ENCOUNTER — Other Ambulatory Visit: Payer: Self-pay | Admitting: Internal Medicine

## 2018-01-29 NOTE — Telephone Encounter (Signed)
Copied from [place] [id number]. Topic: Quick Communication - Rx Refill/Question >> Jan 29, 2018  9:40 AM Selinda Flavin B, NT wrote: Medication: morphine (MSIR) 15 MG tablet morphine (MS CONTIN) 100 MG 12 hr tablet  ALPRAZolam (XANAX) 1 MG tablet   Has the patient contacted their pharmacy? Yes.   (Agent: If no, request that the patient contact the pharmacy for the refill.) (Agent: If yes, when and what did the pharmacy advise?)  Preferred Pharmacy (with phone number or street name[id number] [place], [place]: Please be advised that RX refills may take up to 3 business days. We ask that you follow-up with your pharmacy.

## 2018-01-29 NOTE — Telephone Encounter (Signed)
Check [place] registry last filled.alprazolam 12/31/2017 and both morphine filled 01/03/2018.Marland KitchenJohny Chess

## 2018-01-29 NOTE — Telephone Encounter (Signed)
Morphine(MSIR) refill Last Refill:12/26/17 #60  Morphine (MS Contin) 12hr tab refill Last refill: 12/26/17  Aprazolam (Xanax) refill Last refill: 11/01/17 #120 with 3 refills  Last OV: 11/01/17 PCP: Dr. Sharlet Salina [facility] [address]

## 2018-01-30 MED ORDER — MORPHINE SULFATE ER 100 MG PO TBCR: 100.0000 mg | EXTENDED_RELEASE_TABLET | Freq: Two times a day (BID) | ORAL | 0 refills | Status: DC

## 2018-01-30 MED ORDER — MORPHINE SULFATE 15 MG PO TABS: 15.0000 mg | ORAL_TABLET | Freq: Two times a day (BID) | ORAL | 0 refills | Status: DC | PRN

## 2018-01-30 MED ORDER — ALPRAZOLAM 1 MG PO TABS: ORAL_TABLET | ORAL | 3 refills | Status: DC

## 2018-02-03 DIAGNOSIS — J449 Chronic obstructive pulmonary disease, unspecified: Secondary | ICD-10-CM | POA: Diagnosis not present

## 2018-02-06 ENCOUNTER — Other Ambulatory Visit: Payer: Self-pay | Admitting: Internal Medicine

## 2018-02-12 ENCOUNTER — Telehealth: Payer: Self-pay | Admitting: Internal Medicine

## 2018-02-12 DIAGNOSIS — R1084 Generalized abdominal pain: Secondary | ICD-10-CM

## 2018-02-12 NOTE — Telephone Encounter (Signed)
Copied from [place] [phone]. Topic: Referral - Request >> Feb 12, 2018  9:43 AM Burchel, Abbi R wrote: Reason for CRM:   Pt is requesting a referral to GI (Dr Maygard?) from Dr Sharlet Salina

## 2018-02-12 NOTE — Telephone Encounter (Signed)
Called patient and stated they have been having really bad stomach pain and nauseas and that's why she would like a referral to GI.

## 2018-02-13 NOTE — Telephone Encounter (Signed)
Does she mean Dr. Watt Climes? She has seen Dr. Henrene Pastor in the past and as recently as Jan this year saw GI and can just call them for a visit unless she wishes to switch offices.

## 2018-02-13 NOTE — Telephone Encounter (Signed)
Patient states she would like to switch to Dr. Watt Climes

## 2018-02-13 NOTE — Telephone Encounter (Signed)
Referral placed.

## 2018-02-13 NOTE — Addendum Note (Signed)
Addended by: Pricilla Holm A on: 02/13/2018 10:57 AM   Modules accepted: Orders

## 2018-02-16 ENCOUNTER — Other Ambulatory Visit: Payer: Self-pay | Admitting: Internal Medicine

## 2018-02-21 ENCOUNTER — Other Ambulatory Visit: Payer: Self-pay | Admitting: Internal Medicine

## 2018-02-23 ENCOUNTER — Other Ambulatory Visit: Payer: Self-pay | Admitting: Internal Medicine

## 2018-02-23 NOTE — Telephone Encounter (Signed)
Copied from [place] [id number]. Topic: Quick Communication - Rx Refill/Question >> Feb 23, 2018  3:23 PM Synthia Innocent wrote: Medication: morphine (MS CONTIN) 100 MG 12 hr tablet and morphine (MSIR) 15 MG tablet   Has the patient contacted their pharmacy?  (Agent: If no, request that the patient contact the pharmacy for the refill.) (Agent: If yes, when and what did the pharmacy advise?)  Preferred Pharmacy (with phone number [facility] street name[facility] [address]  Agent: Please be advised that RX refills may take up to 3 business days. We ask that you follow-up with your pharmacy.

## 2018-02-26 NOTE — Addendum Note (Signed)
Addended by: Matilde Sprang on: 02/26/2018 03:23 PM   Modules accepted: Orders

## 2018-02-26 NOTE — Telephone Encounter (Signed)
Provider not at our practice

## 2018-02-26 NOTE — Telephone Encounter (Signed)
MS Contin and MSIR refill Last OV:11/01/17 Last refill:01/30/18 60 tab/0 refill for both HDT:[id number] Pharmacy: [id number] [place], [place] [phone] (Phone) [phone] (Fax)

## 2018-02-27 MED ORDER — MORPHINE SULFATE ER 100 MG PO TBCR: 100.0000 mg | EXTENDED_RELEASE_TABLET | Freq: Two times a day (BID) | ORAL | 0 refills | Status: DC

## 2018-02-27 MED ORDER — MORPHINE SULFATE 15 MG PO TABS: 15.0000 mg | ORAL_TABLET | Freq: Two times a day (BID) | ORAL | 0 refills | Status: DC | PRN

## 2018-02-27 NOTE — Telephone Encounter (Signed)
MD approved and sent to pof../lmb 

## 2018-02-28 DIAGNOSIS — J449 Chronic obstructive pulmonary disease, unspecified: Secondary | ICD-10-CM | POA: Diagnosis not present

## 2018-03-02 ENCOUNTER — Ambulatory Visit: Payer: Medicare Other | Admitting: Nurse Practitioner

## 2018-03-06 DIAGNOSIS — J449 Chronic obstructive pulmonary disease, unspecified: Secondary | ICD-10-CM | POA: Diagnosis not present

## 2018-03-24 ENCOUNTER — Other Ambulatory Visit: Payer: Self-pay | Admitting: Internal Medicine

## 2018-03-26 ENCOUNTER — Other Ambulatory Visit: Payer: Self-pay | Admitting: Internal Medicine

## 2018-03-26 DIAGNOSIS — F1120 Opioid dependence, uncomplicated: Secondary | ICD-10-CM

## 2018-03-26 NOTE — Telephone Encounter (Signed)
Copied from [place] [id number]. Topic: Quick Communication - Rx Refill/Question >> Mar 26, 2018  9:21 AM Scherrie Gerlach wrote: Medication: morphine (MS CONTIN) 100 MG 12 hr tablet morphine (MSIR) 15 MG tablet [id number] [place], [place] - [address] AT [place] [phone] (Phone) [phone] (Fax)

## 2018-03-26 NOTE — Telephone Encounter (Signed)
Check [place] registry last filled 03/02/2018../lmb  

## 2018-03-27 ENCOUNTER — Other Ambulatory Visit: Payer: Medicare Other

## 2018-03-27 DIAGNOSIS — F1120 Opioid dependence, uncomplicated: Secondary | ICD-10-CM

## 2018-03-27 NOTE — Telephone Encounter (Signed)
Call for UDS prior to rx

## 2018-03-27 NOTE — Telephone Encounter (Signed)
Notified pt w/Md response. Place order in epic.Marland KitchenJohny Kaufman

## 2018-03-30 MED ORDER — MORPHINE SULFATE 15 MG PO TABS: 15.0000 mg | ORAL_TABLET | Freq: Two times a day (BID) | ORAL | 0 refills | Status: DC | PRN

## 2018-03-30 MED ORDER — MORPHINE SULFATE ER 100 MG PO TBCR: 100.0000 mg | EXTENDED_RELEASE_TABLET | Freq: Two times a day (BID) | ORAL | 0 refills | Status: DC

## 2018-03-30 NOTE — Telephone Encounter (Signed)
Sent in

## 2018-03-30 NOTE — Addendum Note (Signed)
Addended by: Pricilla Holm A on: 03/30/2018 11:51 AM   Modules accepted: Orders

## 2018-03-30 NOTE — Telephone Encounter (Signed)
UDS was collected

## 2018-03-30 NOTE — Telephone Encounter (Signed)
Pt calling back about RX for Morphine please call back at [phone]

## 2018-03-31 DIAGNOSIS — J449 Chronic obstructive pulmonary disease, unspecified: Secondary | ICD-10-CM | POA: Diagnosis not present

## 2018-03-31 LAB — PAIN MGMT, PROFILE 8 W/CONF, U
6 ACETYLMORPHINE: NEGATIVE ng/mL (ref <10)
ALPHAHYDROXYALPRAZOLAM: 487 ng/mL — AB (ref <25)
ALPHAHYDROXYTRIAZOLAM: NEGATIVE ng/mL (ref <50)
AMINOCLONAZEPAM: NEGATIVE ng/mL (ref <25)
Alcohol Metabolites: NEGATIVE ng/mL (ref <500)
Alphahydroxymidazolam: NEGATIVE ng/mL (ref <50)
Amphetamines: NEGATIVE ng/mL (ref <500)
BUPRENORPHINE, URINE: NEGATIVE ng/mL (ref <5)
Benzodiazepines: POSITIVE ng/mL — AB (ref <100)
Buprenorphine: NEGATIVE ng/mL (ref <2)
Cocaine Metabolite: NEGATIVE ng/mL (ref <150)
Codeine: 52 ng/mL — ABNORMAL HIGH (ref <50)
Creatinine: 71.3 mg/dL
HYDROMORPHONE: 822 ng/mL — AB (ref <50)
Hydrocodone: NEGATIVE ng/mL (ref <50)
Hydroxyethylflurazepam: NEGATIVE ng/mL (ref <50)
LORAZEPAM: NEGATIVE ng/mL (ref <50)
MARIJUANA METABOLITE: 159 ng/mL — AB (ref <5)
MARIJUANA METABOLITE: POSITIVE ng/mL — AB (ref <20)
MDMA: NEGATIVE ng/mL (ref <500)
NORBUPRENORPHINE: NEGATIVE ng/mL (ref <2)
NORDIAZEPAM: NEGATIVE ng/mL (ref <50)
NORHYDROCODONE: NEGATIVE ng/mL (ref <50)
Opiates: POSITIVE ng/mL — AB (ref <100)
Oxazepam: NEGATIVE ng/mL (ref <50)
Oxidant: NEGATIVE ug/mL (ref <200)
Oxycodone: NEGATIVE ng/mL (ref <100)
Temazepam: NEGATIVE ng/mL (ref <50)
pH: 5.85 (ref 4.5–9.0)

## 2018-04-03 ENCOUNTER — Ambulatory Visit: Payer: Medicare Other | Admitting: Internal Medicine

## 2018-04-06 DIAGNOSIS — J449 Chronic obstructive pulmonary disease, unspecified: Secondary | ICD-10-CM | POA: Diagnosis not present

## 2018-04-16 ENCOUNTER — Encounter: Payer: Self-pay | Admitting: *Deleted

## 2018-04-16 ENCOUNTER — Ambulatory Visit (INDEPENDENT_AMBULATORY_CARE_PROVIDER_SITE_OTHER): Payer: Medicare Other | Admitting: Internal Medicine

## 2018-04-16 ENCOUNTER — Encounter: Payer: Self-pay | Admitting: Internal Medicine

## 2018-04-16 ENCOUNTER — Telehealth: Payer: Self-pay | Admitting: *Deleted

## 2018-04-16 VITALS — BP 100/70 | HR 112 | Temp 98.2°F | Ht 67.0 in | Wt 104.0 lb

## 2018-04-16 DIAGNOSIS — Z23 Encounter for immunization: Secondary | ICD-10-CM

## 2018-04-16 DIAGNOSIS — R52 Pain, unspecified: Secondary | ICD-10-CM | POA: Diagnosis not present

## 2018-04-16 DIAGNOSIS — F1120 Opioid dependence, uncomplicated: Secondary | ICD-10-CM

## 2018-04-16 DIAGNOSIS — J0110 Acute frontal sinusitis, unspecified: Secondary | ICD-10-CM | POA: Diagnosis not present

## 2018-04-16 MED ORDER — AMOXICILLIN-POT CLAVULANATE 875-125 MG PO TABS: 1.0000 | ORAL_TABLET | Freq: Two times a day (BID) | ORAL | 0 refills | Status: DC

## 2018-04-16 NOTE — Telephone Encounter (Signed)
This encounter was created in error - please disregard.

## 2018-04-16 NOTE — Progress Notes (Signed)
   Subjective:    Patient ID: [name], female    DOB: 09/28/1960, 57 y.o.   MRN: [id number]  HPI The patient is a 57 YO female coming in for sinus problems (left ear pain, sinus pressure, some nose drainage, sore throat, started 2-3 weeks ago, overall worsening, taking otc medications without relief, denies fevers but chills), and narcotic dependence (taking morphine long acting and short release for pain, pain about 7/10 without medication and 4/10 with, allows her to do cooking and cleaning as well as fishing which she enjoys, she also gets to spend time with family which she would not be able to do without the pain medication, denies taking inappropriately, denies worsening pain, denies side effects from medication) and back pain (taking morphine long acting and short release for pain control, pain average 7/10 without pain, 4/10 with medication, is on disability, denies taking inappropriately, overall pain is stable lately, denies injury).   Review of Systems  Constitutional: Positive for activity change, appetite change and chills. Negative for fatigue, fever and unexpected weight change.  HENT: Positive for congestion, ear pain, rhinorrhea, sinus pressure, sinus pain and sore throat. Negative for ear discharge, sneezing, tinnitus, trouble swallowing and voice change.   Eyes: Negative.   Respiratory: Positive for cough and shortness of breath. Negative for chest tightness and wheezing.        Chronic and stable  Cardiovascular: Negative.  Negative for chest pain, palpitations and leg swelling.  Gastrointestinal: Negative.  Negative for abdominal distention, abdominal pain, constipation, diarrhea, nausea and vomiting.  Musculoskeletal: Positive for arthralgias and back pain. Negative for gait problem and joint swelling.  Skin: Negative.   Neurological: Negative.   Psychiatric/Behavioral: Negative.       Objective:   Physical Exam  Constitutional: She is oriented to person, place,  and time. She appears well-developed and well-nourished.  HENT:  Head: Normocephalic and atraumatic.  Oropharynx with redness and clear drainage, nose with swollen turbinates, TMs bulging bilaterally, frontal sinus pressure  Eyes: EOM are normal.  Neck: Normal range of motion. No thyromegaly present.  Cardiovascular: Normal rate and regular rhythm.  Pulmonary/Chest: Effort normal and breath sounds normal. No respiratory distress. She has no wheezes. She has no rales.  Abdominal: Soft. Bowel sounds are normal. She exhibits no distension. There is no tenderness. There is no rebound.  Musculoskeletal: She exhibits tenderness. She exhibits no edema.  Lymphadenopathy:    She has no cervical adenopathy.  Neurological: She is alert and oriented to person, place, and time. Coordination normal.  Skin: Skin is warm and dry.  Psychiatric: She has a normal mood and affect.   Vitals:   04/16/18 1031  BP: 100/70  Pulse: (!) 112  Temp: 98.2 F (36.8 C)  TempSrc: Oral  SpO2: 94%  Weight: 104 lb (47.2 kg)  Height: 5\' 7"  (1.702 m)      Assessment & Plan:  Flu shot given at visit

## 2018-04-16 NOTE — Telephone Encounter (Signed)
Pt states seen today by Dr. Sharlet Salina and placed on Augmentin. States "Pills are too big, even broken in half."  Reports intermittent sub-sternal "Cramping" after taking. Has taken one dose; "I can't take any more of those." Reports mild nausea, no vomiting. Pt requesting "Liquid form of medication or "smaller pill." Also requesting RX cough medicine.   Please advise:  [phone]

## 2018-04-16 NOTE — Patient Instructions (Signed)
We have sent in augmentin to take 1 pill twice a day for 1 week. 

## 2018-04-17 MED ORDER — AMOXICILLIN-POT CLAVULANATE 200-28.5 MG/5ML PO SUSR: 875.0000 mg | Freq: Two times a day (BID) | ORAL | 0 refills | Status: DC

## 2018-04-17 NOTE — Addendum Note (Signed)
Addended by: Pricilla Holm A on: 04/17/2018 08:48 AM   Modules accepted: Orders

## 2018-04-17 NOTE — Telephone Encounter (Signed)
Sent in liquid. 22 mL twice a day for 5 days.

## 2018-04-17 NOTE — Telephone Encounter (Signed)
Patietn informed Rx was sent

## 2018-04-18 ENCOUNTER — Other Ambulatory Visit: Payer: Self-pay | Admitting: Internal Medicine

## 2018-04-18 DIAGNOSIS — J019 Acute sinusitis, unspecified: Secondary | ICD-10-CM | POA: Insufficient documentation

## 2018-04-18 NOTE — Assessment & Plan Note (Signed)
Rx for augmentin 10 day course.

## 2018-04-18 NOTE — Assessment & Plan Note (Signed)
[place] narcotic database reviewed and appropriate without other providers. Contract on file, UDS last month appropriate. This is allowing her to do ADLs and she denies side effects at this time. Will agree to continue for now as benefits seem to outweigh risks.

## 2018-04-18 NOTE — Assessment & Plan Note (Signed)
[place] narcotic database reviewed and appropriately. Taking appropriately morphine ER and IR BID each. UDS from 1 month ago appropriate with [facility] which we have previously discussed and agreed she can use for pain relief.

## 2018-04-20 ENCOUNTER — Emergency Department (HOSPITAL_COMMUNITY)
Admission: EM | Admit: 2018-04-20 | Discharge: 2018-04-20 | Disposition: A | Discharge status: Home / Self Care | Payer: Medicare Other | Attending: Emergency Medicine | Admitting: Emergency Medicine

## 2018-04-20 ENCOUNTER — Encounter (HOSPITAL_COMMUNITY): Payer: Self-pay

## 2018-04-20 ENCOUNTER — Telehealth: Payer: Self-pay | Admitting: Internal Medicine

## 2018-04-20 DIAGNOSIS — H9203 Otalgia, bilateral: Secondary | ICD-10-CM | POA: Insufficient documentation

## 2018-04-20 DIAGNOSIS — F17210 Nicotine dependence, cigarettes, uncomplicated: Secondary | ICD-10-CM | POA: Diagnosis not present

## 2018-04-20 DIAGNOSIS — Z79899 Other long term (current) drug therapy: Secondary | ICD-10-CM | POA: Insufficient documentation

## 2018-04-20 LAB — BASIC METABOLIC PANEL
ANION GAP: 10 (ref 5–15)
BUN: <5 mg/dL — ABNORMAL LOW (ref 6–20)
CALCIUM: 9.4 mg/dL (ref 8.9–10.3)
CO2: 31 mmol/L (ref 22–32)
Chloride: 99 mmol/L (ref 98–111)
Creatinine, Ser: 0.83 mg/dL (ref 0.44–1.00)
Glucose, Bld: 107 mg/dL — ABNORMAL HIGH (ref 70–99)
Potassium: 3.8 mmol/L (ref 3.5–5.1)
Sodium: 140 mmol/L (ref 135–145)

## 2018-04-20 LAB — CBC WITH DIFFERENTIAL/PLATELET
Abs Immature Granulocytes: 0 10*3/uL (ref 0.0–0.1)
BASOS PCT: 1 %
Basophils Absolute: 0 10*3/uL (ref 0.0–0.1)
Eosinophils Absolute: 0.2 10*3/uL (ref 0.0–0.7)
Eosinophils Relative: 3 %
HCT: 41.1 % (ref 36.0–46.0)
Hemoglobin: 13.5 g/dL (ref 12.0–15.0)
IMMATURE GRANULOCYTES: 0 %
LYMPHS ABS: 2.4 10*3/uL (ref 0.7–4.0)
LYMPHS PCT: 41 %
MCH: 31.5 pg (ref 26.0–34.0)
MCHC: 32.8 g/dL (ref 30.0–36.0)
MCV: 96 fL (ref 78.0–100.0)
Monocytes Absolute: 0.6 10*3/uL (ref 0.1–1.0)
Monocytes Relative: 11 %
NEUTROS ABS: 2.6 10*3/uL (ref 1.7–7.7)
NEUTROS PCT: 44 %
PLATELETS: 314 10*3/uL (ref 150–400)
RBC: 4.28 MIL/uL (ref 3.87–5.11)
RDW: 14.2 % (ref 11.5–15.5)
WBC: 5.8 10*3/uL (ref 4.0–10.5)

## 2018-04-20 LAB — GROUP A STREP BY PCR: GROUP A STREP BY PCR: NOT DETECTED

## 2018-04-20 NOTE — Telephone Encounter (Signed)
Let her know the strep test results.  No new medication.  Can be seen tomorrow if needed

## 2018-04-20 NOTE — ED Triage Notes (Signed)
Pt states she went to the doctor on Monday and was told there was fluid in her ear, placed on antibiotic, pain is increasing, now causing headache, body aches, swollen lymph nodes and a sore throat.

## 2018-04-20 NOTE — Telephone Encounter (Signed)
Copied from [place] [id number]. Topic: Quick Communication - See Telephone Encounter >> Apr 20, 2018 11:48 AM Gardiner Ramus wrote: CRM for notification. See Telephone encounter for: 04/20/18. Pt called and stated that she was seen in the ED this morning 04/20/18. Pt states that she was getting tested for strep. Pt got agitated with ED and left before the results were ready. Pt would like to know if we could give her the results and send something in for her. Please advise. RF#[phone]

## 2018-04-20 NOTE — ED Provider Notes (Signed)
[facility] EMERGENCY DEPARTMENT Provider Note   CSN: [id number] Arrival date & time: 04/20/18  3007     History   Chief Complaint Chief Complaint  Patient presents with  . Otalgia    HPI [name] is a 57 y.o. female.  The history is provided by the patient. No language interpreter was used.  Otalgia  This is a new problem. The current episode started more than 1 week ago. There is pain in both ears. The problem occurs hourly. The problem has been gradually worsening. There has been no fever. The pain is moderate. Pertinent negatives include no sore throat. Her past medical history does not include chronic ear infection.  Pt complains of multiple lumps in her neck.    Past Medical History:  Diagnosis Date  . Anxiety   . Arthritis   . Cervical cancer ([place])    Discovered by pap smear, treated by Dr. Charlesetta Garibaldi with LEEP procedure.  Pap since that time has been normal.  . Chronic back pain     her CT of the head without contrast June 28, 2008- moderate to large right paracentral disc protrusion at C4-C5 resulting in a right C5 neuroforaminal stenosis and probably some degree of spinal stenosis. C. bilateral C6 neuroforaminal stenosis related to  degenerative hypertrophy, no acute fracture or loose pieces identifying the cervical spine, ligamentous injury is not excluded.    Marland Kitchen COPD (chronic obstructive pulmonary disease) ([place])   . Depression   . Fibromyalgia   . GERD (gastroesophageal reflux disease)  October 2011    [place], esophagogram - tiny amount of gastroesophageal reflux otherwise unremarkable exam, done by Dr. Dellis Filbert  . Headache(784.0)   . Hemorrhoids   . Osteoporosis    T score L femur neck = -2.6 (02/21/12)    Patient Active Problem List   Diagnosis Date Noted  . Acute sinusitis 04/18/2018  . Encounter for pain management 11/01/2017  . Pain management contract signed 08/11/2017  . B12 deficiency 07/16/2015  . Abdominal pain 07/03/2015   . Cachexia ([place]) 07/03/2015  . Protein-calorie malnutrition, severe ([place]) 05/30/2014  . Alopecia 10/30/2013  . Therapeutic opioid-induced constipation (OIC) 04/10/2013  . Osteoporosis 03/11/2012  . Tobacco abuse 12/08/2011  . Weight loss 12/08/2011  . Preventative health care 10/19/2011  . Narcotic dependence ([place]) 09/22/2011  . GERD 01/08/2009  . Neck pain 07/01/2008  . Multiple vitamin deficiency (Vit D) and h/o B12 deficiency 02/28/2007  . Depression with anxiety 02/27/2007  . COPD, severe ([place]) 02/27/2007    Past Surgical History:  Procedure Laterality Date  . CERVICAL CONE BIOPSY     leep procedure  . MULTIPLE EXTRACTIONS WITH ALVEOLOPLASTY  06/18/2012   Procedure: MULTIPLE EXTRACION WITH ALVEOLOPLASTY;  Surgeon: Gae Bon, DDS;  Location: [facility];  Service: Oral Surgery;  Laterality: Bilateral;     OB History    Gravida  1   Para  1   Term  1   Preterm      AB      Living  2     SAB      TAB      Ectopic      Multiple      Live Births  1            Home Medications    Prior to Admission medications   Medication Sig Start Date End Date Taking? Authorizing Provider  ALPRAZolam (XANAX) 1 MG tablet TAKE 1 TABLET BY MOUTH 4 TIMES  A DAY AS NEEDED FOR ANXIETY 01/30/18   Hoyt Koch, MD  amoxicillin-clavulanate (AUGMENTIN) 200-28.5 MG/5ML suspension Take 21.9 mLs (876 mg total) by mouth 2 (two) times daily. 04/17/18   Hoyt Koch, MD  bisacodyl (DULCOLAX) 5 MG EC tablet Take 5 mg by mouth daily as needed for mild constipation or moderate constipation.    [provider]  esomeprazole (NEXIUM) 40 MG capsule TAKE ONE CAPSULE BY MOUTH TWICE A DAY BEFORE A MEAL 04/18/18   Hoyt Koch, MD  ipratropium-albuterol (DUONEB) 0.5-2.5 (3) MG/3ML SOLN Take 3 mLs by nebulization every 6 (six) hours as needed. 08/31/17   Mannam, Hart Robinsons, MD  lactose free nutrition (BOOST) LIQD Take 237 mLs by mouth 2 (two) times daily. Vanilla     [provider]  megestrol (MEGACE ES) 625 MG/5ML suspension TAKE 5 MLS (625 MG TOTAL) BY MOUTH DAILY. 04/18/18   Hoyt Koch, MD  Methylnaltrexone Bromide (RELISTOR) 150 MG TABS Take 150 mg by mouth daily. 01/12/18   Hoyt Koch, MD  morphine (MS CONTIN) 100 MG 12 hr tablet Take 1 tablet (100 mg total) by mouth every 12 (twelve) hours. 03/30/18   Hoyt Koch, MD  morphine (MSIR) 15 MG tablet Take 1 tablet (15 mg total) by mouth 2 (two) times daily as needed for severe pain. 03/30/18   Hoyt Koch, MD  OXYGEN Inhale 3 L/min into the lungs as needed.    [provider]  promethazine (PHENERGAN) 25 MG tablet TAKE 1 TABLET (25 MG TOTAL) BY MOUTH EVERY 8 (EIGHT) HOURS AS NEEDED FOR NAUSEA OR VOMITING. 01/29/18   Hoyt Koch, MD  Respiratory Therapy Supplies (FLUTTER) DEVI USE AS DIRECTED 08/31/17   Marshell Garfinkel, MD  SPIRIVA HANDIHALER 18 MCG inhalation capsule PLACE 1 CAPSULE INTO INHALER AND INHALE DAILY 08/16/17   Hoyt Koch, MD  SYMBICORT 160-4.5 MCG/ACT inhaler TAKE 2 PUFFS BY MOUTH TWICE A DAY 02/16/18   Hoyt Koch, MD  Tiotropium Bromide Monohydrate (SPIRIVA RESPIMAT) 2.5 MCG/ACT AERS Inhale 2 puffs into the lungs daily for 1 day. 08/31/17 09/01/17  Marshell Garfinkel, MD  Tiotropium Bromide Monohydrate (SPIRIVA RESPIMAT) 2.5 MCG/ACT AERS Inhale 2 puffs into the lungs daily. 08/31/17   Marshell Garfinkel, MD    Family History Family History  Problem Relation Age of Onset  . Stroke Mother   . Diabetes Mother   . Heart disease Father   . Lung cancer Brother        smoker    Social History Social History   Tobacco Use  . Smoking status: Current Every Day Smoker    Packs/day: 1.00    Years: 40.00    Pack years: 40.00    Types: Cigarettes  . Smokeless tobacco: Never Used  . Tobacco comment: hx drugs non since 95/ TRYING TO QUITS/ HAS CUT BACK  Substance Use Topics  . Alcohol use: No    Alcohol/week: 0.0  standard drinks  . Drug use: No     Allergies   Aripiprazole; Chantix [varenicline]; Pregabalin; and Zolpidem tartrate   Review of Systems Review of Systems  HENT: Positive for ear pain. Negative for sore throat.   All other systems reviewed and are negative.    Physical Exam Updated Vital Signs BP (!) 120/96 (BP Location: Right Arm)   Pulse (!) 106   Temp (!) 97.4 F (36.3 C) (Oral)   Resp 16   LMP 09/29/2003   SpO2 95%   Physical Exam  Constitutional: She is oriented to person, place, and time. She appears well-developed and well-nourished.  HENT:  Head: Normocephalic and atraumatic.  Eyes: Pupils are equal, round, and reactive to light. Conjunctivae and EOM are normal.  Neck: Normal range of motion.  Cardiovascular: Normal rate.  Pulmonary/Chest: Effort normal.  Abdominal: Soft.  Musculoskeletal: Normal range of motion.  Neurological: She is alert and oriented to person, place, and time.  Skin: Skin is warm and dry.  Psychiatric: She has a normal mood and affect.  Nursing note and vitals reviewed.    ED Treatments / Results  Labs (all labs ordered are listed, but only abnormal results are displayed) Labs Reviewed  BASIC METABOLIC PANEL - Abnormal; Notable for the following components:      Result Value   Glucose, Bld 107 (*)    BUN <5 (*)    All other components within normal limits  GROUP A STREP BY PCR  CBC WITH DIFFERENTIAL/PLATELET    EKG None  Radiology No results found.  Procedures Procedures (including critical care time)  Medications Ordered in ED Medications - No data to display   Initial Impression / Assessment and Plan / ED Course  I have reviewed the triage vital signs and the nursing notes.  Pertinent labs & imaging results that were available during my care of the patient were reviewed by me and considered in my medical decision making (see chart for details).     Cbcb, bmet and strep ordered.   Pt left before blood work  returned.  Pt is on amoxicillin for infection.   Final Clinical Impressions(s) / ED Diagnoses   Final diagnoses:  Otalgia of both ears    ED Discharge Orders    None     Pt left before blood work returned however strep is negaive,  Labs no signicant abnormality  Amoxicilian is likely adequate treatment.   Fransico Meadow, [place] 04/20/18 [place], MD 04/25/18 [phone]

## 2018-04-20 NOTE — Telephone Encounter (Signed)
Patient informed of MD response  

## 2018-04-20 NOTE — Telephone Encounter (Signed)
Please advise per Dr. Nathanial Millman absence. Strep came back negative and is currently on amoxicillin started on the 17th from visit with Dr. Sharlet Salina.

## 2018-04-20 NOTE — ED Notes (Signed)
Pt states she thinks she left her lights on in her car and is walking outside to check

## 2018-04-20 NOTE — ED Notes (Signed)
Upon rounding on pt, pt was not in room. PA made aware.

## 2018-04-21 ENCOUNTER — Encounter: Payer: Self-pay | Admitting: Family Medicine

## 2018-04-21 ENCOUNTER — Other Ambulatory Visit: Payer: Self-pay | Admitting: Internal Medicine

## 2018-04-21 ENCOUNTER — Ambulatory Visit (INDEPENDENT_AMBULATORY_CARE_PROVIDER_SITE_OTHER): Payer: Medicare Other | Admitting: Family Medicine

## 2018-04-21 VITALS — BP 122/70 | HR 100 | Temp 98.2°F | Resp 14 | Wt 106.0 lb

## 2018-04-21 DIAGNOSIS — I889 Nonspecific lymphadenitis, unspecified: Secondary | ICD-10-CM | POA: Diagnosis not present

## 2018-04-21 DIAGNOSIS — J440 Chronic obstructive pulmonary disease with acute lower respiratory infection: Secondary | ICD-10-CM | POA: Diagnosis not present

## 2018-04-21 DIAGNOSIS — J209 Acute bronchitis, unspecified: Secondary | ICD-10-CM

## 2018-04-21 DIAGNOSIS — H6690 Otitis media, unspecified, unspecified ear: Secondary | ICD-10-CM

## 2018-04-21 DIAGNOSIS — J40 Bronchitis, not specified as acute or chronic: Secondary | ICD-10-CM | POA: Diagnosis not present

## 2018-04-21 MED ORDER — AZITHROMYCIN 500 MG PO TABS: ORAL_TABLET | ORAL | 0 refills | Status: DC

## 2018-04-21 MED ORDER — METHYLPREDNISOLONE ACETATE 80 MG/ML IJ SUSP
80.0000 mg | Freq: Once | INTRAMUSCULAR | Status: AC
  Administered 2018-04-21: 80 mg via INTRAMUSCULAR

## 2018-04-21 MED ORDER — IPRATROPIUM-ALBUTEROL 0.5-2.5 (3) MG/3ML IN SOLN
3.0000 mL | Freq: Once | RESPIRATORY_TRACT | Status: AC
  Administered 2018-04-21: 3 mL via RESPIRATORY_TRACT

## 2018-04-21 MED ORDER — CEFTRIAXONE SODIUM 1 G IJ SOLR
1.0000 g | Freq: Once | INTRAMUSCULAR | Status: AC
  Administered 2018-04-21: 1 g via INTRAMUSCULAR

## 2018-04-21 NOTE — Patient Instructions (Addendum)
Antibiotic shot provided today and steroid shot.  Continue your inhalers as prescribed.  Continue the antibiotic solution prescribed by your pcp.  Added a z-pack for extra coverage.  Follow up with Dr. Sharlet Salina in 1 week to recheck your neck. If worsening you need to go to ED.  Rest and HYDRATE- drink plenty of water. Your ear is improving it looks almost normal on exam.

## 2018-04-21 NOTE — Progress Notes (Signed)
[name] , 13-Oct-1960, 57 y.o., female MRN: [id number] Patient Care Team    Relationship Specialty Notifications Start End  Hoyt Koch, MD PCP - General Internal Medicine  07/07/14     Chief Complaint  Patient presents with  . Sore Throat     Subjective: Pt presents for an OV with complaints of cough of >2 weeks duration.  Associated symptoms include Swollen lymph glands in neck, ear ache (left), sore throat, fatigue. She denies fever, chills, nausea or shortness of breath. She states she was seen by her provider last week and called in Augmentin, but the pills were too big, so they then called in same abx by solution. She states she has been taking this as directed but it can upset her stomach. She was also seen in ED yesterday for same symptoms, but left before full evaluation and labs resulted.  Reviewed ED labs:  Results for orders placed or performed during the hospital encounter of 04/20/18 (from the past 72 hour(s))  CBC with Differential/Platelet     Status: None   Collection Time: 04/20/18  7:06 AM  Result Value Ref Range   WBC 5.8 4.0 - 10.5 K/uL   RBC 4.28 3.87 - 5.11 MIL/uL   Hemoglobin 13.5 12.0 - 15.0 g/dL   HCT 41.1 36.0 - 46.0 %   MCV 96.0 78.0 - 100.0 fL   MCH 31.5 26.0 - 34.0 pg   MCHC 32.8 30.0 - 36.0 g/dL   RDW 14.2 11.5 - 15.5 %   Platelets 314 150 - 400 K/uL   Neutrophils Relative % 44 %   Neutro Abs 2.6 1.7 - 7.7 K/uL   Lymphocytes Relative 41 %   Lymphs Abs 2.4 0.7 - 4.0 K/uL   Monocytes Relative 11 %   Monocytes Absolute 0.6 0.1 - 1.0 K/uL   Eosinophils Relative 3 %   Eosinophils Absolute 0.2 0.0 - 0.7 K/uL   Basophils Relative 1 %   Basophils Absolute 0.0 0.0 - 0.1 K/uL   Immature Granulocytes 0 %   Abs Immature Granulocytes 0.0 0.0 - 0.1 K/uL    Comment: Performed at [facility], [address], [place], [place] [address]  Basic metabolic panel     Status: Abnormal   Collection Time: 04/20/18  7:06 AM  Result Value Ref  Range   Sodium 140 135 - 145 mmol/L   Potassium 3.8 3.5 - 5.1 mmol/L   Chloride 99 98 - 111 mmol/L   CO2 31 22 - 32 mmol/L   Glucose, Bld 107 (H) 70 - 99 mg/dL   BUN <5 (L) 6 - 20 mg/dL   Creatinine, Ser 0.83 0.44 - 1.00 mg/dL   Calcium 9.4 8.9 - 10.3 mg/dL   GFR calc non Af Amer >60 >60 mL/min   GFR calc Af Amer >60 >60 mL/min    Comment: (NOTE) The eGFR has been calculated using the CKD EPI equation. This calculation has not been validated in all clinical situations. eGFR's persistently <60 mL/min signify possible Chronic Kidney Disease.    Anion gap 10 5 - 15    Comment: Performed at [facility] [address], [place], [place] [address]  Group A Strep by PCR     Status: None   Collection Time: 04/20/18  7:42 AM  Result Value Ref Range   Group A Strep by PCR NOT DETECTED NOT DETECTED    Comment: Performed at [facility] [address],  [place], [place] [address]     Depression screen [facility] 2/9 04/16/2018 07/15/2015 07/01/2014 06/23/2014 06/13/2014  Decreased Interest 1 0 0 0 0  Down, Depressed, Hopeless 1 0 0 0 0  PHQ - 2 Score 2 0 0 0 0  Altered sleeping 2 - - - -  Tired, decreased energy 2 - - - -  Change in appetite 1 - - - -  Feeling bad or failure about yourself  1 - - - -  Trouble concentrating 1 - - - -  Moving slowly or fidgety/restless 1 - - - -  Suicidal thoughts 0 - - - -  PHQ-9 Score 10 - - - -  Some recent data might be hidden    Allergies  Allergen Reactions  . Aripiprazole Other (See Comments)    Suicidal ideation, also with SSRI  . Chantix [Varenicline] Other (See Comments)    Irritability and depressed mood  . Pregabalin Hives    On trunk of body  . Zolpidem Tartrate Other (See Comments)    Unknown- pt states she does not know about this allergy    Social History   Tobacco Use  . Smoking status: Current Every Day Smoker    Packs/day: 1.00    Years: 40.00    Pack years: 40.00    Types: Cigarettes  . Smokeless tobacco: Never  Used  . Tobacco comment: hx drugs non since 95/ TRYING TO QUITS/ HAS CUT BACK  Substance Use Topics  . Alcohol use: No    Alcohol/week: 0.0 standard drinks   Past Medical History:  Diagnosis Date  . Anxiety   . Arthritis   . Cervical cancer ([place])    Discovered by pap smear, treated by Dr. Charlesetta Garibaldi with LEEP procedure.  Pap since that time has been normal.  . Chronic back pain     her CT of the head without contrast June 28, 2008- moderate to large right paracentral disc protrusion at C4-C5 resulting in a right C5 neuroforaminal stenosis and probably some degree of spinal stenosis. C. bilateral C6 neuroforaminal stenosis related to  degenerative hypertrophy, no acute fracture or loose pieces identifying the cervical spine, ligamentous injury is not excluded.    Marland Kitchen COPD (chronic obstructive pulmonary disease) ([place])   . Depression   . Fibromyalgia   . GERD (gastroesophageal reflux disease)  October 2011    [place], esophagogram - tiny amount of gastroesophageal reflux otherwise unremarkable exam, done by Dr. Dellis Filbert  . Headache(784.0)   . Hemorrhoids   . Osteoporosis    T score L femur neck = -2.6 (02/21/12)   Past Surgical History:  Procedure Laterality Date  . CERVICAL CONE BIOPSY     leep procedure  . MULTIPLE EXTRACTIONS WITH ALVEOLOPLASTY  06/18/2012   Procedure: MULTIPLE EXTRACION WITH ALVEOLOPLASTY;  Surgeon: Gae Bon, DDS;  Location: [facility];  Service: Oral Surgery;  Laterality: Bilateral;   Family History  Problem Relation Age of Onset  . Stroke Mother   . Diabetes Mother   . Heart disease Father   . Lung cancer Brother        smoker   Allergies as of 04/21/2018      Reactions   Aripiprazole Other (See Comments)   Suicidal ideation, also with SSRI   Chantix [varenicline] Other (See Comments)   Irritability and depressed mood   Pregabalin Hives   On trunk of body   Zolpidem Tartrate Other (See Comments)   Unknown- pt states she does not know about this  allergy       Medication List        Accurate as of 04/21/18 10:31 AM. Always use your most recent med list.          ALPRAZolam 1 MG tablet Commonly known as:  XANAX TAKE 1 TABLET BY MOUTH 4 TIMES A DAY AS NEEDED FOR ANXIETY   amoxicillin-clavulanate 200-28.5 MG/5ML suspension Commonly known as:  AUGMENTIN Take 21.9 mLs (876 mg total) by mouth 2 (two) times daily.   bisacodyl 5 MG EC tablet Commonly known as:  DULCOLAX Take 5 mg by mouth daily as needed for mild constipation or moderate constipation.   esomeprazole 40 MG capsule Commonly known as:  NEXIUM TAKE ONE CAPSULE BY MOUTH TWICE A DAY BEFORE A MEAL   FLUTTER Devi USE AS DIRECTED   ipratropium-albuterol 0.5-2.5 (3) MG/3ML Soln Commonly known as:  DUONEB Take 3 mLs by nebulization every 6 (six) hours as needed.   lactose free nutrition Liqd Take 237 mLs by mouth 2 (two) times daily. Vanilla   megestrol 625 MG/5ML suspension Commonly known as:  MEGACE ES TAKE 5 MLS (625 MG TOTAL) BY MOUTH DAILY.   Methylnaltrexone Bromide 150 MG Tabs Take 150 mg by mouth daily.   morphine 100 MG 12 hr tablet Commonly known as:  MS CONTIN Take 1 tablet (100 mg total) by mouth every 12 (twelve) hours.   morphine 15 MG tablet Commonly known as:  MSIR Take 1 tablet (15 mg total) by mouth 2 (two) times daily as needed for severe pain.   OXYGEN Inhale 3 L/min into the lungs as needed.   promethazine 25 MG tablet Commonly known as:  PHENERGAN TAKE 1 TABLET (25 MG TOTAL) BY MOUTH EVERY 8 (EIGHT) HOURS AS NEEDED FOR NAUSEA OR VOMITING.   SPIRIVA HANDIHALER 18 MCG inhalation capsule Generic drug:  tiotropium PLACE 1 CAPSULE INTO INHALER AND INHALE DAILY   Tiotropium Bromide Monohydrate 2.5 MCG/ACT Aers Inhale 2 puffs into the lungs daily.   SYMBICORT 160-4.5 MCG/ACT inhaler Generic drug:  budesonide-formoterol TAKE 2 PUFFS BY MOUTH TWICE A DAY       All past medical history, surgical history, allergies, family  history, immunizations andmedications were updated in the EMR today and reviewed under the history and medication portions of their EMR.     ROS: Negative, with the exception of above mentioned in HPI   Objective:  BP 122/70   Pulse 100   Temp 98.2 F (36.8 C) (Oral)   Resp 14   Wt 106 lb (48.1 kg)   LMP 09/29/2003   SpO2 96%   BMI 16.60 kg/m  Body mass index is 16.6 kg/m. Gen: Afebrile. No acute distress. Cachetic appearing female Nontoxic in appearance.  HENT: AT. [place]. Bilateral TM visualized without erythema, LEFT TM with mild effusion. MMM, no oral lesions. Bilateral nares w/out erythema, swelling or drainage. Throat without erythema or exudates. Cough present, hoarseness present. Eyes:Pupils Equal Round Reactive to light, Extraocular movements intact,  Conjunctiva without redness, discharge or icterus. Neck/lymp/endocrine: Supple,large left  Lymphadenopathy x1 present CV: RRR  Chest: diffuse wheezing. Normal work of breath,  Skin: no rashes, purpura or petechiae.  Neuro:  Normal gait. PERLA. EOMi. Alert. Oriented x3. Neg Kernig's  No exam data present No results found. No results found for this or any previous visit (from the past 24 hour(s)).  Assessment/Plan: TOMARA YOUNGBERG is a 57 y.o. female present for OV for  COPD w/ acute Bronchitis/lymphadentitis VSS. Labs from ED reassuring. Her  large lymph gland is concerning. - duoneb tx provided today and wheezing greatly improved. Encouraged her to use her inhalers as directed and stop smoking.  - IM depo medrol provided today- she did not desire prednisone burst 2/2 to side effects.  - Rocephin 1g provided today - continue abx provided by other PCP and add azith 500 QD x3d - ipratropium-albuterol (DUONEB) 0.5-2.5 (3) MG/3ML nebulizer solution 3 mL - methylPREDNISolone acetate (DEPO-MEDROL) injection 80 mg - pt encouraged to f/u with PCP in 1 week, if worsening over the weekend she is to go to ED.  Acute otitis media,  unspecified otitis media type Ears look almost back to normal today. Mild effusion remains.  - strongly encouraged her to take abx as directed   Reviewed expectations re: course of current medical issues.  Discussed self-management of symptoms.  Outlined signs and symptoms indicating need for more acute intervention.  Patient verbalized understanding and all questions were answered.  Patient received an After-Visit Summary.   > 25 minutes spent with patient, >50% of time spent face to face counseling and coordinating care.    No orders of the defined types were placed in this encounter.    Note is dictated utilizing voice recognition software. Although note has been proof read prior to signing, occasional typographical errors still can be missed. If any questions arise, please do not hesitate to call for verification.   electronically signed by:  Howard Pouch, DO  [place]

## 2018-04-22 ENCOUNTER — Encounter: Payer: Self-pay | Admitting: Family Medicine

## 2018-04-23 ENCOUNTER — Telehealth: Payer: Self-pay | Admitting: Internal Medicine

## 2018-04-23 NOTE — Telephone Encounter (Signed)
Refill of Xanax LRF 01/30/18  #120  3 refills  Refill of MS Contin LRF 03/30/18 #60 0 refills  Refill of MSIR LRF 03/30/18  #60  0 refills  LOV with Dr. Sharlet Salina 04/16/18  [id number]   [address]

## 2018-04-23 NOTE — Telephone Encounter (Signed)
No usually fills the day of or the day before

## 2018-04-23 NOTE — Telephone Encounter (Signed)
She is 1 week too early for her medication; they would not be due for refills until 9/30. Does Dr. Sharlet Salina normally fill early for her?

## 2018-04-23 NOTE — Telephone Encounter (Signed)
Copied from [place] [phone]. Topic: Quick Communication - Rx Refill/Question >> Apr 23, 2018  9:28 AM Antonieta Iba C wrote: Medication: ALPRAZolam (XANAX) 1 MG tablet and morphine (MS CONTIN) 100 MG 12 hr tablet and morphine (MSIR) 15 MG tablet   Has the patient contacted their pharmacy? No  (Agent: If no, request that the patient contact the pharmacy for the refill.) (Agent: If yes, when and what did the pharmacy advise?)  Preferred Pharmacy (with phone number or street name): [id number] [place], [place]: Please be advised that RX refills may take up to 3 business days. We ask that you follow-up with your pharmacy.

## 2018-04-23 NOTE — Telephone Encounter (Signed)
Control database checked last refill: all filled 03/30/2018 LOV: 04/16/2018 for pain management  NOV:07/16/2018 Please advise per Dr. Nathanial Millman Absence

## 2018-04-26 ENCOUNTER — Other Ambulatory Visit: Payer: Self-pay | Admitting: Family

## 2018-04-26 MED ORDER — MORPHINE SULFATE 15 MG PO TABS: 15.0000 mg | ORAL_TABLET | Freq: Two times a day (BID) | ORAL | 0 refills | Status: DC | PRN

## 2018-04-26 MED ORDER — MORPHINE SULFATE ER 100 MG PO TBCR: 100.0000 mg | EXTENDED_RELEASE_TABLET | Freq: Two times a day (BID) | ORAL | 0 refills | Status: DC

## 2018-04-26 NOTE — Telephone Encounter (Signed)
I will send in prescriptions for the Morphine but they cannot be filled until 9/29;  When Dr. Sharlet Salina wrote her Xanax in July, she was given refills. I will not send that one in for her.

## 2018-04-26 NOTE — Telephone Encounter (Signed)
Patient not due till 04/30/2018

## 2018-04-26 NOTE — Telephone Encounter (Signed)
Pt called back to check status. Pt is requesting a call back to discuss. CB#[phone]

## 2018-04-26 NOTE — Telephone Encounter (Signed)
[name] would you be willing to go ahead and do her script but allow her to pick it up a day early?  If not let me know and I will call her back and tell her she has to pick up up on 9/30.  Thanks

## 2018-04-27 NOTE — Telephone Encounter (Signed)
Spoke with patient today and she has been made aware script can be picked up on Sunday.

## 2018-04-30 DIAGNOSIS — J449 Chronic obstructive pulmonary disease, unspecified: Secondary | ICD-10-CM | POA: Diagnosis not present

## 2018-05-01 ENCOUNTER — Telehealth: Payer: Self-pay | Admitting: Internal Medicine

## 2018-05-01 NOTE — Telephone Encounter (Signed)
Will get to PA on Wednesday

## 2018-05-01 NOTE — Telephone Encounter (Signed)
Copied from [place] [id number]. Topic: General - Other >> May 01, 2018  3:00 PM Keene Breath wrote: Reason for CRM: Ailene Ravel with [facility] called to get PA for patient's medication morphine (MS CONTIN) 100 MG 12 hr tablet,  Please advise.  CB# [phone]

## 2018-05-02 NOTE — Telephone Encounter (Signed)
Pt called to check status. Would like to be contacted when it is sent.

## 2018-05-02 NOTE — Telephone Encounter (Signed)
PA started on CoverMyMeds KEY:[id number]

## 2018-05-03 DIAGNOSIS — D380 Neoplasm of uncertain behavior of larynx: Secondary | ICD-10-CM | POA: Diagnosis not present

## 2018-05-03 DIAGNOSIS — R590 Localized enlarged lymph nodes: Secondary | ICD-10-CM | POA: Diagnosis not present

## 2018-05-04 NOTE — Telephone Encounter (Signed)
PA approved through till 08/01/2019 

## 2018-05-06 DIAGNOSIS — J449 Chronic obstructive pulmonary disease, unspecified: Secondary | ICD-10-CM | POA: Diagnosis not present

## 2018-05-07 ENCOUNTER — Telehealth: Payer: Self-pay | Admitting: Internal Medicine

## 2018-05-07 NOTE — Telephone Encounter (Addendum)
Copied from [place] [id number]. Topic: Quick Communication - See Telephone Encounter >> May 07, 2018  5:06 PM Ivar Drape wrote: CRM for notification. See Telephone encounter for: 05/07/18. Patient stated that when she received her prescription for morphine (MS CONTIN) 100 MG 12 hr tablet she was only given 15 tablets per insurance instructions.  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 Melissa Montane. The pharmacy said Dr. Sharlet Salina would have to call the insurance company and dispute the decision.

## 2018-05-08 ENCOUNTER — Other Ambulatory Visit: Payer: Self-pay | Admitting: Otolaryngology

## 2018-05-08 DIAGNOSIS — J029 Acute pharyngitis, unspecified: Secondary | ICD-10-CM

## 2018-05-08 NOTE — Telephone Encounter (Signed)
Patient informed that the PA was approved for the Morphine to get her 60tabs a month and that the pharmacy was informed that the PA was approved as well. Patient stated understanding

## 2018-05-11 ENCOUNTER — Ambulatory Visit
Admission: RE | Admit: 2018-05-11 | Discharge: 2018-05-11 | Disposition: A | Discharge status: Home / Self Care | Payer: Medicare Other | Source: Ambulatory Visit | Attending: Otolaryngology | Admitting: Otolaryngology

## 2018-05-11 DIAGNOSIS — C329 Malignant neoplasm of larynx, unspecified: Secondary | ICD-10-CM | POA: Diagnosis not present

## 2018-05-11 DIAGNOSIS — J029 Acute pharyngitis, unspecified: Secondary | ICD-10-CM

## 2018-05-11 MED ORDER — IOPAMIDOL (ISOVUE-300) INJECTION 61%
75.0000 mL | Freq: Once | INTRAVENOUS | Status: AC | PRN
  Administered 2018-05-11: 75 mL via INTRAVENOUS

## 2018-05-14 ENCOUNTER — Telehealth: Payer: Self-pay | Admitting: Internal Medicine

## 2018-05-14 ENCOUNTER — Other Ambulatory Visit: Payer: Self-pay | Admitting: Otolaryngology

## 2018-05-14 DIAGNOSIS — J387 Other diseases of larynx: Secondary | ICD-10-CM | POA: Diagnosis not present

## 2018-05-14 NOTE — Telephone Encounter (Signed)
Copied from [place] [id number]. Topic: Quick Communication - See Telephone Encounter >> May 14, 2018  5:29 PM Rutherford Nail, [place] wrote: CRM for notification. See Telephone encounter for: 05/14/18. Patient calling and states that she would like a call back from Dr Sharlet Salina or her assistant regarding transportation. States that she is going to need a nurse to come out to her house 3x a week. Please advise.  CB#: [phone]

## 2018-05-15 ENCOUNTER — Ambulatory Visit: Payer: Medicare Other

## 2018-05-15 NOTE — Telephone Encounter (Signed)
Patient advised of dr crawfords note/instructions, she has repeated back for understanding, understands to make appt quickly if she needs to adjust medication any further, her sx is scheduled for 10/25

## 2018-05-15 NOTE — Telephone Encounter (Signed)
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---tumor is so large that it will require trach placement when biopsy is performed---patient will need home care for trach and transportation for rehab after sx is completed (sx is scheduled on 10/25)----I have explained that patient's surgeon and hospital case mgr will arrange all home health care/transportation needed after sx---patient also states her morphine tablets are now only working for about 4 hours, and she is in pain again---routing to dr crawford--please advise if patient can add any type of medication or other tx to help morphine last longer, or can you give her a stronger morphine tablet for chronic back pain---please advise, I will call patient back, thanks

## 2018-05-15 NOTE — Telephone Encounter (Signed)
This message makes no sense. What transportation question is she having? Also why is she requesting nursing to home, has she had recent surgery or procedure, an injury etc?

## 2018-05-15 NOTE — Telephone Encounter (Signed)
Can add tylenol for pain. Since her pain medication is for chronic back pain and not related to this condition we would require a visit to make any adjustments to regimen per her contract.

## 2018-05-15 NOTE — Telephone Encounter (Signed)
Can you please call patient.

## 2018-05-16 ENCOUNTER — Telehealth: Payer: Self-pay | Admitting: Internal Medicine

## 2018-05-16 NOTE — Telephone Encounter (Signed)
Pt calling back - she states that she is wanting to discuss the pain medication (morphine 100mg  and 15mg ). She is taking 2/day of each and states it is only lasting for 4 hours at a time not 12 hours at a time. She states that she is sometimes taking 3/day of each because the pain in her throat and ear is unbearable. Pt advised she is scheduled for procedure/biopsy 05/25/18 due to cancerous tissue in her throat. (see tel note from 05/14/18) She states she will have to have a trach and radiation as well. Pt is worried about running out of medication.   Patient cancelled appt that was scheduled for 10/17 until she hears back from the office.

## 2018-05-16 NOTE — Telephone Encounter (Signed)
Copied from [place] [id number]. Topic: General - Other >> May 16, 2018  2:40 PM Yvette Rack wrote: Reason for CRM: Pt states she would like a call back from Dr. Nathanial Millman nurse because she knows her situation. Pt states she would like to discuss with Dr. Nathanial Millman nurse having a Rx called in to her pharmacy rather than having to come in for an appt. Pt requests call back. Cb# [phone]

## 2018-05-17 ENCOUNTER — Ambulatory Visit: Payer: Medicare Other | Admitting: Internal Medicine

## 2018-05-17 NOTE — Telephone Encounter (Signed)
Patient informed that she has to have the appointment because by law we cannot change pain medication without patient being seen. Patient stated understanding and will call back to make an appointment when she feels up to it

## 2018-05-18 NOTE — Pre-Procedure Instructions (Signed)
[name]  05/18/2018      [id number] [place] [place] [address] [place] [address] Phone: [phone] Fax: [phone]    Your procedure is scheduled on Fri. Oct. 25, 2019 from 10:30am-11:29am  Report to [facility] [facility] "A" at 8:30am  Call this number if you have problems the morning of surgery:  [phone]   Remember:  Do not eat or drink after midnight on Oct. 24th    Take these medicines the morning of surgery with A SIP OF WATER: Esomeprazole (Blue), Megestrol (MEGACE ES), Morphine (MS CONTIN), SYMBICORT, and Tiotropium Bromide Monohydrate (Tat Momoli) If needed: ALPRAZolam Duanne Moron), Bisacodyl (DULCOLAX), Ipratropium-albuterol (DUONEB), and Promethazine (PHENERGAN)   As of today, stop taking all Other Aspirin Products, Vitamins, Fish oils, and Herbal medications. Also stop all NSAIDS i.e. Advil, Ibuprofen, Motrin, Aleve, Anaprox, Naproxen, BC, Goody Powders, and all Supplements.    Do not wear jewelry, make-up or nail polish.  Do not wear lotions, powders, or perfumes, or deodorant.  Do not shave 48 hours prior to surgery.    Do not bring valuables to the hospital.  [facility] is not responsible for any belongings or valuables.  Contacts, dentures or bridgework may not be worn into surgery.  Leave your suitcase in the car.  After surgery it may be brought to your room.  For patients admitted to the hospital, discharge time will be determined by your treatment team.  Patients discharged the day of surgery will not be allowed to drive home.   Special instructions:  [place]- Preparing For Surgery  Before surgery, you can play an important role. Because skin is not sterile, your skin needs to be as free of germs as possible. You can reduce the number of germs on your skin by washing with CHG (chlorahexidine gluconate) Soap before surgery.  CHG is an  antiseptic cleaner which kills germs and bonds with the skin to continue killing germs even after washing.    Oral Hygiene is also important to reduce your risk of infection.  Remember - BRUSH YOUR TEETH THE MORNING OF SURGERY WITH YOUR REGULAR TOOTHPASTE  Please do not use if you have an allergy to CHG or antibacterial soaps. If your skin becomes reddened/irritated stop using the CHG.  Do not shave (including legs and underarms) for at least 48 hours prior to first CHG shower. It is OK to shave your face.  Please follow these instructions carefully.   1. Shower the NIGHT BEFORE SURGERY and the MORNING OF SURGERY with CHG.   2. If you chose to wash your hair, wash your hair first as usual with your normal shampoo.  3. After you shampoo, rinse your hair and body thoroughly to remove the shampoo.  4. Use CHG as you would any other liquid soap. You can apply CHG directly to the skin and wash gently with a scrungie or a clean washcloth.   5. Apply the CHG Soap to your body ONLY FROM THE NECK DOWN.  Do not use on open wounds or open sores. Avoid contact with your eyes, ears, mouth and genitals (private parts). Wash Face and genitals (private parts)  with your normal soap.  6. Wash thoroughly, paying special attention to the area where your surgery will be performed.  7. Thoroughly rinse your body with warm water from the neck down.  8. DO NOT shower/wash with your normal soap after  using and rinsing off the CHG Soap.  9. Pat yourself dry with a CLEAN TOWEL.  10. Wear CLEAN PAJAMAS to bed the night before surgery, wear comfortable clothes the morning of surgery  11. Place CLEAN SHEETS on your bed the night of your first shower and DO NOT SLEEP WITH PETS.  Day of Surgery:  Do not apply any deodorants/lotions.  Please wear clean clothes to the hospital/surgery center.   Remember to brush your teeth WITH YOUR REGULAR TOOTHPASTE.  Please read over the following fact sheets that you were  given. Pain Booklet, Coughing and Deep Breathing and Surgical Site Infection Prevention

## 2018-05-21 ENCOUNTER — Other Ambulatory Visit: Payer: Self-pay

## 2018-05-21 ENCOUNTER — Encounter (HOSPITAL_COMMUNITY): Payer: Self-pay

## 2018-05-21 ENCOUNTER — Encounter (HOSPITAL_COMMUNITY)
Admission: RE | Admit: 2018-05-21 | Discharge: 2018-05-21 | Disposition: A | Discharge status: Home / Self Care | Payer: Medicare Other | Source: Ambulatory Visit | Attending: Otolaryngology | Admitting: Otolaryngology

## 2018-05-21 DIAGNOSIS — Z01812 Encounter for preprocedural laboratory examination: Secondary | ICD-10-CM | POA: Insufficient documentation

## 2018-05-21 HISTORY — DX: Other diseases of larynx: J38.7

## 2018-05-21 LAB — BASIC METABOLIC PANEL
Anion gap: 9 (ref 5–15)
BUN: <5 mg/dL — ABNORMAL LOW (ref 6–20)
CHLORIDE: 102 mmol/L (ref 98–111)
CO2: 27 mmol/L (ref 22–32)
Calcium: 9.3 mg/dL (ref 8.9–10.3)
Creatinine, Ser: 0.7 mg/dL (ref 0.44–1.00)
GFR calc non Af Amer: >60 mL/min (ref >60)
Glucose, Bld: 97 mg/dL (ref 70–99)
Potassium: 3.7 mmol/L (ref 3.5–5.1)
SODIUM: 138 mmol/L (ref 135–145)

## 2018-05-21 LAB — CBC
HCT: 41.1 % (ref 36.0–46.0)
HEMOGLOBIN: 13.1 g/dL (ref 12.0–15.0)
MCH: 31.5 pg (ref 26.0–34.0)
MCHC: 31.9 g/dL (ref 30.0–36.0)
MCV: 98.8 fL (ref 80.0–100.0)
Platelets: 394 10*3/uL (ref 150–400)
RBC: 4.16 MIL/uL (ref 3.87–5.11)
RDW: 15.4 % (ref 11.5–15.5)
WBC: 7.3 10*3/uL (ref 4.0–10.5)
nRBC: 0 % (ref 0.0–0.2)

## 2018-05-21 NOTE — Progress Notes (Signed)
Pt denies any acute cardiopulmonary issues. Pt denies being under the care of a cardiologist. Pt denies having a stress test and cardiac cath. Pt denies recent labs. Pt verbalized understanding of all pre-op instructions. Pt chart forwarded to anesthesia for review of medical history (use of supplemental oxygen, COPD, laryngeal mass).

## 2018-05-22 NOTE — Progress Notes (Signed)
Anesthesia Chart Review:  Case:  [id number] Date/Time:  05/25/18 1015   Procedures:      DIRECT LARYNGOSCOPY (Bilateral ) - with BX     TRACHEOSTOMY (Bilateral )   Anesthesia type:  General   Pre-op diagnosis:  Laryngeal mass   Location:  [facility] OR ROOM 08 / [facility]   Surgeon:  Melissa Montane, MD      DISCUSSION: 57 yo female current smoker. Pertinent hx includes COPD on supplemental O2 (3L QHS and PRN during the day), Anxiety, Depression, HA, GERD, Laryngeal mass.  Per Dr. Janeice Robinson note 05/14/2018 in Care Everywhere "CT shows a very large mass encompassing the supraglottis with possible some subglottic extension. She has a node in the left neck. She also has multiple nodules in both sides of her lungs.... A tracheotomy would be the safest way to approach her situation."  Intubation may be more difficult due to supraglottic mass. Anticipate she can proceed as planned barring acute status change.  VS: BP 133/82   Pulse 92 Comment: taken manually  Temp 36.8 C   Resp 18   Ht 5\' 7"  (1.702 m)   Wt 46.1 kg   LMP 09/29/2003   SpO2 95%   BMI 15.91 kg/m   PROVIDERS: Hoyt Koch, MD is PCP last seen by Howard Pouch, DO 04/21/2018 - treated for bronchitis and AOM at that time  Marshell Garfinkel, MD is Pulmonologist, last seen 08/31/2017  LABS: Labs reviewed: Acceptable for surgery. (all labs ordered are listed, but only abnormal results are displayed)  Labs Reviewed  BASIC METABOLIC PANEL - Abnormal; Notable for the following components:      Result Value   BUN <5 (*)    All other components within normal limits  CBC     IMAGES: CT Neck 05/11/2018: IMPRESSION: 1. 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 Suspicious solitary 2.1 cm (long axis) lymph node at the left level II/III junction, suggestive of nodal metastasis. Additionally, note separate mild abnormal soft tissue thickening internal to the  cricoid ring on series 3, image 75. 2. Emphysema (ICD10-J43.9) with multiple new bilateral upper lung pulmonary nodules since the CT in February are suspicious for metastatic disease. A disseminated infection, small septic emboli, might also have this appearance but seems unlikely.  CHEST - 2 VIEW 12/15/2017  COMPARISON:  07/15/2017 and prior radiographs  FINDINGS: The cardiomediastinal silhouette is unremarkable.  COPD/emphysema changes again noted.  There is no evidence of focal airspace disease, pulmonary edema, suspicious pulmonary nodule/mass, pleural effusion, or pneumothorax.  No acute bony abnormalities are identified.  IMPRESSION: COPD/emphysema changes without evidence of acute cardiopulmonary disease.   EKG: 12/15/2017: Normal sinus rhythm. Borderline short PR interval. Borderline right axis deviation. No significant change since Dec 2018   CV: TTE 04/30/2010: Study Conclusions  - Left ventricle: The cavity size was normal. Wall thickness was  normal. Systolic function was normal. The estimated ejection  fraction was in the range of 60% to 65%. - Pulmonary arteries: PA peak pressure: 51mm Hg (S).  Past Medical History:  Diagnosis Date  . Anxiety   . Arthritis   . Cervical cancer ([place])    Discovered by pap smear, treated by Dr. Charlesetta Garibaldi with LEEP procedure.  Pap since that time has been normal.  . Chronic back pain     her CT of the head without contrast June 28, 2008- moderate to large right paracentral disc protrusion at C4-C5  resulting in a right C5 neuroforaminal stenosis and probably some degree of spinal stenosis. C. bilateral C6 neuroforaminal stenosis related to  degenerative hypertrophy, no acute fracture or loose pieces identifying the cervical spine, ligamentous injury is not excluded.    Marland Kitchen COPD (chronic obstructive pulmonary disease) ([place])   . Depression   . Fibromyalgia   . GERD (gastroesophageal reflux disease)  October 2011     Williams, esophagogram - tiny amount of gastroesophageal reflux otherwise unremarkable exam, done by Dr. Dellis Filbert  . Headache(784.0)   . Hemorrhoids   . Laryngeal mass   . Osteoporosis    T score L femur neck = -2.6 (02/21/12)    Past Surgical History:  Procedure Laterality Date  . ADENOIDECTOMY    . CATARACT EXTRACTION W/ INTRAOCULAR LENS  IMPLANT, BILATERAL    . CERVICAL CONE BIOPSY     leep procedure  . MULTIPLE EXTRACTIONS WITH ALVEOLOPLASTY  06/18/2012   Procedure: MULTIPLE EXTRACION WITH ALVEOLOPLASTY;  Surgeon: Gae Bon, DDS;  Location: [place];  Service: Oral Surgery;  Laterality: Bilateral;  . TONSILLECTOMY      MEDICATIONS: . ALPRAZolam (XANAX) 1 MG tablet  . amoxicillin-clavulanate (AUGMENTIN) 200-28.5 MG/5ML suspension  . azithromycin (ZITHROMAX) 500 MG tablet  . bisacodyl (DULCOLAX) 5 MG EC tablet  . esomeprazole (NEXIUM) 40 MG capsule  . ipratropium-albuterol (DUONEB) 0.5-2.5 (3) MG/3ML SOLN  . lactose free nutrition (BOOST) LIQD  . megestrol (MEGACE ES) 625 MG/5ML suspension  . Methylnaltrexone Bromide (RELISTOR) 150 MG TABS  . morphine (MS CONTIN) 100 MG 12 hr tablet  . morphine (MSIR) 15 MG tablet  . OXYGEN  . promethazine (PHENERGAN) 25 MG tablet  . Respiratory Therapy Supplies (FLUTTER) DEVI  . SPIRIVA HANDIHALER 18 MCG inhalation capsule  . SYMBICORT 160-4.5 MCG/ACT inhaler  . Tiotropium Bromide Monohydrate (SPIRIVA RESPIMAT) 2.5 MCG/ACT AERS   No current facility-administered medications for this encounter.      Wynonia Musty [facility]/Anesthesiology Phone [phone] 05/22/2018 12:00 PM

## 2018-05-23 ENCOUNTER — Encounter: Payer: Self-pay | Admitting: Internal Medicine

## 2018-05-23 NOTE — Progress Notes (Signed)
Palliative Care  Preliminary Documentation Patient Self Referral  Received a call from Ms. Mulhall re: new diagnosis of "throat cancer". She has multiple concerns about her plan of care including significant hesitation about surgical interventions, outcomes and what her illness trajectory will look like. Her understanding of her cancer is limited. [name] has for many years struggled with severe anxiety and chronic pain- she is worried about her ability to tolerate the treatments planned for her-she has poor social and financial support. She tells me she does not think she can tolerate surgery and the illness but her daughter is insisting that she go through with the plan. She has been told that she can be cured of this cancer, told this will include radiation, but cannot tell me other goals of care that have been discussed.  Assessment/Chart Review:  1. Currently no tissue diagnosis, but CT and laryngoscopy very concerning for carcinoma- I reviewed CT and I am MOST concerned about the multiple lung lesions that look like LUNG CANCER. If indeed she has lung cancer her prognosis and trajectory will be much different. At this time I do not have enough information to guide her in that discussion.  2. I recommend that we get a tissue diagnosis in minimally invasive way as possible. She is having trouble with swallowing an choking already so understand that resection and trach may be almost palliative unless lung ca is very advanced at which point I would recommend hospice care.  At this time there are many unknowns- Alanni is clear in her desire to have pain and anxiety control and she is also open to talking a less aggressive comfort care approach - "let nature take it's course" - should she proceed with surgery on Friday our team is available for inpatient consultation and to help with a symptom management plan.  I discussed DNR and Advance Care Planning with her- she does not want to be on life  support or be incapacitated for any length of time. She is worried that she and her daughter disagree on this- I encouraged her to complete legal documents and have a conversation with her daughter.  Will contact her PCP for help in care coordination and also reach out to ENT to get perspective on trajectory and if lung findings should be pursued before trach and neck dissection.  Lane Hacker, DO Palliative Medicine [phone]

## 2018-05-24 ENCOUNTER — Other Ambulatory Visit: Payer: Self-pay | Admitting: Otolaryngology

## 2018-05-25 ENCOUNTER — Other Ambulatory Visit: Payer: Self-pay

## 2018-05-25 ENCOUNTER — Encounter (HOSPITAL_COMMUNITY): Payer: Self-pay

## 2018-05-25 ENCOUNTER — Encounter (HOSPITAL_COMMUNITY)
Admission: AD | Disposition: A | Discharge status: Home Health | Payer: Self-pay | Source: Home / Self Care | Attending: Otolaryngology

## 2018-05-25 ENCOUNTER — Inpatient Hospital Stay (HOSPITAL_COMMUNITY)
Admission: AD | Admit: 2018-05-25 | Discharge: 2018-05-31 | DRG: 011 | Disposition: A | Discharge status: Home Health | Payer: Medicare Other | Attending: Internal Medicine | Admitting: Internal Medicine

## 2018-05-25 ENCOUNTER — Ambulatory Visit (HOSPITAL_COMMUNITY): Payer: Medicare Other | Admitting: Physician Assistant

## 2018-05-25 ENCOUNTER — Inpatient Hospital Stay (HOSPITAL_COMMUNITY): Payer: Medicare Other

## 2018-05-25 ENCOUNTER — Ambulatory Visit (HOSPITAL_COMMUNITY): Payer: Medicare Other | Admitting: Anesthesiology

## 2018-05-25 DIAGNOSIS — J982 Interstitial emphysema: Secondary | ICD-10-CM

## 2018-05-25 DIAGNOSIS — J387 Other diseases of larynx: Secondary | ICD-10-CM | POA: Diagnosis not present

## 2018-05-25 DIAGNOSIS — J189 Pneumonia, unspecified organism: Secondary | ICD-10-CM | POA: Diagnosis not present

## 2018-05-25 DIAGNOSIS — G8921 Chronic pain due to trauma: Secondary | ICD-10-CM | POA: Diagnosis present

## 2018-05-25 DIAGNOSIS — F410 Panic disorder [episodic paroxysmal anxiety] without agoraphobia: Secondary | ICD-10-CM | POA: Diagnosis present

## 2018-05-25 DIAGNOSIS — M797 Fibromyalgia: Secondary | ICD-10-CM | POA: Diagnosis present

## 2018-05-25 DIAGNOSIS — G893 Neoplasm related pain (acute) (chronic): Secondary | ICD-10-CM | POA: Diagnosis present

## 2018-05-25 DIAGNOSIS — J449 Chronic obstructive pulmonary disease, unspecified: Secondary | ICD-10-CM | POA: Diagnosis present

## 2018-05-25 DIAGNOSIS — J95811 Postprocedural pneumothorax: Secondary | ICD-10-CM | POA: Diagnosis not present

## 2018-05-25 DIAGNOSIS — Z8249 Family history of ischemic heart disease and other diseases of the circulatory system: Secondary | ICD-10-CM

## 2018-05-25 DIAGNOSIS — F418 Other specified anxiety disorders: Secondary | ICD-10-CM | POA: Diagnosis present

## 2018-05-25 DIAGNOSIS — Z8541 Personal history of malignant neoplasm of cervix uteri: Secondary | ICD-10-CM | POA: Diagnosis not present

## 2018-05-25 DIAGNOSIS — M810 Age-related osteoporosis without current pathological fracture: Secondary | ICD-10-CM | POA: Diagnosis present

## 2018-05-25 DIAGNOSIS — Z0289 Encounter for other administrative examinations: Secondary | ICD-10-CM

## 2018-05-25 DIAGNOSIS — C329 Malignant neoplasm of larynx, unspecified: Secondary | ICD-10-CM

## 2018-05-25 DIAGNOSIS — K219 Gastro-esophageal reflux disease without esophagitis: Secondary | ICD-10-CM | POA: Diagnosis present

## 2018-05-25 DIAGNOSIS — F17210 Nicotine dependence, cigarettes, uncomplicated: Secondary | ICD-10-CM | POA: Diagnosis present

## 2018-05-25 DIAGNOSIS — C321 Malignant neoplasm of supraglottis: Principal | ICD-10-CM | POA: Diagnosis present

## 2018-05-25 DIAGNOSIS — Y95 Nosocomial condition: Secondary | ICD-10-CM | POA: Diagnosis not present

## 2018-05-25 DIAGNOSIS — F1120 Opioid dependence, uncomplicated: Secondary | ICD-10-CM | POA: Diagnosis present

## 2018-05-25 DIAGNOSIS — Z79899 Other long term (current) drug therapy: Secondary | ICD-10-CM | POA: Diagnosis not present

## 2018-05-25 DIAGNOSIS — Z833 Family history of diabetes mellitus: Secondary | ICD-10-CM | POA: Diagnosis not present

## 2018-05-25 DIAGNOSIS — Z515 Encounter for palliative care: Secondary | ICD-10-CM | POA: Diagnosis not present

## 2018-05-25 DIAGNOSIS — K5903 Drug induced constipation: Secondary | ICD-10-CM | POA: Diagnosis not present

## 2018-05-25 DIAGNOSIS — T402X5A Adverse effect of other opioids, initial encounter: Secondary | ICD-10-CM | POA: Diagnosis not present

## 2018-05-25 DIAGNOSIS — Z930 Tracheostomy status: Secondary | ICD-10-CM | POA: Diagnosis not present

## 2018-05-25 DIAGNOSIS — J939 Pneumothorax, unspecified: Secondary | ICD-10-CM

## 2018-05-25 DIAGNOSIS — R0902 Hypoxemia: Secondary | ICD-10-CM | POA: Diagnosis not present

## 2018-05-25 DIAGNOSIS — Z66 Do not resuscitate: Secondary | ICD-10-CM | POA: Diagnosis not present

## 2018-05-25 DIAGNOSIS — Z7951 Long term (current) use of inhaled steroids: Secondary | ICD-10-CM

## 2018-05-25 DIAGNOSIS — Z9981 Dependence on supplemental oxygen: Secondary | ICD-10-CM

## 2018-05-25 DIAGNOSIS — E43 Unspecified severe protein-calorie malnutrition: Secondary | ICD-10-CM | POA: Diagnosis present

## 2018-05-25 DIAGNOSIS — Z681 Body mass index (BMI) 19 or less, adult: Secondary | ICD-10-CM | POA: Diagnosis not present

## 2018-05-25 DIAGNOSIS — Z801 Family history of malignant neoplasm of trachea, bronchus and lung: Secondary | ICD-10-CM

## 2018-05-25 DIAGNOSIS — Z7952 Long term (current) use of systemic steroids: Secondary | ICD-10-CM

## 2018-05-25 DIAGNOSIS — R918 Other nonspecific abnormal finding of lung field: Secondary | ICD-10-CM

## 2018-05-25 DIAGNOSIS — R05 Cough: Secondary | ICD-10-CM | POA: Diagnosis not present

## 2018-05-25 DIAGNOSIS — Z823 Family history of stroke: Secondary | ICD-10-CM

## 2018-05-25 DIAGNOSIS — A419 Sepsis, unspecified organism: Secondary | ICD-10-CM | POA: Diagnosis not present

## 2018-05-25 DIAGNOSIS — F411 Generalized anxiety disorder: Secondary | ICD-10-CM | POA: Diagnosis present

## 2018-05-25 DIAGNOSIS — E569 Vitamin deficiency, unspecified: Secondary | ICD-10-CM | POA: Diagnosis present

## 2018-05-25 DIAGNOSIS — R0989 Other specified symptoms and signs involving the circulatory and respiratory systems: Secondary | ICD-10-CM | POA: Diagnosis not present

## 2018-05-25 DIAGNOSIS — E876 Hypokalemia: Secondary | ICD-10-CM | POA: Diagnosis not present

## 2018-05-25 DIAGNOSIS — J439 Emphysema, unspecified: Secondary | ICD-10-CM | POA: Diagnosis not present

## 2018-05-25 HISTORY — PX: DIRECT LARYNGOSCOPY: SHX5326

## 2018-05-25 HISTORY — PX: TRACHEOSTOMY TUBE PLACEMENT: SHX814

## 2018-05-25 LAB — CREATININE, SERUM
Creatinine, Ser: 0.65 mg/dL (ref 0.44–1.00)
GFR calc Af Amer: >60 mL/min (ref >60)

## 2018-05-25 SURGERY — LARYNGOSCOPY, DIRECT
Anesthesia: General | Site: Neck | Laterality: Bilateral

## 2018-05-25 MED ORDER — OXYMETAZOLINE HCL 0.05 % NA SOLN
NASAL | Status: AC
  Filled 2018-05-25: qty 15

## 2018-05-25 MED ORDER — ROCURONIUM BROMIDE 50 MG/5ML IV SOSY
PREFILLED_SYRINGE | INTRAVENOUS | Status: DC | PRN
  Administered 2018-05-25: 20 mg via INTRAVENOUS

## 2018-05-25 MED ORDER — ONDANSETRON HCL 4 MG/2ML IJ SOLN
INTRAMUSCULAR | Status: DC | PRN
  Administered 2018-05-25: 4 mg via INTRAVENOUS

## 2018-05-25 MED ORDER — ROCURONIUM BROMIDE 50 MG/5ML IV SOSY
PREFILLED_SYRINGE | INTRAVENOUS | Status: AC
  Filled 2018-05-25: qty 5

## 2018-05-25 MED ORDER — PROPOFOL 10 MG/ML IV BOLUS
INTRAVENOUS | Status: DC | PRN
  Administered 2018-05-25: 30 mg via INTRAVENOUS
  Administered 2018-05-25: 20 mg via INTRAVENOUS

## 2018-05-25 MED ORDER — KETAMINE HCL 10 MG/ML IJ SOLN
INTRAMUSCULAR | Status: DC | PRN
  Administered 2018-05-25: 20 mg via INTRAVENOUS

## 2018-05-25 MED ORDER — HEPARIN SODIUM (PORCINE) 5000 UNIT/ML IJ SOLN
5000.0000 [IU] | Freq: Three times a day (TID) | INTRAMUSCULAR | Status: DC
  Administered 2018-05-25 – 2018-05-31 (×17): 5000 [IU] via SUBCUTANEOUS
  Filled 2018-05-25 (×17): qty 1

## 2018-05-25 MED ORDER — CHLORHEXIDINE GLUCONATE CLOTH 2 % EX PADS
6.0000 | MEDICATED_PAD | Freq: Once | CUTANEOUS | Status: DC
  Administered 2018-05-25: 6 via TOPICAL

## 2018-05-25 MED ORDER — SUGAMMADEX SODIUM 200 MG/2ML IV SOLN
INTRAVENOUS | Status: DC | PRN
  Administered 2018-05-25: 100 mg via INTRAVENOUS

## 2018-05-25 MED ORDER — FENTANYL CITRATE (PF) 250 MCG/5ML IJ SOLN
INTRAMUSCULAR | Status: AC
  Filled 2018-05-25: qty 5

## 2018-05-25 MED ORDER — PROPOFOL 500 MG/50ML IV EMUL
INTRAVENOUS | Status: DC | PRN
  Administered 2018-05-25: 75 ug/kg/min via INTRAVENOUS

## 2018-05-25 MED ORDER — DEXMEDETOMIDINE HCL 200 MCG/2ML IV SOLN
INTRAVENOUS | Status: DC | PRN
  Administered 2018-05-25: 4 ug via INTRAVENOUS

## 2018-05-25 MED ORDER — 0.9 % SODIUM CHLORIDE (POUR BTL) OPTIME
TOPICAL | Status: DC | PRN
  Administered 2018-05-25: 1000 mL

## 2018-05-25 MED ORDER — BOOST PO LIQD
237.0000 mL | Freq: Every day | ORAL | Status: DC
  Administered 2018-05-31: 237 mL via ORAL
  Filled 2018-05-25 (×7): qty 237

## 2018-05-25 MED ORDER — LACTATED RINGERS IV SOLN
INTRAVENOUS | Status: DC
  Administered 2018-05-25: 09:00:00 via INTRAVENOUS

## 2018-05-25 MED ORDER — MORPHINE SULFATE 15 MG PO TABS: 15.0000 mg | ORAL_TABLET | Freq: Two times a day (BID) | ORAL | Status: DC | PRN

## 2018-05-25 MED ORDER — HYDROMORPHONE HCL 1 MG/ML IJ SOLN
0.2500 mg | INTRAMUSCULAR | Status: DC | PRN
  Administered 2018-05-25 (×2): 0.5 mg via INTRAVENOUS

## 2018-05-25 MED ORDER — MORPHINE SULFATE (PF) 2 MG/ML IV SOLN
2.0000 mg | INTRAVENOUS | Status: DC | PRN
  Administered 2018-05-25: 2 mg via INTRAVENOUS
  Filled 2018-05-25: qty 1

## 2018-05-25 MED ORDER — LORAZEPAM 2 MG/ML IJ SOLN
1.0000 mg | INTRAMUSCULAR | Status: DC | PRN
  Administered 2018-05-27 – 2018-05-28 (×3): 1 mg via INTRAVENOUS
  Filled 2018-05-25 (×2): qty 1

## 2018-05-25 MED ORDER — KETAMINE HCL 50 MG/5ML IJ SOSY
PREFILLED_SYRINGE | INTRAMUSCULAR | Status: AC
  Filled 2018-05-25: qty 10

## 2018-05-25 MED ORDER — MIDAZOLAM HCL 2 MG/2ML IJ SOLN
INTRAMUSCULAR | Status: AC
  Filled 2018-05-25: qty 2

## 2018-05-25 MED ORDER — ONDANSETRON HCL 4 MG/2ML IJ SOLN
INTRAMUSCULAR | Status: AC
  Filled 2018-05-25: qty 2

## 2018-05-25 MED ORDER — BISACODYL 5 MG PO TBEC: 5.0000 mg | DELAYED_RELEASE_TABLET | Freq: Every day | ORAL | Status: DC | PRN

## 2018-05-25 MED ORDER — HYDROMORPHONE HCL 1 MG/ML IJ SOLN
1.0000 mg | INTRAMUSCULAR | Status: AC
  Administered 2018-05-25 – 2018-05-27 (×23): 1 mg via INTRAVENOUS
  Filled 2018-05-25 (×23): qty 1

## 2018-05-25 MED ORDER — OXYMETAZOLINE HCL 0.05 % NA SOLN
NASAL | Status: DC | PRN
  Administered 2018-05-25: 1

## 2018-05-25 MED ORDER — PROMETHAZINE HCL 25 MG PO TABS: 25.0000 mg | ORAL_TABLET | Freq: Three times a day (TID) | ORAL | Status: DC | PRN

## 2018-05-25 MED ORDER — PROMETHAZINE HCL 25 MG/ML IJ SOLN: 6.2500 mg | INTRAMUSCULAR | Status: DC | PRN

## 2018-05-25 MED ORDER — LORAZEPAM 2 MG/ML IJ SOLN
1.0000 mg | Freq: Every day | INTRAMUSCULAR | Status: DC
  Administered 2018-05-25 – 2018-05-27 (×3): 1 mg via INTRAVENOUS
  Filled 2018-05-25 (×3): qty 1

## 2018-05-25 MED ORDER — PROPOFOL 10 MG/ML IV BOLUS
INTRAVENOUS | Status: AC
  Filled 2018-05-25: qty 20

## 2018-05-25 MED ORDER — MORPHINE SULFATE ER 100 MG PO TBCR: 100.0000 mg | EXTENDED_RELEASE_TABLET | Freq: Two times a day (BID) | ORAL | Status: DC

## 2018-05-25 MED ORDER — EPINEPHRINE HCL (NASAL) 0.1 % NA SOLN
NASAL | Status: AC
  Filled 2018-05-25: qty 30

## 2018-05-25 MED ORDER — ONDANSETRON 4 MG PO TBDP: 4.0000 mg | ORAL_TABLET | Freq: Four times a day (QID) | ORAL | Status: DC | PRN

## 2018-05-25 MED ORDER — ONDANSETRON HCL 4 MG/2ML IJ SOLN
4.0000 mg | Freq: Four times a day (QID) | INTRAMUSCULAR | Status: DC | PRN
  Administered 2018-05-26 – 2018-05-28 (×3): 4 mg via INTRAVENOUS
  Filled 2018-05-25 (×3): qty 2

## 2018-05-25 MED ORDER — LIDOCAINE-EPINEPHRINE 1 %-1:100000 IJ SOLN
INTRAMUSCULAR | Status: AC
  Filled 2018-05-25: qty 1

## 2018-05-25 MED ORDER — LIDOCAINE-EPINEPHRINE 1 %-1:100000 IJ SOLN
INTRAMUSCULAR | Status: DC | PRN
  Administered 2018-05-25: 7 mL

## 2018-05-25 MED ORDER — HYDROMORPHONE HCL 1 MG/ML IJ SOLN
INTRAMUSCULAR | Status: AC
  Administered 2018-05-25: 0.5 mg via INTRAVENOUS
  Filled 2018-05-25: qty 1

## 2018-05-25 MED ORDER — DEXAMETHASONE SODIUM PHOSPHATE 10 MG/ML IJ SOLN
INTRAMUSCULAR | Status: AC
  Filled 2018-05-25: qty 1

## 2018-05-25 MED ORDER — MIDAZOLAM HCL 5 MG/5ML IJ SOLN
INTRAMUSCULAR | Status: DC | PRN
  Administered 2018-05-25: 2 mg via INTRAVENOUS

## 2018-05-25 MED ORDER — LEVALBUTEROL HCL 0.63 MG/3ML IN NEBU
0.6300 mg | INHALATION_SOLUTION | Freq: Once | RESPIRATORY_TRACT | Status: AC
  Administered 2018-05-25: 0.63 mg via RESPIRATORY_TRACT
  Filled 2018-05-25 (×2): qty 3

## 2018-05-25 MED ORDER — IPRATROPIUM-ALBUTEROL 0.5-2.5 (3) MG/3ML IN SOLN: 3.0000 mL | Freq: Four times a day (QID) | RESPIRATORY_TRACT | Status: DC | PRN

## 2018-05-25 MED ORDER — MOMETASONE FURO-FORMOTEROL FUM 200-5 MCG/ACT IN AERO
2.0000 | INHALATION_SPRAY | Freq: Two times a day (BID) | RESPIRATORY_TRACT | Status: DC
  Administered 2018-05-30 – 2018-05-31 (×3): 2 via RESPIRATORY_TRACT
  Filled 2018-05-25: qty 8.8

## 2018-05-25 MED ORDER — ALPRAZOLAM 0.5 MG PO TABS: 1.0000 mg | ORAL_TABLET | Freq: Three times a day (TID) | ORAL | Status: DC | PRN

## 2018-05-25 MED ORDER — HYDROMORPHONE HCL 1 MG/ML IJ SOLN: 1.0000 mg | INTRAMUSCULAR | Status: DC

## 2018-05-25 MED ORDER — DEXTROSE-NACL 5-0.45 % IV SOLN
INTRAVENOUS | Status: DC
  Administered 2018-05-25 – 2018-05-29 (×6): via INTRAVENOUS

## 2018-05-25 MED ORDER — TIOTROPIUM BROMIDE MONOHYDRATE 18 MCG IN CAPS
18.0000 ug | ORAL_CAPSULE | Freq: Every day | RESPIRATORY_TRACT | Status: DC
  Administered 2018-05-30 – 2018-05-31 (×2): 18 ug via RESPIRATORY_TRACT
  Filled 2018-05-25: qty 5

## 2018-05-25 SURGICAL SUPPLY — 45 items
BLADE CLIPPER SURG (BLADE) IMPLANT
BLADE SURG 15 STRL LF DISP TIS (BLADE) IMPLANT
BLADE SURG 15 STRL SS (BLADE)
CANISTER SUCT 3000ML PPV (MISCELLANEOUS) ×4 IMPLANT
CLEANER TIP ELECTROSURG 2X2 (MISCELLANEOUS) ×4 IMPLANT
CLOSURE WOUND 1/2 X4 (GAUZE/BANDAGES/DRESSINGS)
COVER BACK TABLE 60X90IN (DRAPES) ×4 IMPLANT
COVER MAYO STAND STRL (DRAPES) ×4 IMPLANT
COVER SURGICAL LIGHT HANDLE (MISCELLANEOUS) ×4 IMPLANT
COVER WAND RF STERILE (DRAPES) ×4 IMPLANT
CRADLE DONUT ADULT HEAD (MISCELLANEOUS) IMPLANT
DRAPE HALF SHEET 40X57 (DRAPES) ×4 IMPLANT
ELECT COATED BLADE 2.86 ST (ELECTRODE) ×4 IMPLANT
ELECT REM PT RETURN 9FT ADLT (ELECTROSURGICAL) ×4
ELECTRODE REM PT RTRN 9FT ADLT (ELECTROSURGICAL) ×2 IMPLANT
GAUZE 4X4 16PLY RFD (DISPOSABLE) ×4 IMPLANT
GLOVE ECLIPSE 7.5 STRL STRAW (GLOVE) ×8 IMPLANT
GOWN STRL REUS W/ TWL LRG LVL3 (GOWN DISPOSABLE) ×4 IMPLANT
GOWN STRL REUS W/TWL LRG LVL3 (GOWN DISPOSABLE) ×4
GUARD TEETH (MISCELLANEOUS) IMPLANT
HOLDER TRACH TUBE VELCRO 19.5 (MISCELLANEOUS) ×4 IMPLANT
KIT BASIN OR (CUSTOM PROCEDURE TRAY) ×4 IMPLANT
KIT TURNOVER KIT B (KITS) ×4 IMPLANT
NEEDLE HYPO 25GX1X1/2 BEV (NEEDLE) ×4 IMPLANT
NS IRRIG 1000ML POUR BTL (IV SOLUTION) ×4 IMPLANT
PAD ARMBOARD 7.5X6 YLW CONV (MISCELLANEOUS) ×8 IMPLANT
PATTIES SURGICAL .5 X3 (DISPOSABLE) IMPLANT
PENCIL FOOT CONTROL (ELECTRODE) ×4 IMPLANT
SOLUTION ANTI FOG 6CC (MISCELLANEOUS) IMPLANT
SPECIMEN JAR SMALL (MISCELLANEOUS) IMPLANT
SPONGE DRAIN TRACH 4X4 STRL 2S (GAUZE/BANDAGES/DRESSINGS) IMPLANT
STRIP CLOSURE SKIN 1/2X4 (GAUZE/BANDAGES/DRESSINGS) IMPLANT
SURGILUBE 2OZ TUBE FLIPTOP (MISCELLANEOUS) IMPLANT
SUT CHROMIC GUT 2 0 PS 2 27 (SUTURE) ×4 IMPLANT
SUT ETHILON 3 0 PS 1 (SUTURE) ×4 IMPLANT
SUT SILK 3 0 SH 30 (SUTURE) ×4 IMPLANT
SUT SILK 3 0 TIES 17X18 (SUTURE) ×2
SUT SILK 3-0 18XBRD TIE BLK (SUTURE) ×2 IMPLANT
SYR CONTROL 10ML LL (SYRINGE) ×4 IMPLANT
TOWEL OR 17X24 6PK STRL BLUE (TOWEL DISPOSABLE) ×8 IMPLANT
TRAY ENT MC OR (CUSTOM PROCEDURE TRAY) ×4 IMPLANT
TUBE CONNECTING 12'X1/4 (SUCTIONS) ×1
TUBE CONNECTING 12X1/4 (SUCTIONS) ×3 IMPLANT
TUBE TRACH SHILEY  6 DIST  CUF (TUBING) ×4 IMPLANT
WATER STERILE IRR 1000ML POUR (IV SOLUTION) ×4 IMPLANT

## 2018-05-25 NOTE — Op Note (Signed)
/  postop diagnosis: Supraglottic carcinoma. Procedure: Tracheotomy and direct laryngoscopy with biopsy Anesthesia: Gen. Estimated blood loss: Less than 5 mL Indications: 57 year old who's had a pain in her larynx throat that has now been diagnosed with fiberoptic scope with a large mass of the left supraglottic area. 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 She has a very narrow airway and hoarseness with this tumor and its concerned that any procedure that is done subsequently with making diagnosis of her lung lesions will be risk for airway obstruction. Also waking her up after anesthesia may be difficult. Patient deftly once a tracheotomy and direct laryngoscopy. We discussed the risks, benefits, and options. All the questions are answered and consent was obtained.  Operation: Patient taken the operative and placed in the supine position after prepped and draped in usual sterile manner the patient was prepared for local tracheotomy. The outline of the cricoid was made and a vertical incision marking was made. She was injected with 1% lidocaine with 1 100,000 epinephrine.a vertical incision was performed dissecting down to the division the strap muscles. The strap muscles were divided. The isthmus of the thyroid is identified and divided with electrocautery. The second third tracheal ring was identified and inferior base flap was created and secured inferiorly with a 3-0 chromic. Thewas then placed #6 without difficulty. End-tidal CO2 return was obtained. It was secured with 3-0 silk. A Velcro trach collar. She was ventilating well. Traction was then carried to the direct laryngoscopy where the Dedo scope was inserted and the airway was examined. The tumor was very exophytic and pieces of tumor were laying in her pharynx. The glottis was covered by the tumor but it was not adherent. It was apparently attached to the arytenoid area epiglottic fold area. It extended  to the posterior aspect the arytenoid but not into the cricopharyngeus region. It looked clear. There was no other lesions identified. Several biopsies were taken from the mass and hemostasis achieved with adrenaline-soaked pledgets. The Dedo scope was removed the patient is awake and brought to recovery in stable condition counts correct

## 2018-05-25 NOTE — H&P (Signed)
[name] is an 57 y.o. female.   Chief Complaint: tumor of larynx HPI: hx of hoarseness and pain in throat. She has large mass in the left supraglottis.   Past Medical History:  Diagnosis Date  . Anxiety   . Arthritis   . Cervical cancer ([place])    Discovered by pap smear, treated by Dr. Charlesetta Garibaldi with LEEP procedure.  Pap since that time has been normal.  . Chronic back pain     her CT of the head without contrast June 28, 2008- moderate to large right paracentral disc protrusion at C4-C5 resulting in a right C5 neuroforaminal stenosis and probably some degree of spinal stenosis. C. bilateral C6 neuroforaminal stenosis related to  degenerative hypertrophy, no acute fracture or loose pieces identifying the cervical spine, ligamentous injury is not excluded.    Marland Kitchen COPD (chronic obstructive pulmonary disease) ([place])   . Depression   . Fibromyalgia   . GERD (gastroesophageal reflux disease)  October 2011    Williams, esophagogram - tiny amount of gastroesophageal reflux otherwise unremarkable exam, done by Dr. Dellis Filbert  . Headache(784.0)   . Hemorrhoids   . Laryngeal mass   . Osteoporosis    T score L femur neck = -2.6 (02/21/12)    Past Surgical History:  Procedure Laterality Date  . ADENOIDECTOMY    . CATARACT EXTRACTION W/ INTRAOCULAR LENS  IMPLANT, BILATERAL    . CERVICAL CONE BIOPSY     leep procedure  . MULTIPLE EXTRACTIONS WITH ALVEOLOPLASTY  06/18/2012   Procedure: MULTIPLE EXTRACION WITH ALVEOLOPLASTY;  Surgeon: Gae Bon, DDS;  Location: [place];  Service: Oral Surgery;  Laterality: Bilateral;  . TONSILLECTOMY      Family History  Problem Relation Age of Onset  . Stroke Mother   . Diabetes Mother   . Heart disease Father   . Lung cancer Brother        smoker   Social History:  reports that she has been smoking cigarettes. She has a 40.00 pack-year smoking history. She has never used smokeless tobacco. She reports that she does not drink alcohol or use  drugs.  Allergies:  Allergies  Allergen Reactions  . Aripiprazole Other (See Comments)    Suicidal ideation, also with SSRI  . Chantix [Varenicline] Other (See Comments)    Irritability and depressed mood  . Pregabalin Hives    On trunk of body  . Zolpidem Tartrate Other (See Comments)    UNKNOWN  Pt states she does not know about this allergy     Medications Prior to Admission  Medication Sig Dispense Refill  . ALPRAZolam (XANAX) 1 MG tablet TAKE 1 TABLET BY MOUTH 4 TIMES A DAY AS NEEDED FOR ANXIETY (Patient taking differently: Take 1 mg by mouth 3 (three) times daily as needed for anxiety. TAKE 1 TABLET BY MOUTH 4 TIMES A DAY AS NEEDED FOR ANXIETY) 120 tablet 3  . bisacodyl (DULCOLAX) 5 MG EC tablet Take 5 mg by mouth daily as needed for mild constipation or moderate constipation.    Marland Kitchen esomeprazole (NEXIUM) 40 MG capsule TAKE ONE CAPSULE BY MOUTH TWICE A DAY BEFORE A MEAL (Patient taking differently: Take 40 mg by mouth 2 (two) times daily before a meal. TAKE ONE CAPSULE BY MOUTH TWICE A DAY BEFORE A MEAL) 180 capsule 1  . ipratropium-albuterol (DUONEB) 0.5-2.5 (3) MG/3ML SOLN Take 3 mLs by nebulization every 6 (six) hours as needed. 360 mL 3  . lactose free nutrition (BOOST) LIQD Take 237  mLs by mouth daily. Vanilla     . morphine (MS CONTIN) 100 MG 12 hr tablet Take 1 tablet (100 mg total) by mouth every 12 (twelve) hours. 60 tablet 0  . morphine (MSIR) 15 MG tablet Take 1 tablet (15 mg total) by mouth 2 (two) times daily as needed for severe pain. 60 tablet 0  . OXYGEN Inhale 3 L/min into the lungs See admin instructions. Continuous while sleeping and as needed throughout the day during physical activity    . promethazine (PHENERGAN) 25 MG tablet TAKE 1 TABLET (25 MG TOTAL) BY MOUTH EVERY 8 (EIGHT) HOURS AS NEEDED FOR NAUSEA OR VOMITING. 60 tablet 0  . SYMBICORT 160-4.5 MCG/ACT inhaler TAKE 2 PUFFS BY MOUTH TWICE A DAY (Patient taking differently: Inhale 2 puffs into the lungs 2 (two)  times daily. ) 30.6 Inhaler 2  . Tiotropium Bromide Monohydrate (SPIRIVA RESPIMAT) 2.5 MCG/ACT AERS Inhale 2 puffs into the lungs daily. 1 Inhaler 6  . amoxicillin-clavulanate (AUGMENTIN) 200-28.5 MG/5ML suspension Take 21.9 mLs (876 mg total) by mouth 2 (two) times daily. (Patient not taking: Reported on 05/15/2018) 225 mL 0  . azithromycin (ZITHROMAX) 500 MG tablet 500 QD x 3days. (Patient not taking: Reported on 05/15/2018) 3 tablet 0  . megestrol (MEGACE ES) 625 MG/5ML suspension TAKE 5 MLS (625 MG TOTAL) BY MOUTH DAILY. (Patient taking differently: Take 625 mg by mouth 2 (two) times a week. ) 450 mL 1  . Methylnaltrexone Bromide (RELISTOR) 150 MG TABS Take 150 mg by mouth daily. (Patient not taking: Reported on 05/15/2018) 90 tablet 3  . Respiratory Therapy Supplies (FLUTTER) DEVI USE AS DIRECTED 1 each 0  . SPIRIVA HANDIHALER 18 MCG inhalation capsule PLACE 1 CAPSULE INTO INHALER AND INHALE DAILY (Patient not taking: Reported on 05/15/2018) 30 capsule 10    No results found for this or any previous visit (from the past 48 hour(s)). No results found.  Review of Systems  Constitutional: Negative.   HENT: Negative.   Eyes: Negative.   Respiratory: Negative.   Cardiovascular: Negative.   Skin: Negative.     Blood pressure 127/71, pulse (!) 109, temperature 99.4 F (37.4 C), resp. rate 20, height 5\' 7"  (1.702 m), weight 46.1 kg, last menstrual period 09/29/2003, SpO2 95 %. Physical Exam  Constitutional: She appears well-developed.  HENT:  Head: Normocephalic and atraumatic.  Mouth/Throat: Oropharynx is clear and moist.  Eyes: Pupils are equal, round, and reactive to light. Conjunctivae are normal.  Neck: Normal range of motion. Neck supple.  Cardiovascular: Normal rate.  Respiratory: Effort normal.  GI: Soft.  Musculoskeletal: Normal range of motion.     Assessment/Plan Supraglottic mass- discussed procedure and tracheotomy with patient and daughter. They want to proceed with  both to protect her airway through the treatment and need to make a diagnosis of the lung masses she has as well.   Melissa Montane, MD 05/25/2018, 10:17 AM

## 2018-05-25 NOTE — Anesthesia Procedure Notes (Signed)
Performed by: Scheryl Darter, CRNA Comments: Awake trach by Dr Janace Hoard

## 2018-05-25 NOTE — Anesthesia Preprocedure Evaluation (Signed)
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 Suspicious solitary 2.1 cm (long axis) lymph node at the left level II/III junction, suggestive of nodal metastasis. Additionally, note separate mild abnormal soft tissue thickening internal to the cricoid ring on series 3, image 75. 2. Emphysema (ICD10-J43.9) with multiple new bilateral upper lung pulmonary nodules since the CT in February are suspicious for metastatic disease. A disseminated infection, small septic emboli, might also have this appearance but seems unlikely.   Reviewed: Allergy & Precautions, NPO status , Patient's Chart, lab work & pertinent test results  Airway Mallampati: II  TM Distance: >3 FB Neck ROM: Full    Dental no notable dental hx.    Pulmonary COPD, Current Smoker,    Pulmonary exam normal breath sounds clear to auscultation       Cardiovascular negative cardio ROS Normal cardiovascular exam Rhythm:Regular Rate:Normal     Neuro/Psych negative neurological ROS  negative psych ROS   GI/Hepatic negative GI ROS, Neg liver ROS,   Endo/Other  negative endocrine ROS  Renal/GU negative Renal ROS  negative genitourinary   Musculoskeletal negative musculoskeletal ROS (+)   Abdominal   Peds negative pediatric ROS (+)  Hematology negative hematology ROS (+)   Anesthesia Other Findings   Reproductive/Obstetrics negative OB ROS                             Anesthesia Physical Anesthesia Plan  ASA: IV  Anesthesia Plan: General   Post-op Pain Management:    Induction: Intravenous  PONV Risk Score and Plan: 2 and Ondansetron, Dexamethasone and Treatment may vary due to age or medical condition  Airway Management Planned: Oral  ETT and Tracheostomy  Additional Equipment:   Intra-op Plan:   Post-operative Plan: Extubation in OR  Informed Consent: I have reviewed the patients History and Physical, chart, labs and discussed the procedure including the risks, benefits and alternatives for the proposed anesthesia with the patient or authorized representative who has indicated his/her understanding and acceptance.   Dental advisory given  Plan Discussed with: CRNA and Surgeon  Anesthesia Plan Comments:         Anesthesia Quick Evaluation

## 2018-05-25 NOTE — Progress Notes (Signed)
Patient febrile of 99.4 and HR 105-115, O2 87% RA, patient placed on 3L O2 and O2 went up to 95%.  Dr. Janace Hoard notified.  Dr. Kalman Shan notified.  No new orders received.  Will continue to monitor patient.

## 2018-05-25 NOTE — Transfer of Care (Signed)
Immediate Anesthesia Transfer of Care Note  Patient: [name]  Procedure(s) Performed: DIRECT LARYNGOSCOPY (Bilateral Mouth) TRACHEOSTOMY (Bilateral Neck)  Patient Location: PACU  Anesthesia Type:General  Level of Consciousness: awake, alert , oriented and sedated  Airway & Oxygen Therapy: Patient Spontanous Breathing and Patient connected to tracheostomy mask oxygen  Post-op Assessment: Report given to RN, Post -op Vital signs reviewed and stable and Patient moving all extremities  Post vital signs: Reviewed and stable  Last Vitals:  Vitals Value Taken Time  BP 131/71 05/25/2018 11:22 AM  Temp 36.5 C 05/25/2018 11:22 AM  Pulse 117 05/25/2018 11:25 AM  Resp 41 05/25/2018 11:25 AM  SpO2 95 % 05/25/2018 11:25 AM  Vitals shown include unvalidated device data.  Last Pain:  Vitals:   05/25/18 1122  PainSc: (P) Asleep      Patients Stated Pain Goal: 0 (39/67/28 9791)  Complications: No apparent anesthesia complications

## 2018-05-25 NOTE — Progress Notes (Signed)
Mrs. 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 She has been declining rapidly over the past year, significant weight loss, depression and ongoing tobacco abuse and worsening COPD. She has chronic pain from a remote C-spine injury and degenerative arthritis and is on long standing opioid therapy that is monitored closely by her PCP. She is on MS Contin 100mg  BID and Morphine IR 15 BID PRN for breakthrough which she reports taking twice daily and that in general feels her regimen is suboptimal at pain control but she can tolerate the chronic pain at the current dose.Additionally she has very severe generalized anxiety disorder and panic disorder with a remote history of trauma and PTSD. She is on Alprazolam TID prior to admission.  Patient was concerned that her pain and anxiety would not be controlled during her hospitalization/post-op course and asked for palliative consultation for pain and symptom management. She is having a very difficult time coping with her current condition and has difficult social and financial barriers to effective chronic disease management.  Recommendations:  I spoke with her RN Josh- while Ms. Chao has her regular oral doses of morphine ordered she is on Strict NPO which means meds too and would not be receiving these- her prn dose of IV morphine is only 2mg  q3 hours and would much less and than even her regular home doses.  Acute on Chronic Post-op Pain/Opioid dependence  Equianalgesic Dosing:  MS Contin 100 BID + Morphine IR 15mg  BID= total daily/24 dose =230mg  daily MED  That is roughly equivalent to 3mg  of IV morphine that would need to be given every hour to equal her chronic non acute dosing.  Since I expect her to have increased opioid needs in setting of acute pain and given higher doses of morphine needed will use IV  hydromorphone while she is NPO and see if this provides adequate relief.  The equal analgesic dose of hydromorphone with a 50% reduction for cross tolerance is 0.5/hr so I will schedule 1mg  every 2 hours for her basal pain control. For now will see how she does and if breakthrough dose is needed will provide additional 1mg  q2 prn for inadequate pain control.  Anxiety  Ms. Bolding has been on benzodiazepines for many years and has increasing tolerance   Since she is NPO, I have ordered IV Ativan 1mg  q4 prn for agitation and anxiety and a scheduled dose at bedtime no she does not experience benzo withdrawal or seizure while not taking her regular oral home medications.  I discussed code status at length with patient and her daughter prior to this admission. She would not want to be on life support or have CPR done in her current condition especially if that intervention would lead to severe debility and a poor QOL. At this time she does desire full scope treatment until she can get more answers about her cancer, the prognosis and anticipated illness progression and its impact on her QOL and wellbeing. Before discharge our team will help with advance care planning and if needed further clarify her wishes in the orders.  Additional Concerns:   Patient is worried about how she will speak or eat- I reassured her that SLP would work with her on a plan and do education.  Oncology consult  Our team will see her tomorrow and continue to support Gatha through this  hospitalization.   Lane Hacker, DO Palliative Medicine [phone]

## 2018-05-25 NOTE — Consult Note (Signed)
NAME:  [name], MRN:  [id number], DOB:  13-Feb-1961, LOS: 0 ADMISSION DATE:  05/25/2018, CONSULTATION DATE:  05/25/18 REFERRING MD:  Dr. Janace Hoard, CHIEF COMPLAINT:  Lung Nodules    Brief History   57 y/o F who presented to [facility] on 10/25 with   Past Medical History  GERD, depression, chronic back pain, fibromyalgia, cervical cancer, anxiety, osteoporosis, laryngeal mass, smoker  Significant Hospital Events   10/25  Admit    Consults: date of consult/date signed off & final recs:  10/25 PCCM   Procedures (surgical and bedside):  10/25 Tracheostomy placement per Dr. 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  Emphysema with new bilateral upper lung pulmonary nodules that are suspicious for metastatic disease Cytology 10/25 >>   Micro Data:    Antimicrobials:    Subjective:    Objective   Blood pressure 113/77, pulse (!) 111, temperature 97.8 F (36.6 C), temperature source Axillary, resp. rate 18, height 5\' 7"  (1.702 m), weight 46.1 kg, last menstrual period 09/29/2003, SpO2 97 %.        Intake/Output Summary (Last 24 hours) at 05/25/2018 1503 Last data filed at 05/25/2018 1110 Gross per 24 hour  Intake 600 ml  Output 5 ml  Net 595 ml   Filed Weights   05/25/18 0841  Weight: 46.1 kg    Examination: General:  Chronically ill appearing female, NAD HEENT: [place]/AT, PERRL, EOM-I and MMM Neuro: Alert and interactive, moving all ext to command CV: RRR, Nl S1/S2 and -M/R/G PULM: coarse and decreased BS diffusely GI: Soft, NT, ND and +BS Extremities: -edema and -tenderness Skin: no rashes or lesions  Resolved Hospital Problem list      Assessment & Plan:   Multiple Bilateral Pulmonary Nodules  - concern for metastatic disease  P: Assess CT chest without contrast  Will await laryngeal pathology / ONC input before pursuing pulmonary biopsy   Laryngeal Mass status post Biopsy & Tracheostomy  - tracheostomy per Dr. Janace Hoard  P: Post operative care per ENT Trach care per protocol   Tobacco Abuse  P: Smoking cessation counseling  Continue Dulera, spiriva  Duoneb Q6 PRN   Disposition / Summary of Today's Plan 05/25/18   Continue supportive care.  Await pathology on laryngeal biopsy before pursuing any biopsy of pulmonary nodules.       Diet: NPO Pain/Anxiety/Delirium protocol (if indicated): n/a VAP protocol (if indicated): n/a DVT prophylaxis: heparin SQ GI prophylaxis: n/a Hyperglycemia protocol: n/a Mobility: bed rest  Code Status: Full Code  Family Communication: Patient updated per Dr. Nelda Marseille.  Labs   CBC: Recent Labs  Lab 05/21/18 1154  WBC 7.3  HGB 13.1  HCT 41.1  MCV 98.8  PLT 720    Basic Metabolic Panel: Recent Labs  Lab 05/21/18 1154  NA 138  K 3.7  CL 102  CO2 27  GLUCOSE 97  BUN <5*  CREATININE 0.70  CALCIUM 9.3   GFR: Estimated Creatinine Clearance: 55.1 mL/min (by C-G formula based on SCr of 0.7 mg/dL). Recent Labs  Lab 05/21/18 1154  WBC 7.3    Liver Function Tests: No results for input(s): AST, ALT, ALKPHOS, BILITOT, PROT, ALBUMIN in the last 168 hours.  No results for input(s): LIPASE, AMYLASE in the last 168 hours. No results for input(s): AMMONIA in the last 168 hours.  ABG    Component Value Date/Time   PHART 7.404 05/30/2014 0400   PCO2ART 38.1 05/30/2014 0400   PO2ART 89.1 05/30/2014 0400   HCO3 23.3 05/30/2014 0400   TCO2 24.5 05/30/2014 0400   ACIDBASEDEF 0.8 05/30/2014 0400   O2SAT 96.9 05/30/2014 0400     Coagulation Profile: No results for input(s): INR, PROTIME in the last 168 hours.  Cardiac Enzymes: No results for input(s): CKTOTAL,  CKMB, CKMBINDEX, TROPONINI in the last 168 hours.  HbA1C: No results found for: HGBA1C  CBG: No results for input(s): GLUCAP in the last 168 hours.  Admitting History of Present Illness.   57 y/o F, smoker, who presented to [facility] on 10/25 for planned tracheostomy with biopsy.    The patient was seen 9/21 by primary care with a two week history of cough, swollen lymph glands in her neck, fatigue & sore throat on the left.  She had previously been seen by her PCP and was given Augmentin for similar symptoms but was unable to swallow the pills.  She was given a liquid.  The patient reported she could not take the solution as it upset her stomach.  She was also seen in the ER on 9/20 but left before work up could be completed.  She was ultimately referred to Dr. Janace Hoard on 10/3 for evaluation. She underwent transnasal laryngoscopy in the office which revealed a left supraglottic mass with possible subglottic extension.  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  Emphysema with new bilateral upper lung pulmonary nodules that are suspicious for metastatic disease.  She was planned for tracheostomy with mass biopsy.  Post biopsy on 10/25, PCCM consulted for evaluation of pulmonary nodules.      Review of Systems: Positives in [place]  Gen: Denies fever, chills, weight change, fatigue, night sweats HEENT: Denies blurred vision, double vision, hearing loss, tinnitus, sinus congestion, rhinorrhea, sore throat, neck stiffness, dysphagia, hoarseness pre-op PULM: Denies shortness of breath, cough, sputum production, hemoptysis, wheezing CV: Denies chest pain, edema, orthopnea, paroxysmal nocturnal dyspnea,  palpitations GI: Denies abdominal pain, nausea, vomiting, diarrhea, hematochezia, melena, constipation, change in bowel habits GU: Denies dysuria, hematuria, polyuria, oliguria, urethral discharge Endocrine: Denies hot or cold intolerance, polyuria, polyphagia or appetite change Derm: Denies rash, dry skin, scaling or peeling skin change Heme: Denies easy bruising, bleeding, bleeding gums Neuro: Denies headache, numbness, weakness, slurred speech, loss of memory or consciousness PSY: tearful, anxious   Past Medical History  She,  has a past medical history of Anxiety, Arthritis, Cervical cancer ([place]), Chronic back pain, COPD (chronic obstructive pulmonary disease) ([place]), Depression, Fibromyalgia, GERD (gastroesophageal reflux disease) ( October 2011), Headache(784.0), Hemorrhoids, Laryngeal mass, and Osteoporosis.   Surgical History    Past Surgical History:  Procedure Laterality Date  . ADENOIDECTOMY    . CATARACT EXTRACTION W/ INTRAOCULAR LENS  IMPLANT, BILATERAL    . CERVICAL CONE BIOPSY     leep procedure  . MULTIPLE EXTRACTIONS WITH ALVEOLOPLASTY  06/18/2012   Procedure: MULTIPLE EXTRACION WITH ALVEOLOPLASTY;  Surgeon: Gae Bon, DDS;  Location: [place];  Service: Oral Surgery;  Laterality: Bilateral;  . TONSILLECTOMY       Social History   Social History   Socioeconomic History  . Marital status: Divorced    Spouse name: Not on file  . Number of children: 1  . Years of education: Not on file  . Highest education level: Not on file  Occupational History  . Occupation: disabled  Social Needs  . Financial resource strain: Not on file  . Food insecurity:    Worry: Not on file    Inability: Not on file  . Transportation needs:    Medical: Not on file    Non-medical: Not on file  Tobacco Use  . Smoking status: Current Every Day Smoker    Packs/day: 1.00    Years: 40.00    Pack years: 40.00    Types: Cigarettes  . Smokeless tobacco: Never Used  . Tobacco comment:   TRYING TO QUIT/ HAS CUT BACK  Substance and Sexual Activity  . Alcohol use: No    Alcohol/week: 0.0 standard drinks  . Drug use: No    Comment: Hx: drugs use ; none since 1995  . Sexual activity: Not Currently    Birth control/protection: None  Lifestyle  . Physical activity:    Days per week: Not on file    Minutes per session: Not on file  . Stress: Not on file  Relationships  . Social connections:    Talks on phone: Not on file    Gets together: Not on file    Attends religious service: Not on file    Active member of club or organization: Not on file    Attends meetings of clubs or organizations: Not on file    Relationship status: Not on file  . Intimate partner violence:    Fear of current or ex partner: Not on file    Emotionally abused: Not on file    Physically abused: Not on file    Forced sexual activity: Not on file  Other Topics Concern  . Not on file  Social History Narrative   10th grade education. Married then divorced mid 50's. One daughter. Crack cocaine about 1995 ish. One Mollie [place] admit 2001 for SI. Deemed disabled 04/01/2004 By [facility] due to documented health and mental issues.  ,  reports that she has been smoking cigarettes. She has a 40.00 pack-year smoking history. She has never used smokeless tobacco. She reports that she does not drink alcohol or use drugs.   Family History   Her family history includes Diabetes in her mother; Heart disease in her father; Lung cancer in her brother; Stroke in her mother.   Allergies Allergies  Allergen Reactions  . Aripiprazole Other (See Comments)    Suicidal ideation, also with SSRI  . Chantix [Varenicline] Other (See Comments)    Irritability and depressed mood  . Pregabalin Hives    On trunk of body  . Zolpidem Tartrate Other (See Comments)    UNKNOWN  Pt states she does not know about this allergy      Home Medications  Prior to Admission medications   Medication Sig Start Date End Date Taking?  Authorizing Provider  ALPRAZolam (XANAX) 1 MG tablet TAKE 1 TABLET BY MOUTH 4 TIMES A DAY AS NEEDED FOR ANXIETY Patient taking differently: Take 1 mg by mouth 3 (three) times daily as needed for anxiety. TAKE 1 TABLET BY MOUTH 4 TIMES A DAY AS NEEDED FOR ANXIETY 01/30/18  Yes Pricilla Holm  A, MD  bisacodyl (DULCOLAX) 5 MG EC tablet Take 5 mg by mouth daily as needed for mild constipation or moderate constipation.   Yes [provider]  esomeprazole (NEXIUM) 40 MG capsule TAKE ONE CAPSULE BY MOUTH TWICE A DAY BEFORE A MEAL Patient taking differently: Take 40 mg by mouth 2 (two) times daily before a meal. TAKE ONE CAPSULE BY MOUTH TWICE A DAY BEFORE A MEAL 04/23/18  Yes Hoyt Koch, MD  ipratropium-albuterol (DUONEB) 0.5-2.5 (3) MG/3ML SOLN Take 3 mLs by nebulization every 6 (six) hours as needed. 08/31/17  Yes Mannam, Praveen, MD  lactose free nutrition (BOOST) LIQD Take 237 mLs by mouth daily. Vanilla    Yes [provider]  morphine (MS CONTIN) 100 MG 12 hr tablet Take 1 tablet (100 mg total) by mouth every 12 (twelve) hours. 04/26/18  Yes Marrian Salvage, FNP  morphine (MSIR) 15 MG tablet Take 1 tablet (15 mg total) by mouth 2 (two) times daily as needed for severe pain. 04/26/18  Yes Marrian Salvage, FNP  OXYGEN Inhale 3 L/min into the lungs See admin instructions. Continuous while sleeping and as needed throughout the day during physical activity   Yes [provider]  promethazine (PHENERGAN) 25 MG tablet TAKE 1 TABLET (25 MG TOTAL) BY MOUTH EVERY 8 (EIGHT) HOURS AS NEEDED FOR NAUSEA OR VOMITING. 01/29/18  Yes Hoyt Koch, MD  SYMBICORT 160-4.5 MCG/ACT inhaler TAKE 2 PUFFS BY MOUTH TWICE A DAY Patient taking differently: Inhale 2 puffs into the lungs 2 (two) times daily.  02/16/18  Yes Hoyt Koch, MD  Tiotropium Bromide Monohydrate (SPIRIVA RESPIMAT) 2.5 MCG/ACT AERS Inhale 2 puffs into the lungs daily. 08/31/17  Yes Mannam,  Praveen, MD  amoxicillin-clavulanate (AUGMENTIN) 200-28.5 MG/5ML suspension Take 21.9 mLs (876 mg total) by mouth 2 (two) times daily. Patient not taking: Reported on 05/15/2018 04/17/18   Hoyt Koch, MD  azithromycin (ZITHROMAX) 500 MG tablet 500 QD x 3days. Patient not taking: Reported on 05/15/2018 04/21/18   Howard Pouch A, DO  megestrol (MEGACE ES) 625 MG/5ML suspension TAKE 5 MLS (625 MG TOTAL) BY MOUTH DAILY. Patient taking differently: Take 625 mg by mouth 2 (two) times a week.  04/23/18   Hoyt Koch, MD  Methylnaltrexone Bromide (RELISTOR) 150 MG TABS Take 150 mg by mouth daily. Patient not taking: Reported on 05/15/2018 01/12/18   Hoyt Koch, MD  Respiratory Therapy Supplies (FLUTTER) DEVI USE AS DIRECTED 08/31/17   Marshell Garfinkel, MD  SPIRIVA HANDIHALER 18 MCG inhalation capsule PLACE 1 CAPSULE INTO INHALER AND INHALE DAILY Patient not taking: Reported on 05/15/2018 08/16/17   Hoyt Koch, MD         Noe Gens, NP-C [place] [facility] Pgr: [phone] or if no answer [phone] 05/25/2018, 3:03 PM  Attending Note:  57 year old female smoker who presents with a laryngeal mass s/p biopsy and trach placement due to the location.  On the CT of the neck the upper parts of the lungs were scanned and nodules concerning for mets were noted.  On exam, patient is tearful with a fresh trach and some minimal bleeding noted with distant BS diffusely.  I reviewed chest CT myself and nodules noted.  Discussed with Dr. Hilma Favors, patient will not want any aggressive interventions if there is evidence of lung cancer.    Pulmonary nodules:             - Not amenable to bronch for  now             - Chest CT without contrast  Laryngeal mass:             - F/u on pathology             - Will need patient to be seen by oncology             - If there are evidence of lung cancer then she will not want any treatment under current  circumstances  Trach status:             - Maintain current trach size and type  Hypoxemia:             - Titrate O2 for sat of 88-92%             - TC with humidified O2  Patient will likely need a medical consult, ?if Dr. Hilma Favors will do.  I spoke with her directly and will order the chest CT.  Patient seen and examined, agree with above note.  I dictated the care and orders written for this patient under my direction.  Rush Farmer, M.D. [facility]Critical Care Medicine. Pager: [phone]. After hours pager: [phone].

## 2018-05-25 NOTE — Anesthesia Postprocedure Evaluation (Signed)
Anesthesia Post Note  Patient: [name]  Procedure(s) Performed: DIRECT LARYNGOSCOPY (Bilateral Mouth) TRACHEOSTOMY (Bilateral Neck)     Patient location during evaluation: PACU Anesthesia Type: General Level of consciousness: awake and alert Pain management: pain level controlled Vital Signs Assessment: post-procedure vital signs reviewed and stable Respiratory status: spontaneous breathing, nonlabored ventilation, respiratory function stable and patient connected to nasal cannula oxygen Cardiovascular status: blood pressure returned to baseline and stable Postop Assessment: no apparent nausea or vomiting Anesthetic complications: no    Last Vitals:  Vitals:   05/25/18 1207 05/25/18 1222  BP: 96/66 92/62  Pulse: (!) 101 99  Resp: 17 18  Temp:    SpO2: 97% 98%    Last Pain:  Vitals:   05/25/18 1229  PainSc: 7                  Jeric Slagel S

## 2018-05-26 ENCOUNTER — Inpatient Hospital Stay (HOSPITAL_COMMUNITY): Payer: Medicare Other

## 2018-05-26 ENCOUNTER — Encounter (HOSPITAL_COMMUNITY): Payer: Self-pay | Admitting: Otolaryngology

## 2018-05-26 DIAGNOSIS — J449 Chronic obstructive pulmonary disease, unspecified: Secondary | ICD-10-CM

## 2018-05-26 LAB — GLUCOSE, CAPILLARY: GLUCOSE-CAPILLARY: 111 mg/dL — AB (ref 70–99)

## 2018-05-26 LAB — HIV ANTIBODY (ROUTINE TESTING W REFLEX): HIV Screen 4th Generation wRfx: NONREACTIVE

## 2018-05-26 MED ORDER — ORAL CARE MOUTH RINSE
15.0000 mL | Freq: Two times a day (BID) | OROMUCOSAL | Status: DC
  Administered 2018-05-26 – 2018-05-30 (×7): 15 mL via OROMUCOSAL

## 2018-05-26 MED ORDER — DEXTROSE IN LACTATED RINGERS 5 % IV SOLN
INTRAVENOUS | Status: AC
  Administered 2018-05-26: 21:00:00 via INTRAVENOUS

## 2018-05-26 MED ORDER — CHLORHEXIDINE GLUCONATE 0.12 % MT SOLN
15.0000 mL | Freq: Two times a day (BID) | OROMUCOSAL | Status: DC
  Administered 2018-05-26 – 2018-05-31 (×10): 15 mL via OROMUCOSAL
  Filled 2018-05-26 (×9): qty 15

## 2018-05-26 MED ORDER — HYDROMORPHONE HCL 1 MG/ML IJ SOLN
1.0000 mg | INTRAMUSCULAR | Status: DC | PRN
  Administered 2018-05-27: 1 mg via INTRAVENOUS
  Filled 2018-05-26: qty 1

## 2018-05-26 NOTE — Progress Notes (Signed)
Received a phonecall from [facility] radiology regarding CT scan results asking for ENT on call number.  Numbers to the practice and the ENT on call number were provided.  Radiologist called back and stated that he did not receive an answer from any of those numbers.  She reviewed the CT scan with me and asked for any other providers that had consulted on this pt.  AC and charge nurse were notified.  This RN tried to call provider on call and number went straight to voicemail multiple times.    Pulmonary provider in to see pt regarding scan results.

## 2018-05-26 NOTE — Progress Notes (Addendum)
Palliative Care Progress Note  Pain seems to be under reasonable control although worse in the evenings when her anxiety tends to be higher and she has PRN ativan for any agitation and will add on a PRN breakthrough dose of hydromorphone q4 PRN.  I spoke with her daughter by phone. She has the following questions:  1. When will the "trach" people come talk to them and do education?  I place and SLP consult so they can evaluate her for PMV and evaluate her for swallowing - patient and her daughter will need education.  Will need to get her set-up with oncology pending tissue biopsy results for confirmation. Their Head and Neck Cancer RN Navigator nurse navigator can help with continued trach education and help with care coordination in post acute setting. 2. Why is her mother still not eating? Still not able to take pills? How is she getting nutrition? Concerned about her current nutritional status and significant weight loss over the past few months.  Should obtain a nutrition consult once she is cleared.  Will give her a L of D5 LR to prevent hypoglycemic  May need protein supplementation  4. Daughter is requesting results of the Lung CT scan- will defer to pulmonary and I appreciate their assistance in evaluating the lung nodules,managing her severe COPD and monitoring the possible post operative pneumomediastinum and suspicious area on left upper lobe for early PNA vs.pnuemonitis.  Goals of Care- discussed in detail:  1. To continue on current path and treatment plan - awaiting pathology results 2. To have adequate pain and anxiety control  3. To regain her ability to eat safely 4. To regain a voice with a speaking valve 5. To know what to expect when going home 6. DNR but not do not treat- code status in the event of cardiac arrest-treat the treatable unless otherwise stated or discussed.  Recommendations:  1. Added PRN breakthrough dose of hydromorphone- once taking PO will  transition back to her home dose with higher and more frequent doses for breakthrough pain.  2. SLP consult for PMV/Trach education/Swallowing evaluation  3. Pending path she needs to be setup with head and Neck cancer team- per primary service (ENT)  4. D5 LR X1, Monitor her IO and volume status. Start PO as soon as possible.  5. DNR order placed and confirmed.  6. Goal is to get her home with a safe, reasonable post-acute follow-up plan as soon as possible.  7. We need to make sure she is mobilizing and getting OOB. Consider PT consult for eval and tx.   Palliative will continue to follow.  Lane Hacker, DO Palliative Medicine [phone]

## 2018-05-26 NOTE — Progress Notes (Signed)
Patient ID: [name], female   DOB: 07/30/1961, 57 y.o.   MRN: [id number] Subjective: She did well overnight, no difficulty with breathing or with tracheostomy.  The radiologic findings were reviewed.  Objective: Vital signs in last 24 hours: Temp:  [97.5 F (36.4 C)-99.4 F (37.4 C)] 97.5 F (36.4 C) (10/26 0826) Pulse Rate:  [70-111] 94 (10/26 0835) Resp:  [15-34] 16 (10/26 0835) BP: (89-151)/(61-89) 151/89 (10/26 0826) SpO2:  [95 %-100 %] 98 % (10/26 0835) FiO2 (%):  [28 %] 28 % (10/26 0835) Weight:  [46.1 kg] 46.1 kg (10/25 0841) Weight change:     Intake/Output from previous day: 10/25 0701 - 10/26 0700 In: 1821.3 [I.V.:1821.3] Out: 855 [Urine:850; Blood:5] Intake/Output this shift: No intake/output data recorded.  PHYSICAL EXAM: She is awake and alert.  Tracheostomy is well seated.  There is no palpable subcutaneous emphysema.  She is moving air well without any distress.  Lab Results: No results for input(s): WBC, HGB, HCT, PLT in the last 72 hours. BMET Recent Labs    05/25/18 1610  CREATININE 0.65    Studies/Results: Ct Chest Wo Contrast  Result Date: 05/25/2018 CLINICAL DATA:  Lung nodule EXAM: CT CHEST WITHOUT CONTRAST TECHNIQUE: Multidetector CT imaging of the chest was performed following the standard protocol without IV contrast. COMPARISON:  Chest x-ray 12/15/2017, CT chest 09/19/2017, 03/09/2015 FINDINGS: Cardiovascular: Limited evaluation without intravenous contrast. Moderate aortic atherosclerosis. No aneurysmal dilatation. Coronary vascular calcification. Normal heart size. No pericardial effusion Mediastinum/Nodes: Tracheostomy tube tip about 2.8 cm superior to the carina. Fluid within the trachea above the tracheostomy tube. Moderate to large amount of pneumomediastinum with moderate soft tissue emphysema dissecting to the neck and supraclavicular fossa. Esophagus within normal limits. Lungs/Pleura: Emphysema. Tiny anterior pneumothoraces. No  pleural effusion. Tiny pulmonary nodules measuring up to 3 mm, most visible in the superior segment of left lower lobe. Mild ground-glass density within the posterior left upper lobe and superior segment of left lower lobe. Upper Abdomen: Calcified granuloma. Tiny amount of air anterior to the liver, likely dissecting down from pneumomediastinum. Musculoskeletal: No acute or suspicious osseous abnormality. IMPRESSION: 1. Tracheostomy tube is in place. Moderate to large amount of pneumomediastinum with soft tissue emphysema also present in the neck and supraclavicular fossa and dissecting down into the upper abdomen. Uncertain if findings are related to recent tracheostomy tube placement versus presence of small air leak. There are tiny anterior pneumothoraces. 2. Emphysema. Development of tiny pulmonary nodules, most evident in the superior segment of left lower lobe. Not certain if these are inflammatory, infectious or metastatic. Small foci of ground-glass density in the left upper lobe and superior segment of left lower lobe could be due to pneumonitis or mild foci of infection. Critical Value/emergent results were called by telephone at the time of interpretation on 05/25/2018 at 11:59 pm to Dr. Elsworth Soho , who verbally acknowledged these results. Aortic Atherosclerosis (ICD10-I70.0) and Emphysema (ICD10-J43.9). Electronically Signed   By: Donavan Foil M.D.   On: 05/25/2018 23:59    Medications: I have reviewed the patient's current medications.  Assessment/Plan: Stable post tracheostomy.  Small bilateral pneumothoraces present on imaging last night.  X-ray this morning reviewed by me, no evidence of significant pneumothorax or mediastinal air.  Official report pending.  Continue to monitor.  LOS: 1 day   [name] 05/26/2018, 8:37 AM

## 2018-05-26 NOTE — Progress Notes (Signed)
eLink Physician-Brief Progress Note Patient Name: [name] DOB: 09/10/1960 MRN: [id number]   Date of Service  05/26/2018  HPI/Events of Note  Received call report from radiology >> small pneumomediastinum + BL pneumothoraces  eICU Interventions  Reviewed history >> likely related to recent tstomy & air dissecting through - will ask bedside team to assess     Intervention Category Intermediate Interventions: Diagnostic test evaluation  Ilynn Stauffer V. Lowell Mcgurk 05/26/2018, 12:01 AM

## 2018-05-26 NOTE — Progress Notes (Signed)
NAME:  [name], MRN:  [id number], DOB:  Sep 27, 1960, LOS: 1 ADMISSION DATE:  05/25/2018, CONSULTATION DATE:  05/25/18 REFERRING MD:  Dr. Janace Hoard, CHIEF COMPLAINT:  Lung Nodules    Brief History   57 y/o F who presented to [facility] on 10/25 with   Past Medical History  GERD, depression, chronic back pain, fibromyalgia, cervical cancer, anxiety, osteoporosis, laryngeal mass, smoker  Significant Hospital Events   10/25  Admit    Consults: date of consult/date signed off & final recs:  10/25 PCCM   Procedures (surgical and bedside):  10/25 Tracheostomy placement per Dr. 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  Emphysema with new bilateral upper lung pulmonary nodules that are suspicious for metastatic disease Cytology 10/25 >>   Micro Data:    Antimicrobials:    Subjective:   Denies SOB was sleeping comfortably when I arrived.  Objective   Blood pressure 94/61, pulse 77, temperature 98.7 F (37.1 C), temperature source Oral, resp. rate 18, height 5\' 7"  (1.702 m), weight 46.1 kg, last menstrual period 09/29/2003, SpO2 97 %.    FiO2 (%):  [28 %] 28 %   Intake/Output Summary (Last 24 hours) at 05/26/2018 0014 Last data filed at 05/25/2018 1504 Gross per 24 hour  Intake 600 ml  Output 655 ml  Net -55 ml   Filed Weights   05/25/18 0841  Weight: 46.1 kg    Examination: General:chronically ill appearing 57 year old female no distress HEENT: NCAT no JVD trach unremarkable  Neuro:awake alert no focal def CV:RRR PULM: coarse scattered rhonchi. No [place] air palp  GI: soft not tender  Extremities: no edema or tenderness Skin: warm and dry   Resolved  Hospital Problem list     Assessment & Plan:   Pneumomediastinum and small bilateral PTX -suspect 2/2 recent trach.  -no shortness of breath -thinks she has some discomfort w/ deep breath  -CT chest personally reviewed. + [place] and mediastinal air  Plan Cont pulse ox Cont supplemental oxygen  F/u am cxr If gets bigger then will need to involve thoracic surg but anticipate this will resolve on own    Disposition / Summary of Today's Plan 05/26/18   F/u am cxr     Diet: NPO Pain/Anxiety/Delirium protocol (if indicated): n/a VAP protocol (if indicated): n/a DVT prophylaxis: heparin SQ GI prophylaxis: n/a Hyperglycemia protocol: n/a Mobility: bed rest  Code Status: Full Code  Family Communication: Patient updated per Dr. Nelda Marseille.  Labs   CBC: Recent Labs  Lab 05/21/18 1154  WBC 7.3  HGB 13.1  HCT 41.1  MCV 98.8  PLT 062    Basic Metabolic Panel: Recent Labs  Lab 05/21/18 1154 05/25/18 1610  NA 138  --   K 3.7  --   CL 102  --   CO2 27  --   GLUCOSE 97  --   BUN <5*  --   CREATININE 0.70 0.65  CALCIUM 9.3  --    GFR: Estimated Creatinine Clearance: 55.1 mL/min (by C-G formula based on SCr of 0.65 mg/dL). Recent Labs  Lab 05/21/18 1154  WBC 7.3    Liver Function Tests: No results for input(s): AST, ALT, ALKPHOS,  BILITOT, PROT, ALBUMIN in the last 168 hours. No results for input(s): LIPASE, AMYLASE in the last 168 hours. No results for input(s): AMMONIA in the last 168 hours.  ABG    Component Value Date/Time   PHART 7.404 05/30/2014 0400   PCO2ART 38.1 05/30/2014 0400   PO2ART 89.1 05/30/2014 0400   HCO3 23.3 05/30/2014 0400   TCO2 24.5 05/30/2014 0400   ACIDBASEDEF 0.8 05/30/2014 0400   O2SAT 96.9 05/30/2014 0400     Coagulation Profile: No results for input(s): INR, PROTIME in the last 168 hours.  Cardiac Enzymes: No results for input(s): CKTOTAL, CKMB, CKMBINDEX, TROPONINI in the last 168 hours.  HbA1C: No results found for:  HGBA1C  CBG: No results for input(s): GLUCAP in the last 168 hours.  Erick Colace ACNP-BC [place] Pager # [phone] OR # [phone] if no answer

## 2018-05-26 NOTE — Progress Notes (Signed)
NAME:  [name], MRN:  [id number], DOB:  [birth year], LOS: 1 ADMISSION DATE:  05/25/2018, CONSULTATION DATE:  05/25/18 REFERRING MD:  Dr. Janace Hoard, CHIEF COMPLAINT:  Lung Nodules    Brief History   57 y/o F who presented to [facility] on 10/25 for bx of supraglottic laryngeal mass and tracheostomy with ptx post op.  Past Medical History  GERD, depression, chronic back pain, fibromyalgia, cervical cancer, anxiety, osteoporosis, laryngeal mass, smoker  Significant Hospital Events   10/25  Admit    Consults: date of consult/date signed off & final recs:  10/25 PCCM   Procedures (surgical and bedside):  10/25 Tracheostomy placement per Dr. 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  Emphysema with new bilateral upper lung pulmonary nodules that are suspicious for metastatic disease Cytology 10/25 >>   Micro Data:    Antimicrobials:    Subjective:  Miserable with trach but denies sob/ cp/ bloody secretions    Objective   Blood pressure 106/71, pulse 84, temperature 97.7 F (36.5 C), temperature source Axillary, resp. rate (!) 41, height 5\' 7"  (1.702 m), weight 46.1 kg, last menstrual period 09/29/2003, SpO2 97 %.    FiO2 (%):  [28 %] 28 %   Intake/Output Summary (Last 24 hours) at 05/26/2018 1708 Last data filed at 05/26/2018 1206 Gross per 24 hour  Intake 1936.33 ml  Output 200 ml  Net 1736.33 ml   Filed Weights   05/25/18 0841  Weight: 46.1 kg    Examination: General chronically ill elderly wf nad  Wt Readings from Last 3 Encounters:  05/25/18 46.1 kg  05/21/18 46.1 kg  04/21/18 48.1 kg     Pt alert, approp nad @ 45 degrees hob No jvd/ no  obvious crepitance Oropharynx clear,  mucosa nl Neck supple/ trach looks good  Lungs with a few scattered exp > insp rhonchi bilaterally RRR no s3 or or sign murmur Abd obese with nl  excursion  Extr warm with no edema or clubbing noted Neuro  Sensorium intact,  no apparent motor deficits     I personally reviewed images and agree with radiology impression as follows:  CXR:  10/26 1. Evidence of pneumomediastinum and bilateral supraclavicular subcutaneous gas appears stable since the CT yesterday. 2. No new cardiopulmonary abnormality.  Resolved Hospital Problem list     Assessment & Plan:   Pneumomediastinum and small bilateral PTX  - no change on cxr  Plan Cont supplemental 02 per trach, will help with reabsorption of nitrogen from leaked air      Disposition / Summary of Today's Plan 05/26/18   F/u am cxr  10/27        Labs   CBC: Recent Labs  Lab 05/21/18 1154  WBC 7.3  HGB 13.1  HCT 41.1  MCV 98.8  PLT 409    Basic Metabolic Panel: Recent Labs  Lab 05/21/18 1154 05/25/18 1610  NA 138  --   K 3.7  --   CL 102  --   CO2 27  --   GLUCOSE 97  --   BUN <5*  --   CREATININE 0.70 0.65  CALCIUM 9.3  --    GFR: Estimated Creatinine  Clearance: 55.1 mL/min (by C-G formula based on SCr of 0.65 mg/dL). Recent Labs  Lab 05/21/18 1154  WBC 7.3    Liver Function Tests: No results for input(s): AST, ALT, ALKPHOS, BILITOT, PROT, ALBUMIN in the last 168 hours. No results for input(s): LIPASE, AMYLASE in the last 168 hours. No results for input(s): AMMONIA in the last 168 hours.  ABG    Component Value Date/Time   PHART 7.404 05/30/2014 0400   PCO2ART 38.1 05/30/2014 0400   PO2ART 89.1 05/30/2014 0400   HCO3 23.3 05/30/2014 0400   TCO2 24.5 05/30/2014 0400   ACIDBASEDEF 0.8 05/30/2014 0400   O2SAT 96.9 05/30/2014 0400     Coagulation Profile: No results for input(s): INR, PROTIME in the last 168 hours.  Cardiac Enzymes: No results for  input(s): CKTOTAL, CKMB, CKMBINDEX, TROPONINI in the last 168 hours.  HbA1C: No results found for: HGBA1C  CBG: No results for input(s): GLUCAP in the last 168 hours.

## 2018-05-27 ENCOUNTER — Other Ambulatory Visit: Payer: Self-pay

## 2018-05-27 ENCOUNTER — Inpatient Hospital Stay (HOSPITAL_COMMUNITY): Payer: Medicare Other

## 2018-05-27 LAB — MRSA PCR SCREENING: MRSA by PCR: NEGATIVE

## 2018-05-27 MED ORDER — SODIUM CHLORIDE 0.9% FLUSH: 9.0000 mL | INTRAVENOUS | Status: DC | PRN

## 2018-05-27 MED ORDER — VANCOMYCIN HCL IN DEXTROSE 1-5 GM/200ML-% IV SOLN
1000.0000 mg | INTRAVENOUS | Status: DC
  Administered 2018-05-28 – 2018-05-29 (×3): 1000 mg via INTRAVENOUS
  Filled 2018-05-27 (×3): qty 200

## 2018-05-27 MED ORDER — SODIUM CHLORIDE 0.9 % IV SOLN
1.0000 g | Freq: Three times a day (TID) | INTRAVENOUS | Status: DC
  Administered 2018-05-28 – 2018-05-30 (×8): 1 g via INTRAVENOUS
  Filled 2018-05-27 (×10): qty 1

## 2018-05-27 MED ORDER — DIPHENHYDRAMINE HCL 12.5 MG/5ML PO ELIX: 12.5000 mg | ORAL_SOLUTION | Freq: Four times a day (QID) | ORAL | Status: DC | PRN

## 2018-05-27 MED ORDER — HYDROMORPHONE 1 MG/ML IV SOLN
INTRAVENOUS | Status: DC
  Administered 2018-05-27: 2.17 mg via INTRAVENOUS
  Administered 2018-05-27 – 2018-05-28 (×2): 25 mg via INTRAVENOUS
  Administered 2018-05-28: 2.9 mg via INTRAVENOUS
  Administered 2018-05-28: 1 mg via INTRAVENOUS
  Administered 2018-05-28: 25 mg via INTRAVENOUS
  Filled 2018-05-27 (×3): qty 25

## 2018-05-27 MED ORDER — NALOXONE HCL 0.4 MG/ML IJ SOLN: 0.4000 mg | INTRAMUSCULAR | Status: DC | PRN

## 2018-05-27 MED ORDER — FAMOTIDINE IN NACL 20-0.9 MG/50ML-% IV SOLN
20.0000 mg | Freq: Every day | INTRAVENOUS | Status: DC
  Administered 2018-05-28 – 2018-05-29 (×3): 20 mg via INTRAVENOUS
  Filled 2018-05-27 (×4): qty 50

## 2018-05-27 MED ORDER — ACETAMINOPHEN 10 MG/ML IV SOLN
1000.0000 mg | Freq: Four times a day (QID) | INTRAVENOUS | Status: AC | PRN
  Filled 2018-05-27: qty 2400

## 2018-05-27 MED ORDER — DIPHENHYDRAMINE HCL 50 MG/ML IJ SOLN: 12.5000 mg | Freq: Four times a day (QID) | INTRAMUSCULAR | Status: DC | PRN

## 2018-05-27 MED ORDER — ACETAMINOPHEN 650 MG RE SUPP
650.0000 mg | Freq: Three times a day (TID) | RECTAL | Status: DC | PRN
  Administered 2018-05-27: 650 mg via RECTAL
  Filled 2018-05-27: qty 1

## 2018-05-27 NOTE — Progress Notes (Signed)
Called ENT Dr. Elie Goody and informed him of possible sepsis/infection for the patient with being febrile, tachypnea and tachycardic.  Informed him palliative deferring to them for start of IV antimitotics and sputum sample orders.  RT inflating cuff per MD orders and maybe able to tolerate since patient hasn't had a speech consult performed for the [place] valve or food at this time.  Patient maybe able to tolerate per RT and is on a PCU unit not in ICU as MD made aware.

## 2018-05-27 NOTE — Progress Notes (Signed)
Working with RT to get EtCo2 hooked up to trach for PCA readings, the patients respirations went up to over 40's;Marland Kitchen  It was unable to read correctly with her excess secretions even after suctioning.  Patient received bedtime anti-anxiety medication and will retry later.  Currently reading EtCo2 every 4 hours which was approved by the palliative MD if it didn't work.

## 2018-05-27 NOTE — Plan of Care (Signed)
Discussed with patient and daughter over the phone plan of care this evening, new pain medication scheduled routine and anxiety medication scheduled by Dr. Hilma Favors with some teach back displayed.  Family and patient very upset with ENT doctor and doesn't want them back on her case.  Informed PCCM and Financial risk analyst.

## 2018-05-27 NOTE — Progress Notes (Signed)
Pharmacy Antibiotic Note  [name] is a 57 y.o. female admitted on 05/25/2018 with tumor of the larnyx, s/p trach.  Pharmacy has been consulted for Vancomycin/Cefepime dosing. WBC WNL. Renal function good. Pt is febrile up to 102.2.   Plan: Vancomycin 1000 mg IV q24h Cefepime 1g IV q8h Trend WBC, temp, renal function  F/U infectious work-up Drug levels as indicated   Height: 5\' 7"  (170.2 cm) Weight: 101 lb 9.6 oz (46.1 kg) IBW/kg (Calculated) : 61.6  Temp (24hrs), Avg:99.8 F (37.7 C), Min:97.9 F (36.6 C), Max:102.2 F (39 C)  Recent Labs  Lab 05/21/18 1154 05/25/18 1610  WBC 7.3  --   CREATININE 0.70 0.65    Estimated Creatinine Clearance: 55.1 mL/min (by C-G formula based on SCr of 0.65 mg/dL).    Allergies  Allergen Reactions  . Aripiprazole Other (See Comments)    Suicidal ideation, also with SSRI  . Chantix [Varenicline] Other (See Comments)    Irritability and depressed mood  . Pregabalin Hives    On trunk of body  . Zolpidem Tartrate Other (See Comments)    UNKNOWN  Pt states she does not know about this allergy      [name] 05/27/2018 11:57 PM

## 2018-05-27 NOTE — Progress Notes (Signed)
Patient ID: [name], female   DOB: October 02, 1960, 57 y.o.   MRN: [id number] Subjective: Complaining of a lot of secretions otherwise doing well.  Objective: Vital signs in last 24 hours: Temp:  [97.7 F (36.5 C)-98.9 F (37.2 C)] 97.9 F (36.6 C) (10/27 0910) Pulse Rate:  [75-119] 112 (10/27 0910) Resp:  [12-41] 24 (10/27 0910) BP: (106-157)/(71-97) 141/97 (10/27 0910) SpO2:  [95 %-100 %] 97 % (10/27 0910) FiO2 (%):  [28 %] 28 % (10/27 0900) Weight change:  Last BM Date: 05/22/18  Intake/Output from previous day: 10/26 0701 - 10/27 0700 In: 2002.7 [I.V.:2002.7] Out: 200 [Urine:200] Intake/Output this shift: Total I/O In: 596 [I.V.:596] Out: 300 [Urine:300]  PHYSICAL EXAM: Tracheostomy well-seated, she has significant mucousy secretions.  She is breathing well.  There is no swelling or subcutaneous emphysema.  Lab Results: No results for input(s): WBC, HGB, HCT, PLT in the last 72 hours. BMET Recent Labs    05/25/18 1610  CREATININE 0.65    Studies/Results: Ct Chest Wo Contrast  Result Date: 05/25/2018 CLINICAL DATA:  Lung nodule EXAM: CT CHEST WITHOUT CONTRAST TECHNIQUE: Multidetector CT imaging of the chest was performed following the standard protocol without IV contrast. COMPARISON:  Chest x-ray 12/15/2017, CT chest 09/19/2017, 03/09/2015 FINDINGS: Cardiovascular: Limited evaluation without intravenous contrast. Moderate aortic atherosclerosis. No aneurysmal dilatation. Coronary vascular calcification. Normal heart size. No pericardial effusion Mediastinum/Nodes: Tracheostomy tube tip about 2.8 cm superior to the carina. Fluid within the trachea above the tracheostomy tube. Moderate to large amount of pneumomediastinum with moderate soft tissue emphysema dissecting to the neck and supraclavicular fossa. Esophagus within normal limits. Lungs/Pleura: Emphysema. Tiny anterior pneumothoraces. No pleural effusion. Tiny pulmonary nodules measuring up to 3 mm, most visible  in the superior segment of left lower lobe. Mild ground-glass density within the posterior left upper lobe and superior segment of left lower lobe. Upper Abdomen: Calcified granuloma. Tiny amount of air anterior to the liver, likely dissecting down from pneumomediastinum. Musculoskeletal: No acute or suspicious osseous abnormality. IMPRESSION: 1. Tracheostomy tube is in place. Moderate to large amount of pneumomediastinum with soft tissue emphysema also present in the neck and supraclavicular fossa and dissecting down into the upper abdomen. Uncertain if findings are related to recent tracheostomy tube placement versus presence of small air leak. There are tiny anterior pneumothoraces. 2. Emphysema. Development of tiny pulmonary nodules, most evident in the superior segment of left lower lobe. Not certain if these are inflammatory, infectious or metastatic. Small foci of ground-glass density in the left upper lobe and superior segment of left lower lobe could be due to pneumonitis or mild foci of infection. Critical Value/emergent results were called by telephone at the time of interpretation on 05/25/2018 at 11:59 pm to Dr. Elsworth Kaufman , who verbally acknowledged these results. Aortic Atherosclerosis (ICD10-I70.0) and Emphysema (ICD10-J43.9). Electronically Signed   By: [name] M.D.   On: 05/25/2018 23:59   Dg Chest Port 1 View  Result Date: 05/26/2018 CLINICAL DATA:  57 year old female status post tracheostomy yesterday with pneumomediastinum. EXAM: PORTABLE CHEST 1 VIEW COMPARISON:  Chest CT 05/25/2018 and earlier. FINDINGS: Portable AP upright view at 0730 hours. Small volume pneumomediastinum and bilateral supraclavicular subcutaneous gas appears stable from the CT yesterday. Tracheostomy tube appears stable. No pneumothorax, pulmonary edema, pleural effusion or confluent pulmonary opacity. Stable cardiac size and mediastinal contours. Negative visible bowel gas pattern. IMPRESSION: 1. Evidence of  pneumomediastinum and bilateral supraclavicular subcutaneous gas appears stable since the CT yesterday. 2. No new cardiopulmonary  abnormality. Electronically Signed   By: [name] M.D.   On: 05/26/2018 09:38    Medications: I have reviewed the patient's current medications.  Assessment/Plan: Stable tracheostomy.  Continue care.  LOS: 2 days   [name] 05/27/2018, 10:14 AM

## 2018-05-27 NOTE — Plan of Care (Signed)
Discussed plan of care with patient, pain management and new PCA with some teach back displaed

## 2018-05-27 NOTE — Progress Notes (Signed)
Palliative Care Progress Note  Medically complex patient with severe COPD, malnutrition, probable new dx throat cancer (path pending). Chronic pain and anxiety now POD #2 from neck mass biopsy and excision with prophylactic tracheostomy.  Patient now with fever 102, change in volume and color of tracheal secretions with increased respirations, worsening tachycardia. Stable but not progressing over the weekend- CXR from this AM mostly unchanged. Subcutaneous air and ptx unchanged and stable per cxr. Dr. Domingo Cocking saw patient earlier today and we started a PCA since she remains strict NPO.   Recommendations:  1. Repeat CXR 2. Ordered CMET, CBC to check lytes and WBC 3. Since she is still NPO started Famotidine daily for SUP 4. Tylenol IV PRN for fever, NPO, cannot tolerate suppository 5. Given her high risk with structural lung disease, <5 days s/p trach placement and surgery I am concerned she is developing HCAP- Will request pharmacy dose empiric vancomycin and cefepime.  Will continue to follow for palliative (and inpatient medical management in this situation).  Lane Hacker, DO Palliative Medicine

## 2018-05-27 NOTE — Progress Notes (Signed)
Cuff inflated per Dr. Constance Holster verbal order.  Cuff pressure now at 26cmH02.  Pt tolerating well. Will continue to monitor.

## 2018-05-27 NOTE — Progress Notes (Signed)
RT Note: Patient has had an increase in the amounts of airway secretions she has. RT and RN have been keeping up with her suctioning to keep her airway clear but there is a increase in the amounts of secretions this afternoon compared to this morning. Her RN is at bedside presently and is aware of the increase and is helping to suction her. RT will continue to monitor and assist as needed.

## 2018-05-27 NOTE — Progress Notes (Signed)
Sputum culture collected and sent to lab

## 2018-05-27 NOTE — Progress Notes (Signed)
Palliative Care progress note  Reason for consult: Pain management  Discussed case with Dr. Hilma Favors, who is familiar with patient.  I saw and examined [name] this evening.  She is lying in bed, reports pain remains poorly controlled.  She has been receiving dilaudid every 2 hours with additional dose every 4 hours as needed.  We reviewed options for pain management, and I recommended initiation of PCA while she is NPO.  She is agreeable to this.  Discussed with primary who is in agreement as well.  She has been getting dilaudid 1mg  every 2 hours.    - Plan for dilaudid PCA: Basal rate: 0.5mg /hr Bolus: 0.2mg  with 15 minute lockout.  - Tylenol as adjuvant if needed as well.  Total time: 50 minutes Greater than 50%  of this time was spent counseling and coordinating care related to the above assessment and plan.  Micheline Rough, MD [place] [facility] [phone]

## 2018-05-27 NOTE — Evaluation (Signed)
[facility] Speaking Valve - Evaluation Patient Details  Name: [name] MRN: [id number] Date of Birth: Nov 10, 1960  Today's Date: 05/27/2018 Time: 0850-0920 SLP Time Calculation (min) (ACUTE ONLY): 30 min  Past Medical History:  Past Medical History:  Diagnosis Date  . Anxiety   . Arthritis   . Cervical cancer ([place])    Discovered by pap smear, treated by Dr. Charlesetta Garibaldi with LEEP procedure.  Pap since that time has been normal.  . Chronic back pain     her CT of the head without contrast June 28, 2008- moderate to large right paracentral disc protrusion at C4-C5 resulting in a right C5 neuroforaminal stenosis and probably some degree of spinal stenosis. C. bilateral C6 neuroforaminal stenosis related to  degenerative hypertrophy, no acute fracture or loose pieces identifying the cervical spine, ligamentous injury is not excluded.    Marland Kitchen COPD (chronic obstructive pulmonary disease) ([place])   . Depression   . Fibromyalgia   . GERD (gastroesophageal reflux disease)  October 2011    [place], esophagogram - tiny amount of gastroesophageal reflux otherwise unremarkable exam, done by Dr. Dellis Filbert  . Headache(784.0)   . Hemorrhoids   . Laryngeal mass   . Osteoporosis    T score L femur neck = -2.6 (02/21/12)   Past Surgical History:  Past Surgical History:  Procedure Laterality Date  . ADENOIDECTOMY    . CATARACT EXTRACTION W/ INTRAOCULAR LENS  IMPLANT, BILATERAL    . CERVICAL CONE BIOPSY     leep procedure  . DIRECT LARYNGOSCOPY Bilateral 05/25/2018   Procedure: DIRECT LARYNGOSCOPY;  Surgeon: Melissa Montane, MD;  Location: [facility];  Service: ENT;  Laterality: Bilateral;  with BX  . MULTIPLE EXTRACTIONS WITH ALVEOLOPLASTY  06/18/2012   Procedure: MULTIPLE EXTRACION WITH ALVEOLOPLASTY;  Surgeon: Gae Bon, DDS;  Location: [facility];  Service: Oral Surgery;  Laterality: Bilateral;  . TONSILLECTOMY    . TRACHEOSTOMY TUBE PLACEMENT Bilateral 05/25/2018   Procedure: TRACHEOSTOMY;  Surgeon:  Melissa Montane, MD;  Location: [facility];  Service: ENT;  Laterality: Bilateral;   HPI:  Patient orginally evaluated by Dr. Janace Hoard in office where a transnasal layrngoscopy was completed revealing a left supraglottic mass with possible subglottic extension (symptoms of cough, swollen lymph glands in her neck, fatigue & sore throat on the left). Patient admitted on 05/25/2018 for tracheostomy placement and laryngeal tumor biopsies. The tumor was very exophytic and pieces of tumor were laying in her pharynx. The glottis was covered by the tumor but it was not adherent. It was apparently attached to the arytenoid area epiglottic fold area. It extended to the posterior aspect the arytenoid but not into the cricopharyngeus region. CT also revealed lung nodules to be biopsied after pathology received from laryngeal tumors. Patient was refered to speech therapy for PMV and swallowing evaluations.    Assessment / Plan / Recommendation Clinical Impression  Patient recieved tracheostomy 2 days prior and is able to communicate by writing. She is on trach collar at 28%. Nurse communicated she has had a great deal of pain and aniety and was having a bad day today. When entering room patient was awake with eyes closed and communicated with a head nod. She was cooperative for evaluation. She flinched each time her trach area was touched for cleaning and PMV placement. Pt has a moderate amount of thick secretions this morning evidenced by secretions on trach padding (when entering room) and secretions coughed out of trach and up to oral cavity during cuff deflation. Her  cough is strong and adequate to clear airway. Patient was aphonic during finger occlusion and when PMV was placed. She expressed, by mouthing, she is unable to breath during PMV trial. Patient's SpO2 remained at 98% during evaluation, HR increased from 80's-90's. Suspect she had reduced tolerance to PMV due to the level of secretions and possible edema. Recommend PMV  trials with SLP only and consider tracheostomy downsize when possible to aid in PMV tolerance. Patient was experiecing nausea after PMV evaluation. Bedside Swallow Evaluation will be held until later date.  SLP Visit Diagnosis: Aphonia (R49.1)    SLP Assessment  Patient needs continued Speech Lanaguage Pathology Services    Follow Up Recommendations       Frequency and Duration min 2x/week  2 weeks    PMSV Trial PMSV was placed for: 30 sec Able to redirect subglottic air through upper airway: No Able to Attain Phonation: No Voice Quality: Aphonic Able to Expectorate Secretions: Yes Level of Secretion Expectoration with PMSV: Tracheal;Oral Breath Support for Phonation: Adequate(but aphonic, suspect size of tube and/or edema) Intelligibility: Unable to assess (comment) Respirations During Trial: 15 SpO2 During Trial: 98 % Pulse During Trial: 100 Behavior: Alert;Anxious;Cooperative;Good eye contact;Responsive to questions   Tracheostomy Tube   Shiley #6 cuffed    Vent Dependency  FiO2 (%): 28 %    Cuff Deflation Trial  GO Tolerated Cuff Deflation: Yes Length of Time for Cuff Deflation Trial: 15 Behavior: Alert;Good eye contact;Responsive to questions;Anxious Cuff Deflation Trial - Comments: heavy secretions, strong cough, able to clear secretions by herself, however became nauseous        Joshua Tree, MA, CCC-SLP 05/27/2018 10:04 AM

## 2018-05-28 DIAGNOSIS — E876 Hypokalemia: Secondary | ICD-10-CM | POA: Diagnosis not present

## 2018-05-28 DIAGNOSIS — Z515 Encounter for palliative care: Secondary | ICD-10-CM

## 2018-05-28 DIAGNOSIS — J982 Interstitial emphysema: Secondary | ICD-10-CM

## 2018-05-28 DIAGNOSIS — J189 Pneumonia, unspecified organism: Secondary | ICD-10-CM | POA: Diagnosis not present

## 2018-05-28 LAB — CBC WITH DIFFERENTIAL/PLATELET
Abs Immature Granulocytes: 0.01 10*3/uL (ref 0.00–0.07)
BASOS ABS: 0 10*3/uL (ref 0.0–0.1)
Basophils Relative: 0 %
EOS ABS: 0.2 10*3/uL (ref 0.0–0.5)
Eosinophils Relative: 2 %
HEMATOCRIT: 37.6 % (ref 36.0–46.0)
HEMOGLOBIN: 12.6 g/dL (ref 12.0–15.0)
IMMATURE GRANULOCYTES: 0 %
LYMPHS ABS: 1.3 10*3/uL (ref 0.7–4.0)
LYMPHS PCT: 13 %
MCH: 31.7 pg (ref 26.0–34.0)
MCHC: 33.5 g/dL (ref 30.0–36.0)
MCV: 94.7 fL (ref 80.0–100.0)
Monocytes Absolute: 1.1 10*3/uL — ABNORMAL HIGH (ref 0.1–1.0)
Monocytes Relative: 12 %
NEUTROS PCT: 73 %
NRBC: 0 % (ref 0.0–0.2)
Neutro Abs: 7.2 10*3/uL (ref 1.7–7.7)
Platelets: 346 10*3/uL (ref 150–400)
RBC: 3.97 MIL/uL (ref 3.87–5.11)
RDW: 14.4 % (ref 11.5–15.5)
WBC: 9.8 10*3/uL (ref 4.0–10.5)

## 2018-05-28 LAB — MAGNESIUM: Magnesium: 1.6 mg/dL — ABNORMAL LOW (ref 1.7–2.4)

## 2018-05-28 LAB — COMPREHENSIVE METABOLIC PANEL
ALBUMIN: 3 g/dL — AB (ref 3.5–5.0)
ALK PHOS: 69 U/L (ref 38–126)
ALT: 11 U/L (ref 0–44)
ANION GAP: 8 (ref 5–15)
AST: 15 U/L (ref 15–41)
BUN: <5 mg/dL — ABNORMAL LOW (ref 6–20)
CALCIUM: 8.9 mg/dL (ref 8.9–10.3)
CO2: 26 mmol/L (ref 22–32)
CREATININE: 0.58 mg/dL (ref 0.44–1.00)
Chloride: 101 mmol/L (ref 98–111)
GFR calc non Af Amer: >60 mL/min (ref >60)
GLUCOSE: 127 mg/dL — AB (ref 70–99)
Potassium: 3.1 mmol/L — ABNORMAL LOW (ref 3.5–5.1)
SODIUM: 135 mmol/L (ref 135–145)
Total Bilirubin: 0.5 mg/dL (ref 0.3–1.2)
Total Protein: 6.1 g/dL — ABNORMAL LOW (ref 6.5–8.1)

## 2018-05-28 MED ORDER — LORAZEPAM 2 MG/ML IJ SOLN
2.0000 mg | INTRAMUSCULAR | Status: DC | PRN
  Administered 2018-05-29 – 2018-05-30 (×2): 2 mg via INTRAVENOUS
  Filled 2018-05-28 (×2): qty 1

## 2018-05-28 MED ORDER — POTASSIUM CHLORIDE 10 MEQ/100ML IV SOLN
INTRAVENOUS | Status: AC
  Administered 2018-05-28
  Filled 2018-05-28: qty 100

## 2018-05-28 MED ORDER — METOCLOPRAMIDE HCL 5 MG/ML IJ SOLN
10.0000 mg | Freq: Four times a day (QID) | INTRAMUSCULAR | Status: DC | PRN
  Administered 2018-05-28 – 2018-05-31 (×4): 10 mg via INTRAVENOUS
  Filled 2018-05-28 (×5): qty 2

## 2018-05-28 MED ORDER — HYDROMORPHONE 1 MG/ML IV SOLN
INTRAVENOUS | Status: DC
  Administered 2018-05-29: 25 mg via INTRAVENOUS
  Filled 2018-05-28 (×3): qty 25

## 2018-05-28 MED ORDER — LORAZEPAM 2 MG/ML IJ SOLN
2.0000 mg | Freq: Every day | INTRAMUSCULAR | Status: DC
  Administered 2018-05-28 – 2018-05-29 (×2): 2 mg via INTRAVENOUS
  Filled 2018-05-28 (×2): qty 1

## 2018-05-28 MED ORDER — POTASSIUM CHLORIDE 10 MEQ/100ML IV SOLN
10.0000 meq | INTRAVENOUS | Status: AC
  Administered 2018-05-28 (×2): 10 meq via INTRAVENOUS
  Filled 2018-05-28 (×2): qty 100

## 2018-05-28 MED ORDER — MAGNESIUM SULFATE 2 GM/50ML IV SOLN
2.0000 g | Freq: Once | INTRAVENOUS | Status: AC
  Administered 2018-05-28: 2 g via INTRAVENOUS
  Filled 2018-05-28: qty 50

## 2018-05-28 NOTE — Progress Notes (Signed)
3 Days Post-Op   Subjective/Chief Complaint: Patient c/o of nausea. Breathing well   Objective: Vital signs in last 24 hours: Temp:  [98.1 F (36.7 C)-102.2 F (39 C)] 98.1 F (36.7 C) (10/28 0821) Pulse Rate:  [89-130] 100 (10/28 0826) Resp:  [18-48] 33 (10/28 0826) BP: (114-174)/(68-95) 126/85 (10/28 0826) SpO2:  [87 %-96 %] 94 % (10/28 0826) FiO2 (%):  [28 %] 28 % (10/28 0826) Last BM Date: (unknown)  Intake/Output from previous day: 10/27 0701 - 10/28 0700 In: 2986.1 [I.V.:2638.1; IV Piggyback:348.1] Out: 7782 [Urine:1650] Intake/Output this shift: No intake/output data recorded.  trach open and balloon inflated. no evidence of infection  Lab Results:  Recent Labs    05/28/18 0005  WBC 9.8  HGB 12.6  HCT 37.6  PLT 346   BMET Recent Labs    05/25/18 1610 05/28/18 0005  NA  --  135  K  --  3.1*  CL  --  101  CO2  --  26  GLUCOSE  --  127*  BUN  --  <5*  CREATININE 0.65 0.58  CALCIUM  --  8.9   PT/INR No results for input(s): LABPROT, INR in the last 72 hours. ABG No results for input(s): PHART, HCO3 in the last 72 hours.  Invalid input(s): PCO2, PO2  Studies/Results: Dg Chest Port 1 View  Result Date: 05/28/2018 CLINICAL DATA:  57 year old female with sepsis. EXAM: PORTABLE CHEST 1 VIEW COMPARISON:  Chest radiograph dated 05/27/2018 FINDINGS: Tracheostomy above the carina in similar position. There is no focal consolidation, pleural effusion, or pneumothorax. Stable cardiac silhouette. No acute osseous pathology. Stable bilateral supraclavicular soft tissue emphysema. IMPRESSION: No acute cardiopulmonary process.  No interval change. Electronically Signed   By: Anner Crete M.D.   On: 05/28/2018 00:25   Dg Chest Port 1 View  Result Date: 05/27/2018 CLINICAL DATA:  Pneumothorax EXAM: PORTABLE CHEST 1 VIEW COMPARISON:  05/26/2018 FINDINGS: Tracheostomy in satisfactory position. Lungs are clear.  No pleural effusion or pneumothorax. The heart is  normal in size. Prior pneumomediastinum is not clearly evident on the current study. Stable subcutaneous emphysema in the bilateral supraclavicular regions. IMPRESSION: Prior pneumomediastinum is not clearly evident on the current study. No pneumothorax is seen. Stable subcutaneous emphysema in the bilateral supraclavicular regions. Electronically Signed   By: Julian Hy M.D.   On: 05/27/2018 10:46    Anti-infectives: Anti-infectives (From admission, onward)   Start     Dose/Rate Route Frequency Ordered Stop   05/28/18 0000  vancomycin (VANCOCIN) IVPB 1000 mg/200 mL premix     1,000 mg 200 mL/hr over 60 Minutes Intravenous Every 24 hours 05/27/18 2354     05/28/18 0000  ceFEPIme (MAXIPIME) 1 g in sodium chloride 0.9 % 100 mL IVPB     1 g 200 mL/hr over 30 Minutes Intravenous Every 8 hours 05/27/18 2354        Assessment/Plan: s/p Procedure(s) with comments: DIRECT LARYNGOSCOPY (Bilateral) - with BX TRACHEOSTOMY (Bilateral) I would deflate cuff and change to uncuffed trach on tuesday or wednesday. It would be more appropriate for medicine to take patient's care over once trach changed.   LOS: 3 days    [name] 05/28/2018

## 2018-05-28 NOTE — Progress Notes (Signed)
RT NOTES: Patient has order for trach tube change to uncuffed #6 shiley. Patient refuses to have trach tube changed. Says she will be meeting with palliative care today. RN aware.

## 2018-05-28 NOTE — Care Management Important Message (Signed)
Important Message  Patient Details  Name: [name] MRN: [id number] Date of Birth: June 14, 1961   Medicare Important Message Given:  Yes    [name] 05/28/2018, 3:32 PM

## 2018-05-28 NOTE — Progress Notes (Signed)
SLP Cancellation Note  Patient Details Name: PARISHA BEAULAC MRN: [id number] DOB: 1961-07-10   Cancelled treatment:        Cancelled due to patient having increased secretions, order for cuff to be left inflated. Will see at next possible time.   Charlynne Cousins Marabella Popiel, MA, CCC-SLP 05/28/2018 9:05 AM

## 2018-05-28 NOTE — Progress Notes (Signed)
NAME:  [name], MRN:  [id number], DOB:  05/08/1961, LOS: 3 ADMISSION DATE:  05/25/2018, CONSULTATION DATE:  05/25/18 REFERRING MD:  Dr. Janace Hoard, CHIEF COMPLAINT:  Lung Nodules    Brief History   57 y/o F who presented to [facility] on 10/25 for bx of supraglottic laryngeal mass and tracheostomy with ptx post op.  Past Medical History  GERD, depression, chronic back pain, fibromyalgia, cervical cancer, anxiety, osteoporosis, laryngeal mass, smoker  Significant Hospital Events   10/25  Admit    Consults: date of consult/date signed off & final recs:  10/25 PCCM pulmonary critical care signing off 05/28/2018  Procedures (surgical and bedside):  10/25 Tracheostomy placement per Dr. 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  Emphysema with new bilateral upper lung pulmonary nodules that are suspicious for metastatic disease Cytology 10/25 >>   Micro Data:    Antimicrobials:    Subjective:  Miserable with trach but denies sob/ cp/ bloody secretions    Objective   Blood pressure 126/85, pulse (!) 103, temperature 98.1 F (36.7 C), temperature source Oral, resp. rate (!) 22, height 5\' 7"  (1.702 m), weight 46.1 kg, last menstrual period 09/29/2003, SpO2 95 %.    FiO2 (%):  [28 %] 28 %   Intake/Output Summary (Last 24 hours) at 05/28/2018 1216 Last data filed at 05/28/2018 1100 Gross per 24 hour  Intake 2390.16 ml  Output 1350 ml  Net 1040.16 ml   Filed Weights   05/25/18 0841  Weight: 46.1 kg    Examination: General chronically ill elderly wf nad  Wt Readings from Last 3 Encounters:  05/25/18 46.1 kg  05/21/18 46.1 kg  04/21/18 48.1 kg      General: Frail cachectic female who is awake alert HEENT: Tracheostomy in place, current sputum noted from tracheal discharge, Neuro: Intact follows commands able to right CV: s1s2 rrr, no m/r/g PULM: even/non-labored, lungs bilaterally clear NG:EXBM, non-tender, bsx4 active  Extremities: warm/dry, negative edema  Skin: no rashes or lesions      I personally reviewed images and agree with radiology impression as follows:  CXR:  10/26 1. Evidence of pneumomediastinum and bilateral supraclavicular subcutaneous gas appears stable since the CT yesterday. 2. No new cardiopulmonary abnormality.  Chest x-ray reviewed by me 05/28/2018 shows resolution of pneumothorax with essentially normal chest x-ray  Resolved Hospital Problem list     Assessment & Plan:   Pneumomediastinum and small bilateral PTX Resolved on chest x-ray from [place] continue current current treatment Pulmonary critical care will sign off at this time     Disposition / Summary of Today's Plan 05/28/18   F/u am cxr  10/27        Labs   CBC: Recent Labs  Lab 05/28/18 0005  WBC 9.8  NEUTROABS 7.2  HGB 12.6  HCT 37.6  MCV 94.7  PLT 841    Basic Metabolic Panel: Recent Labs  Lab 05/25/18 1610 05/28/18 0005  NA  --  135  K  --  3.1*  CL  --  101  CO2  --  26  GLUCOSE  --  127*  BUN  --  <5*  CREATININE 0.65 0.58  CALCIUM  --  8.9  MG  --  1.6*   GFR: Estimated Creatinine Clearance: 55.1 mL/min (by C-G formula based on SCr of 0.58 mg/dL). Recent Labs  Lab 05/28/18 0005  WBC 9.8    Liver Function Tests: Recent Labs  Lab 05/28/18 0005  AST 15  ALT 11  ALKPHOS 69  BILITOT 0.5  PROT 6.1*  ALBUMIN 3.0*   No results for input(s): LIPASE, AMYLASE in the last 168 hours. No results for input(s): AMMONIA in the last 168 hours.  ABG    Component Value Date/Time   PHART 7.404 05/30/2014 0400   PCO2ART 38.1 05/30/2014 0400   PO2ART 89.1 05/30/2014 0400   HCO3 23.3  05/30/2014 0400   TCO2 24.5 05/30/2014 0400   ACIDBASEDEF 0.8 05/30/2014 0400   O2SAT 96.9 05/30/2014 0400     Coagulation Profile: No results for input(s): INR, PROTIME in the last 168 hours.  Cardiac Enzymes: No results for input(s): CKTOTAL, CKMB, CKMBINDEX, TROPONINI in the last 168 hours.  HbA1C: No results found for: HGBA1C  CBG: Recent Labs  Lab 05/26/18 2216  GLUCAP 111*     Steve Minor ACNP Maryanna Shape PCCM Pager [phone] till 1 pm If no answer page [phone] 05/28/2018, 12:16 PM

## 2018-05-28 NOTE — Progress Notes (Addendum)
Palliative Care Progress Note  [name] is doing much better today I met with the patient, her daughter and granddaughter this evening to discuss her condition and goals of care. In general, [name] was much less irritable than I expected her to be-she was even able to smile at humor and communicate perfectly in writing on a dry erase board. She has baseline severe anxiety, requires extra time and reassurance explaining next steps and procedures and needs to connect with the staff and providers caring for her to feel safe and not threatened- otherwise she can be  irritable and difficult.  I spent time in the room explaining a plan for next steps and answered her questions- she refused the "trach change" only because she did not really understand what was required and got worked up about it. She is now agreeable. I will try to be available to be present during that procedure if it would help - would consider giving additional dose of IV ativan 15 min prior to this. Hopefully once this is done she will progress to eating and speaking and adapting to the trach.  Problems:  1. Post-op Trach and excisional biopsy throat/glottis mass-suspect cancer 2. Possible Lung Mets or Lung Ca 3. Protein Calorie Malnutrition, Progressive unexplained weight loss now felt to be related to cancer  4. Chronic Pain, Opioid Dependence 5. Severe Generalized Anxiety Disorder/Panic Episodes 6. Fever, possible HCAP, being treated empirically, await cultures 7. Hypokalemia: repleted 8. Hypomagnesemia: repeleted  Recs/Plan summary:  1. Trach change tomorrow per Dr. Janace Hoard 2. Continue HCAP coverage until cultures return 3. Follow-up on pathology report and discuss with family as soon as this is available-another source of anxiety for them. 4. Will switch IV meds to oral 24 hours prior to discharge if possible, remains NPO currently. 5. K and Mg repleted, once taking PO please get nutrition consult 6. Increased her bolus dose  of hydromorphone PCA 7. Increased her Ativan dose- she told me tonight that she can take 3-4 (0.5) alprazolam to go to sleep which equals about 63m of Ativan.  Will continue to follow.  Total Time: 40 min Greater than 50%  of this time was spent counseling and coordinating care related to the above assessment and plan.  ELane Hacker DO Palliative Medicine [phone].

## 2018-05-29 ENCOUNTER — Inpatient Hospital Stay (HOSPITAL_COMMUNITY): Payer: Medicare Other

## 2018-05-29 MED ORDER — BISACODYL 5 MG PO TBEC
10.0000 mg | DELAYED_RELEASE_TABLET | Freq: Once | ORAL | Status: AC
  Administered 2018-05-29: 10 mg via ORAL
  Filled 2018-05-29: qty 2

## 2018-05-29 MED ORDER — RESOURCE THICKENUP CLEAR PO POWD
Freq: Once | ORAL | Status: AC
  Administered 2018-05-29: 14:00:00 via ORAL
  Filled 2018-05-29: qty 125

## 2018-05-29 MED ORDER — MAGNESIUM SULFATE 2 GM/50ML IV SOLN
2.0000 g | Freq: Once | INTRAVENOUS | Status: AC
  Administered 2018-05-29: 2 g via INTRAVENOUS
  Filled 2018-05-29: qty 50

## 2018-05-29 NOTE — Progress Notes (Signed)
  Speech Language Pathology Treatment: Nada Boozer Speaking valve  Patient Details Name: [name] MRN: [id number] DOB: 1961-01-12 Today's Date: 05/29/2018 Time: 1200-1220 SLP Time Calculation (min) (ACUTE ONLY): 20 min  Assessment / Plan / Recommendation Clinical Impression  Pt's trach changed to cuffless #6 prior to my arrival.  Passy-Muir valve in place.  Pt with excellent toleration of valve with no change in vital signs.  She reports comfort with PMV. We discussed function of valve and she looked at drawings of airflow changes.  We reviewed how to place and remove valve and cleaning process.  Pt used mirror to practice placement/removal and she was able to execute independently by end of session.  We reviewed circumstances under which it should be removed: during sleep, cleaning, and if pt has difficulty breathing.  Ms. Oberman was able to restate these conditions.  We also discussed the value of proceeding with MBS to determine readiness to resume POs.  Pt agreed; scheduled for 1:30 today.    HPI HPI: 57 y/o F who presented to [facility] on 10/25 for bx of left supraglottic laryngeal mass and tracheostomy with ptx post op. PMH includes GERD, depression, chronic back pain, fibromyalgia, cervical cancer, anxiety, osteoporosis.  Pt is being followed by Palliative Medicine.  She had trach changed 10/29 to a cuffless #6. 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  Emphysema with new bilateral upper lung pulmonary nodules that are suspicious for metastatic disease.      SLP Plan  Continue with current plan of care       Recommendations         Patient may use Passy-Muir Speech Valve: During all waking hours (remove during  sleep) PMSV Supervision: Intermittent         SLP Visit Diagnosis: Aphonia (R49.1) Plan: Continue with current plan of care       GO             Zaiya Annunziato L. [name], [place] CCC/SLP Acute Rehabilitation Services Office number [phone] Pager [phone]    [name] 05/29/2018, 4:09 PM

## 2018-05-29 NOTE — Progress Notes (Signed)
Call received from and case discussed with the admitting/consulting hospitalist physician re: transfer to medicine service. Since this patient is well known to me and a plan now seems to be in place for her medical management and progression I will assume attending role for continuity purposes and at patient's request. Appreciate Dr. Janace Hoard switching her trach this AM to cuffless and for the RT and RN assistance in care-trach progression. Also appreciate SLP helping with complete assessment+recs for speaking, swallowing and helping to expedite safe PO intake- known prior history of dysphagia and aspiration.  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 I discussed these results with the patient- this was expected.  Plan of Care:  1. Once patient has demonstrated ability to take PO will transition her off PCA and start an oral regimen.  2. Trach team cannot see her until Thursday based on their volumes and availability for home education and care transition needs.  3. Will need to schedule a follow-up appt with oncology head and neck program ASAP.  4. Nutrition Consult  5. Repeat AM labs  6. HCAP antibiotics-she is clinically much improved, although secretions remain thick and dark. Can narrow abx scope but she probably needs full course to cover for PNA.  7. Bowel regimen ordered with some additional Magnesium IV which will help stimulate bowels.  Lane Hacker, DO Palliative Medicine

## 2018-05-29 NOTE — Progress Notes (Signed)
RT NOTES: Patient has trach tube change order placed. Patient had trach placed 4 days ago (05/25/2018) by ENT. Due to policy RT is unable to safely change trach tube at this time. RN made aware and will notify MD.

## 2018-05-29 NOTE — Progress Notes (Deleted)
RT NOTES: Trach tube change order in place. Patient refuses to have trach tube changed at this time. Wants to talk to her primary care physician. Nurse aware.

## 2018-05-29 NOTE — Progress Notes (Signed)
4 Days Post-Op   Subjective/Chief Complaint: She is angry and having frustration.    Objective: Vital signs in last 24 hours: Temp:  [97.8 F (36.6 C)-98.9 F (37.2 C)] 97.8 F (36.6 C) (10/29 0745) Pulse Rate:  [70-121] 111 (10/29 1119) Resp:  [15-32] 17 (10/29 1119) BP: (128-159)/(75-94) 146/88 (10/29 1119) SpO2:  [95 %-100 %] 98 % (10/29 1119) FiO2 (%):  [28 %] 28 % (10/29 1119) Last BM Date: (unknown)  Intake/Output from previous day: 10/28 0701 - 10/29 0700 In: 2563.3 [I.V.:1302; IV Piggyback:1261.3] Out: 1000 [Urine:1000] Intake/Output this shift: No intake/output data recorded.  trach looks good and no breathing trouble.   Lab Results:  Recent Labs    05/28/18 0005  WBC 9.8  HGB 12.6  HCT 37.6  PLT 346   BMET Recent Labs    05/28/18 0005  NA 135  K 3.1*  CL 101  CO2 26  GLUCOSE 127*  BUN <5*  CREATININE 0.58  CALCIUM 8.9   PT/INR No results for input(s): LABPROT, INR in the last 72 hours. ABG No results for input(s): PHART, HCO3 in the last 72 hours.  Invalid input(s): PCO2, PO2  Studies/Results: Dg Chest Port 1 View  Result Date: 05/28/2018 CLINICAL DATA:  57 year old female with sepsis. EXAM: PORTABLE CHEST 1 VIEW COMPARISON:  Chest radiograph dated 05/27/2018 FINDINGS: Tracheostomy above the carina in similar position. There is no focal consolidation, pleural effusion, or pneumothorax. Stable cardiac silhouette. No acute osseous pathology. Stable bilateral supraclavicular soft tissue emphysema. IMPRESSION: No acute cardiopulmonary process.  No interval change. Electronically Signed   By: Anner Crete M.D.   On: 05/28/2018 00:25    Anti-infectives: Anti-infectives (From admission, onward)   Start     Dose/Rate Route Frequency Ordered Stop   05/28/18 0000  vancomycin (VANCOCIN) IVPB 1000 mg/200 mL premix     1,000 mg 200 mL/hr over 60 Minutes Intravenous Every 24 hours 05/27/18 2354     05/28/18 0000  ceFEPIme (MAXIPIME) 1 g in sodium  chloride 0.9 % 100 mL IVPB     1 g 200 mL/hr over 30 Minutes Intravenous Every 8 hours 05/27/18 2354        Assessment/Plan: s/p Procedure(s) with comments: DIRECT LARYNGOSCOPY (Bilateral) - with BX TRACHEOSTOMY (Bilateral) will get hospitalist to take over patient care after the trach change to #6 uncuffed. Resp refused to change yesterday. Trach changed to #6 uncuffed and capped. She is breathing well and talking. Need pulmonary for lung evaluation.   LOS: 4 days    Melissa Montane 05/29/2018

## 2018-05-29 NOTE — Progress Notes (Signed)
Modified Barium Swallow Progress Note  Patient Details  Name: [name] MRN: [id number] Date of Birth: [birth year]  Today's Date: 05/29/2018  Modified Barium Swallow completed.  Full report located under Chart Review in the Imaging Section.  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  Thin and nectar-thick liquids are aspirated (with cough response) during the swallow due to inadequate laryngeal vestibular closure.  Purees and solids were swallowed without penetration/aspiration and with good clearance through esophagus.  Postural adjustments were attempted in an effort to improve airway protection - with subjectively smaller boluses and a head tilt to the left (left ear to left shoulder) pt demonstrated improved airway protection with nectar-thick liquids.  There was no aspiration and occasional penetration above the vocal folds.  Thin liquids continued to be aspirated. None of the other postural adjustments were as effective.  Pt is able to carry-out precautions independently. Recommend beginning a dysphagia 3 diet; nectar-thick liquids (straws are fine); crush meds in puree.  Pt must wear PMV during all PO intake.  SLP will follow briefly for education, PO tolerance.  Education completed with pt/daughter.  We reviewed video of MBS and I answered questions.    Swallow Evaluation Recommendations       SLP Diet Recommendations: Dysphagia 3 (Mech soft) solids;Nectar thick liquid   Liquid Administration via: Cup;Straw   Medication Administration: Crushed with puree   Supervision: Patient able to self feed   Compensations: Other (Comment)(head tilt to left; must wear PMV)       Oral Care Recommendations: Oral care BID   Other Recommendations: Order thickener from [place]. [name], [name] Office  number [phone] Pager [phone]  [name] 05/29/2018,4:35 PM

## 2018-05-29 NOTE — Progress Notes (Signed)
RT NOTES: Trach tube changed by Dr Janace Hoard ENT to #6 Shiley cuffless. Patient suctioned for moderate thick yellow secretions. Patient capped at this time and on room air.

## 2018-05-30 LAB — GLUCOSE, CAPILLARY: GLUCOSE-CAPILLARY: 118 mg/dL — AB (ref 70–99)

## 2018-05-30 LAB — BASIC METABOLIC PANEL
Anion gap: 8 (ref 5–15)
CALCIUM: 8.6 mg/dL — AB (ref 8.9–10.3)
CHLORIDE: 104 mmol/L (ref 98–111)
CO2: 26 mmol/L (ref 22–32)
CREATININE: 0.55 mg/dL (ref 0.44–1.00)
GFR calc non Af Amer: >60 mL/min (ref >60)
Glucose, Bld: 104 mg/dL — ABNORMAL HIGH (ref 70–99)
Potassium: 3.6 mmol/L (ref 3.5–5.1)
SODIUM: 138 mmol/L (ref 135–145)

## 2018-05-30 LAB — CULTURE, RESPIRATORY W GRAM STAIN: Culture: NORMAL

## 2018-05-30 LAB — CULTURE, RESPIRATORY

## 2018-05-30 MED ORDER — LORAZEPAM 2 MG/ML IJ SOLN: 2.0000 mg | INTRAMUSCULAR | Status: DC | PRN

## 2018-05-30 MED ORDER — FAMOTIDINE 20 MG PO TABS
20.0000 mg | ORAL_TABLET | Freq: Every day | ORAL | Status: DC
  Administered 2018-05-30: 20 mg via ORAL
  Filled 2018-05-30: qty 1

## 2018-05-30 MED ORDER — ALPRAZOLAM 0.5 MG PO TABS
2.0000 mg | ORAL_TABLET | Freq: Every day | ORAL | Status: DC
  Administered 2018-05-30: 2 mg via ORAL
  Filled 2018-05-30: qty 4

## 2018-05-30 MED ORDER — HYDROMORPHONE HCL 2 MG PO TABS: 4.0000 mg | ORAL_TABLET | Freq: Four times a day (QID) | ORAL | Status: DC | PRN

## 2018-05-30 MED ORDER — AZITHROMYCIN 250 MG PO TABS
250.0000 mg | ORAL_TABLET | Freq: Every day | ORAL | Status: DC
  Administered 2018-05-30 – 2018-05-31 (×2): 250 mg via ORAL
  Filled 2018-05-30 (×2): qty 1

## 2018-05-30 MED ORDER — POLYETHYLENE GLYCOL 3350 17 G PO PACK
17.0000 g | PACK | Freq: Every day | ORAL | Status: DC
  Administered 2018-05-30 – 2018-05-31 (×2): 17 g via ORAL
  Filled 2018-05-30 (×2): qty 1

## 2018-05-30 MED ORDER — HYDROMORPHONE HCL 1 MG/ML IJ SOLN: 1.0000 mg | INTRAMUSCULAR | Status: DC | PRN

## 2018-05-30 MED ORDER — MORPHINE SULFATE ER 100 MG PO TBCR
200.0000 mg | EXTENDED_RELEASE_TABLET | Freq: Two times a day (BID) | ORAL | Status: DC
  Administered 2018-05-30 – 2018-05-31 (×2): 200 mg via ORAL
  Filled 2018-05-30 (×2): qty 2

## 2018-05-30 MED ORDER — ALPRAZOLAM 0.5 MG PO TABS: 2.0000 mg | ORAL_TABLET | Freq: Three times a day (TID) | ORAL | Status: DC | PRN

## 2018-05-30 MED ORDER — MAGNESIUM HYDROXIDE 400 MG/5ML PO SUSP
30.0000 mL | Freq: Every day | ORAL | Status: DC
  Administered 2018-05-30 – 2018-05-31 (×2): 30 mL via ORAL
  Filled 2018-05-30 (×2): qty 30

## 2018-05-30 NOTE — Progress Notes (Addendum)
[name] is doing well today. She is able to take PO carefully and with techniques discussed with SLP. Her trach is capped.  Her goal is to get home as soon as possible. We discussed that transition in detail and next steps for her care. She has very high levels of anxiety.   Problem List: Newly diagnosed Laryngeal Cancer, sp tracheostomy Moderate-Severe COPD Protein Calorie Malnutritioin Opioid Induced Constipation Cancer Related Pain  Plan of Care:  1. Transition PCA or LA and IR opioid regimen, calculated oral morphine equivalents >300  Start MS Contin 200 BID, hydromorphone 4mg  q6 prn breakthrough pain 2. Transitioned IV meds to PO 3. Nutrition Consult 4. Can discontinue continuous fluids and telemetyry monitoring 5. Will solidify discharge transition plans tomorrow. 6. Aggressive Bowel Regimen- can use Amitiza-she wasn't filling the Relistor.  Time: 35 minutes. Greater than 50%  of this time was spent counseling and coordinating care related to the above assessment and plan.  Lane Hacker, DO Palliative Medicine [phone]

## 2018-05-30 NOTE — Progress Notes (Signed)
5 Days Post-Op   Subjective/Chief Complaint: She is much better spirits today. No trach or throat complaints   Objective: Vital signs in last 24 hours: Temp:  [97.4 F (36.3 C)-98 F (36.7 C)] 97.7 F (36.5 C) (10/30 0427) Pulse Rate:  [75-121] 92 (10/30 0427) Resp:  [16-35] 21 (10/30 0427) BP: (117-159)/(78-100) 138/87 (10/30 0427) SpO2:  [93 %-100 %] 98 % (10/30 0427) FiO2 (%):  [28 %] 28 % (10/30 0300) Last BM Date: 05/22/18  Intake/Output from previous day: 10/29 0701 - 10/30 0700 In: 250 [IV Piggyback:250] Out: 350 [Urine:350] Intake/Output this shift: No intake/output data recorded.  trach with passey muir valve. breathing well. no problems.  Lab Results:  Recent Labs    05/28/18 0005  WBC 9.8  HGB 12.6  HCT 37.6  PLT 346   BMET Recent Labs    05/28/18 0005 05/30/18 0241  NA 135 138  K 3.1* 3.6  CL 101 104  CO2 26 26  GLUCOSE 127* 104*  BUN <5* <5*  CREATININE 0.58 0.55  CALCIUM 8.9 8.6*   PT/INR No results for input(s): LABPROT, INR in the last 72 hours. ABG No results for input(s): PHART, HCO3 in the last 72 hours.  Invalid input(s): PCO2, PO2  Studies/Results: Dg Swallowing Func-speech Pathology  Result Date: 05/29/2018 Objective Swallowing Evaluation: Type of Study: MBS-Modified Barium Swallow Study  Patient Details Name: [name] MRN: [id number] Date of Birth: [birth year] Today's Date: 05/29/2018 Time: SLP Start Time (ACUTE ONLY): 1340 -SLP Stop Time (ACUTE ONLY): 1420 SLP Time Calculation (min) (ACUTE ONLY): 40 min Past Medical History: Past Medical History: Diagnosis Date . Anxiety  . Arthritis  . Cervical cancer ([place])   Discovered by pap smear, treated by Dr. Charlesetta Garibaldi with LEEP procedure.  Pap since that time has been normal. . Chronic back pain    her CT of the head without contrast June 28, 2008- moderate to large right paracentral disc protrusion at C4-C5 resulting in a right C5 neuroforaminal stenosis and probably some degree of  spinal stenosis. C. bilateral C6 neuroforaminal stenosis related to  degenerative hypertrophy, no acute fracture or loose pieces identifying the cervical spine, ligamentous injury is not excluded.   Marland Kitchen COPD (chronic obstructive pulmonary disease) ([place])  . Depression  . Fibromyalgia  . GERD (gastroesophageal reflux disease)  October 2011   [place], esophagogram - tiny amount of gastroesophageal reflux otherwise unremarkable exam, done by Dr. Dellis Filbert . Headache(784.0)  . Hemorrhoids  . Laryngeal mass  . Osteoporosis   T score L femur neck = -2.6 (02/21/12) Past Surgical History: Past Surgical History: Procedure Laterality Date . ADENOIDECTOMY   . CATARACT EXTRACTION W/ INTRAOCULAR LENS  IMPLANT, BILATERAL   . CERVICAL CONE BIOPSY    leep procedure . DIRECT LARYNGOSCOPY Bilateral 05/25/2018  Procedure: DIRECT LARYNGOSCOPY;  Surgeon: Melissa Montane, MD;  Location: [place];  Service: ENT;  Laterality: Bilateral;  with BX . MULTIPLE EXTRACTIONS WITH ALVEOLOPLASTY  06/18/2012  Procedure: MULTIPLE EXTRACION WITH ALVEOLOPLASTY;  Surgeon: Gae Bon, DDS;  Location: [facility];  Service: Oral Surgery;  Laterality: Bilateral; . TONSILLECTOMY   . TRACHEOSTOMY TUBE PLACEMENT Bilateral 05/25/2018  Procedure: TRACHEOSTOMY;  Surgeon: Melissa Montane, MD;  Location: [facility] OR;  Service: ENT;  Laterality: Bilateral; HPI: 57 y/o F who presented to [facility] on 10/25 for bx of left supraglottic laryngeal mass and tracheostomy with ptx post op. PMH includes GERD, depression, chronic back pain, fibromyalgia, cervical cancer, anxiety, osteoporosis.  Pt is being followed by Palliative Medicine.  She had trach changed 10/29 to a cuffless #6. 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  Emphysema  with new bilateral upper lung pulmonary nodules that are suspicious for metastatic disease.  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  Thin and nectar-thick liquids are aspirated (with cough response) during the swallow due to inadequate laryngeal vestibular closure.  Purees and solids were swallowed without penetration/aspiration and with good clearance through esophagus.  Postural adjustments were attempted in an effort to improve airway protection - with subjectively smaller boluses and a head tilt to the left (left ear to left shoulder) pt demonstrated improved airway protection with nectar-thick liquids.  There was no aspiration and occasional penetration above the vocal folds.  Thin liquids continued to be aspirated. None of the other postural adjustments were as effective.  Pt is able to carry-out precautions independently. Recommend beginning a dysphagia 3 diet; nectar-thick liquids (straws are fine); crush meds in puree.  Pt must wear PMV during all PO intake.  SLP will follow briefly for education, PO tolerance.  Education completed with pt/daughter.  We reviewed video of MBS and I answered questions.  SLP Visit Diagnosis Dysphagia, pharyngeal phase (R13.13) Attention and concentration deficit following -- Frontal lobe and executive function deficit following -- Impact on safety and function Mild aspiration risk   CHL IP TREATMENT RECOMMENDATION 05/29/2018 Treatment Recommendations Therapy as outlined in treatment plan below   Prognosis 05/29/2018 Prognosis for Safe Diet Advancement Fair Barriers to Reach Goals -- Barriers/Prognosis Comment -- CHL IP DIET RECOMMENDATION 05/29/2018 SLP Diet Recommendations Dysphagia 3 (Mech soft) solids;Nectar thick liquid Liquid Administration via Cup;Straw Medication Administration  Crushed with puree Compensations Other (Comment) Postural Changes --   CHL IP OTHER RECOMMENDATIONS 05/29/2018 Recommended Consults -- Oral Care Recommendations Oral care BID Other Recommendations Order thickener from pharmacy   CHL IP FOLLOW UP RECOMMENDATIONS 05/29/2018 Follow up Recommendations Other (comment)   CHL IP FREQUENCY AND DURATION 05/29/2018 Speech Therapy Frequency (ACUTE ONLY) min 2x/week Treatment Duration 1 week      CHL IP ORAL PHASE 05/29/2018 Oral Phase WFL Oral - Pudding Teaspoon -- Oral - Pudding Cup -- Oral - Honey Teaspoon -- Oral - Honey Cup -- Oral - Nectar Teaspoon -- Oral - Nectar Cup -- Oral - Nectar Straw -- Oral - Thin Teaspoon -- Oral - Thin Cup -- Oral - Thin Straw -- Oral - Puree -- Oral - Mech Soft -- Oral - Regular -- Oral - Multi-Consistency -- Oral - Pill -- Oral Phase - Comment --  CHL IP PHARYNGEAL PHASE 05/29/2018 Pharyngeal Phase Impaired Pharyngeal- Pudding Teaspoon -- Pharyngeal -- Pharyngeal- Pudding Cup -- Pharyngeal -- Pharyngeal- Honey Teaspoon -- Pharyngeal -- Pharyngeal- Honey Cup -- Pharyngeal -- Pharyngeal- Nectar Teaspoon -- Pharyngeal -- Pharyngeal- Nectar Cup -- Pharyngeal -- Pharyngeal- Nectar Straw Reduced airway/laryngeal closure;Penetration/Aspiration during swallow;Trace aspiration;Compensatory strategies attempted (with notebox) Pharyngeal Material enters airway, passes BELOW cords and not ejected out despite cough attempt by patient;Material enters airway, remains ABOVE vocal cords then ejected out Pharyngeal- Thin Teaspoon -- Pharyngeal -- Pharyngeal- Thin Cup -- Pharyngeal -- Pharyngeal- Thin Straw Reduced airway/laryngeal closure;Trace aspiration;Penetration/Aspiration  during swallow Pharyngeal Material enters airway, passes BELOW cords and not ejected out despite cough attempt by patient Pharyngeal- Puree WFL Pharyngeal -- Pharyngeal- Mechanical Soft -- Pharyngeal -- Pharyngeal- Regular WFL Pharyngeal -- Pharyngeal- Multi-consistency -- Pharyngeal --  Pharyngeal- Pill -- Pharyngeal -- Pharyngeal Comment --  No flowsheet data found. [name] 05/29/2018, 4:37 PM               Anti-infectives: Anti-infectives (From admission, onward)   Start     Dose/Rate Route Frequency Ordered Stop   05/28/18 0000  vancomycin (VANCOCIN) IVPB 1000 mg/200 mL premix     1,000 mg 200 mL/hr over 60 Minutes Intravenous Every 24 hours 05/27/18 2354     05/28/18 0000  ceFEPIme (MAXIPIME) 1 g in sodium chloride 0.9 % 100 mL IVPB     1 g 200 mL/hr over 30 Minutes Intravenous Every 8 hours 05/27/18 2354        Assessment/Plan: s/p Procedure(s) with comments: DIRECT LARYNGOSCOPY (Bilateral) - with BX TRACHEOSTOMY (Bilateral) Thnak you for Dr Hilma Favors for taking over her care. I have no input at this point. I asked her if she wants radiation oncology  consult and she refused. 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 Im not sure why the patient has a Passy-Muir valve. She was plugged yesterday and had no breathing trouble and was talking. I think it is easier to keep trach plugged and possibly change to a # 4 trach just to keep a airway for any need as time passes with the tumor growing.   LOS: 5 days    Melissa Montane 05/30/2018

## 2018-05-30 NOTE — Progress Notes (Signed)
  Speech Language Pathology Treatment: Dysphagia  Patient Details Name: [name] MRN: [id number] DOB: 1961-03-19 Today's Date: 05/30/2018 Time: 8250-0370 SLP Time Calculation (min) (ACUTE ONLY): 14 min  Assessment / Plan / Recommendation Clinical Impression  F/u after yesterday's assessments.  Per RN, pt's cap was removed yesterday due to pt's complaints of difficulty breathing.  Today, cap is back in place upon entering room; pt describes improved comfort.  She is in achieving hoarse/breathy voice.  She complains of stomach pain, and therefore ate little breakfast.  We reviewed benefit of nectar-thick liquids, head tilt to left for airway protection.  Pt follows through with head tilt intermittently.  She coughs when she does not- reviewed with her that coughing is + sign of aspiration. Her mood is more pensive today.   Pt needs clarification if she can use cap at night while sleeping.  We put PMV back in packaging in the event she needs it at a later date.  Recommend continued dys 3 diet, nectar liquids with precautions, at least for now.      HPI HPI: 57 y/o F who presented to [facility] on 10/25 for bx of left supraglottic laryngeal mass and tracheostomy with ptx post op. PMH includes GERD, depression, chronic back pain, fibromyalgia, cervical cancer, anxiety, osteoporosis.  Pt is being followed by Palliative Medicine.  She had trach changed 10/29 to a cuffless #6. 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  Emphysema with new bilateral upper lung pulmonary nodules that are suspicious for metastatic disease.      SLP Plan  Continue with current plan of care       Recommendations  Diet recommendations: Dysphagia 3  (mechanical soft);Nectar-thick liquid Liquids provided via: Straw Medication Administration: Crushed with puree Supervision: Patient able to self feed Compensations: Other (Comment)(head tilt to left; small sips; cap or PMV in place) Postural Changes and/or Swallow Maneuvers: Head tilt left during swallow               Follow up Recommendations: None Plan: Continue with current plan of care       GO                [name] 05/30/2018, 9:14 AM   Estill Bamberg L. [name], [place] Office number [phone] Pager [phone]

## 2018-05-30 NOTE — Progress Notes (Addendum)
0000 2.67 mg PCA med used 40 demands and 7 delivered  0400- PCA 27 attempts, 5 delivered 2.12 mg given Dilaudid   05/30/1999- PCA demand 81 attempts dilaudid 8.12 mg given

## 2018-05-30 NOTE — Care Management Note (Signed)
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 New tracheostomy.         Action/Plan:  Spoke w patient at bedside to discuss dispo. She is from home alone, independent PTA. Her only living family around who could help is her daughter who recently lost her husband and her son. Patient does also have a sister who she states is hospitalized somewhere on the 4th floor currently and would not be able to assist at DC. PTA patient has home oxygen and nebulizer through [facility]. Has RW and cane. Spoke to her about would be able to assist her at DC. She stated that she wanted to go back home alone. Discussed how that would not be a safe disposition, to go home alone with a fresh trach. Approached the idea of her stayimg with her daughter for a week or two after DC. She stated she would ask her tomorrow when the trach team is scheduled to come to teach both of them how to care for trach at home.    Patient would like to use [facility] for [facility] services. Will likely DC to daughter's house.  Needs DME orders for trach supplies and home suction, as well as HH RN and 24 hour notice to [facility] to have respiratory therapist available for admission process on day of discharge.     Expected Discharge Date:  05/31/18               Expected Discharge Plan:     In-House Referral:     Discharge planning Services  CM Consult  Post Acute Care Choice:    Choice offered to:     DME Arranged:    DME Agency:     HH Arranged:    HH Agency:     Status of Service:  In process, will continue to follow  If discussed at Long Length of Stay Meetings, dates discussed:    Additional Comments:  [name], RN 05/30/2018, 2:49 PM

## 2018-05-31 ENCOUNTER — Other Ambulatory Visit: Payer: Self-pay

## 2018-05-31 DIAGNOSIS — Z930 Tracheostomy status: Secondary | ICD-10-CM

## 2018-05-31 MED ORDER — MAGNESIUM HYDROXIDE 400 MG/5ML PO SUSP: 30.0000 mL | Freq: Every day | ORAL | 0 refills | Status: DC

## 2018-05-31 MED ORDER — BOOST PO LIQD: 237.0000 mL | Freq: Three times a day (TID) | ORAL | 0 refills | Status: AC

## 2018-05-31 MED ORDER — FAMOTIDINE 20 MG PO TABS: 20.0000 mg | ORAL_TABLET | Freq: Every day | ORAL | 3 refills | Status: AC

## 2018-05-31 MED ORDER — ALPRAZOLAM 2 MG PO TABS: 2.0000 mg | ORAL_TABLET | Freq: Three times a day (TID) | ORAL | 3 refills | Status: AC | PRN

## 2018-05-31 MED ORDER — HYDROMORPHONE HCL 4 MG PO TABS: 4.0000 mg | ORAL_TABLET | Freq: Two times a day (BID) | ORAL | 0 refills | Status: DC | PRN

## 2018-05-31 MED ORDER — MORPHINE SULFATE ER 200 MG PO TBCR: 200.0000 mg | EXTENDED_RELEASE_TABLET | Freq: Two times a day (BID) | ORAL | 0 refills | Status: DC

## 2018-05-31 MED ORDER — AZITHROMYCIN 250 MG PO TABS: 250.0000 mg | ORAL_TABLET | Freq: Every day | ORAL | 3 refills | Status: AC

## 2018-05-31 MED ORDER — LUBIPROSTONE 24 MCG PO CAPS: 24.0000 ug | ORAL_CAPSULE | Freq: Two times a day (BID) | ORAL | 3 refills | Status: AC

## 2018-05-31 NOTE — Consult Note (Signed)
   [facility] CM Inpatient Consult   05/31/2018  [name] [facility] November 13, 1960 [id number]  Patient evaluated for community based chronic disease management services with [facility] as a benefit of patient's Medicare Insurance. Spoke with patient at bedside to explain [facility] services. Consent for signed.  Patient will receive post hospital discharge call and will be evaluated for services needed and disease process education.  Patient is with a new tracheostomy.  She will have home health care follow up. Gave patient information and [facility] literature at bedside. Made Inpatient Case Manager aware that [facility] following. Of note, [facility] Care Management services does not replace or interfere with any services that are arranged by inpatient case management or social work.  For additional questions or referrals please contact:     Natividad Brood, RN BSN [facility] Liaison  [phone] business mobile phone Toll free office [phone]

## 2018-05-31 NOTE — Care Management Note (Addendum)
Case Management Note  Patient Details  Name: [name] MRN: [id number] Date of Birth: 08/10/1960  Subjective/Objective:                    Action/Plan:  10/30 Reached out to Dr Hilma Favors to discuss DC planning, no response.  10/31 12:00 Spoke w Dr Hilma Favors re DC planning. Per conversation, patient stable, independent PTA, does NOT need same day start of home health services.  Requested DME trach supplies, suction for home use order.  Patient and family to have trach teaching at bedside today prior to DC. Patient will DC via private car.   15:00 Patient wanting to leave. Per Durene Fruits [facility] they will not provide her trach supplies unless she stays overnight in the hospital. Clearly explained that patient is stable for DC and will not need care tonight, is safe for start of care tomorrow. Patient is refusing to stay overnight. Provided Dr Hilma Favors with number for [facility] clinical liaison, Butch Penny to discuss patient stability and start of care in AM. Received text from Dr Hilma Favors that she herself will assume responsibility for patient and get her trach supplies. Verified that patient is ok to DC.  Requested bedside RN to send her home with all shiley's at bedside, suction tubing, and extra split gauze for dressing changes.   Attempted other DME companies who could not provide supplies any sooner.   16:15 UPdated from Dr Hilma Favors, [facility] will provide patient with supplies tomorrow.   Expected Discharge Date:  05/31/18               Expected Discharge Plan:  [place]  In-House Referral:     Discharge planning Services  CM Consult  Post Acute Care Choice:  Home Health, Durable Medical Equipment Choice offered to:  Patient  DME Arranged:  Trach supplies, Suction DME Agency:  [place]:  RN [facility] Agency:  [place]  Status of Service:  Completed, signed off  If discussed at [place] [facility] of Stay Meetings, dates discussed:    Additional  Comments:  [name], RN 05/31/2018, 12:03 PM

## 2018-05-31 NOTE — Progress Notes (Signed)
Trach education provided to patient and daughter.  Patient able to demonstrate cleaning of the trach, suctioning, and changing of the trach ties.  Information booklet given, as well.  Also, a mirror was given, so she may see trach better while cleaning.  Advised patient to call 911 in the event of an emergency, or if trach becomes dislodged.  Patient stated that she felt comfortable with trach care.  Advised patient to have case management set up an appointment with trach clinic if needed.  Please call with any further questions.

## 2018-05-31 NOTE — Discharge Summary (Signed)
Physician Discharge Summary  Patient ID: [name] MRN: [id number] DOB/AGE: [birth year] 57 y.o.  Admit date: 05/25/2018 Discharge date: 06/04/2018  Admission Diagnoses: Excisional Biopsy of Laryngeal Mass with Placement of Tracheostomy Discharge Diagnoses:  Principal Problem:   Laryngeal cancer ([place]) Active Problems:   Tracheostomy in place ([place])   COPD, severe ([place])   Multiple vitamin deficiency (Vit D) and h/o B12 deficiency   Depression with anxiety   GERD   Therapeutic opioid induced constipation   Opioid dependence ([place])   Protein-calorie malnutrition, severe ([place])   Pain management contract signed   Chronic pain due to trauma   Hypokalemia   Hypomagnesemia   Cancer related pain   Discharged Condition: good  Hospital Course:  Adnmitted on 10/25 for elective surgical excision and biopsy of neck mass presumed to be malignant, by Dr. Janace Hoard, ENT who was the admitting physician. She tolerated surgery well and was changed to a cuffless trach on 10/30 and her care was transitioned to Palliative Care team who coordinated her care plan and pain management through out her hospitalization.On POD #3 she developed a fever of 102F with a change in the color of her suptum. She was treated for HCAP emipirically, cultures returned negative and MRSA PCR was negative. While NPO, her oral pain regimen was converted to a PCA and titrated for her comfort post-operatively. She required an equianalgesic dose for pain control equal to Morphine ER 200mg  BID and was transitioned to this regimen safely while in the hospital. She received fluid hydration, electrolyte replacement with K and Magnesium. Pathology returned Squamous Cell Carcinoma.She was cleared for oral intake with modified diet.  Consults: Pulmonary , Palliative Care  Significant Diagnostic Studies: CT Neck, Modified Barium Swallow, CXR  Goals of Care: DNR, Patient values her independence and autonomy, she is resistant to the idea of  any additional treatment for her cancer, she is willing to go have oncology consultation to inform her choices.  Treatments:   Tracheostomy Placed by Dr. Carmell Austria currently capped, education by trach team and home supplies ordered. Lurline Idol is for maintaining her airway in the event she undergoes radiation or cancer obstructs her airway. Patient had extensive Trach education by inpatient trach team.   Antibiotics: Discharged on Daily Azithromycin in setting of severe COPD and new tracheostomy, was treated with Vancomycin and Cefipime for 4 days for possible HCAP after developing fever of 102'F on HOD #3.   Pain Management: While NPO managed on Hydromorphone PCA and then converted to new oral regimen   Speech and Language Pathology: MBS, known hx of dysphagia, educated on aspiration precautions and modified diet  Discharge Exam: Blood pressure 128/84, pulse (!) 105, temperature 98.1 F (36.7 C), temperature source Oral, resp. rate 16, height 5\' 7"  (1.702 m), weight 46.1 kg, last menstrual period 09/29/2003, SpO2 95 %.  Patient AOX4, Appears comfortable and is ambulating in the room. Redness around trach site, trach is in place, red cap, yellow thick secretions from underside of trac opening, drain sponge is saturated. Lungs are clear, slight wheeze. Extremities are cool and dry, pulses are 2+ no swelleing or edema, she is very underweight and thin. Anxious affect.  Disposition:   Patient was discharged home in stable, good condition. Her daughter transported her.  [facility] will deliver Respiratory-Trach supplies 11/2, patient will be fine until next day  She has home O2, ok to use nasal cannula since her trach is capped.  [facility] to provide home health  Referral for Care Connections Program,  Cancer related pain and complex care coordination needs.  Dr. Douglass Rivers, Palliative Care, prescribed opioids on discharge and obtained prior authorization for 1 month supply.  Needs nutrition  counseling and support at home.  Next steps in her plan of care include seeing radiation oncology and the head and neck team for consultation.    Allergies as of 05/31/2018      Reactions   Aripiprazole Other (See Comments)   Suicidal ideation, also with SSRI   Chantix [varenicline] Other (See Comments)   Irritability and depressed mood   Pregabalin Hives   On trunk of body   Zolpidem Tartrate Other (See Comments)   UNKNOWN  Pt states she does not know about this allergy       Medication List    STOP taking these medications   amoxicillin-clavulanate 200-28.5 MG/5ML suspension Commonly known as:  AUGMENTIN   bisacodyl 5 MG EC tablet Commonly known as:  DULCOLAX   esomeprazole 40 MG capsule Commonly known as:  NEXIUM   FLUTTER Devi   megestrol 625 MG/5ML suspension Commonly known as:  MEGACE ES   Methylnaltrexone Bromide 150 MG Tabs     TAKE these medications   alprazolam 2 MG tablet Commonly known as:  XANAX Take 1 tablet (2 mg total) by mouth 3 (three) times daily as needed for anxiety. What changed:    medication strength  how much to take  how to take this  when to take this  reasons to take this  additional instructions   azithromycin 250 MG tablet Commonly known as:  ZITHROMAX Take 1 tablet (250 mg total) by mouth daily. What changed:    medication strength  how much to take  how to take this  when to take this  additional instructions   famotidine 20 MG tablet Commonly known as:  PEPCID Take 1 tablet (20 mg total) by mouth at bedtime.   HYDROmorphone 4 MG tablet Commonly known as:  DILAUDID Take 1 tablet (4 mg total) by mouth every 12 (twelve) hours as needed (BREAKTHROUGH PAIN).   ipratropium-albuterol 0.5-2.5 (3) MG/3ML Soln Commonly known as:  DUONEB Take 3 mLs by nebulization every 6 (six) hours as needed.   lactose free nutrition Liqd Take 237 mLs by mouth 3 (three) times daily with meals. What changed:    when to take  this  additional instructions   lubiprostone 24 MCG capsule Commonly known as:  AMITIZA Take 1 capsule (24 mcg total) by mouth 2 (two) times daily with a meal.   magnesium hydroxide 400 MG/5ML suspension Commonly known as:  MILK OF MAGNESIA Take 30 mLs by mouth daily.   Morphine Sulfate ER 200 MG Tbcr Take 200 mg by mouth every 12 (twelve) hours. What changed:    medication strength  how much to take  Another medication with the same name was removed. Continue taking this medication, and follow the directions you see here.   OXYGEN Inhale 3 L/min into the lungs See admin instructions. Continuous while sleeping and as needed throughout the day during physical activity   promethazine 25 MG tablet Commonly known as:  PHENERGAN TAKE 1 TABLET (25 MG TOTAL) BY MOUTH EVERY 8 (EIGHT) HOURS AS NEEDED FOR NAUSEA OR VOMITING.   SPIRIVA HANDIHALER 18 MCG inhalation capsule Generic drug:  tiotropium PLACE 1 CAPSULE INTO INHALER AND INHALE DAILY What changed:  Another medication with the same name was removed. Continue taking this medication, and follow the directions you see here.   SYMBICORT 160-4.5  MCG/ACT inhaler Generic drug:  budesonide-formoterol TAKE 2 PUFFS BY MOUTH TWICE A DAY What changed:  See the new instructions.      Follow-up Information    Eppie Gibson, MD. Go on 06/08/2018.   Specialty:  Radiation Oncology Why:  Appointment with Radiation Oncology- consultation only - arrive at 730AM on 06/08/2018 Contact information: [address] [address] [phone]           Signed: Lane Hacker 06/04/2018, 9:05 AM

## 2018-06-01 ENCOUNTER — Telehealth: Payer: Self-pay | Admitting: Internal Medicine

## 2018-06-01 ENCOUNTER — Telehealth: Payer: Self-pay | Admitting: *Deleted

## 2018-06-01 DIAGNOSIS — Z430 Encounter for attention to tracheostomy: Secondary | ICD-10-CM | POA: Diagnosis not present

## 2018-06-01 DIAGNOSIS — J439 Emphysema, unspecified: Secondary | ICD-10-CM | POA: Diagnosis not present

## 2018-06-01 DIAGNOSIS — M797 Fibromyalgia: Secondary | ICD-10-CM | POA: Diagnosis not present

## 2018-06-01 DIAGNOSIS — Z9181 History of falling: Secondary | ICD-10-CM | POA: Diagnosis not present

## 2018-06-01 DIAGNOSIS — C539 Malignant neoplasm of cervix uteri, unspecified: Secondary | ICD-10-CM | POA: Diagnosis not present

## 2018-06-01 DIAGNOSIS — Z7951 Long term (current) use of inhaled steroids: Secondary | ICD-10-CM | POA: Diagnosis not present

## 2018-06-01 DIAGNOSIS — G8929 Other chronic pain: Secondary | ICD-10-CM | POA: Diagnosis not present

## 2018-06-01 DIAGNOSIS — M810 Age-related osteoporosis without current pathological fracture: Secondary | ICD-10-CM | POA: Diagnosis not present

## 2018-06-01 DIAGNOSIS — R1313 Dysphagia, pharyngeal phase: Secondary | ICD-10-CM | POA: Diagnosis not present

## 2018-06-01 DIAGNOSIS — M549 Dorsalgia, unspecified: Secondary | ICD-10-CM | POA: Diagnosis not present

## 2018-06-01 DIAGNOSIS — K219 Gastro-esophageal reflux disease without esophagitis: Secondary | ICD-10-CM | POA: Diagnosis not present

## 2018-06-01 DIAGNOSIS — Z720 Tobacco use: Secondary | ICD-10-CM | POA: Diagnosis not present

## 2018-06-01 DIAGNOSIS — M1990 Unspecified osteoarthritis, unspecified site: Secondary | ICD-10-CM | POA: Diagnosis not present

## 2018-06-01 DIAGNOSIS — R918 Other nonspecific abnormal finding of lung field: Secondary | ICD-10-CM | POA: Diagnosis not present

## 2018-06-01 DIAGNOSIS — Z9981 Dependence on supplemental oxygen: Secondary | ICD-10-CM | POA: Diagnosis not present

## 2018-06-01 DIAGNOSIS — C321 Malignant neoplasm of supraglottis: Secondary | ICD-10-CM | POA: Diagnosis not present

## 2018-06-01 NOTE — Telephone Encounter (Signed)
Copied from [place] [id number]. Topic: Quick Communication - See Telephone Encounter >> Jun 01, 2018  1:17 PM Burchel, Abbi R wrote: CRM for notification. See Telephone encounter for: 06/01/18.  [place] requesting v/o for home health nursing 2x2wk and 1x3wk for new trache  [phone] ok to leave VM

## 2018-06-01 NOTE — Telephone Encounter (Signed)
Pt was on TCM report admitted 05/25/18 for tumor of larynx. Supraglottic mass- discussed procedure and tracheotomy with patient proceed with both to protect her airway through the treatment and need to make a diagnosis of the lung masses. Pt D/C 05/31/18, and will follow-up w/Squire, Judson Roch, MD (Radiation Oncology) on 06/08/2018.Marland KitchenJohny Chess

## 2018-06-04 ENCOUNTER — Other Ambulatory Visit: Payer: Self-pay | Admitting: Internal Medicine

## 2018-06-04 ENCOUNTER — Telehealth: Payer: Self-pay | Admitting: Internal Medicine

## 2018-06-04 DIAGNOSIS — T43225A Adverse effect of selective serotonin reuptake inhibitors, initial encounter: Secondary | ICD-10-CM | POA: Insufficient documentation

## 2018-06-04 DIAGNOSIS — C329 Malignant neoplasm of larynx, unspecified: Secondary | ICD-10-CM

## 2018-06-04 DIAGNOSIS — C321 Malignant neoplasm of supraglottis: Secondary | ICD-10-CM | POA: Diagnosis not present

## 2018-06-04 DIAGNOSIS — G893 Neoplasm related pain (acute) (chronic): Secondary | ICD-10-CM | POA: Diagnosis present

## 2018-06-04 DIAGNOSIS — R1313 Dysphagia, pharyngeal phase: Secondary | ICD-10-CM | POA: Diagnosis not present

## 2018-06-04 DIAGNOSIS — M797 Fibromyalgia: Secondary | ICD-10-CM | POA: Diagnosis not present

## 2018-06-04 DIAGNOSIS — Z430 Encounter for attention to tracheostomy: Secondary | ICD-10-CM | POA: Diagnosis not present

## 2018-06-04 DIAGNOSIS — R918 Other nonspecific abnormal finding of lung field: Secondary | ICD-10-CM | POA: Diagnosis not present

## 2018-06-04 DIAGNOSIS — J439 Emphysema, unspecified: Secondary | ICD-10-CM | POA: Diagnosis not present

## 2018-06-04 DIAGNOSIS — Z7951 Long term (current) use of inhaled steroids: Secondary | ICD-10-CM | POA: Diagnosis not present

## 2018-06-04 DIAGNOSIS — M810 Age-related osteoporosis without current pathological fracture: Secondary | ICD-10-CM | POA: Diagnosis not present

## 2018-06-04 DIAGNOSIS — Z9981 Dependence on supplemental oxygen: Secondary | ICD-10-CM | POA: Diagnosis not present

## 2018-06-04 DIAGNOSIS — Z720 Tobacco use: Secondary | ICD-10-CM | POA: Diagnosis not present

## 2018-06-04 MED ORDER — MORPHINE SULFATE 15 MG PO TABS: 15.0000 mg | ORAL_TABLET | Freq: Two times a day (BID) | ORAL | 0 refills | Status: DC | PRN

## 2018-06-04 NOTE — Progress Notes (Signed)
Discussed case with Radiation Oncology. Dr. Isidore Moos has requested Post-op CT to evaluate for radiation options.Will place order to expedite her follow up appt scheduled for 06/08/2018.

## 2018-06-04 NOTE — Telephone Encounter (Signed)
Forward to MD for FYI.Marland KitchenJohny Kaufman

## 2018-06-04 NOTE — Telephone Encounter (Signed)
Copied from [place] [id number]. Topic: General - Other >> Jun 04, 2018  1:08 PM Alfredia Ferguson R wrote: [name] from [facility] call in to advise staff and Dr Sharlet Salina that she done an evaluation on patient and she is refusing speech therapy and also refusing her thick liquids due to have a trache. She states that patient is wanting to drink her thin liquids and stated she will continue to drink them. Bethena Roys advised patient of risk and patient understood and still refusing  CB# [phone]

## 2018-06-04 NOTE — Progress Notes (Signed)
Prior authorization for MS Contin 200mg  PO BID pending. [employer] expedite PA. Change in dose. Will refill existing MS Contin 15mg  BID prn for breakthrough incase there is additional delay. Will wait to prescibe hydromorphone for breakthrough unless needed.  Lane Hacker, DO Palliative Medicine

## 2018-06-05 ENCOUNTER — Telehealth: Payer: Self-pay | Admitting: *Deleted

## 2018-06-05 ENCOUNTER — Other Ambulatory Visit: Payer: Self-pay | Admitting: Internal Medicine

## 2018-06-05 MED ORDER — MORPHINE SULFATE ER 100 MG PO TBCR: 100.0000 mg | EXTENDED_RELEASE_TABLET | Freq: Two times a day (BID) | ORAL | 0 refills | Status: DC

## 2018-06-05 NOTE — Telephone Encounter (Signed)
Ok

## 2018-06-05 NOTE — Telephone Encounter (Signed)
On 06-04-18 [name] call me said the pt wanted cancel appt for 06-08-18 and I told rick he said he would call the pt , he said keep her for 06-08-18

## 2018-06-05 NOTE — Progress Notes (Signed)
Despite requests for MS Contin 200 vs 100 the pharmacy will not fill the new prescription, insurance has approved this prior authorization. Patient now out of her opioid medication so will order previous dose until new dose can be approved.  Lane Hacker, DO Palliative Medicine

## 2018-06-05 NOTE — Telephone Encounter (Signed)
Notified shannon w/MD response.Marland KitchenJohny Chess

## 2018-06-06 ENCOUNTER — Other Ambulatory Visit: Payer: Self-pay

## 2018-06-06 DIAGNOSIS — J449 Chronic obstructive pulmonary disease, unspecified: Secondary | ICD-10-CM | POA: Diagnosis not present

## 2018-06-06 DIAGNOSIS — M549 Dorsalgia, unspecified: Secondary | ICD-10-CM | POA: Diagnosis not present

## 2018-06-06 DIAGNOSIS — M1990 Unspecified osteoarthritis, unspecified site: Secondary | ICD-10-CM | POA: Diagnosis not present

## 2018-06-06 DIAGNOSIS — Z9181 History of falling: Secondary | ICD-10-CM | POA: Diagnosis not present

## 2018-06-06 DIAGNOSIS — C321 Malignant neoplasm of supraglottis: Secondary | ICD-10-CM | POA: Diagnosis not present

## 2018-06-06 DIAGNOSIS — G8929 Other chronic pain: Secondary | ICD-10-CM | POA: Diagnosis not present

## 2018-06-06 DIAGNOSIS — J439 Emphysema, unspecified: Secondary | ICD-10-CM | POA: Diagnosis not present

## 2018-06-06 DIAGNOSIS — M810 Age-related osteoporosis without current pathological fracture: Secondary | ICD-10-CM | POA: Diagnosis not present

## 2018-06-06 DIAGNOSIS — Z7951 Long term (current) use of inhaled steroids: Secondary | ICD-10-CM | POA: Diagnosis not present

## 2018-06-06 DIAGNOSIS — R1313 Dysphagia, pharyngeal phase: Secondary | ICD-10-CM | POA: Diagnosis not present

## 2018-06-06 DIAGNOSIS — R918 Other nonspecific abnormal finding of lung field: Secondary | ICD-10-CM | POA: Diagnosis not present

## 2018-06-06 DIAGNOSIS — K219 Gastro-esophageal reflux disease without esophagitis: Secondary | ICD-10-CM | POA: Diagnosis not present

## 2018-06-06 DIAGNOSIS — M797 Fibromyalgia: Secondary | ICD-10-CM | POA: Diagnosis not present

## 2018-06-06 DIAGNOSIS — C539 Malignant neoplasm of cervix uteri, unspecified: Secondary | ICD-10-CM | POA: Diagnosis not present

## 2018-06-06 DIAGNOSIS — Z430 Encounter for attention to tracheostomy: Secondary | ICD-10-CM | POA: Diagnosis not present

## 2018-06-06 DIAGNOSIS — Z9981 Dependence on supplemental oxygen: Secondary | ICD-10-CM | POA: Diagnosis not present

## 2018-06-06 NOTE — Patient Outreach (Deleted)
[place] [facility]) Care Management  06/06/2018  [name] [facility] [birth year] [id number]  Subjective: "I am fine".  Assessment: 57 year old with history of RNCM called to follow up. Spoke with client  Client reports [facility] with [facility]. Has O2 at 3l/[place].   RNCM will call to reschedule follow up appointment RNCM called palliative care re: if Palliative care was following client.

## 2018-06-07 ENCOUNTER — Other Ambulatory Visit: Payer: Self-pay

## 2018-06-07 ENCOUNTER — Telehealth: Payer: Self-pay | Admitting: *Deleted

## 2018-06-07 NOTE — Patient Outreach (Signed)
[place] [facility]) Care Management  06/07/2018  Christena Sunderlin [facility] 05-Jul-1961 [id number]   RNCM called Palliative care of [place]-was informed that client is not active with their service.  RNCM called Radiation Oncology for clarification and spoke with Gayleen Orem. Mr. Gerald Dexter reports that client needs a CT of neck before the appointment with Dr. Isidore Moos can be arranged. He states he is working on this. He states he has spoken with client about this.  Plan: RNCM will follow up with Dr. Hilma Favors to clarify who is following up; palliative referral and plan of care.   Thea Silversmith, RN, MSN, [place] Coordinator Cell: [phone]

## 2018-06-07 NOTE — Telephone Encounter (Signed)
Oncology Nurse Navigator Documentation  Late Entry  Called Ms Marland in follow-up to her cancellation of 11/8 appt with Dr. Isidore Moos.  She explained she is unable to make early morning appts, prefers late morning/early afternoon.  I further explained referral received 10/31 from Dr. Hilma Favors per her hospitalization last week (included trach placement by ENT Melida Quitter).  I explained appt with Dr. Isidore Moos to be rescheduled upon completion of CT Neck for which pre-authorization is underway.  She voiced understanding.  I provided my contact information, encouraged her to call me with questions/concerns.  She voiced agreement.  Gayleen Orem, RN, BSN Head & Neck Oncology Nurse [place] at [place] [phone]

## 2018-06-07 NOTE — Patient Outreach (Signed)
[place] [facility]) Care Management  06/07/2018  [place] 04/28/1961 [id number]   Care Coordination: Per Ms. Asay, she will be following up with Dr Hilma Favors. RNCM called to clarify plan of care and who client is to follow up with. Also will clarify palliative referral.   Plan: continue to follow  Thea Silversmith, RN, MSN, [place] Coordinator Cell: [phone]

## 2018-06-07 NOTE — Telephone Encounter (Signed)
Oncology Nurse Navigator Documentation  Received call from Ephriam Knuckles, [facility] nurse coordinator re Ms. Siebenaler's post-hospitalization follow-up status.  I explained per call/referral from Dr. Hilma Favors last week Thursday, consult with Dr. Eppie Gibson, Radiation Oncology, to be rescheduled from 11/8 8:00 pending pre-authorization/completion of CT Neck ordered by Dr. Hilma Favors.  Further noted that in my conversation with Ms. Basara 11/5, she cancelled 11/8 appt b/c too early in the day, she needs late morning/early afternoon appts.  Mariann Laster indicated she was going to reach out to Dr. Hilma Favors for further clarification of plan of care, will call me with update.  Gayleen Orem, RN, BSN Head & Neck Oncology Nurse [place] at [place] [phone]

## 2018-06-07 NOTE — Patient Outreach (Signed)
[place] [facility]) Care Management  [place]  06/07/2018   [name] [facility] 03-24-1961 [id number]  Subjective: "I am fine"  Objective: telephonic assessment.  Encounter Medications:  Outpatient Encounter Medications as of 06/06/2018  Medication Sig  . ALPRAZolam (XANAX) 2 MG tablet Take 1 tablet (2 mg total) by mouth 3 (three) times daily as needed for anxiety.  Marland Kitchen azithromycin (ZITHROMAX) 250 MG tablet Take 1 tablet (250 mg total) by mouth daily.  . famotidine (PEPCID) 20 MG tablet Take 1 tablet (20 mg total) by mouth at bedtime.  Marland Kitchen ipratropium-albuterol (DUONEB) 0.5-2.5 (3) MG/3ML SOLN Take 3 mLs by nebulization every 6 (six) hours as needed.  . lactose free nutrition (BOOST) LIQD Take 237 mLs by mouth 3 (three) times daily with meals.  . lubiprostone (AMITIZA) 24 MCG capsule Take 1 capsule (24 mcg total) by mouth 2 (two) times daily with a meal.  . magnesium hydroxide (MILK OF MAGNESIA) 400 MG/5ML suspension Take 30 mLs by mouth daily.  Marland Kitchen morphine (MS CONTIN) 100 MG 12 hr tablet Take 1 tablet (100 mg total) by mouth every 12 (twelve) hours.  Marland Kitchen morphine (MSIR) 15 MG tablet Take 1 tablet (15 mg total) by mouth 2 (two) times daily as needed for severe pain.  . OXYGEN Inhale 3 L/min into the lungs See admin instructions. Continuous while sleeping and as needed throughout the day during physical activity  . promethazine (PHENERGAN) 25 MG tablet TAKE 1 TABLET (25 MG TOTAL) BY MOUTH EVERY 8 (EIGHT) HOURS AS NEEDED FOR NAUSEA OR VOMITING.  . SPIRIVA HANDIHALER 18 MCG inhalation capsule PLACE 1 CAPSULE INTO INHALER AND INHALE DAILY (Patient not taking: Reported on 05/15/2018)  . SYMBICORT 160-4.5 MCG/ACT inhaler TAKE 2 PUFFS BY MOUTH TWICE A DAY (Patient taking differently: Inhale 2 puffs into the lungs 2 (two) times daily. )   No facility-administered encounter medications on file as of 06/06/2018.     Functional Status:  In your present state of health, do you have any  difficulty performing the following activities: 06/06/2018 05/27/2018  Hearing? N N  Vision? N N  Difficulty concentrating or making decisions? N N  Walking or climbing stairs? N Y  Dressing or bathing? N N  Doing errands, shopping? Y -  Some recent data might be hidden    Fall/Depression Screening: Fall Risk  06/06/2018 07/15/2015 07/01/2014  Falls in the past year? 1 Yes No  Number falls in past yr: 1 1 -  Comment last episode was about 3-4 months ago states"I got dizzy" - -  Injury with Fall? 0 No -  Risk for fall due to : History of fall(s) - Medication side effect  Risk for fall due to: Comment - - -   PHQ 2/9 Scores 06/06/2018 04/16/2018 07/15/2015 07/01/2014 06/23/2014 06/13/2014 06/04/2014  PHQ - 2 Score 1 2 0 0 0 0 0  PHQ- 9 Score - 10 - - - - -    Assessment: 57 year old with history of COPD, GERD, Protein calorie malnutrition, opiod dependence. Client admitted 10/25-11/4 with laryngeal cancer, new tracheostomy. Primary care listed as completing transition of care.   RNCM called to follow up. Spoke with client. Two patient identifiers confirmed. Client reports she is doing well. She states she is actie with [facility] and they have started services. She reports O2 at 3l/[place].   Medications reviewed. Client denies any questions or issues.  Follow up appointments discussed. Per Notes-Palliative care service while in the hospital. Client  reports she has Dr. Cornelia Copa contact information and will follow up with her and as needed.  Per chart client has an appointment scheduled with Radiology oncology Eppie Gibson, but client states the appointment needs to be rescheduled. Client request RNCM follow up with Dr. Pearlie Oyster office to rescheduled office visit stating that 07:30am is too early. Client reports follow up appointment with primary care scheduled prior to hospitalization.  RNCM called palliative care to clarify if [place] is following client vs Care  Connections referring noted in discharge summary. Awaiting return call.  Plan: care coordination with providers. continue to follow.  Thea Silversmith, RN, MSN, [place] Coordinator Cell: [phone]

## 2018-06-08 ENCOUNTER — Ambulatory Visit
Admission: RE | Admit: 2018-06-08 | Discharge: 2018-06-08 | Disposition: A | Discharge status: Home / Self Care | Payer: Medicare Other | Source: Ambulatory Visit | Attending: Radiation Oncology | Admitting: Radiation Oncology

## 2018-06-08 ENCOUNTER — Ambulatory Visit: Payer: Medicare Other

## 2018-06-08 ENCOUNTER — Encounter: Payer: Self-pay | Admitting: *Deleted

## 2018-06-08 ENCOUNTER — Other Ambulatory Visit: Payer: Self-pay

## 2018-06-08 DIAGNOSIS — J439 Emphysema, unspecified: Secondary | ICD-10-CM | POA: Diagnosis not present

## 2018-06-08 DIAGNOSIS — K219 Gastro-esophageal reflux disease without esophagitis: Secondary | ICD-10-CM | POA: Diagnosis not present

## 2018-06-08 DIAGNOSIS — R1313 Dysphagia, pharyngeal phase: Secondary | ICD-10-CM | POA: Diagnosis not present

## 2018-06-08 DIAGNOSIS — Z7951 Long term (current) use of inhaled steroids: Secondary | ICD-10-CM | POA: Diagnosis not present

## 2018-06-08 DIAGNOSIS — M810 Age-related osteoporosis without current pathological fracture: Secondary | ICD-10-CM | POA: Diagnosis not present

## 2018-06-08 DIAGNOSIS — Z430 Encounter for attention to tracheostomy: Secondary | ICD-10-CM | POA: Diagnosis not present

## 2018-06-08 DIAGNOSIS — M549 Dorsalgia, unspecified: Secondary | ICD-10-CM | POA: Diagnosis not present

## 2018-06-08 DIAGNOSIS — C321 Malignant neoplasm of supraglottis: Secondary | ICD-10-CM | POA: Diagnosis not present

## 2018-06-08 DIAGNOSIS — Z9181 History of falling: Secondary | ICD-10-CM | POA: Diagnosis not present

## 2018-06-08 DIAGNOSIS — M1990 Unspecified osteoarthritis, unspecified site: Secondary | ICD-10-CM | POA: Diagnosis not present

## 2018-06-08 DIAGNOSIS — M797 Fibromyalgia: Secondary | ICD-10-CM | POA: Diagnosis not present

## 2018-06-08 DIAGNOSIS — G8929 Other chronic pain: Secondary | ICD-10-CM | POA: Diagnosis not present

## 2018-06-08 DIAGNOSIS — Z9981 Dependence on supplemental oxygen: Secondary | ICD-10-CM | POA: Diagnosis not present

## 2018-06-08 DIAGNOSIS — C539 Malignant neoplasm of cervix uteri, unspecified: Secondary | ICD-10-CM | POA: Diagnosis not present

## 2018-06-08 DIAGNOSIS — R918 Other nonspecific abnormal finding of lung field: Secondary | ICD-10-CM | POA: Diagnosis not present

## 2018-06-08 NOTE — Patient Outreach (Signed)
[place] [facility]) Care Management  06/08/2018  [place] 03/20/1961 [id number]   RNCM called to discuss follow up provider and clarify client's understanding of which doctors she is instructed to follow up with. Mrs. Underwood states she is going to be following up with Dr. Hilma Favors and not primary care or the surgeon. She is without complaints at this time and continues to be active with home health.  Plan: continue to follow.   Thea Silversmith, RN, MSN, [place] Coordinator Cell: [phone]

## 2018-06-08 NOTE — Progress Notes (Signed)
Oncology Nurse Navigator Documentation  Following call from Dr. Hilma Favors indicating Ms Rigdon had arrived for appt with Dr. Isidore Moos cancelled by Ms Sherlynn Stalls earlier this week, met with her to reinforce trach care education as she had brought suction equipment and supplies.  She provided verbal understanding/provided correct demonstration of inner cannula cleaning and suctioning.  She indicated she has received trach supplies and [facility] RN is planning to visit her 3 times weekly to provide trach support.  I again explained the next step in her plan of care is completion of CT Neck followed by consult with Dr. Isidore Moos both of which have yet to be scheduled.  I encouraged her to call me with questions pending these appointments, provided my business card.  She voiced understanding.  Gayleen Orem, RN, BSN Head & Neck Oncology Nurse [place] at [place] [phone]

## 2018-06-12 DIAGNOSIS — K219 Gastro-esophageal reflux disease without esophagitis: Secondary | ICD-10-CM | POA: Diagnosis not present

## 2018-06-12 DIAGNOSIS — Z9981 Dependence on supplemental oxygen: Secondary | ICD-10-CM | POA: Diagnosis not present

## 2018-06-12 DIAGNOSIS — M810 Age-related osteoporosis without current pathological fracture: Secondary | ICD-10-CM | POA: Diagnosis not present

## 2018-06-12 DIAGNOSIS — G8929 Other chronic pain: Secondary | ICD-10-CM | POA: Diagnosis not present

## 2018-06-12 DIAGNOSIS — Z430 Encounter for attention to tracheostomy: Secondary | ICD-10-CM | POA: Diagnosis not present

## 2018-06-12 DIAGNOSIS — M797 Fibromyalgia: Secondary | ICD-10-CM | POA: Diagnosis not present

## 2018-06-12 DIAGNOSIS — M1990 Unspecified osteoarthritis, unspecified site: Secondary | ICD-10-CM | POA: Diagnosis not present

## 2018-06-12 DIAGNOSIS — Z9181 History of falling: Secondary | ICD-10-CM | POA: Diagnosis not present

## 2018-06-12 DIAGNOSIS — M549 Dorsalgia, unspecified: Secondary | ICD-10-CM | POA: Diagnosis not present

## 2018-06-12 DIAGNOSIS — C321 Malignant neoplasm of supraglottis: Secondary | ICD-10-CM | POA: Diagnosis not present

## 2018-06-12 DIAGNOSIS — C539 Malignant neoplasm of cervix uteri, unspecified: Secondary | ICD-10-CM | POA: Diagnosis not present

## 2018-06-12 DIAGNOSIS — Z7951 Long term (current) use of inhaled steroids: Secondary | ICD-10-CM | POA: Diagnosis not present

## 2018-06-12 DIAGNOSIS — R918 Other nonspecific abnormal finding of lung field: Secondary | ICD-10-CM | POA: Diagnosis not present

## 2018-06-12 DIAGNOSIS — R1313 Dysphagia, pharyngeal phase: Secondary | ICD-10-CM | POA: Diagnosis not present

## 2018-06-12 DIAGNOSIS — J439 Emphysema, unspecified: Secondary | ICD-10-CM | POA: Diagnosis not present

## 2018-06-15 ENCOUNTER — Other Ambulatory Visit: Payer: Self-pay | Admitting: *Deleted

## 2018-06-15 ENCOUNTER — Telehealth: Payer: Self-pay | Admitting: *Deleted

## 2018-06-15 ENCOUNTER — Other Ambulatory Visit: Payer: Self-pay

## 2018-06-15 ENCOUNTER — Other Ambulatory Visit: Payer: Self-pay | Admitting: Radiation Oncology

## 2018-06-15 DIAGNOSIS — M797 Fibromyalgia: Secondary | ICD-10-CM | POA: Diagnosis not present

## 2018-06-15 DIAGNOSIS — G8929 Other chronic pain: Secondary | ICD-10-CM | POA: Diagnosis not present

## 2018-06-15 DIAGNOSIS — M1990 Unspecified osteoarthritis, unspecified site: Secondary | ICD-10-CM | POA: Diagnosis not present

## 2018-06-15 DIAGNOSIS — J439 Emphysema, unspecified: Secondary | ICD-10-CM | POA: Diagnosis not present

## 2018-06-15 DIAGNOSIS — Z430 Encounter for attention to tracheostomy: Secondary | ICD-10-CM | POA: Diagnosis not present

## 2018-06-15 DIAGNOSIS — M810 Age-related osteoporosis without current pathological fracture: Secondary | ICD-10-CM | POA: Diagnosis not present

## 2018-06-15 DIAGNOSIS — R1313 Dysphagia, pharyngeal phase: Secondary | ICD-10-CM | POA: Diagnosis not present

## 2018-06-15 DIAGNOSIS — Z9181 History of falling: Secondary | ICD-10-CM | POA: Diagnosis not present

## 2018-06-15 DIAGNOSIS — K219 Gastro-esophageal reflux disease without esophagitis: Secondary | ICD-10-CM | POA: Diagnosis not present

## 2018-06-15 DIAGNOSIS — C321 Malignant neoplasm of supraglottis: Secondary | ICD-10-CM | POA: Diagnosis not present

## 2018-06-15 DIAGNOSIS — Z7951 Long term (current) use of inhaled steroids: Secondary | ICD-10-CM | POA: Diagnosis not present

## 2018-06-15 DIAGNOSIS — Z9981 Dependence on supplemental oxygen: Secondary | ICD-10-CM | POA: Diagnosis not present

## 2018-06-15 DIAGNOSIS — R918 Other nonspecific abnormal finding of lung field: Secondary | ICD-10-CM | POA: Diagnosis not present

## 2018-06-15 DIAGNOSIS — C329 Malignant neoplasm of larynx, unspecified: Secondary | ICD-10-CM

## 2018-06-15 DIAGNOSIS — M549 Dorsalgia, unspecified: Secondary | ICD-10-CM | POA: Diagnosis not present

## 2018-06-15 DIAGNOSIS — C539 Malignant neoplasm of cervix uteri, unspecified: Secondary | ICD-10-CM | POA: Diagnosis not present

## 2018-06-15 NOTE — Telephone Encounter (Signed)
Oncology Nurse Navigator Documentation  Called [name], explained: 1) She will be contacted to schedule a PET as soon as insure approval has been rec'd.   2) Appts with Dr. Isidore Moos, RadOnc, and Dr. Maylon Peppers, MedOnc, will be scheduled when PET appt is determined. She voiced understanding. I encouraged her to call me with questions/concerns.  She agreed to do so.  Gayleen Orem, RN, BSN Head & Neck Oncology Nurse [place] at [place] [phone]

## 2018-06-15 NOTE — Patient Outreach (Signed)
[place] [facility]) Care Management  06/15/2018  [place] 1961-02-11 [id number]   RNCM called to follow up. Client reports contact with Dr. Hilma Favors post discharge. She also has been in contact with Dr. Pearlie Oyster team. [facility] offered to call to get an earlier appointment with primary care, but client declines. RNCM also discussed the possibility of follow up with Dr. Byers(ENT/surgeon), but client states she is not supposed to follow up with Dr Janace Hoard.  Per referral to RNCM-follow up with care connections palliative care program. RNCM discussed the care connections program. Client was not familiar with the program and is hesitant to participate. Client also does not want RNCM to try to re-schedule a sooner primary care appointment.  RNCM reached out to Dr. Hilma Favors for clarification last week with no return call.  Client is without questions or concerns at this time. She reports the home health nurse has been out to see her today.   Client had to get off the phone due to a call coming in from the cancer center. RNCM called back to attempt to schedule a home visit for next week, but no answer and unable to leave voice message.  Plan: RNCM will reach out to Dr. Hilma Favors for assistance/clarification of client's plan of care. Follow up with client next week. [facility] CM Care Plan Problem One     Most Recent Value  Care Plan Problem One  at risk for readmissition as evidence by recent admission  Role Documenting the Problem One  Care Management [place] for Problem One  Active  [facility] Long Term Goal   client will not be hospitalized within the next 31 days.  [facility] Long Term Goal Start Date  06/06/18  Interventions for Problem One Long Term Goal  discussed client's overall sense of well being, outreach to Dr. Hilma Favors.  [facility] CM Short Term Goal #1   client will attend follow up appointments as scheduled within the next 30 days.  [facility] CM Short Term Goal #1 Start Date  06/06/18   Interventions for Short Term Goal #1  discussed upcoming appointments.  THN CM Short Term Goal #2   client will take medications as prescribed within the nextt 30 days.  [facility]N CM Short Term Goal #2 Start Date  06/06/18  Interventions for Short Term Goal #2  RNCM reiterated the need to follow up with primary care provider.     Thea Silversmith, RN, MSN, [place] Coordinator Cell: [phone]

## 2018-06-18 ENCOUNTER — Telehealth: Payer: Self-pay | Admitting: *Deleted

## 2018-06-18 ENCOUNTER — Other Ambulatory Visit: Payer: Self-pay

## 2018-06-18 DIAGNOSIS — Z930 Tracheostomy status: Secondary | ICD-10-CM | POA: Diagnosis not present

## 2018-06-18 DIAGNOSIS — J449 Chronic obstructive pulmonary disease, unspecified: Secondary | ICD-10-CM | POA: Diagnosis not present

## 2018-06-18 DIAGNOSIS — R05 Cough: Secondary | ICD-10-CM | POA: Diagnosis not present

## 2018-06-18 DIAGNOSIS — C329 Malignant neoplasm of larynx, unspecified: Secondary | ICD-10-CM | POA: Diagnosis not present

## 2018-06-18 NOTE — Telephone Encounter (Signed)
Oncology Nurse Navigator Documentation  Spoke with Ms. Lorentz and her dtr to inform of 11/20 3:00 NE and 3:30 appt to see Dr. Isidore Moos, reviewed registration procedure.  They voiced understanding.    Gayleen Orem, RN, BSN Head & Neck Oncology Nurse [place] at [place] [phone]

## 2018-06-18 NOTE — Telephone Encounter (Signed)
Oncology Nurse Navigator Documentation  Spoke with Ms. Marcelino, informed her PET has been rescheduled from 11/26 to tomorrow [facility], 11/19 3:30 pm arrival for 4:00 PET.  I explained NPO status beginning 10:00 AM, advised her to not eat carbohydrates prior to 10:00. She voiced understanding of information provided.  LVMM for her dtr Constance Holster with above information.  Gayleen Orem, RN, BSN Head & Neck Oncology Nurse [place] at [place] [phone]

## 2018-06-18 NOTE — Progress Notes (Signed)
error 

## 2018-06-18 NOTE — Patient Outreach (Signed)
[place] [facility]) Care Management  06/18/2018  [place] [birth year] [id number]   RNCM received notification from Dr. Hilma Favors that she is following client. She request holding off on referral to care connections at this time. She request [facility] assist with the following: "help with PCS and any other resources for transportation..meal delivery etc..those are the things she really needs right now".  Plan: referral to [facility] social work.  Thea Silversmith, RN, MSN, [place] Coordinator Cell: [phone]

## 2018-06-18 NOTE — Patient Outreach (Signed)
[facility]) Care Management  06/18/2018  [place] 08-Aug-1960 [id number]   RNCM received call from Wynona Meals, Health and neck navigator with the information that client has an appointment for a PET  scan tomorrow at 4 pm; and at 3pm will go to Cancer center to meet Dr. Isidore Moos. He reports that she has an appointment November 27th apt. with medical oncology.  Plan: continue to follow.  Thea Silversmith, RN, MSN, [place] Coordinator Cell: [phone]

## 2018-06-18 NOTE — Telephone Encounter (Signed)
Called patient to inform of Pet Scan for 06-26-18 - arrival time - 3:30 pm @ [facility], pt. to be NPO- 6 hrs. prior to test, lvm for a return call

## 2018-06-19 ENCOUNTER — Encounter (HOSPITAL_COMMUNITY)
Admission: RE | Admit: 2018-06-19 | Discharge: 2018-06-19 | Disposition: A | Discharge status: Home / Self Care | Payer: Medicare Other | Source: Ambulatory Visit | Attending: Radiation Oncology | Admitting: Radiation Oncology

## 2018-06-19 ENCOUNTER — Encounter: Payer: Self-pay | Admitting: *Deleted

## 2018-06-19 DIAGNOSIS — M797 Fibromyalgia: Secondary | ICD-10-CM | POA: Diagnosis not present

## 2018-06-19 DIAGNOSIS — G8929 Other chronic pain: Secondary | ICD-10-CM | POA: Diagnosis not present

## 2018-06-19 DIAGNOSIS — Z430 Encounter for attention to tracheostomy: Secondary | ICD-10-CM | POA: Diagnosis not present

## 2018-06-19 DIAGNOSIS — Z7951 Long term (current) use of inhaled steroids: Secondary | ICD-10-CM | POA: Diagnosis not present

## 2018-06-19 DIAGNOSIS — C329 Malignant neoplasm of larynx, unspecified: Secondary | ICD-10-CM | POA: Diagnosis not present

## 2018-06-19 DIAGNOSIS — R1313 Dysphagia, pharyngeal phase: Secondary | ICD-10-CM | POA: Diagnosis not present

## 2018-06-19 DIAGNOSIS — M1990 Unspecified osteoarthritis, unspecified site: Secondary | ICD-10-CM | POA: Diagnosis not present

## 2018-06-19 DIAGNOSIS — J439 Emphysema, unspecified: Secondary | ICD-10-CM | POA: Diagnosis not present

## 2018-06-19 DIAGNOSIS — M549 Dorsalgia, unspecified: Secondary | ICD-10-CM | POA: Diagnosis not present

## 2018-06-19 DIAGNOSIS — C539 Malignant neoplasm of cervix uteri, unspecified: Secondary | ICD-10-CM | POA: Diagnosis not present

## 2018-06-19 DIAGNOSIS — Z9981 Dependence on supplemental oxygen: Secondary | ICD-10-CM | POA: Diagnosis not present

## 2018-06-19 DIAGNOSIS — R918 Other nonspecific abnormal finding of lung field: Secondary | ICD-10-CM | POA: Diagnosis not present

## 2018-06-19 DIAGNOSIS — M810 Age-related osteoporosis without current pathological fracture: Secondary | ICD-10-CM | POA: Diagnosis not present

## 2018-06-19 DIAGNOSIS — Z9181 History of falling: Secondary | ICD-10-CM | POA: Diagnosis not present

## 2018-06-19 DIAGNOSIS — C321 Malignant neoplasm of supraglottis: Secondary | ICD-10-CM | POA: Diagnosis not present

## 2018-06-19 DIAGNOSIS — K219 Gastro-esophageal reflux disease without esophagitis: Secondary | ICD-10-CM | POA: Diagnosis not present

## 2018-06-19 LAB — GLUCOSE, CAPILLARY: GLUCOSE-CAPILLARY: 98 mg/dL (ref 70–99)

## 2018-06-19 MED ORDER — FLUDEOXYGLUCOSE F - 18 (FDG) INJECTION
5.0600 | Freq: Once | INTRAVENOUS | Status: AC | PRN
  Administered 2018-06-19: 5.06 via INTRAVENOUS

## 2018-06-19 NOTE — Progress Notes (Signed)
Oncology Nurse Navigator Documentation  Met with [name] prior to her PET.  She was accompanied by her dtr [place]. Introduced myself to dtr, provided contact information. Provided copies of Epic appt calendar, emphasized 2:30 arrival tomorrow for 3:00 NE/3:30 Consult, explained registration procedure.  Gayleen Orem, RN, BSN Head & Neck Oncology Nurse [place] at [place] [phone]

## 2018-06-20 ENCOUNTER — Other Ambulatory Visit (HOSPITAL_COMMUNITY)
Admission: RE | Admit: 2018-06-20 | Discharge: 2018-06-20 | Disposition: A | Discharge status: Home / Self Care | Payer: Medicare Other | Source: Ambulatory Visit | Attending: Hematology | Admitting: Hematology

## 2018-06-20 ENCOUNTER — Ambulatory Visit: Payer: Medicare Other

## 2018-06-20 ENCOUNTER — Telehealth: Payer: Self-pay | Admitting: *Deleted

## 2018-06-20 ENCOUNTER — Ambulatory Visit
Admission: RE | Admit: 2018-06-20 | Discharge: 2018-06-20 | Disposition: A | Discharge status: Home / Self Care | Payer: Medicare Other | Source: Ambulatory Visit | Attending: Radiation Oncology | Admitting: Radiation Oncology

## 2018-06-20 ENCOUNTER — Ambulatory Visit: Payer: Medicare Other | Admitting: Hematology

## 2018-06-20 ENCOUNTER — Other Ambulatory Visit: Payer: Self-pay

## 2018-06-20 DIAGNOSIS — C321 Malignant neoplasm of supraglottis: Secondary | ICD-10-CM | POA: Insufficient documentation

## 2018-06-20 NOTE — Telephone Encounter (Signed)
Oncology Nurse Navigator Documentation  Called dtr to check on patient's well-being s/p yesterday's PET, intention re this afternoon's appt with Dr. Isidore Moos.   Dtr stated her mom is feeling very weak, discouraged, arriving for PET exhausted her.  Dtr had conversation with Dr. Rhea Pink, Palliative Medicine, after yesterday's PET, discussion included referral to hospice in context of present diagnosis, questionable ability to manage trips to [facility]/tolerate RT, other comorbidities.    Dtr agreed that I should cancel today's appt with Dr. Isidore Moos, next week's appt with Dr. Maylon Peppers.  Later received call from Dr. Hilma Favors.  I shared discussion from this morning's H&N Conference re tmt options of hypo-fractionated RT vs total laryngectomy.  She indicated she did not think either was viable considering [name]'s overall physical and mental condition.  She noted she will have further discussion with Dr. Isidore Moos.  Latoya Orem, RN, BSN Head & Neck Oncology Nurse [place] at [place] [phone]

## 2018-06-20 NOTE — Patient Outreach (Signed)
[facility]) Care Management  06/20/2018  [name] 1961/01/19 [id number]   Initial outreach to patient regarding social work referral received on 06/19/18 for assistance with personal care services, transportation, and meal delivery.  BSW explained reason for call.  Per Ms. Brocksmith, the PET completed yesterday indicated cancer in additional areas and she was informed that radiation would be ineffective.  She said that hospice services are being recommended at this time. BSW inquired about a referral for services but she was unsure of status.  Ms. Stofer requested that BSW communicate with her daughter, Zollie Beckers.  Ms. Sturdy requested that BSW call her tomorrow or Friday because she is very upset today due to the recent news.  BSW will outreach her daughter by the end of the week regarding status of hospice services.  Ronn Melena, BSW Social Worker [phone]

## 2018-06-21 ENCOUNTER — Encounter: Payer: Self-pay | Admitting: Internal Medicine

## 2018-06-21 ENCOUNTER — Other Ambulatory Visit: Payer: Self-pay

## 2018-06-21 ENCOUNTER — Telehealth: Payer: Self-pay

## 2018-06-21 NOTE — Telephone Encounter (Signed)
Palliative Medicine RN Note: Rec'd a call from [place], DO.   She reports that patient has had PET scan with very bad results. Dr Hilma Favors has ordered hospice through [facility] for Hassan Rowan based on patient choice and high likelihood of needing [facility] very soon.  I will reach out to [facility]. Dr Hilma Favors won't be able to access chart until later today.  Marjie Skiff Kashauna Celmer, RN, BSN, [facility] Palliative Medicine Team 06/21/2018 8:18 AM Office [phone]

## 2018-06-21 NOTE — Progress Notes (Signed)
Transition of Care Note Follow-up  Problem List: 1. 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 2. Severe COPD 3. Malnutrition, Weight Loss 4. Chronic Pain, Degenerative Cervical Spine 5. Chronic Constipation  I had a phone call with both [name] and her daughter [name] regarding patient's goals of care, prognosis and specifically the results of her PET scan yesterday. Patient has refused to go to Radiation Oncology appointment stating she does not want any radiation, chemotherapy or treatment for her cancer since it cannot be cured and she reports a very poor QOL at baseline. She also has advanced COPD, chronic pain and malnutrition. She has a tracheostomy in place from prior excisional biopsy of a neck mass. She is home bound, struggles with making appointments and managing her medication.  I have discussed the findings of her PET scan with the oncology team and results of tumor board discussion. She has a very poor prognosis and Hospice care would be appropriate.  She is agreeable to Hospice care and wants to be referred to [facility], anticipate need soon for a hospice facility and she is high risk for very difficult to manage EOL symptoms due to the location of her tumor.  I believe for her comfort she should leave her trach in place-otherwise airway obstruction could lead to severe suffering.  Will send referral to [place].  Prognosis: <4 months  I am happy to serve as [name]'s Hospice Attending.   Lane Hacker, DO Palliative Medicine [phone]

## 2018-06-21 NOTE — Patient Outreach (Signed)
[facility]) Care Management  06/21/2018  Jaquala Fuller [facility] Jan 26, 1961 [id number]   RNCM called to follow up. [name] reports that the Hospice nurse just left and she is now under Hospice Care. RNCM explained that [place] management would be closing out of case, as Hospice will be managing all of her care. Ms. Canty voiced understanding. She is without questions at this time.  Plan: close case.  Thea Silversmith, RN, MSN, [place] Coordinator Cell: [phone]

## 2018-06-22 ENCOUNTER — Other Ambulatory Visit: Payer: Self-pay

## 2018-06-22 ENCOUNTER — Other Ambulatory Visit: Payer: Self-pay | Admitting: Internal Medicine

## 2018-06-22 ENCOUNTER — Ambulatory Visit: Payer: Self-pay

## 2018-06-22 MED ORDER — HYDROMORPHONE HCL 4 MG PO TABS: 4.0000 mg | ORAL_TABLET | Freq: Three times a day (TID) | ORAL | 0 refills | Status: DC | PRN

## 2018-06-22 MED ORDER — MORPHINE SULFATE ER 100 MG PO TBCR: 100.0000 mg | EXTENDED_RELEASE_TABLET | Freq: Two times a day (BID) | ORAL | 0 refills | Status: DC

## 2018-06-22 NOTE — Patient Outreach (Signed)
[facility]) Care Management  06/22/2018  Arthur Aydelotte [facility] 02/17/1961 [id number]   THN BSW closing case because patient is now under Hospice care.   Ronn Melena, BSW Social Worker [phone]

## 2018-06-22 NOTE — Progress Notes (Signed)
Patient is now receiving hospice care.

## 2018-06-23 ENCOUNTER — Encounter: Payer: Self-pay | Admitting: Internal Medicine

## 2018-06-23 ENCOUNTER — Other Ambulatory Visit: Payer: Self-pay | Admitting: Internal Medicine

## 2018-06-23 MED ORDER — MORPHINE SULFATE ER 100 MG PO T12A: 100.0000 mg | EXTENDED_RELEASE_TABLET | Freq: Three times a day (TID) | ORAL | 0 refills | Status: DC

## 2018-06-23 MED ORDER — MAGNESIUM HYDROXIDE 400 MG/5ML PO SUSP: 30.0000 mL | Freq: Every day | ORAL | 0 refills | Status: DC

## 2018-06-23 MED ORDER — MAGNESIUM HYDROXIDE 400 MG/5ML PO SUSP: 30.0000 mL | Freq: Every day | ORAL | 0 refills | Status: AC

## 2018-06-23 MED ORDER — MORPHINE SULFATE ER 60 MG PO TBCR: 60.0000 mg | EXTENDED_RELEASE_TABLET | Freq: Three times a day (TID) | ORAL | 0 refills | Status: AC

## 2018-06-23 MED ORDER — HYDROMORPHONE HCL 4 MG PO TABS: 4.0000 mg | ORAL_TABLET | Freq: Three times a day (TID) | ORAL | 0 refills | Status: AC | PRN

## 2018-06-23 NOTE — Progress Notes (Signed)
[facility] [address] did not have MSContin in stock for >1 week. New script sent to [facility] per patient request.

## 2018-06-23 NOTE — Progress Notes (Signed)
Scripts sent to multiple retail pharmacies for MSContin 100mg - not in stock. [facility] on [address] had MS Contin 60mg  tabs- I have ordered these for TID and instructed her to take 1 tablet of 4mg  hydromorphone with the MSContin TID and if needed for increased pain she can take an additional hydromorphone every 8 hours prn-have sent in additional script for the hydromorphone. Confirmed receipt of medications and I have discussed administration in detail with patient's daughter who is the primary caregiver.  Lane Hacker, DO Palliative Medicine

## 2018-06-26 ENCOUNTER — Ambulatory Visit (HOSPITAL_COMMUNITY): Payer: Medicare Other

## 2018-06-27 ENCOUNTER — Ambulatory Visit: Payer: Medicare Other | Admitting: Hematology

## 2018-07-13 NOTE — Progress Notes (Deleted)
Subjective:   [name] is a 57 y.o. female who presents for an Initial Medicare Annual Wellness Visit.  Review of Systems    No ROS.  Medicare Wellness Visit. Additional risk factors are reflected in the social history.     Sleep patterns: {SX; SLEEP PATTERNS:18802::"feels rested on waking","does not get up to void","gets up *** times nightly to void","sleeps *** hours nightly"}.    Home Safety/Smoke Alarms: Feels safe in home. Smoke alarms in place.  Living environment; residence and Firearm Safety: {Rehab home environment / accessibility:30080::"no firearms","firearms stored safely"}. Seat Belt Safety/Bike Helmet: Wears seat belt.      Objective:    There were no vitals filed for this visit. There is no height or weight on file to calculate BMI.  Advanced Directives 05/26/2018 05/21/2018 07/15/2017 07/15/2015 07/03/2015 06/30/2015 02/28/2015  Does Patient Have a Medical Advance Directive? No No No Yes No No No  Would patient like information on creating a medical advance directive? No - Patient declined Yes (MAU/Ambulatory/Procedural Areas - Information given) No - Patient declined No - patient declined information No - patient declined information No - patient declined information -  Pre-existing out of facility DNR order (yellow form or pink MOST form) - - - - - - -    Current Medications (verified) Outpatient Encounter Medications as of 07/16/2018  Medication Sig  . ALPRAZolam (XANAX) 2 MG tablet Take 1 tablet (2 mg total) by mouth 3 (three) times daily as needed for anxiety.  Marland Kitchen azithromycin (ZITHROMAX) 250 MG tablet Take 1 tablet (250 mg total) by mouth daily.  . famotidine (PEPCID) 20 MG tablet Take 1 tablet (20 mg total) by mouth at bedtime.  Marland Kitchen HYDROmorphone (DILAUDID) 4 MG tablet Take 1 tablet (4 mg total) by mouth 3 (three) times daily as needed for severe pain.  Marland Kitchen ipratropium-albuterol (DUONEB) 0.5-2.5 (3) MG/3ML SOLN Take 3 mLs by nebulization every 6 (six)  hours as needed.  . lactose free nutrition (BOOST) LIQD Take 237 mLs by mouth 3 (three) times daily with meals.  . lubiprostone (AMITIZA) 24 MCG capsule Take 1 capsule (24 mcg total) by mouth 2 (two) times daily with a meal.  . magnesium hydroxide (MILK OF MAGNESIA) 400 MG/5ML suspension Take 30 mLs by mouth daily.  Marland Kitchen morphine (MS CONTIN) 60 MG 12 hr tablet Take 1 tablet (60 mg total) by mouth 3 (three) times daily.  . OXYGEN Inhale 3 L/min into the lungs See admin instructions. Continuous while sleeping and as needed throughout the day during physical activity  . promethazine (PHENERGAN) 25 MG tablet TAKE 1 TABLET (25 MG TOTAL) BY MOUTH EVERY 8 (EIGHT) HOURS AS NEEDED FOR NAUSEA OR VOMITING.  . SPIRIVA HANDIHALER 18 MCG inhalation capsule PLACE 1 CAPSULE INTO INHALER AND INHALE DAILY (Patient not taking: Reported on 05/15/2018)  . SYMBICORT 160-4.5 MCG/ACT inhaler TAKE 2 PUFFS BY MOUTH TWICE A DAY (Patient taking differently: Inhale 2 puffs into the lungs 2 (two) times daily. )   No facility-administered encounter medications on file as of 07/16/2018.     Allergies (verified) Aripiprazole; Chantix [varenicline]; Pregabalin; and Zolpidem tartrate   History: Past Medical History:  Diagnosis Date  . Anxiety   . Arthritis   . Cervical cancer ([place])    Discovered by pap smear, treated by Dr. Charlesetta Garibaldi with LEEP procedure.  Pap since that time has been normal.  . Chronic back pain     her CT of the head without contrast June 28, 2008- moderate to  large right paracentral disc protrusion at C4-C5 resulting in a right C5 neuroforaminal stenosis and probably some degree of spinal stenosis. C. bilateral C6 neuroforaminal stenosis related to  degenerative hypertrophy, no acute fracture or loose pieces identifying the cervical spine, ligamentous injury is not excluded.    Marland Kitchen COPD (chronic obstructive pulmonary disease) ([place])   . Depression   . Fibromyalgia   . GERD (gastroesophageal reflux disease)   October 2011    [place], esophagogram - tiny amount of gastroesophageal reflux otherwise unremarkable exam, done by Dr. Dellis Filbert  . Headache(784.0)   . Hemorrhoids   . Laryngeal mass   . Osteoporosis    T score L femur neck = -2.6 (02/21/12)   Past Surgical History:  Procedure Laterality Date  . ADENOIDECTOMY    . CATARACT EXTRACTION W/ INTRAOCULAR LENS  IMPLANT, BILATERAL    . CERVICAL CONE BIOPSY     leep procedure  . DIRECT LARYNGOSCOPY Bilateral 05/25/2018   Procedure: DIRECT LARYNGOSCOPY;  Surgeon: Melissa Montane, MD;  Location: [place];  Service: ENT;  Laterality: Bilateral;  with BX  . MULTIPLE EXTRACTIONS WITH ALVEOLOPLASTY  06/18/2012   Procedure: MULTIPLE EXTRACION WITH ALVEOLOPLASTY;  Surgeon: Gae Bon, DDS;  Location: [place];  Service: Oral Surgery;  Laterality: Bilateral;  . TONSILLECTOMY    . TRACHEOSTOMY TUBE PLACEMENT Bilateral 05/25/2018   Procedure: TRACHEOSTOMY;  Surgeon: Melissa Montane, MD;  Location: [facility] OR;  Service: ENT;  Laterality: Bilateral;   Family History  Problem Relation Age of Onset  . Stroke Mother   . Diabetes Mother   . Heart disease Father   . Lung cancer Brother        smoker   Social History   Socioeconomic History  . Marital status: Divorced    Spouse name: Not on file  . Number of children: 1  . Years of education: Not on file  . Highest education level: Not on file  Occupational History  . Occupation: disabled  Social Needs  . Financial resource strain: Not on file  . Food insecurity:    Worry: Not on file    Inability: Not on file  . Transportation needs:    Medical: Not on file    Non-medical: Not on file  Tobacco Use  . Smoking status: Current Every Day Smoker    Packs/day: 1.00    Years: 40.00    Pack years: 40.00    Types: Cigarettes  . Smokeless tobacco: Never Used  . Tobacco comment:  TRYING TO QUIT/ HAS CUT BACK  Substance and Sexual Activity  . Alcohol use: No    Alcohol/week: 0.0 standard drinks  . Drug use: No      Comment: Hx: drugs use ; none since 1995  . Sexual activity: Not Currently    Birth control/protection: None  Lifestyle  . Physical activity:    Days per week: Not on file    Minutes per session: Not on file  . Stress: Not on file  Relationships  . Social connections:    Talks on phone: Not on file    Gets together: Not on file    Attends religious service: Not on file    Active member of club or organization: Not on file    Attends meetings of clubs or organizations: Not on file    Relationship status: Not on file  Other Topics Concern  . Not on file  Social History Narrative   10th grade education. Married then divorced mid 30's. One daughter.  Crack cocaine about 1995 ish. One Mollie [place] admit 2001 for SI. Deemed disabled 04/01/2004 By [facility] due to documented health and mental issues.    Tobacco Counseling Ready to quit: Not Answered Counseling given: Not Answered Comment:  TRYING TO QUIT/ HAS CUT BACK  Activities of Daily Living In your present state of health, do you have any difficulty performing the following activities: 06/06/2018 05/27/2018  Hearing? N N  Vision? N N  Difficulty concentrating or making decisions? N N  Walking or climbing stairs? N Y  Dressing or bathing? N N  Doing errands, shopping? Y -  Some recent data might be hidden     Immunizations and Health Maintenance Immunization History  Administered Date(s) Administered  . Influenza Split 05/09/2012  . Influenza Whole 04/30/2008  . Influenza, High Dose Seasonal PF 04/16/2018  . Influenza,inj,Quad PF,6+ Mos 04/10/2013, 05/21/2014, 06/08/2016   Health Maintenance Due  Topic Date Due  . PNEUMOCOCCAL POLYSACCHARIDE VACCINE AGE 53-64 HIGH RISK  01/19/1961  . MAMMOGRAM  10/05/2016  . COLONOSCOPY  08/13/2017  . PAP SMEAR-Modifier  09/28/2017    Patient Care Team: Acquanetta Chain, DO as PCP - General (Internal Medicine)  Indicate any recent Medical Services you may have received from other  than [facility] providers in the past year (date may be approximate).     Assessment:   This is a routine wellness examination for Mirjana. Physical assessment deferred to PCP.   Hearing/Vision screen No exam data present  Dietary issues and exercise activities discussed:   Diet (meal preparation, eat out, water intake, caffeinated beverages, dairy products, fruits and vegetables): {Desc; diets:16563}  Goals   None    Depression Screen PHQ 2/9 Scores 06/06/2018 04/16/2018 07/15/2015 07/01/2014 06/23/2014 06/13/2014 06/04/2014  PHQ - 2 Score 1 2 0 0 0 0 0  PHQ- 9 Score - 10 - - - - -    Fall Risk Fall Risk  06/06/2018 07/15/2015 07/01/2014 06/23/2014 06/13/2014  Falls in the past year? 1 Yes No No No  Number falls in past yr: 1 1 - - -  Comment last episode was about 3-4 months ago states"I got dizzy" - - - -  Injury with Fall? 0 No - - -  Risk for fall due to : History of fall(s) - Medication side effect - -  Risk for fall due to: Comment - - - - -   Cognitive Function:        Screening Tests Health Maintenance  Topic Date Due  . PNEUMOCOCCAL POLYSACCHARIDE VACCINE AGE 53-64 HIGH RISK  01/19/1961  . MAMMOGRAM  10/05/2016  . COLONOSCOPY  08/13/2017  . PAP SMEAR-Modifier  09/28/2017  . TETANUS/TDAP  04/17/2019 (Originally 01/19/1978)  . INFLUENZA VACCINE  Completed  . Hepatitis C Screening  Completed  . HIV Screening  Completed     Plan:     I have personally reviewed and noted the following in the patient's chart:   . Medical and social history . Use of alcohol, tobacco or illicit drugs  . Current medications and supplements . Functional ability and status . Nutritional status . Physical activity . Advanced directives . List of other physicians . Vitals . Screenings to include cognitive, depression, and falls . Referrals and appointments  In addition, I have reviewed and discussed with patient certain preventive protocols, quality metrics, and best practice  recommendations. A written personalized care plan for preventive services as well as general preventive health recommendations were provided to patient.  Michiel Cowboy, RN   07/13/2018

## 2018-07-16 ENCOUNTER — Ambulatory Visit: Payer: Medicare Other

## 2018-07-16 ENCOUNTER — Ambulatory Visit: Payer: Medicare Other | Admitting: Internal Medicine

## 2018-08-01 DEATH — deceased

## 2018-08-24 ENCOUNTER — Other Ambulatory Visit (HOSPITAL_COMMUNITY): Payer: Self-pay | Admitting: Internal Medicine

## 2020-07-29 IMAGING — CT CT NECK W/ CM
4 of 6 series · 11 of 33 positions shown, 13 images · IV contrast (iopamidol)
Comparison: Head and cervical spine CT 06/28/2008. chest CT
09/19/2017, 03/09/2015.

CLINICAL DATA: 57-year-old female recently diagnosed with laryngeal
cancer. Sore throat.

EXAM:
CT NECK WITH CONTRAST
TECHNIQUE: Multidetector CT imaging of the neck was performed using the
standard protocol following the bolus administration of intravenous
contrast.
CONTRAST:  75mL 50LFA8-XCC IOPAMIDOL (50LFA8-XCC) INJECTION 61%

[Series 3: neck 2.00 br40 s3 ax st · axial · 0.35mm/px · z∈[-762,-668]mm · 2 of 142 slices shown, 3 images]
[im 48/142  soft-tissue]
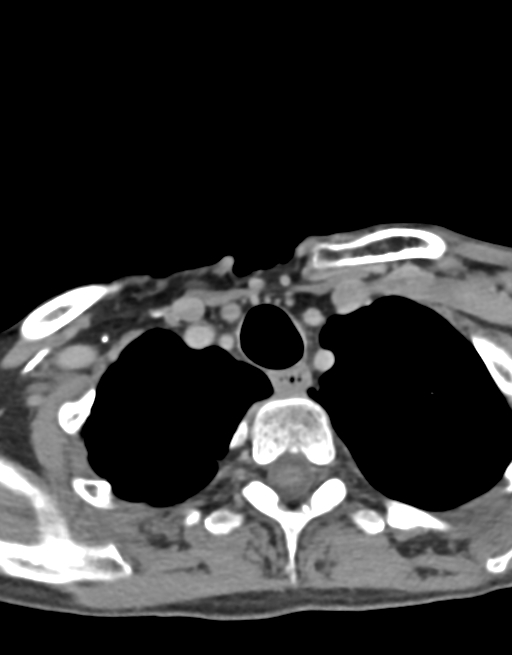
[im 48/142  bone]
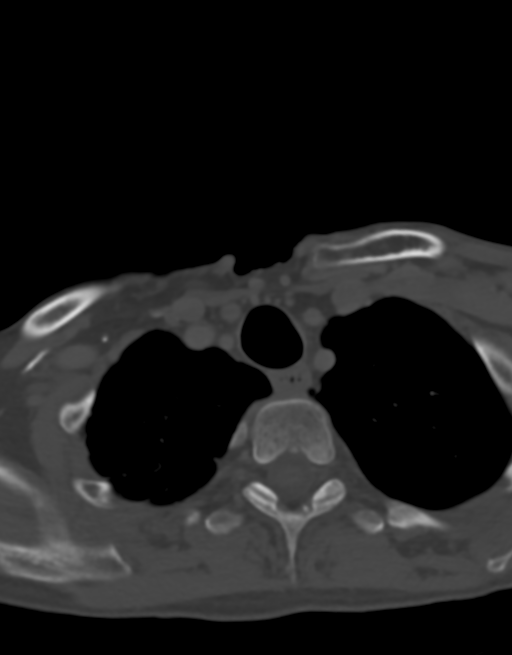
[im 95/142  bone]
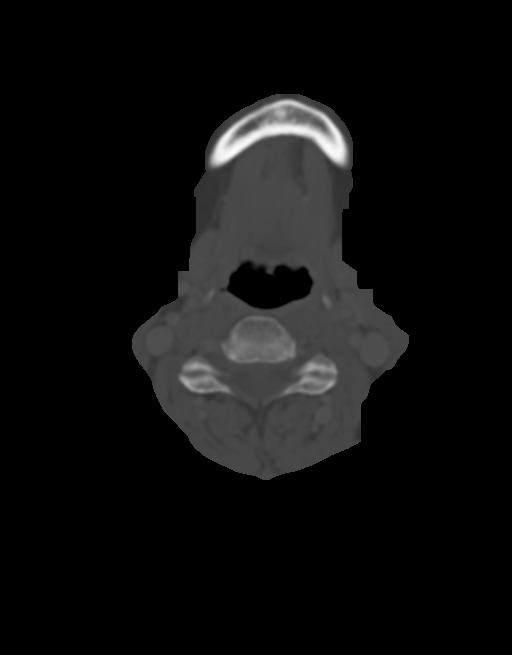

[Series 5: neck 2.00 br36 s3 ax hyoid · axial · 0.35mm/px · 1 of 143 slices shown]
[im 48/143  bone]
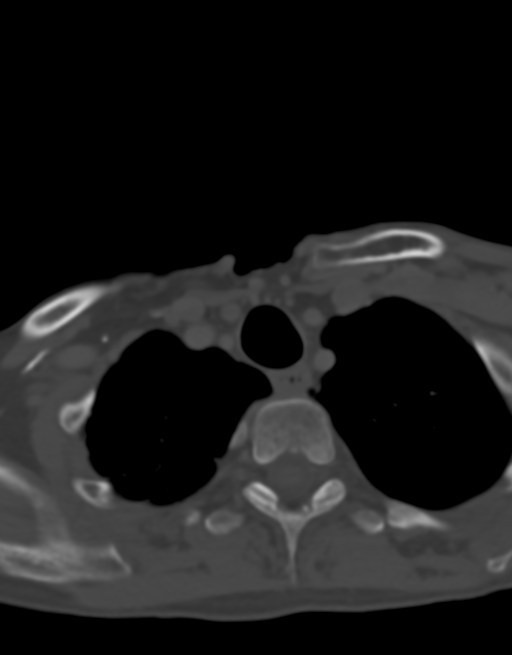

[Series 11: neck 2.00 br40 s3 (person_name) · coronal · 0.35mm/px · 3 of 114 slices shown]
[im 23/114  bone]
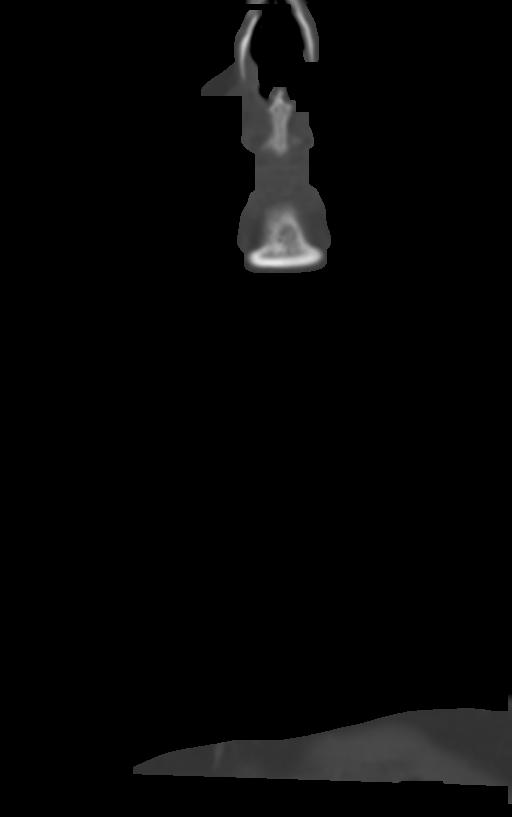
[im 46/114  bone]
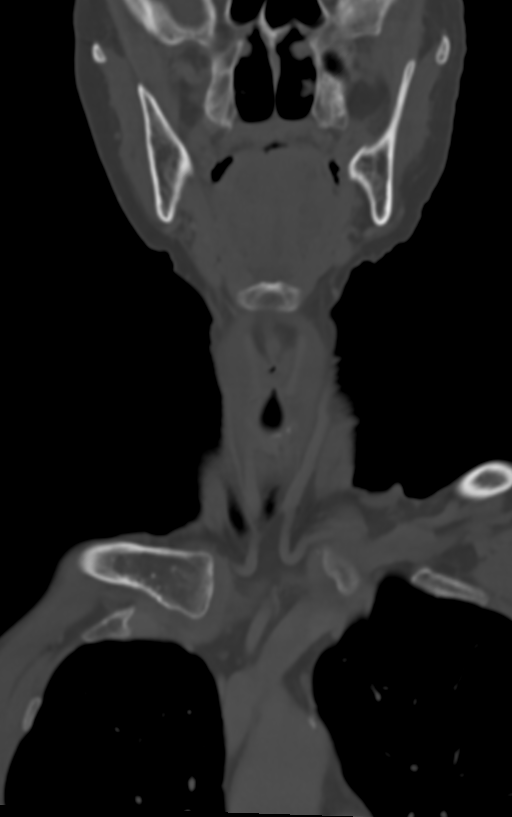
[im 68/114  bone]
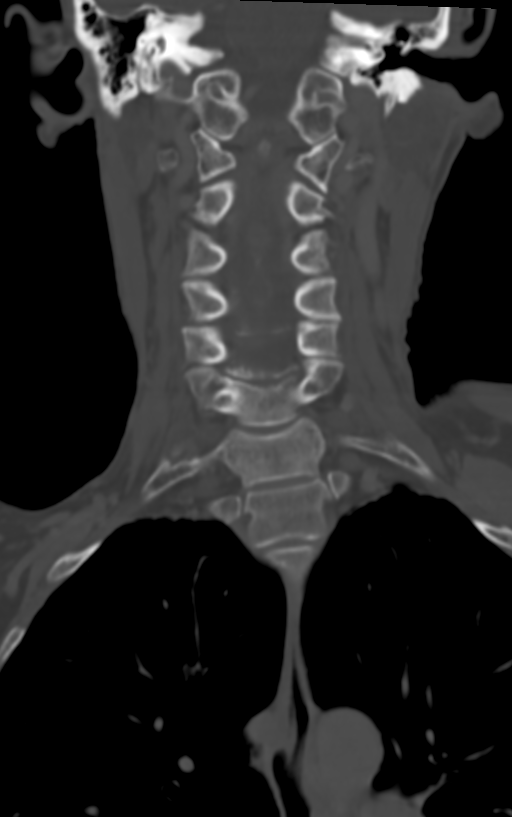

[Series 13: neck 2.00 br40 s3 sag st · sagittal · 0.45mm/px · 5 of 89 slices shown, 6 images]
[im 30/89  bone]
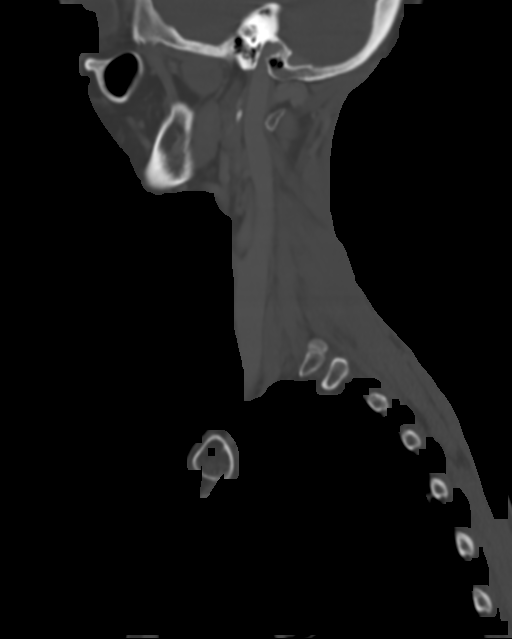
[im 37/89  bone]
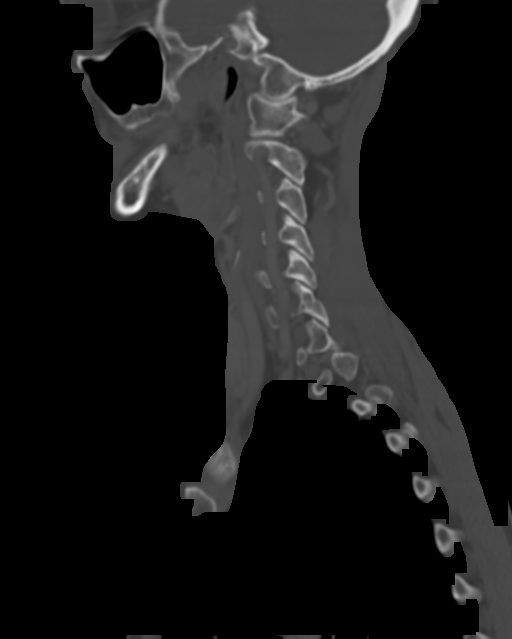
[im 45/89  soft-tissue]
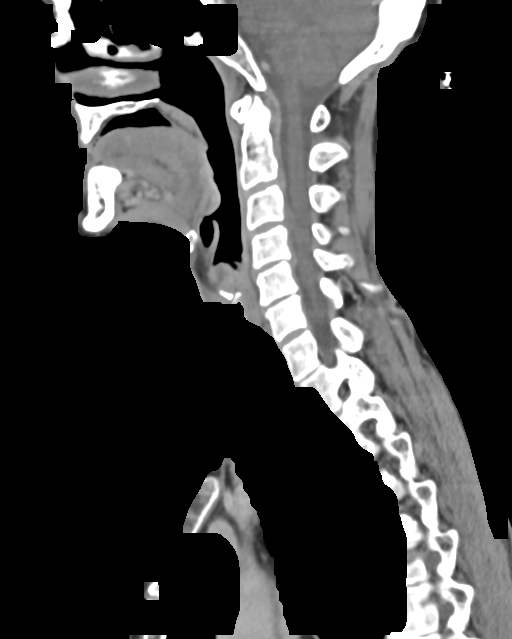
[im 45/89  bone]
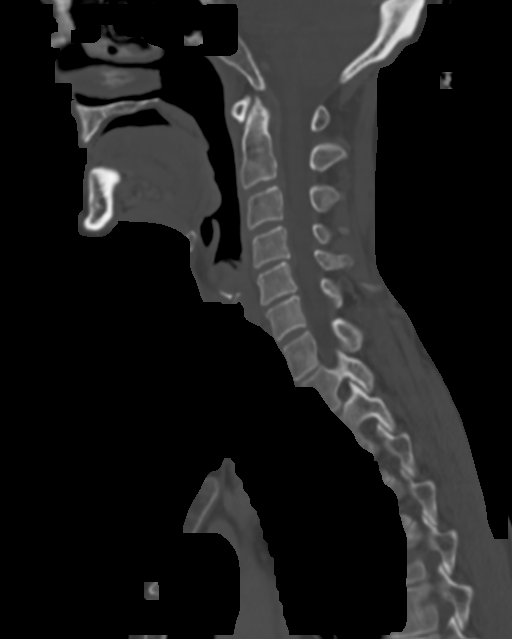
[im 52/89  bone]
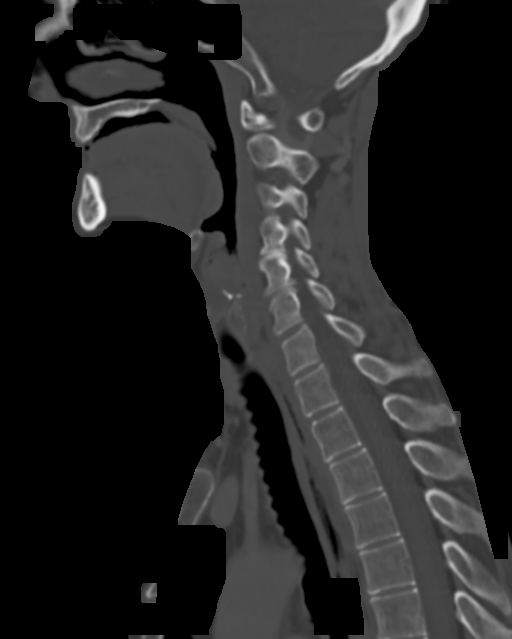
[im 59/89  bone]
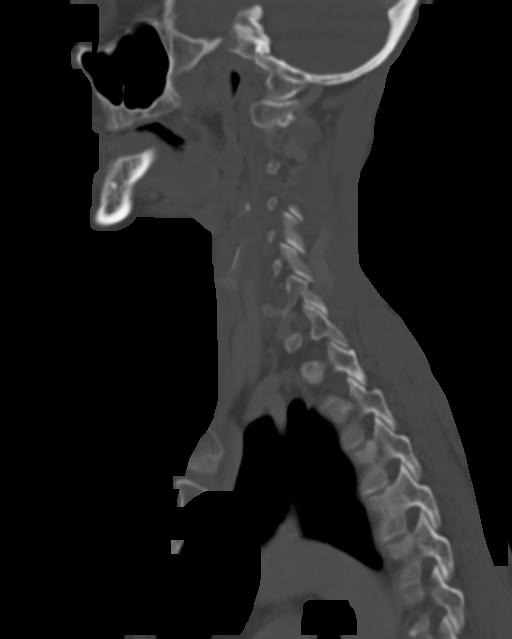

[11 of 33 positions shown; findings below may reference images not displayed]

FINDINGS: Pharynx and larynx: There is a small area of soft tissue thickening
internal to the cricoid ring posteriorly on the right (series 3,
image 75) is new since 2114.

Bulky supraglottic laryngeal soft tissue eccentric to the left,
inseparable from the left 80 fold. This encompasses 21 x 26 by 22
millimeters (AP by transverse by CC). Laryngeal cartilages appear to
remain normal. The epiglottis is probably spared.

Pharyngeal soft tissue contours remain normal. Negative
parapharyngeal and retropharyngeal spaces.

Salivary glands: Negative sublingual space. Negative submandibular
glands and parotid glands.

Thyroid: Negative.

Lymph nodes: A lymph node at the junction of the left level II a and
IIIa stations on series 3, image 47 has enlarged since 2114, is
asymmetric, and measures 9 millimeter short axis (2.1 centimeters
long axis). The node is not heterogeneous or necrotic.

The remaining bilateral neck lymph nodes appear stable and within
normal limits.

Vascular: Chronic left carotid bifurcation calcified
atherosclerosis. The major vascular structures in the neck and at
the skull base are patent.

Limited intracranial: Negative.

Visualized orbits: Postoperative changes to both globes, otherwise
negative.

Mastoids and visualized paranasal sinuses: Clear.

Skeleton: Absent dentition. Stable visible cervical spine. No acute
or suspicious osseous lesion.

Upper chest: Centrilobular emphysema. There is a 4-5 millimeter left
upper lobe lung nodule on series 9, image 46 which is new since
[REDACTED]. There is a new 3-4 millimeter nodule also in the left
upper lobe on image 49. Additionally, there is a new spiculated
right upper lobe lung nodule measuring 5-6 millimeters on image 50.
Furthermore, there are multiple additional new 3-4 millimeter right
lung nodules, including the 4 millimeter lesion in the superior
segment of the right lower lobe on image 63. Several other tiny new
2 millimeter lung nodules bilaterally, such as that on image 65 on
the left.

No superior mediastinal lymphadenopathy. Mild Calcified
atherosclerosis at the skull base.
IMPRESSION: 1. Left supraglottic mass compatible with laryngeal carcinoma
probably arising from the left AE fold measuring up to 2.6 cm.
Suspicious solitary 2.1 cm (long axis) lymph node at the left level
II/III junction, suggestive of nodal metastasis.
Additionally, note separate mild abnormal soft tissue thickening
internal to the cricoid ring on series 3, image 75.
2. Emphysema ([id number]-68P.2) with multiple new bilateral upper lung
pulmonary nodules since the CT in [REDACTED] are suspicious for
metastatic disease.
A disseminated infection, small septic emboli, might also have this
appearance but seems unlikely.
